# Patient Record
Sex: Female | Born: 1990 | Race: White | Hispanic: No | Marital: Married | Smoking: Former smoker
Health system: Southern US, Community
[De-identification: ages and names within clinical notes are randomized; demographics above are authoritative.]

## PROBLEM LIST (undated history)

## (undated) ENCOUNTER — Inpatient Hospital Stay (HOSPITAL_COMMUNITY): Payer: Self-pay

## (undated) DIAGNOSIS — E669 Obesity, unspecified: Secondary | ICD-10-CM

## (undated) DIAGNOSIS — E039 Hypothyroidism, unspecified: Secondary | ICD-10-CM

## (undated) DIAGNOSIS — F53 Postpartum depression: Secondary | ICD-10-CM

## (undated) DIAGNOSIS — O24419 Gestational diabetes mellitus in pregnancy, unspecified control: Secondary | ICD-10-CM

## (undated) DIAGNOSIS — Z720 Tobacco use: Secondary | ICD-10-CM

## (undated) DIAGNOSIS — R0600 Dyspnea, unspecified: Secondary | ICD-10-CM

## (undated) DIAGNOSIS — A4902 Methicillin resistant Staphylococcus aureus infection, unspecified site: Secondary | ICD-10-CM

## (undated) DIAGNOSIS — Z87898 Personal history of other specified conditions: Secondary | ICD-10-CM

## (undated) DIAGNOSIS — L709 Acne, unspecified: Secondary | ICD-10-CM

## (undated) DIAGNOSIS — D179 Benign lipomatous neoplasm, unspecified: Secondary | ICD-10-CM

## (undated) DIAGNOSIS — E119 Type 2 diabetes mellitus without complications: Secondary | ICD-10-CM

## (undated) DIAGNOSIS — F341 Dysthymic disorder: Secondary | ICD-10-CM

## (undated) DIAGNOSIS — K219 Gastro-esophageal reflux disease without esophagitis: Secondary | ICD-10-CM

## (undated) DIAGNOSIS — C569 Malignant neoplasm of unspecified ovary: Secondary | ICD-10-CM

## (undated) DIAGNOSIS — I10 Essential (primary) hypertension: Secondary | ICD-10-CM

## (undated) DIAGNOSIS — O99345 Other mental disorders complicating the puerperium: Secondary | ICD-10-CM

## (undated) DIAGNOSIS — N921 Excessive and frequent menstruation with irregular cycle: Secondary | ICD-10-CM

## (undated) DIAGNOSIS — C801 Malignant (primary) neoplasm, unspecified: Secondary | ICD-10-CM

## (undated) DIAGNOSIS — R002 Palpitations: Secondary | ICD-10-CM

## (undated) DIAGNOSIS — F419 Anxiety disorder, unspecified: Secondary | ICD-10-CM

## (undated) DIAGNOSIS — E282 Polycystic ovarian syndrome: Secondary | ICD-10-CM

## (undated) HISTORY — DX: Gastro-esophageal reflux disease without esophagitis: K21.9

## (undated) HISTORY — DX: Personal history of other specified conditions: Z87.898

## (undated) HISTORY — DX: Obesity, unspecified: E66.9

## (undated) HISTORY — PX: MOUTH SURGERY: SHX715

## (undated) HISTORY — DX: Acne, unspecified: L70.9

## (undated) HISTORY — DX: Postpartum depression: F53.0

## (undated) HISTORY — DX: Type 2 diabetes mellitus without complications: E11.9

## (undated) HISTORY — PX: TONSILLECTOMY: SUR1361

## (undated) HISTORY — DX: Other mental disorders complicating the puerperium: O99.345

## (undated) HISTORY — DX: Gestational diabetes mellitus in pregnancy, unspecified control: O24.419

## (undated) HISTORY — DX: Tobacco use: Z72.0

## (undated) HISTORY — DX: Dysthymic disorder: F34.1

## (undated) HISTORY — DX: Benign lipomatous neoplasm, unspecified: D17.9

## (undated) HISTORY — DX: Anxiety disorder, unspecified: F41.9

## (undated) HISTORY — DX: Excessive and frequent menstruation with irregular cycle: N92.1

## (undated) HISTORY — DX: Polycystic ovarian syndrome: E28.2

## (undated) HISTORY — DX: Palpitations: R00.2

---
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
## 2009-02-26 ENCOUNTER — Emergency Department (HOSPITAL_COMMUNITY): Admission: EM | Admit: 2009-02-26 | Discharge: 2009-02-26 | Payer: Self-pay | Admitting: Emergency Medicine

## 2009-09-11 ENCOUNTER — Inpatient Hospital Stay (HOSPITAL_COMMUNITY): Admission: AD | Admit: 2009-09-11 | Discharge: 2009-09-11 | Payer: Self-pay | Admitting: Family Medicine

## 2009-09-19 ENCOUNTER — Inpatient Hospital Stay (HOSPITAL_COMMUNITY): Admission: AD | Admit: 2009-09-19 | Discharge: 2009-09-19 | Payer: Self-pay | Admitting: Obstetrics & Gynecology

## 2009-09-19 ENCOUNTER — Ambulatory Visit: Payer: Self-pay | Admitting: Family Medicine

## 2009-09-20 ENCOUNTER — Ambulatory Visit: Payer: Self-pay | Admitting: Family

## 2009-09-20 ENCOUNTER — Inpatient Hospital Stay (HOSPITAL_COMMUNITY): Admission: AD | Admit: 2009-09-20 | Discharge: 2009-09-22 | Payer: Self-pay | Admitting: Obstetrics & Gynecology

## 2010-08-22 ENCOUNTER — Encounter (HOSPITAL_COMMUNITY): Payer: Self-pay

## 2010-08-22 ENCOUNTER — Emergency Department (HOSPITAL_COMMUNITY)
Admission: EM | Admit: 2010-08-22 | Discharge: 2010-08-23 | Disposition: A | Discharge status: Home / Self Care | Payer: BC Managed Care – PPO | Attending: Emergency Medicine | Admitting: Emergency Medicine

## 2010-08-22 ENCOUNTER — Emergency Department (HOSPITAL_COMMUNITY)
Admit: 2010-08-22 | Discharge: 2010-08-22 | Disposition: A | Discharge status: Home / Self Care | Payer: BC Managed Care – PPO

## 2010-08-22 ENCOUNTER — Encounter (INDEPENDENT_AMBULATORY_CARE_PROVIDER_SITE_OTHER): Payer: Self-pay | Admitting: *Deleted

## 2010-08-22 DIAGNOSIS — R42 Dizziness and giddiness: Secondary | ICD-10-CM | POA: Insufficient documentation

## 2010-08-22 DIAGNOSIS — R079 Chest pain, unspecified: Secondary | ICD-10-CM | POA: Insufficient documentation

## 2010-08-22 LAB — CONVERTED CEMR LAB
Basophils Absolute: 0 10*3/uL
Basophils Relative: 0 %
Creatinine, Ser: 0.69 mg/dL
GFR calc non Af Amer: >60 mL/min
Hemoglobin: 12.8 g/dL
Lymphocytes Relative: 29 %
Lymphs Abs: 2.1 10*3/uL
MCV: 86.8 fL
Monocytes Absolute: 0.8 10*3/uL
Monocytes Relative: 10 %
Neutro Abs: 4.3 10*3/uL
Potassium: 3.8 meq/L
RBC: 4.09 M/uL
RDW: 12.5 %
Troponin I: <0.05 ng/mL

## 2010-08-22 LAB — DIFFERENTIAL
Basophils Absolute: 0 10*3/uL (ref 0.0–0.1)
Eosinophils Absolute: 0.1 10*3/uL (ref 0.0–0.7)
Eosinophils Relative: 2 % (ref 0–5)
Neutro Abs: 4.3 10*3/uL (ref 1.7–7.7)
Neutrophils Relative %: 59 % (ref 43–77)

## 2010-08-22 LAB — POCT CARDIAC MARKERS
CKMB, poc: <1 ng/mL — ABNORMAL LOW (ref 1.0–8.0)
Troponin i, poc: 0.08 ng/mL (ref 0.00–0.09)

## 2010-08-22 LAB — BASIC METABOLIC PANEL
BUN: 11 mg/dL (ref 6–23)
CO2: 25 mEq/L (ref 19–32)
Creatinine, Ser: 0.69 mg/dL (ref 0.4–1.2)
GFR calc Af Amer: >60 mL/min (ref >60)
GFR calc non Af Amer: >60 mL/min (ref >60)
Glucose, Bld: 98 mg/dL (ref 70–99)
Potassium: 3.8 mEq/L (ref 3.5–5.1)
Sodium: 140 mEq/L (ref 135–145)

## 2010-08-22 LAB — CBC
MCHC: 36.1 g/dL — ABNORMAL HIGH (ref 30.0–36.0)
WBC: 7.3 10*3/uL (ref 4.0–10.5)

## 2010-09-12 ENCOUNTER — Encounter (INDEPENDENT_AMBULATORY_CARE_PROVIDER_SITE_OTHER): Payer: Self-pay | Admitting: *Deleted

## 2010-09-14 ENCOUNTER — Encounter: Payer: Self-pay | Admitting: Cardiology

## 2010-09-14 ENCOUNTER — Ambulatory Visit (INDEPENDENT_AMBULATORY_CARE_PROVIDER_SITE_OTHER): Payer: BC Managed Care – PPO | Admitting: Cardiology

## 2010-09-14 DIAGNOSIS — R002 Palpitations: Secondary | ICD-10-CM

## 2010-09-14 DIAGNOSIS — K219 Gastro-esophageal reflux disease without esophagitis: Secondary | ICD-10-CM | POA: Insufficient documentation

## 2010-09-14 DIAGNOSIS — R079 Chest pain, unspecified: Secondary | ICD-10-CM

## 2010-09-14 LAB — CONVERTED CEMR LAB: TSH: 1.726 microintl units/mL (ref 0.350–4.500)

## 2010-09-17 ENCOUNTER — Encounter: Payer: Self-pay | Admitting: Cardiology

## 2010-09-18 NOTE — Miscellaneous (Signed)
Summary: hospital labs 08/22/2010  Clinical Lists Changes  Observations: Added new observation of TROPONIN I: <0.05 (08/22/2010 15:22) Added new observation of CK-MB ISOENZ: <1.0 (08/22/2010 15:22) Added new observation of CALCIUM: 9.3 mg/dL (16/04/9603 54:09) Added new observation of GFR AA: >60 mL/min/1.75m2 (08/22/2010 15:22) Added new observation of GFR: >60 mL/min (08/22/2010 15:22) Added new observation of CREATININE: 0.69 mg/dL (81/19/1478 29:56) Added new observation of BUN: 11 mg/dL (21/30/8657 84:69) Added new observation of BG RANDOM: 98 mg/dL (62/95/2841 32:44) Added new observation of CO2 PLSM/SER: 25 meq/L (08/22/2010 15:22) Added new observation of CL SERUM: 107 meq/L (08/22/2010 15:22) Added new observation of K SERUM: 3.8 meq/L (08/22/2010 15:22) Added new observation of NA: 140 meq/L (08/22/2010 15:22) Added new observation of ABSOLUTE BAS: 0.0 K/uL (08/22/2010 15:22) Added new observation of BASOPHIL %: 0 % (08/22/2010 15:22) Added new observation of EOS ABSLT: 0.1 K/uL (08/22/2010 15:22) Added new observation of % EOS AUTO: 2 % (08/22/2010 15:22) Added new observation of ABSOLUTE MON: 0.8 K/uL (08/22/2010 15:22) Added new observation of MONOCYTE %: 10 % (08/22/2010 15:22) Added new observation of ABS LYMPHOCY: 2.1 K/uL (08/22/2010 15:22) Added new observation of LYMPHS %: 29 % (08/22/2010 15:22) Added new observation of ABS NEUTROPH: 4.3 K/uL (08/22/2010 15:22) Added new observation of PLATELETK/UL: 228 K/uL (08/22/2010 15:22) Added new observation of RDW: 12.5 % (08/22/2010 15:22) Added new observation of MCHC RBC: 36.1 g/dL (07/24/7251 66:44) Added new observation of MCV: 86.8 fL (08/22/2010 15:22) Added new observation of HCT: 35.5 % (08/22/2010 15:22) Added new observation of HGB: 12.8 g/dL (03/47/4259 56:38) Added new observation of RBC M/UL: 4.09 M/uL (08/22/2010 15:22) Added new observation of WBC COUNT: 7.3 10*3/microliter (08/22/2010 15:22)

## 2010-09-24 ENCOUNTER — Telehealth (INDEPENDENT_AMBULATORY_CARE_PROVIDER_SITE_OTHER): Payer: Self-pay | Admitting: *Deleted

## 2010-09-25 ENCOUNTER — Ambulatory Visit (HOSPITAL_COMMUNITY)
Admission: RE | Admit: 2010-09-25 | Discharge: 2010-09-25 | Disposition: A | Discharge status: Home / Self Care | Payer: BC Managed Care – PPO | Source: Ambulatory Visit | Attending: Cardiology | Admitting: Cardiology

## 2010-09-25 ENCOUNTER — Encounter (HOSPITAL_COMMUNITY): Payer: BC Managed Care – PPO

## 2010-09-25 DIAGNOSIS — R002 Palpitations: Secondary | ICD-10-CM

## 2010-09-25 DIAGNOSIS — R079 Chest pain, unspecified: Secondary | ICD-10-CM | POA: Insufficient documentation

## 2010-09-25 DIAGNOSIS — R072 Precordial pain: Secondary | ICD-10-CM

## 2010-09-26 ENCOUNTER — Ambulatory Visit (HOSPITAL_COMMUNITY)
Admission: RE | Admit: 2010-09-26 | Discharge: 2010-09-26 | Disposition: A | Discharge status: Home / Self Care | Payer: BC Managed Care – PPO | Source: Ambulatory Visit | Attending: Family Medicine | Admitting: Family Medicine

## 2010-09-26 DIAGNOSIS — R002 Palpitations: Secondary | ICD-10-CM | POA: Insufficient documentation

## 2010-09-26 DIAGNOSIS — R079 Chest pain, unspecified: Secondary | ICD-10-CM | POA: Insufficient documentation

## 2010-09-27 NOTE — Assessment & Plan Note (Signed)
Summary: [facility]   Visit Type:  Initial Consult Referring Provider:  [facility] Primary Provider:  Dr. [name]   History of Present Illness: [name] is seen at the kind request of Dr. [name] for the evaluation of chest discomfort and palpitations. This nice young woman delivered a child one year ago with no difficulties or complications.  Over the past few months, she has noted episodes of chest tightness associated with palpitations.  On one occasion, she measured a pulse rate of 174 beats per minute.  She notes mild chronic dyspnea on exertion and minimal ankle edema.  Current Medications (verified): 1)  Sodium Bicarbonate 325 Mg Tabs (Sodium Bicarbonate) .... Take 1 Tab Two Times A Day 2)  Omeprazole 20 Mg Cpdr (Omeprazole) .... 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  Past History:  Family History: Last updated: 09/12/2010 Father:unknown Mother:unknown patient was adopted  Social History: Last updated: 09/14/2010 Single with a 6-year-old child Employment-CNA at [employer] Tobacco Use - 5 pack years; essentially discontinued in 2011  Alcohol Use - no Regular Exercise - no Drug Use - no  Past Medical History: Chest pain Palpitations Tobacco abuse-5 pack years; essentially discontinued one year ago; occasionally smokes part of one cigarette Gastroesophageal reflux disease Dysthymic disorder-overdose at age 41 Overweight Acne  Past Surgical History: Tonsillectomy-1998 Skin graft-2009  EKG  Procedure date:  09/14/2010  Findings:      Normal sinus rhythm Delayed R-wave progression Prominent QRS voltage Otherwise unremarkable.   Social History: Single with a 74-year-old child Employment-CNA at [employer] Tobacco Use - 5 pack years; essentially discontinued in 2011  Alcohol Use - no Regular Exercise  - no Drug Use - no  Review of Systems       occasional dizziness; history of gastroesophageal reflux disease symptoms; progesterone implant for contraception; intermittent mild edema.  All other systems reviewed and are negative.  Vital Signs:  Patient profile:   20 year old female Height:      64 inches Weight:      200 pounds BMI:     34.45 Pulse rate:   99 / minute Pulse (ortho):   93 / minute BP sitting:   160 / 77  (left arm) BP standing:   138 / 81  Vitals Entered By: Dreama Saa, CNA (September 14, 2010 1:34 PM)  Serial Vital Signs/Assessments:  Time      Position  BP       Pulse  Resp  Temp     By 2:27 PM   Lying LA  149/82   83                    Lynn Via LPN 0:45 PM   Sitting   131/76   93                    Lynn Via LPN 4:09 PM   Standing  138/81   93                    Lynn Via LPN  Comments: 8:11 PM no complaints By: Larita Fife Via LPN    Physical Exam  General:  Obese Fat; well-developed; no acute distress: HEENT-[place]/AT; PERRL; EOM intact; conjunctiva and lids nl:  Neck-No JVD; no carotid bruits: Endocrine-No thyromegaly: Lungs-No tachypnea, clear without rales, rhonchi or wheezes:  CV-normal PMI; normal S1 and S2:;  Abdomen-BS normal; soft and non-tender without masses or organomegaly: MS-No deformities, cyanosis or clubbing: Neurologic-Nl cranial nerves; symmetric strength and tone: Skin- Warm, no sig. lesions: Extremities-Nl distal pulses; no edema    Impression & Recommendations:  Problem # 1:  CHEST PAIN (ICD-786.50) Likelihood of ischemic heart disease is remote.  We will initially evaluate for possible arrhythmia causing her chest discomfort.  Problem # 2:  PALPITATIONS (ICD-785.1) Patient reports that episodes occur daily.  A 2-day Holter monitor will be obtained.  Problem # 3:  VERTIGO (ICD-780.4) What patient describes as dizziness sounds most like vertigo.  These symptoms are mild at present, and do not require additional testing.  Other  Orders: Holter Monitor (Holter Monitor) 2-D Echocardiogram (2D Echo) T-TSH ([id number])  Patient Instructions: 1)  Your physician recommends that you schedule a follow-up appointment in: 2 weeks 2)  Your physician recommends that you return for lab work in: Today 3)  Your physician has requested that you have an echocardiogram.  Echocardiography is a painless test that uses sound waves to create images of your heart. It provides your doctor with information about the size and shape of your heart and how well your heart's chambers and valves are working.  This procedure takes approximately one hour. There are no restrictions for this procedure. 4)  Your physician has recommended that you wear a holter monitor.  Holter monitors are medical devices that record the heart's electrical activity. Doctors most often use these monitors to diagnose arrhythmias. Arrhythmias are problems with the speed or rhythm of the heartbeat. The monitor is a small, portable device. You can wear one while you do your normal daily activities. This is usually used to diagnose what is causing palpitations/syncope (passing out).

## 2010-09-28 ENCOUNTER — Encounter: Payer: Self-pay | Admitting: Cardiology

## 2010-10-02 NOTE — Progress Notes (Signed)
Summary: lab results  Phone Note Call from Patient Call back at Home Phone [phone]   Caller: pt Reason for Call: Talk to Nurse Summary of Call: pt calling for labs results done a week ago that she has not hear back on. Initial call taken by: Faythe Ghee,  September 24, 2010 11:36 AM  Follow-up for Phone Call        results of tsh given to pt, verbalized understanding Follow-up by: Teressa Lower RN,  September 24, 2010 12:00 PM

## 2010-10-02 NOTE — Procedures (Addendum)
Summary: Holter and Event  Holter and Event   Imported By: Faythe Ghee 09/28/2010 08:49:35  _____________________________________________________________________  External Attachment:    Type:   Image     Comment:   External Document

## 2010-10-05 ENCOUNTER — Ambulatory Visit: Payer: BC Managed Care – PPO | Admitting: Cardiology

## 2010-10-15 LAB — CBC
HCT: 30.2 % — ABNORMAL LOW (ref 36.0–46.0)
HCT: 36.3 % (ref 36.0–46.0)
MCV: 91 fL (ref 78.0–100.0)
RBC: 3.99 MIL/uL (ref 3.87–5.11)
RDW: 12.4 % (ref 11.5–15.5)

## 2010-10-24 ENCOUNTER — Telehealth: Payer: Self-pay | Admitting: Cardiology

## 2010-10-24 ENCOUNTER — Encounter: Payer: Self-pay | Admitting: Cardiology

## 2010-10-24 ENCOUNTER — Ambulatory Visit: Payer: BC Managed Care – PPO | Admitting: Cardiology

## 2010-10-24 NOTE — Telephone Encounter (Signed)
PT WANT TO KNOW IF WE CAN SEND HER RESULTS TO PCP DOC TO GO OVER WITH HER INSTEAD OF COMING HERE. SHE GOES TO DR Gerda Diss

## 2010-10-25 NOTE — Telephone Encounter (Signed)
Sent all information to Dr. [name] , pt prefers that he give her the results;ie, echo and holter monitor

## 2010-10-25 NOTE — Telephone Encounter (Signed)
Faxed results of holter and echo to Dr. [name], pt requests that he give her the results

## 2010-10-27 LAB — URINALYSIS, ROUTINE W REFLEX MICROSCOPIC
Bilirubin Urine: NEGATIVE
Glucose, UA: NEGATIVE mg/dL
Ketones, ur: NEGATIVE mg/dL
Nitrite: NEGATIVE
Specific Gravity, Urine: >1.03 — ABNORMAL HIGH (ref 1.005–1.030)
Urobilinogen, UA: 0.2 mg/dL (ref 0.0–1.0)
pH: 6 (ref 5.0–8.0)

## 2010-10-29 ENCOUNTER — Telehealth: Payer: Self-pay | Admitting: *Deleted

## 2010-10-29 NOTE — Telephone Encounter (Signed)
Faxed test results to Dr. Fletcher Anon office

## 2010-11-01 ENCOUNTER — Telehealth: Payer: Self-pay | Admitting: *Deleted

## 2010-11-01 NOTE — Telephone Encounter (Signed)
Spoke with Dr. Dietrich Pates, faxed all reports to Dr. Fletcher Anon office

## 2010-11-14 ENCOUNTER — Emergency Department (HOSPITAL_COMMUNITY)
Admission: EM | Admit: 2010-11-14 | Discharge: 2010-11-14 | Disposition: A | Discharge status: Home / Self Care | Payer: BC Managed Care – PPO | Attending: Emergency Medicine | Admitting: Emergency Medicine

## 2010-11-14 DIAGNOSIS — R079 Chest pain, unspecified: Secondary | ICD-10-CM | POA: Insufficient documentation

## 2010-11-14 DIAGNOSIS — R509 Fever, unspecified: Secondary | ICD-10-CM | POA: Insufficient documentation

## 2010-11-14 DIAGNOSIS — IMO0001 Reserved for inherently not codable concepts without codable children: Secondary | ICD-10-CM | POA: Insufficient documentation

## 2010-11-14 DIAGNOSIS — R0989 Other specified symptoms and signs involving the circulatory and respiratory systems: Secondary | ICD-10-CM | POA: Insufficient documentation

## 2010-11-14 DIAGNOSIS — J111 Influenza due to unidentified influenza virus with other respiratory manifestations: Secondary | ICD-10-CM | POA: Insufficient documentation

## 2010-11-14 DIAGNOSIS — R070 Pain in throat: Secondary | ICD-10-CM | POA: Insufficient documentation

## 2010-11-14 DIAGNOSIS — K644 Residual hemorrhoidal skin tags: Secondary | ICD-10-CM | POA: Insufficient documentation

## 2010-11-14 DIAGNOSIS — J3489 Other specified disorders of nose and nasal sinuses: Secondary | ICD-10-CM | POA: Insufficient documentation

## 2010-11-14 DIAGNOSIS — R0609 Other forms of dyspnea: Secondary | ICD-10-CM | POA: Insufficient documentation
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
## 2011-03-29 ENCOUNTER — Other Ambulatory Visit: Payer: Self-pay | Admitting: Family Medicine

## 2011-03-30 ENCOUNTER — Ambulatory Visit (HOSPITAL_COMMUNITY)
Admission: RE | Admit: 2011-03-30 | Discharge: 2011-03-30 | Disposition: A | Discharge status: Home / Self Care | Payer: BC Managed Care – PPO | Source: Ambulatory Visit | Attending: Family Medicine | Admitting: Family Medicine

## 2011-03-30 DIAGNOSIS — R109 Unspecified abdominal pain: Secondary | ICD-10-CM | POA: Insufficient documentation

## 2011-04-03 ENCOUNTER — Other Ambulatory Visit: Payer: Self-pay | Admitting: Family Medicine

## 2011-04-08 ENCOUNTER — Encounter (HOSPITAL_COMMUNITY): Payer: BC Managed Care – PPO

## 2011-05-02 LAB — BASIC METABOLIC PANEL
BUN: 3 — ABNORMAL LOW
Calcium: 9.2
Creatinine, Ser: 0.66
Glucose, Bld: 117 — ABNORMAL HIGH
Potassium: 3.9

## 2011-05-02 LAB — URINE MICROSCOPIC-ADD ON

## 2011-05-02 LAB — URINALYSIS, ROUTINE W REFLEX MICROSCOPIC
Bilirubin Urine: NEGATIVE
Glucose, UA: NEGATIVE
Hgb urine dipstick: NEGATIVE
Ketones, ur: NEGATIVE
pH: 7

## 2011-05-02 LAB — CBC
Hemoglobin: 12.7
MCV: 85
RBC: 4.34
RDW: 12.4

## 2011-05-02 LAB — RAPID URINE DRUG SCREEN, HOSP PERFORMED
Barbiturates: NOT DETECTED
Benzodiazepines: NOT DETECTED
Cocaine: NOT DETECTED

## 2011-05-02 LAB — DIFFERENTIAL
Basophils Relative: 1
Eosinophils Relative: 2
Lymphocytes Relative: 31
Lymphs Abs: 3.3

## 2011-05-02 LAB — SALICYLATE LEVEL: Salicylate Lvl: <4

## 2011-05-02 LAB — ACETAMINOPHEN LEVEL: Acetaminophen (Tylenol), Serum: <10 — ABNORMAL LOW

## 2011-05-02 LAB — ETHANOL: Alcohol, Ethyl (B): <5

## 2011-10-15 ENCOUNTER — Ambulatory Visit: Payer: BC Managed Care – PPO | Admitting: Urology

## 2012-02-24 ENCOUNTER — Other Ambulatory Visit: Payer: Self-pay | Admitting: Family Medicine

## 2012-02-24 ENCOUNTER — Ambulatory Visit (HOSPITAL_COMMUNITY)
Admission: RE | Admit: 2012-02-24 | Discharge: 2012-02-24 | Disposition: A | Discharge status: Home / Self Care | Payer: BC Managed Care – PPO | Source: Ambulatory Visit | Attending: Family Medicine | Admitting: Family Medicine

## 2012-02-24 DIAGNOSIS — R1011 Right upper quadrant pain: Secondary | ICD-10-CM

## 2012-02-25 ENCOUNTER — Encounter (HOSPITAL_COMMUNITY): Payer: Self-pay

## 2012-02-25 ENCOUNTER — Ambulatory Visit (HOSPITAL_COMMUNITY)
Admission: RE | Admit: 2012-02-25 | Discharge: 2012-02-25 | Disposition: A | Discharge status: Home / Self Care | Payer: BC Managed Care – PPO | Source: Ambulatory Visit | Attending: Family Medicine | Admitting: Family Medicine

## 2012-02-25 DIAGNOSIS — R109 Unspecified abdominal pain: Secondary | ICD-10-CM | POA: Insufficient documentation

## 2012-02-25 DIAGNOSIS — R932 Abnormal findings on diagnostic imaging of liver and biliary tract: Secondary | ICD-10-CM | POA: Insufficient documentation

## 2012-02-25 MED ORDER — AMPICILLIN SODIUM 125 MG IJ SOLR
INTRAMUSCULAR | Status: AC
  Filled 2012-02-25: qty 125

## 2012-02-25 MED ORDER — TECHNETIUM TC 99M MEBROFENIN IV KIT
5.0000 | PACK | Freq: Once | INTRAVENOUS | Status: AC | PRN
  Administered 2012-02-25: 4.8 via INTRAVENOUS

## 2012-03-03 ENCOUNTER — Encounter (HOSPITAL_COMMUNITY): Payer: Self-pay | Admitting: Pharmacy Technician

## 2012-03-04 ENCOUNTER — Encounter (HOSPITAL_COMMUNITY)
Admission: RE | Admit: 2012-03-04 | Discharge: 2012-03-04 | Disposition: A | Discharge status: Home / Self Care | Payer: BC Managed Care – PPO | Source: Ambulatory Visit | Attending: General Surgery | Admitting: General Surgery

## 2012-03-04 ENCOUNTER — Encounter (HOSPITAL_COMMUNITY): Payer: Self-pay

## 2012-03-04 LAB — CBC WITH DIFFERENTIAL/PLATELET
Basophils Relative: 0 % (ref 0–1)
Eosinophils Absolute: 0.2 10*3/uL (ref 0.0–0.7)
HCT: 39.5 % (ref 36.0–46.0)
MCV: 87 fL (ref 78.0–100.0)
Monocytes Absolute: 0.8 10*3/uL (ref 0.1–1.0)
Monocytes Relative: 7 % (ref 3–12)
Neutrophils Relative %: 58 % (ref 43–77)
Platelets: 345 10*3/uL (ref 150–400)
RBC: 4.54 MIL/uL (ref 3.87–5.11)
WBC: 10.6 10*3/uL — ABNORMAL HIGH (ref 4.0–10.5)

## 2012-03-04 LAB — HCG, SERUM, QUALITATIVE: Preg, Serum: NEGATIVE

## 2012-03-04 LAB — SURGICAL PCR SCREEN: Staphylococcus aureus: POSITIVE — AB

## 2012-03-04 LAB — BASIC METABOLIC PANEL
CO2: 25 mEq/L (ref 19–32)
Calcium: 9.9 mg/dL (ref 8.4–10.5)
Chloride: 103 mEq/L (ref 96–112)
GFR calc Af Amer: >90 mL/min (ref >90)
Sodium: 138 mEq/L (ref 135–145)

## 2012-03-04 NOTE — Patient Instructions (Addendum)
[address]  03/04/2012   Your procedure is scheduled on: Friday, 03/06/12  Report to [facility] at 0700 AM.  Call this number if you have problems the morning of surgery: [phone]   Remember:   Do not eat food:After Midnight.  May have clear liquids:until Midnight .  Clear liquids include soda, tea, black coffee, apple or grape juice, broth.  Take these medicines the morning of surgery with A SIP OF WATER: none   Do not wear jewelry, make-up or nail polish.  Do not wear lotions, powders, or perfumes. You may wear deodorant.  Do not shave 48 hours prior to surgery. Men may shave face and neck.  Do not bring valuables to the hospital.  Contacts, dentures or bridgework may not be worn into surgery.  Leave suitcase in the car. After surgery it may be brought to your room.  For patients admitted to the hospital, checkout time is 11:00 AM the day of discharge.   Patients discharged the day of surgery will not be allowed to drive home.  Name and phone number of your driver: family  Special Instructions: CHG Shower Use Special Wash: 1/2 bottle night before surgery and 1/2 bottle morning of surgery.   Please read over the following fact sheets that you were given: Pain Booklet, Coughing and Deep Breathing, MRSA Information, Surgical Site Infection Prevention, Anesthesia Post-op Instructions and Care and Recovery After Surgery   Laparoscopic Cholecystectomy Laparoscopic cholecystectomy is surgery to remove the gallbladder. The gallbladder is located slightly to the right of center in the abdomen, behind the liver. It is a concentrating and storage sac for the bile produced in the liver. Bile aids in the digestion and absorption of fats. Gallbladder disease (cholecystitis) is an inflammation of your gallbladder. This condition is usually caused by a buildup of gallstones (cholelithiasis) in your gallbladder. Gallstones can block the flow of bile, resulting in inflammation and pain. In severe  cases, emergency surgery may be required. When emergency surgery is not required, you will have time to prepare for the procedure. Laparoscopic surgery is an alternative to open surgery. Laparoscopic surgery usually has a shorter recovery time. Your common bile duct may also need to be examined and explored. Your caregiver will discuss this with you if he or she feels this should be done. If stones are found in the common bile duct, they may be removed. LET YOUR CAREGIVER KNOW ABOUT:  Allergies to food or medicine.   Medicines taken, including vitamins, herbs, eyedrops, over-the-counter medicines, and creams.   Use of steroids (by mouth or creams).   Previous problems with anesthetics or numbing medicines.   History of bleeding problems or blood clots.   Previous surgery.   Other health problems, including diabetes and kidney problems.   Possibility of pregnancy, if this applies.  RISKS AND COMPLICATIONS All surgery is associated with risks. Some problems that may occur following this procedure include:  Infection.   Damage to the common bile duct, nerves, arteries, veins, or other internal organs such as the stomach or intestines.   Bleeding.   A stone may remain in the common bile duct.  BEFORE THE PROCEDURE  Do not take aspirin for 3 days prior to surgery or blood thinners for 1 week prior to surgery.   Do not eat or drink anything after midnight the night before surgery.   Let your caregiver know if you develop a cold or other infectious problem prior to surgery.   You should be  present 60 minutes before the procedure or as directed.  PROCEDURE  You will be given medicine that makes you sleep (general anesthetic). When you are asleep, your surgeon will make several small cuts (incisions) in your abdomen. One of these incisions is used to insert a small, lighted scope (laparoscope) into the abdomen. The laparoscope helps the surgeon see into your abdomen. Carbon dioxide gas  will be pumped into your abdomen. The gas allows more room for the surgeon to perform your surgery. Other operating instruments are inserted through the other incisions. Laparoscopic procedures may not be appropriate when:  There is major scarring from previous surgery.   The gallbladder is extremely inflamed.   There are bleeding disorders or unexpected cirrhosis of the liver.   A pregnancy is near term.   Other conditions make the laparoscopic procedure impossible.  If your surgeon feels it is not safe to continue with a laparoscopic procedure, he or she will perform an open abdominal procedure. In this case, the surgeon will make an incision to open the abdomen. This gives the surgeon a larger view and field to work within. This may allow the surgeon to perform procedures that sometimes cannot be performed with a laparoscope alone. Open surgery has a longer recovery time. AFTER THE PROCEDURE  You will be taken to the recovery area where a nurse will watch and check your progress.   You may be allowed to go home the same day.   Do not resume physical activities until directed by your caregiver.   You may resume a normal diet and activities as directed.  Document Released: 07/08/2005 Document Revised: 06/27/2011 Document Reviewed: 12/21/2010 [facility] Patient Information 2012 [place], [place].  Laparoscopic Cholecystectomy Care After These instructions give you information on caring for yourself after your procedure. Your doctor may also give you more specific instructions. Call your doctor if you have any problems or questions after your procedure. HOME CARE  Change your bandages (dressings) as told by your doctor.   Keep the wound dry and clean. Wash the wound gently with soap and water. Pat the wound dry with a clean towel.   Do not take baths, swim, or use hot tubs for 10 days, or as told by your doctor.   Only take medicine as told by your doctor.   Eat a normal diet as told  by your doctor.   Do not lift anything heavier than 25 pounds (11.5 kg), or as told by your doctor.   Do not play contact sports for 1 week, or as told by your doctor.  GET HELP RIGHT AWAY IF:   Your wound is red, puffy (swollen), or painful.   You have yellowish-white fluid (pus) coming from the wound.   You have fluid draining from the wound for more than 1 day.   You have a bad smell coming from the wound.   Your wound breaks open.   You have a rash.   You have trouble breathing.   You have chest pain.   You have a bad reaction to your medicine.   You have a fever.   You have pain in the shoulders (shoulder strap areas).   You feel dizzy or pass out (faint).   You have severe belly (abdominal) pain.   You feel sick to your stomach (nauseous) or throw up (vomit) for more than 1 day.  MAKE SURE YOU:  Understand these instructions.   Will watch your condition.   Will get help right away  if you are not doing well or get worse.  Document Released: 04/16/2008 Document Revised: 06/27/2011 Document Reviewed: 12/25/2010 [facility] Patient Information 2012 [place], [place].

## 2012-03-06 ENCOUNTER — Encounter (HOSPITAL_COMMUNITY): Payer: Self-pay | Admitting: *Deleted

## 2012-03-06 ENCOUNTER — Encounter (HOSPITAL_COMMUNITY)
Admission: RE | Disposition: A | Discharge status: Home / Self Care | Payer: Self-pay | Source: Ambulatory Visit | Attending: General Surgery

## 2012-03-06 ENCOUNTER — Encounter (HOSPITAL_COMMUNITY): Payer: Self-pay | Admitting: Anesthesiology

## 2012-03-06 ENCOUNTER — Ambulatory Visit (HOSPITAL_COMMUNITY)
Admission: RE | Admit: 2012-03-06 | Discharge: 2012-03-06 | Disposition: A | Discharge status: Home / Self Care | Payer: BC Managed Care – PPO | Source: Ambulatory Visit | Attending: General Surgery | Admitting: General Surgery

## 2012-03-06 ENCOUNTER — Ambulatory Visit (HOSPITAL_COMMUNITY): Payer: BC Managed Care – PPO | Admitting: Anesthesiology

## 2012-03-06 DIAGNOSIS — K811 Chronic cholecystitis: Secondary | ICD-10-CM | POA: Insufficient documentation

## 2012-03-06 DIAGNOSIS — K828 Other specified diseases of gallbladder: Secondary | ICD-10-CM

## 2012-03-06 HISTORY — PX: CHOLECYSTECTOMY: SHX55

## 2012-03-06 SURGERY — LAPAROSCOPIC CHOLECYSTECTOMY
Anesthesia: General | Site: Abdomen | Wound class: Contaminated

## 2012-03-06 MED ORDER — CELECOXIB 100 MG PO CAPS
ORAL_CAPSULE | ORAL | Status: AC
  Filled 2012-03-06: qty 4

## 2012-03-06 MED ORDER — ONDANSETRON HCL 4 MG/2ML IJ SOLN
INTRAMUSCULAR | Status: AC
  Filled 2012-03-06: qty 2

## 2012-03-06 MED ORDER — GLYCOPYRROLATE 0.2 MG/ML IJ SOLN
INTRAMUSCULAR | Status: AC
  Filled 2012-03-06: qty 1

## 2012-03-06 MED ORDER — GLYCOPYRROLATE 0.2 MG/ML IJ SOLN
INTRAMUSCULAR | Status: DC | PRN
  Administered 2012-03-06: 0.2 mg via INTRAVENOUS

## 2012-03-06 MED ORDER — ROCURONIUM BROMIDE 100 MG/10ML IV SOLN
INTRAVENOUS | Status: DC | PRN
  Administered 2012-03-06: 30 mg via INTRAVENOUS

## 2012-03-06 MED ORDER — LIDOCAINE HCL 1 % IJ SOLN
INTRAMUSCULAR | Status: DC | PRN
  Administered 2012-03-06: 50 mg via INTRADERMAL

## 2012-03-06 MED ORDER — MIDAZOLAM HCL 2 MG/2ML IJ SOLN
INTRAMUSCULAR | Status: AC
  Filled 2012-03-06: qty 2

## 2012-03-06 MED ORDER — ONDANSETRON HCL 4 MG/2ML IJ SOLN
4.0000 mg | Freq: Once | INTRAMUSCULAR | Status: AC
  Administered 2012-03-06: 4 mg via INTRAVENOUS

## 2012-03-06 MED ORDER — CEFAZOLIN SODIUM-DEXTROSE 2-3 GM-% IV SOLR: 2.0000 g | INTRAVENOUS | Status: DC

## 2012-03-06 MED ORDER — LACTATED RINGERS IV SOLN
INTRAVENOUS | Status: DC
  Administered 2012-03-06: 1000 mL via INTRAVENOUS

## 2012-03-06 MED ORDER — MIDAZOLAM HCL 5 MG/5ML IJ SOLN
INTRAMUSCULAR | Status: DC | PRN
  Administered 2012-03-06: 2 mg via INTRAVENOUS

## 2012-03-06 MED ORDER — CELECOXIB 100 MG PO CAPS: 400.0000 mg | ORAL_CAPSULE | Freq: Every day | ORAL | Status: DC

## 2012-03-06 MED ORDER — FENTANYL CITRATE 0.05 MG/ML IJ SOLN
25.0000 ug | INTRAMUSCULAR | Status: DC | PRN
  Administered 2012-03-06 (×4): 50 ug via INTRAVENOUS

## 2012-03-06 MED ORDER — BUPIVACAINE HCL (PF) 0.5 % IJ SOLN
INTRAMUSCULAR | Status: AC
  Filled 2012-03-06: qty 30

## 2012-03-06 MED ORDER — BUPIVACAINE HCL (PF) 0.5 % IJ SOLN
INTRAMUSCULAR | Status: DC | PRN
  Administered 2012-03-06: 10 mL

## 2012-03-06 MED ORDER — FENTANYL CITRATE 0.05 MG/ML IJ SOLN
INTRAMUSCULAR | Status: AC
  Filled 2012-03-06: qty 5

## 2012-03-06 MED ORDER — ROCURONIUM BROMIDE 50 MG/5ML IV SOLN
INTRAVENOUS | Status: AC
  Filled 2012-03-06: qty 1

## 2012-03-06 MED ORDER — CEFAZOLIN SODIUM-DEXTROSE 2-3 GM-% IV SOLR
INTRAVENOUS | Status: AC
  Filled 2012-03-06: qty 50

## 2012-03-06 MED ORDER — PROPOFOL 10 MG/ML IV EMUL
INTRAVENOUS | Status: AC
  Filled 2012-03-06: qty 20

## 2012-03-06 MED ORDER — LIDOCAINE HCL (PF) 1 % IJ SOLN
INTRAMUSCULAR | Status: AC
  Filled 2012-03-06: qty 2

## 2012-03-06 MED ORDER — FENTANYL CITRATE 0.05 MG/ML IJ SOLN
INTRAMUSCULAR | Status: AC
  Filled 2012-03-06: qty 2

## 2012-03-06 MED ORDER — ENOXAPARIN SODIUM 40 MG/0.4ML ~~LOC~~ SOLN
40.0000 mg | Freq: Once | SUBCUTANEOUS | Status: AC
  Administered 2012-03-06: 40 mg via SUBCUTANEOUS

## 2012-03-06 MED ORDER — ONDANSETRON HCL 4 MG/2ML IJ SOLN
4.0000 mg | Freq: Once | INTRAMUSCULAR | Status: AC | PRN
  Administered 2012-03-06: 4 mg via INTRAVENOUS

## 2012-03-06 MED ORDER — ENOXAPARIN SODIUM 40 MG/0.4ML ~~LOC~~ SOLN
SUBCUTANEOUS | Status: AC
  Filled 2012-03-06: qty 0.4

## 2012-03-06 MED ORDER — CEFAZOLIN SODIUM-DEXTROSE 2-3 GM-% IV SOLR
INTRAVENOUS | Status: DC | PRN
  Administered 2012-03-06: 2 g via INTRAVENOUS

## 2012-03-06 MED ORDER — FENTANYL CITRATE 0.05 MG/ML IJ SOLN
INTRAMUSCULAR | Status: DC | PRN
  Administered 2012-03-06: 50 ug via INTRAVENOUS
  Administered 2012-03-06: 100 ug via INTRAVENOUS
  Administered 2012-03-06: 50 ug via INTRAVENOUS
  Administered 2012-03-06 (×2): 100 ug via INTRAVENOUS
  Administered 2012-03-06: 50 ug via INTRAVENOUS

## 2012-03-06 MED ORDER — HYDROMORPHONE HCL PF 1 MG/ML IJ SOLN
0.2500 mg | INTRAMUSCULAR | Status: DC | PRN
  Administered 2012-03-06: 1 mg via INTRAVENOUS
  Administered 2012-03-06 (×2): 0.5 mg via INTRAVENOUS

## 2012-03-06 MED ORDER — OXYCODONE-ACETAMINOPHEN 5-325 MG PO TABS: 1.0000 | ORAL_TABLET | ORAL | Status: AC | PRN

## 2012-03-06 MED ORDER — SODIUM CHLORIDE 0.9 % IR SOLN
Status: DC | PRN
  Administered 2012-03-06: 1000 mL

## 2012-03-06 MED ORDER — PROPOFOL 10 MG/ML IV BOLUS
INTRAVENOUS | Status: DC | PRN
  Administered 2012-03-06: 150 mg via INTRAVENOUS

## 2012-03-06 MED ORDER — GLYCOPYRROLATE 0.2 MG/ML IJ SOLN
0.2000 mg | Freq: Once | INTRAMUSCULAR | Status: AC
  Administered 2012-03-06: 0.2 mg via INTRAVENOUS

## 2012-03-06 MED ORDER — LIDOCAINE HCL (PF) 1 % IJ SOLN
INTRAMUSCULAR | Status: AC
  Filled 2012-03-06: qty 5

## 2012-03-06 MED ORDER — MIDAZOLAM HCL 2 MG/2ML IJ SOLN
1.0000 mg | INTRAMUSCULAR | Status: DC | PRN
  Administered 2012-03-06: 2 mg via INTRAVENOUS

## 2012-03-06 MED ORDER — HYDROMORPHONE HCL PF 1 MG/ML IJ SOLN
INTRAMUSCULAR | Status: AC
  Filled 2012-03-06: qty 1

## 2012-03-06 MED ORDER — NEOSTIGMINE METHYLSULFATE 1 MG/ML IJ SOLN
INTRAMUSCULAR | Status: DC | PRN
  Administered 2012-03-06: 3 mg via INTRAVENOUS

## 2012-03-06 SURGICAL SUPPLY — 40 items
APL SKNCLS STERI-STRIP NONHPOA (GAUZE/BANDAGES/DRESSINGS) ×1
APPLIER CLIP UNV 5X34 EPIX (ENDOMECHANICALS) ×2 IMPLANT
APR XCLPCLP 20M/L UNV 34X5 (ENDOMECHANICALS) ×1
BAG HAMPER (MISCELLANEOUS) ×2 IMPLANT
BAG SPEC RTRVL LRG 6X4 10 (ENDOMECHANICALS) ×1
BENZOIN TINCTURE PRP APPL 2/3 (GAUZE/BANDAGES/DRESSINGS) ×2 IMPLANT
CLOTH BEACON ORANGE TIMEOUT ST (SAFETY) ×2 IMPLANT
COVER LIGHT HANDLE STERIS (MISCELLANEOUS) ×4 IMPLANT
DECANTER SPIKE VIAL GLASS SM (MISCELLANEOUS) ×2 IMPLANT
DEVICE TROCAR PUNCTURE CLOSURE (ENDOMECHANICALS) ×2 IMPLANT
DURAPREP 26ML APPLICATOR (WOUND CARE) ×2 IMPLANT
ELECT REM PT RETURN 9FT ADLT (ELECTROSURGICAL) ×2
ELECTRODE REM PT RTRN 9FT ADLT (ELECTROSURGICAL) ×1 IMPLANT
FILTER SMOKE EVAC LAPAROSHD (FILTER) ×2 IMPLANT
FORMALIN 10 PREFIL 120ML (MISCELLANEOUS) ×2 IMPLANT
GLOVE BIOGEL PI IND STRL 7.0 (GLOVE) IMPLANT
GLOVE BIOGEL PI IND STRL 7.5 (GLOVE) ×1 IMPLANT
GLOVE BIOGEL PI INDICATOR 7.0 (GLOVE) ×2
GLOVE BIOGEL PI INDICATOR 7.5 (GLOVE) ×1
GLOVE ECLIPSE 6.5 STRL STRAW (GLOVE) ×4 IMPLANT
GLOVE ECLIPSE 7.0 STRL STRAW (GLOVE) ×2 IMPLANT
GOWN STRL REIN XL XLG (GOWN DISPOSABLE) ×6 IMPLANT
HEMOSTAT SNOW SURGICEL 2X4 (HEMOSTASIS) ×1 IMPLANT
INST SET LAPROSCOPIC AP (KITS) ×2 IMPLANT
IV NS IRRIG 3000ML ARTHROMATIC (IV SOLUTION) IMPLANT
KIT ROOM TURNOVER APOR (KITS) ×2 IMPLANT
MANIFOLD NEPTUNE II (INSTRUMENTS) ×2 IMPLANT
NEEDLE INSUFFLATION 14GA 120MM (NEEDLE) ×2 IMPLANT
PACK LAP CHOLE LZT030E (CUSTOM PROCEDURE TRAY) ×2 IMPLANT
PAD ARMBOARD 7.5X6 YLW CONV (MISCELLANEOUS) ×2 IMPLANT
POUCH SPECIMEN RETRIEVAL 10MM (ENDOMECHANICALS) ×2 IMPLANT
SET BASIN LINEN APH (SET/KITS/TRAYS/PACK) ×2 IMPLANT
SET TUBE IRRIG SUCTION NO TIP (IRRIGATION / IRRIGATOR) IMPLANT
SLEEVE Z-THREAD 5X100MM (TROCAR) ×4 IMPLANT
STRIP CLOSURE SKIN 1/2X4 (GAUZE/BANDAGES/DRESSINGS) ×3 IMPLANT
SUT MNCRL AB 4-0 PS2 18 (SUTURE) ×4 IMPLANT
SUT VIC AB 2-0 CT2 27 (SUTURE) ×2 IMPLANT
TROCAR Z-THRD FIOS HNDL 11X100 (TROCAR) ×2 IMPLANT
TROCAR Z-THREAD FIOS 5X100MM (TROCAR) ×2 IMPLANT
WARMER LAPAROSCOPE (MISCELLANEOUS) ×2 IMPLANT

## 2012-03-06 NOTE — Anesthesia Procedure Notes (Signed)
Procedure Name: Intubation Date/Time: 03/06/2012 9:18 AM Performed by: Despina Hidden Pre-anesthesia Checklist: Emergency Drugs available, Suction available, Patient identified and Patient being monitored Patient Re-evaluated:Patient Re-evaluated prior to inductionOxygen Delivery Method: Circle system utilized Preoxygenation: Pre-oxygenation with 100% oxygen Intubation Type: IV induction and Cricoid Pressure applied Ventilation: Mask ventilation without difficulty Laryngoscope Size: Mac and 3 Grade View: Grade II Tube type: Oral Tube size: 7.0 mm Number of attempts: 1 Airway Equipment and Method: Stylet Placement Confirmation: ETT inserted through vocal cords under direct vision,  positive ETCO2 and breath sounds checked- equal and bilateral Secured at: 22 cm Tube secured with: Tape Dental Injury: Teeth and Oropharynx as per pre-operative assessment

## 2012-03-06 NOTE — Addendum Note (Signed)
Addendum  created 03/06/12 1026 by Taydem Cavagnaro J Pearson Reasons, CRNA   Modules edited:Anesthesia Medication Administration    

## 2012-03-06 NOTE — Transfer of Care (Signed)
Immediate Anesthesia Transfer of Care Note  Patient: Marissa Lane  Procedure(s) Performed: Procedure(s) (LRB): LAPAROSCOPIC CHOLECYSTECTOMY (N/A)  Patient Location: PACU  Anesthesia Type: General  Level of Consciousness: awake and patient cooperative  Airway & Oxygen Therapy: Patient Spontanous Breathing and Patient connected to face mask oxygen  Post-op Assessment: Report given to PACU RN, Post -op Vital signs reviewed and stable and Patient moving all extremities  Post vital signs: Reviewed and stable  Complications: No apparent anesthesia complications

## 2012-03-06 NOTE — Interval H&P Note (Signed)
History and Physical Interval Note:  03/06/2012 8:54 AM  Marissa Lane  has presented today for surgery, with the diagnosis of biliary dykinesia  The various methods of treatment have been discussed with the patient and family. After consideration of risks, benefits and other options for treatment, the patient has consented to  Procedure(s) (LRB): LAPAROSCOPIC CHOLECYSTECTOMY (N/A) as a surgical intervention .  The patient's history has been reviewed, patient examined, no change in status, stable for surgery.  I have reviewed the patient's chart and labs.  Questions were answered to the patient's satisfaction.     Viann Nielson C

## 2012-03-06 NOTE — Progress Notes (Signed)
Awake. Crying. Uncooperative. C/O postop abd pain. Med as noted.

## 2012-03-06 NOTE — Addendum Note (Signed)
Addendum  created 03/06/12 1026 by Despina Hidden, CRNA   Modules edited:Anesthesia Medication Administration

## 2012-03-06 NOTE — H&P (Signed)
  NTS SOAP Note  Vital Signs:  Vitals as of: 03/03/2012: Systolic 121: Diastolic 79: Heart Rate 95: Temp 98.37F: Height 52ft 4in: Weight 190Lbs 0 Ounces: BMI 33  BMI : 32.61 kg/m2  Subjective: This 21 Years 0 Months old Female presents for of epigastric abdominal pain.  pain has been increasing over the last several months. She's had 2 notable episodes. She has had a history of heartburn. This does feel different. She has been worked up with no evidence of cholelithiasis. She denies any significant changes with the pain with food intake. She does feel gassy and bloated especially after fatty greasy foods. She denies any change in bowel movements. No melena or hematochezia. She has had some occasional loose stools especially with fatty greasy foods. She does have significant family history of biliary disease. She does have a history of Native American ancestry.  Review of Symptoms:  Constitutional:unremarkable      headaches :  blurred vision occasional eye pain Nose/Mouth/Throat:unremarkable Cardiovascular:  unremarkable   Respiratory:unremarkable       as per history of present illness     frequent urination   back pain Skin:unremarkable Breast:unremarkable       excessive thirst, frequently cold, sluggish Allergic/Immunologic:unremarkable     Past Medical History:  Obtained  Reviewed   Past Medical History  Pregnancy Gravida:  1 Pregnancy Para:  1 Allergies: NKDA Medications:  phenterimine   Social History:ObtainedReviewed  Social History  Age: 21 Years 0 Months Alcohol:  No Recreational drug(s):  No   Smoking Status: Current every day smoker reviewed on 11/22/2010 Started Date: 07/22/2006 Packs per day: 1.00   Family History:  Reviewed   Family History              Father:  Coronary Artery Disease    Objective Information: General:  Well appearing, well nourished in no distress. Skin:     no rash or  prominent lesions Head:Atraumatic; no masses; no abnormalities Eyes:  conjunctiva clear, EOM intact, PERRL Mouth:  Mucous membranes moist, no mucosal lesions. Neck:  Supple without lymphadenopathy.  Heart:  RRR, no murmur Lungs:    CTA bilaterally, no wheezes, rhonchi, rales.  Breathing unlabored. Abdomen:Soft, ND, no HSM, no masses.mild right upper quadrant tenderness. No classic Murphy sign. Extremities:  No deformities, clubbing, cyanosis, or edema.    HIDA scan: 16.5% ejection fraction. Some exacerbation of symptomatology.   Assessment:    Plan: biliary dyskinesia. Surgical options were discussed with patient. She will be consented and scheduled at her earliest convenience for planned laparoscopic possible open cholecystectomy.  Patient Education:Alternative treatments to surgery were discussed with patient (and family).  Risks and benefits  of procedure were fully explained to the patient (and family) who gave informed consent. Patient/family questions were addressed.  Follow-up:Pending Surgery

## 2012-03-06 NOTE — Anesthesia Postprocedure Evaluation (Signed)
  Anesthesia Post-op Note  Patient: Marissa Lane  Procedure(s) Performed: Procedure(s) (LRB): LAPAROSCOPIC CHOLECYSTECTOMY (N/A)  Patient Location: PACU  Anesthesia Type: General  Level of Consciousness: awake, alert , oriented and patient cooperative  Airway and Oxygen Therapy: Patient Spontanous Breathing  Post-op Pain: 2 /10, mild  Post-op Assessment: Post-op Vital signs reviewed, Patient's Cardiovascular Status Stable, Respiratory Function Stable, Patent Airway, No signs of Nausea or vomiting and Pain level controlled  Post-op Vital Signs: Reviewed and stable  Complications: No apparent anesthesia complications

## 2012-03-06 NOTE — Anesthesia Preprocedure Evaluation (Signed)
Anesthesia Evaluation  Patient identified by MRN, date of birth, ID band Patient awake    Reviewed: Allergy & Precautions, H&P , NPO status , Patient's Chart, lab work & pertinent test results  Airway Mallampati: I TM Distance: >3 FB Neck ROM: Full    Dental No notable dental hx. (+) Teeth Intact   Pulmonary Current Smoker,    Pulmonary exam normal       Cardiovascular negative cardio ROS  Rhythm:Regular Rate:Normal     Neuro/Psych PSYCHIATRIC DISORDERS Depression negative neurological ROS     GI/Hepatic Neg liver ROS, GERD-  Medicated and Controlled,  Endo/Other  negative endocrine ROS  Renal/GU negative Renal ROS  negative genitourinary   Musculoskeletal negative musculoskeletal ROS (+)   Abdominal Normal abdominal exam  (+)   Peds  Hematology negative hematology ROS (+)   Anesthesia Other Findings   Reproductive/Obstetrics negative OB ROS                           Anesthesia Physical Anesthesia Plan  ASA: II  Anesthesia Plan: General   Post-op Pain Management:    Induction: Intravenous  Airway Management Planned: Oral ETT  Additional Equipment:   Intra-op Plan:   Post-operative Plan: Extubation in OR  Informed Consent: I have reviewed the patients History and Physical, chart, labs and discussed the procedure including the risks, benefits and alternatives for the proposed anesthesia with the patient or authorized representative who has indicated his/her understanding and acceptance.   Dental advisory given  Plan Discussed with: CRNA  Anesthesia Plan Comments:         Anesthesia Quick Evaluation

## 2012-03-06 NOTE — Progress Notes (Signed)
Awake. Calm. Continues to be uncooperative at intervals. Needs to void. Wants to go to [facility]. Transferred to postop day surgery in good condition.

## 2012-03-06 NOTE — Progress Notes (Signed)
Awake. Crying. Restless yelling. Wants to see dad. Wants pain med. Coke given to drink. Med as noted.

## 2012-03-06 NOTE — Op Note (Signed)
Patient:  Marissa Lane  DOB:  Apr 22, 1991  MRN:  [id number]   Preop Diagnosis:  Biliary dyskinesia  Postop Diagnosis:  The same  Procedure:  Laparoscopic cholecystectomy  Surgeon:  Dr. Tilford Pillar  Anes:  General endotracheal, 0.5% Sensorcaine plain  Indications:  Patient is a 21 year old female presented my office with a history of right upper quadrant abdominal pain. Workup and evaluation was consistent for biliary dyskinesia. Risks benefits alternatives a laparoscopic possible open cholecystectomy were discussed at length patient including but not limited to risk of bleeding, infection, bile leak, small bowel injury, common bile duct injury, intraoperative cardiac and pulmonary events. Patient was consented for the planned procedure was taken to the OR for the planned procedure.  Procedure note:  Patient was taken to the OR was placed in a supine position the or table time the general anesthetic is administered. Once patient was asleep the nurse anesthetist endotracheally needed basis the patient. Her abdomen is then prepped and draped in standard fashion. A stab incision was created supraumbilically with 11 blade scalpel. Additional dissection down to subcuticular tissues carried using a Coker clamp utilized to grasp the anterior normal fascia and lift his vertically. A Veress needle is inserted saline drop is utilized confirm intraperitoneal placement and then pneumoperitoneum was initiated. Once sufficient pneumoperitoneum was obtained an 11 mm insert overlap scope line visualization the trocar entering into the peritoneal cavity. At this point the inner cannulas removed the laparoscope was reinserted there is no evidence of any trocar or Veress the placement injury. At this time the remaining trochars placed the 5 mm can epigastrium, 5 and troponin midline, and a final trocar in the right lateral abdominal wall. Patient's placed into a reverse Trendelenburg left lateral decubitus position.  The fundus of the gallbladder is gasless lifted up and over the right lobe the liver. Blunt dissection is carried out using a [place] to strip the peritoneal reflection off the infundibulum exposing both the cystic duct and cystic arteries and her into the infundibulum. A window was created behind the cystic duct and 3 clips placed proximally one distally and the cystic duct was divided between the 2 most clips. Similarly a window was created behind the cystic artery and 2 endoclips placed approximately one distally and the cystic artery is divided between 2 most distal clips. At this point the gallbladder is dissected off the gallbladder fossa using electrocautery. A small cholecystotomy was created with a small amount bile spillage during the dissection which could be controlled with a grasper. Once gallbladder was free it was placed into an Endo Catch bag and placed up and over the right lobe liver. To facilitate this the 10 mm scope is exchanged for a 5 mm scope. Inspection the gallbladder fossa indicate excellent hemostasis. I did insert a Ray-Tec sponge to absorb the spilled bile. Inspection indicated no pooling of bile. The endoclips were expected and they were noted to be in excellent position with no evidence of any bleeding or bile leak. At this time attention was turned to closure.  Using Endo Close suture passing device a 2-0 Vicryl sutures passed to the umbilical trocar site. With this suture and placed the gallbladder was retrieved was removed through the umbilical trocar site and intact Endo Catch bag was placed in the back table sent as a perm specimen to pathology. At this point the pneumoperitoneum was evacuated. Trochars were removed. The Vicryl sutures secured. The local anesthetic is instilled. A 4 Monocryl utilized reapproximate skin edges  at all 4 trocar sites. The skin was washed dried moist dry towel. Benzoin is applied around incision. Half-inch are suture placed. The drapes  removed patient left come general anesthetic and stretcher back he in stable condition. At the conclusion of procedure all angina, sponge, needle counts are correct. patient tolerated procedure extremely well.  Complications:  None apparent  EBL:  Minimal  Specimen:  Gallbladder

## 2012-03-11 ENCOUNTER — Encounter (HOSPITAL_COMMUNITY): Payer: Self-pay | Admitting: General Surgery

## 2012-08-28 ENCOUNTER — Encounter (INDEPENDENT_AMBULATORY_CARE_PROVIDER_SITE_OTHER): Payer: Self-pay | Admitting: *Deleted

## 2012-09-02 ENCOUNTER — Encounter (INDEPENDENT_AMBULATORY_CARE_PROVIDER_SITE_OTHER): Payer: Self-pay | Admitting: Internal Medicine

## 2012-09-02 ENCOUNTER — Other Ambulatory Visit (INDEPENDENT_AMBULATORY_CARE_PROVIDER_SITE_OTHER): Payer: Self-pay | Admitting: *Deleted

## 2012-09-02 ENCOUNTER — Ambulatory Visit (INDEPENDENT_AMBULATORY_CARE_PROVIDER_SITE_OTHER): Payer: BC Managed Care – PPO | Admitting: Internal Medicine

## 2012-09-02 ENCOUNTER — Encounter (INDEPENDENT_AMBULATORY_CARE_PROVIDER_SITE_OTHER): Payer: Self-pay | Admitting: *Deleted

## 2012-09-02 VITALS — Temp 98.1°F | Ht 64.0 in | Wt 193.3 lb

## 2012-09-02 DIAGNOSIS — R079 Chest pain, unspecified: Secondary | ICD-10-CM

## 2012-09-02 DIAGNOSIS — K219 Gastro-esophageal reflux disease without esophagitis: Secondary | ICD-10-CM

## 2012-09-02 NOTE — Patient Instructions (Addendum)
EGD. The risks and benefits such as perforation, bleeding, and infection were reviewed with the patient and is agreeable. 

## 2012-09-02 NOTE — Progress Notes (Signed)
Subjective:     Patient ID: Marissa Lane, female   DOB: 1991/02/12, 22 y.o.   MRN: [id number]  HPIReferred by Dr. Leticia Penna.  She had a GB surgery in August of 2013 for non-functioning gallbladder.  She tells me since her surgery she has left shoulder pain and chest pain. Pain usually in the morning.  No fever. No nausea or vomiting.  Pain usually resolves in 20-30 minutes.  She does not think this pain is acid reflux.   She also has pain in her rib case. The pain started immediately after GB surgery.  Pain with movement. She also tells me she has had acid reflux since the 4th grade. She has been on multiple medications for this.   She has acid reflux every day and is not completely controlled.  Appetite is good. No weight loss. No abdominal pain. BMs are normal. She has a BM daily and sometimes more.  Patient is pain free today. Great grandmother died of colon cancer age 61.  Review of Systems  Current Outpatient Prescriptions  Medication Sig Dispense Refill  . etonogestrel (IMPLANON) 68 MG IMPL implant Inject 1 each into the skin once.      Marland Kitchen ibuprofen (ADVIL,MOTRIN) 200 MG tablet Take 400 mg by mouth every 6 (six) hours as needed. For pain      . pantoprazole (PROTONIX) 40 MG tablet Take 40 mg by mouth daily.      . ranitidine (ZANTAC) 75 MG tablet Take 75 mg by mouth as needed for heartburn.       No current facility-administered medications for this visit.   Past Medical History  Diagnosis Date  . Chest pain   . Palpitations   . Tobacco abuse   . GERD (gastroesophageal reflux disease)   . Dysthymic disorder     over dose age 79  . Obesity   . Acne    Past Surgical History  Procedure Laterality Date  . Tonsillectomy    . Skin graft  2009  . Cholecystectomy  03/06/2012    Procedure: LAPAROSCOPIC CHOLECYSTECTOMY;  Surgeon: Fabio Bering, MD;  Location: [facility];  Service: General;  Laterality: N/A;   Allergies  Allergen Reactions  . Vicodin (Hydrocodone-Acetaminophen) Other  (See Comments)    Bad migraine  . Zofran (Ondansetron Hcl)        Objective:   Physical Exam  Filed Vitals:   09/02/12 1029  Temp: 98.1 F (36.7 C)  Height: 5\' 4"  (1.626 m)  Weight: 193 lb 4.8 oz (87.68 kg)  Alert and oriented. Skin warm and dry. Oral mucosa is moist.   . Sclera anicteric, conjunctivae is pink. Thyroid not enlarged. No cervical lymphadenopathy. Lungs clear. Heart regular rate and rhythm.  Abdomen is soft. Bowel sounds are positive. No hepatomegaly. No abdominal masses felt. No tenderness.  No edema to lower extremities.        Assessment:    GERD not controlled at this time. Chest pain and left shoulder pain which I do not believe is GI related. PUD needs to be ruled out.       Plan:    EGD with Dr Karilyn Cota.

## 2012-09-07 ENCOUNTER — Encounter (HOSPITAL_COMMUNITY): Payer: Self-pay | Admitting: Pharmacy Technician

## 2012-09-09 ENCOUNTER — Encounter (HOSPITAL_COMMUNITY): Payer: Self-pay | Admitting: *Deleted

## 2012-09-09 ENCOUNTER — Encounter (HOSPITAL_COMMUNITY)
Admission: RE | Disposition: A | Discharge status: Home Health | Payer: Self-pay | Source: Ambulatory Visit | Attending: Internal Medicine

## 2012-09-09 ENCOUNTER — Ambulatory Visit (HOSPITAL_COMMUNITY)
Admission: RE | Admit: 2012-09-09 | Discharge: 2012-09-09 | Disposition: A | Discharge status: Home Health | Payer: BC Managed Care – PPO | Source: Ambulatory Visit | Attending: Internal Medicine | Admitting: Internal Medicine

## 2012-09-09 DIAGNOSIS — K208 Other esophagitis without bleeding: Secondary | ICD-10-CM

## 2012-09-09 DIAGNOSIS — R0789 Other chest pain: Secondary | ICD-10-CM

## 2012-09-09 DIAGNOSIS — K219 Gastro-esophageal reflux disease without esophagitis: Secondary | ICD-10-CM

## 2012-09-09 DIAGNOSIS — K2100 Gastro-esophageal reflux disease with esophagitis, without bleeding: Secondary | ICD-10-CM | POA: Insufficient documentation

## 2012-09-09 DIAGNOSIS — R1013 Epigastric pain: Secondary | ICD-10-CM | POA: Insufficient documentation

## 2012-09-09 DIAGNOSIS — R079 Chest pain, unspecified: Secondary | ICD-10-CM | POA: Insufficient documentation

## 2012-09-09 HISTORY — PX: ESOPHAGOGASTRODUODENOSCOPY: SHX5428

## 2012-09-09 SURGERY — EGD (ESOPHAGOGASTRODUODENOSCOPY)
Anesthesia: Moderate Sedation

## 2012-09-09 MED ORDER — MIDAZOLAM HCL 5 MG/5ML IJ SOLN
INTRAMUSCULAR | Status: AC
  Filled 2012-09-09: qty 10

## 2012-09-09 MED ORDER — MEPERIDINE HCL 50 MG/ML IJ SOLN
INTRAMUSCULAR | Status: AC
  Filled 2012-09-09: qty 2

## 2012-09-09 MED ORDER — DEXLANSOPRAZOLE 60 MG PO CPDR: 60.0000 mg | DELAYED_RELEASE_CAPSULE | Freq: Every day | ORAL | Status: DC

## 2012-09-09 MED ORDER — MEPERIDINE HCL 25 MG/ML IJ SOLN
INTRAMUSCULAR | Status: DC | PRN
  Administered 2012-09-09 (×2): 25 mg via INTRAVENOUS

## 2012-09-09 MED ORDER — SODIUM CHLORIDE 0.45 % IV SOLN
INTRAVENOUS | Status: DC
  Administered 2012-09-09: 1000 mL via INTRAVENOUS

## 2012-09-09 MED ORDER — MIDAZOLAM HCL 5 MG/5ML IJ SOLN
INTRAMUSCULAR | Status: DC | PRN
  Administered 2012-09-09: 3 mg via INTRAVENOUS
  Administered 2012-09-09 (×3): 2 mg via INTRAVENOUS

## 2012-09-09 MED ORDER — SIMETHICONE 40 MG/0.6ML PO SUSP
ORAL | Status: DC | PRN
  Administered 2012-09-09: 15:00:00

## 2012-09-09 NOTE — Op Note (Signed)
EGD PROCEDURE REPORT  PATIENT:  Marissa Lane  MR#:  [id number] Birthdate:  14-Dec-1990, 22 y.o., female Endoscopist:  Dr. Malissa Hippo, MD Referred By:  Dr. [name], MD Procedure Date: 09/09/2012  Procedure:   EGD  Indications:  Patient is 22 year old Caucasian female with over 10 years she of GERD who presents with frequent heartburn and intermittent chest pain. She has been on Prilosec for years but quit working and now on pantoprazole and Zantac. She is undergoing diagnostic EGD.            Informed Consent:  The risks, benefits, alternatives & imponderables which include, but are not limited to, bleeding, infection, perforation, drug reaction and potential missed lesion have been reviewed.  The potential for biopsy, lesion removal, esophageal dilation, etc. have also been discussed.  Questions have been answered.  All parties agreeable.  Please see history & physical in medical record for more information.  Medications:  Demerol 50 mg IV Versed 9 mg IV Cetacaine spray topically for oropharyngeal anesthesia  Description of procedure:  The endoscope was introduced through the mouth and advanced to the second portion of the duodenum without difficulty or limitations. The mucosal surfaces were surveyed very carefully during advancement of the scope and upon withdrawal.  Findings:  Esophagus:  Toes of the proximal and middle third was normal. Scattered erosions noted involving distal 3 cm of his aphasia mucosa. No ring or stricture identified. GEJ:  39 cm Stomach:  Stomach was empty and distended very well with insufflation. Folds in the proximal stomach were normal. Examination of mucosa at body, antrum, pyloric channel, angularis, fundus and cardia was normal. Duodenum:  Normal bulbar and post bulbar mucosa.  Therapeutic/Diagnostic Maneuvers Performed:  None  Complications:  None  Impression: Erosive reflux esophagitis. This diagnosis would explain patient's intermittent  chest pain and frequent heartburn. No evidence of peptic ulcer disease.  Recommendations:  Anti-reflux measures reinforced. Continue pantoprazole as it is not working. Start Dexilant 60 mg po qam. Office visit in one month.  Lakeithia Rasor U  09/09/2012  3:55 PM  CC: Dr. [name], MD & Dr. Bonnetta Barry ref. provider found

## 2012-09-09 NOTE — H&P (Signed)
Marissa Lane is an 22 y.o. female.   Chief Complaint: Patient is here for EGD. HPI: Patient is 22 year old Caucasian female who has history of GERD for over 10 years and was on Prilosec until it quit working one year ago. She is on pantoprazole. She has daily heartburn and intermittent chest pain. She seemed to do better she takes Zantac and afternoon. He was not having chest pain until after a cholecystectomy. She had cholecystectomy in August 2013 with resolution of right upper quadrant and subscapular pain. She also complains of greenish yellow stool since cholecystectomy. She denies vomiting melena or rectal bleeding. She has occasional nausea. She also denies dysphagia. She does not drink alcohol but smokes one pack of cigarettes per day. She rarely takes ibuprofen.  Past Medical History  Diagnosis Date  . Chest pain   . Palpitations   . Tobacco abuse   . GERD (gastroesophageal reflux disease)   . Dysthymic disorder     over dose age 54  . Obesity   . Acne     Past Surgical History  Procedure Laterality Date  . Tonsillectomy    . Skin graft  2009  . Cholecystectomy  03/06/2012    Procedure: LAPAROSCOPIC CHOLECYSTECTOMY;  Surgeon: Fabio Bering, MD;  Location: [facility];  Service: General;  Laterality: N/A;    Family History  Problem Relation Age of Onset  . Adopted: Yes  . Congestive Heart Failure Mother   . Irritable bowel syndrome Mother   . Ulcers Mother   . Hypertension Brother    Social History:  reports that she has been smoking Cigarettes.  She has been smoking about 1.00 pack per day. She has never used smokeless tobacco. She reports that she does not drink alcohol or use illicit drugs.  Allergies:  Allergies  Allergen Reactions  . Vicodin (Hydrocodone-Acetaminophen) Other (See Comments)    Bad migraine  . Zofran (Ondansetron Hcl)     Medications Prior to Admission  Medication Sig Dispense Refill  . etonogestrel (IMPLANON) 68 MG IMPL implant Inject 1 each into  the skin once.      Marland Kitchen ibuprofen (ADVIL,MOTRIN) 200 MG tablet Take 400 mg by mouth every 6 (six) hours as needed. For pain      . pantoprazole (PROTONIX) 40 MG tablet Take 40 mg by mouth daily.      . ranitidine (ZANTAC) 75 MG tablet Take 75 mg by mouth as needed for heartburn.        No results found for this or any previous visit (from the past 48 hour(s)). No results found.  ROS  Blood pressure 133/79, pulse 91, temperature 98.5 F (36.9 C), temperature source Oral, resp. rate 18, last menstrual period 07/19/2012, SpO2 100.00%. Physical Exam  Constitutional: She appears well-developed and well-nourished.  HENT:  Mouth/Throat: Oropharynx is clear and moist.  Eyes: Conjunctivae are normal. No scleral icterus.  Neck: No thyromegaly present.  Cardiovascular: Normal rate, regular rhythm and normal heart sounds.   No murmur heard. Respiratory: Effort normal and breath sounds normal.  GI: Soft. She exhibits no distension and no mass. There is no tenderness.  Musculoskeletal: She exhibits no edema.  Lymphadenopathy:    She has no cervical adenopathy.  Neurological: She is alert.  Skin: Skin is warm and dry.     Assessment/Plan Noncardiac chest pain. Chronic GERD frequent heartburn. Diagnostic EGD.  Joeziah Voit U 09/09/2012, 3:30 PM

## 2012-09-10 ENCOUNTER — Encounter (HOSPITAL_COMMUNITY): Payer: Self-pay | Admitting: Internal Medicine

## 2012-10-13 ENCOUNTER — Ambulatory Visit (INDEPENDENT_AMBULATORY_CARE_PROVIDER_SITE_OTHER): Payer: BC Managed Care – PPO | Admitting: Internal Medicine

## 2012-10-27 ENCOUNTER — Telehealth: Payer: Self-pay | Admitting: Family Medicine

## 2012-10-27 NOTE — Telephone Encounter (Signed)
Needs a call regarding what to do for head lice.

## 2012-10-27 NOTE — Telephone Encounter (Signed)
Discussed with patient. Patient verbalized understanding. 

## 2012-10-27 NOTE — Telephone Encounter (Signed)
If lice present and then there is available treatment over-the-counter at the pharmacy. RID is commonly used. If that fails then there are other prescription such as Ovide which could be tried.

## 2012-10-27 NOTE — Telephone Encounter (Signed)
Exposed to lice and needs treatment advice.

## 2012-11-05 ENCOUNTER — Ambulatory Visit (INDEPENDENT_AMBULATORY_CARE_PROVIDER_SITE_OTHER): Payer: BC Managed Care – PPO | Admitting: Internal Medicine

## 2012-11-05 ENCOUNTER — Encounter (INDEPENDENT_AMBULATORY_CARE_PROVIDER_SITE_OTHER): Payer: Self-pay | Admitting: Internal Medicine

## 2012-11-05 VITALS — BP 112/66 | HR 96 | Ht 64.0 in | Wt 194.1 lb

## 2012-11-05 DIAGNOSIS — K2210 Ulcer of esophagus without bleeding: Secondary | ICD-10-CM | POA: Insufficient documentation

## 2012-11-05 DIAGNOSIS — K2100 Gastro-esophageal reflux disease with esophagitis, without bleeding: Secondary | ICD-10-CM

## 2012-11-05 DIAGNOSIS — K208 Other esophagitis without bleeding: Secondary | ICD-10-CM

## 2012-11-05 MED ORDER — DEXLANSOPRAZOLE 60 MG PO CPDR: 60.0000 mg | DELAYED_RELEASE_CAPSULE | Freq: Every day | ORAL | Status: DC

## 2012-11-05 NOTE — Progress Notes (Signed)
Subjective:     Patient ID: Marissa Lane, female   DOB: 11/06/1990, 22 y.o.   MRN: [id number]  HPI Here today for f/u of her erosive reflux esohagitis. She underwent an EGD in February of this year. She was given samples of Dexilant and this controlled her acid reflux. She tells me she has had acid reflux since she was 5 yrs ago. She says the Dexilant was a major difference. She has been on Protonix, Omeprazole, Zantac which did not control her symptoms. Appetite is good. No weight loss. No melena or bright red rectal bleeding.  09/09/2012 EGD: chest pain, Dr. Karilyn Cota:  Impression:  Erosive reflux esophagitis.  This diagnosis would explain patient's intermittent chest pain and frequent heartburn.  No evidence of peptic ulcer disease.     Review of Systems see hpi Current Outpatient Prescriptions  Medication Sig Dispense Refill  . dexlansoprazole (DEXILANT) 60 MG capsule Take 1 capsule (60 mg total) by mouth daily.  30 capsule  5  . ibuprofen (ADVIL,MOTRIN) 200 MG tablet Take 400 mg by mouth every 6 (six) hours as needed. For pain       No current facility-administered medications for this visit.   Current Outpatient Prescriptions  Medication Sig Dispense Refill  . dexlansoprazole (DEXILANT) 60 MG capsule Take 1 capsule (60 mg total) by mouth daily.  30 capsule  5  . ibuprofen (ADVIL,MOTRIN) 200 MG tablet Take 400 mg by mouth every 6 (six) hours as needed. For pain       No current facility-administered medications for this visit.   Past Medical History  Diagnosis Date  . Chest pain   . Palpitations   . Tobacco abuse   . GERD (gastroesophageal reflux disease)   . Dysthymic disorder     over dose age 45  . Obesity   . Acne    Past Surgical History  Procedure Laterality Date  . Tonsillectomy    . Skin graft  2009  . Cholecystectomy  03/06/2012    Procedure: LAPAROSCOPIC CHOLECYSTECTOMY;  Surgeon: Fabio Bering, MD;  Location: [facility];  Service: General;  Laterality: N/A;   . Esophagogastroduodenoscopy N/A 09/09/2012    Procedure: ESOPHAGOGASTRODUODENOSCOPY (EGD);  Surgeon: Malissa Hippo, MD;  Location: [facility];  Service: Endoscopy;  Laterality: N/A;  340   Allergies  Allergen Reactions  . Vicodin (Hydrocodone-Acetaminophen) Other (See Comments)    Bad migraine  . Zofran (Ondansetron Hcl)         Objective:   Physical Exam  Filed Vitals:   11/05/12 1359  BP: 112/66  Pulse: 96  Height: 5\' 4"  (1.626 m)  Weight: 194 lb 1.6 oz (88.043 kg)   Alert and oriented. Skin warm and dry. Oral mucosa is moist.   . Sclera anicteric, conjunctivae is pink. Thyroid not enlarged. No cervical lymphadenopathy. Lungs clear. Heart regular rate and rhythm.  Abdomen is soft. Bowel sounds are positive. No hepatomegaly. No abdominal masses felt. No tenderness.  No edema to lower extremities.       Assessment:    Erosive reflux esophagitis. Controlled better with Dexilant.    Plan:    Rx for Dexilant eprescribed to her pharmacy. OV prn. Adivsed to diet and exercise.

## 2012-11-05 NOTE — Patient Instructions (Addendum)
Dexilant Rx eprescribed to her pharmacy. OV as needed. Samples x 2 boxes given to patient.

## 2012-12-07 ENCOUNTER — Encounter: Payer: Self-pay | Admitting: *Deleted

## 2012-12-08 ENCOUNTER — Encounter: Payer: Self-pay | Admitting: Nurse Practitioner

## 2012-12-08 ENCOUNTER — Ambulatory Visit (INDEPENDENT_AMBULATORY_CARE_PROVIDER_SITE_OTHER): Payer: BC Managed Care – PPO | Admitting: Nurse Practitioner

## 2012-12-08 ENCOUNTER — Other Ambulatory Visit: Payer: Self-pay | Admitting: Nurse Practitioner

## 2012-12-08 VITALS — BP 124/78 | HR 80 | Ht 64.0 in | Wt 196.0 lb

## 2012-12-08 DIAGNOSIS — Z0000 Encounter for general adult medical examination without abnormal findings: Secondary | ICD-10-CM

## 2012-12-08 DIAGNOSIS — K219 Gastro-esophageal reflux disease without esophagitis: Secondary | ICD-10-CM

## 2012-12-08 DIAGNOSIS — F17200 Nicotine dependence, unspecified, uncomplicated: Secondary | ICD-10-CM

## 2012-12-08 DIAGNOSIS — Z7251 High risk heterosexual behavior: Secondary | ICD-10-CM

## 2012-12-08 DIAGNOSIS — Z01419 Encounter for gynecological examination (general) (routine) without abnormal findings: Secondary | ICD-10-CM

## 2012-12-08 DIAGNOSIS — E282 Polycystic ovarian syndrome: Secondary | ICD-10-CM

## 2012-12-08 LAB — TESTOSTERONE: Testosterone: 69 ng/dL (ref 10–70)

## 2012-12-08 MED ORDER — VARENICLINE TARTRATE 0.5 MG PO TABS: ORAL_TABLET | ORAL | Status: DC

## 2012-12-08 MED ORDER — SUCRALFATE 1 GM/10ML PO SUSP: 1.0000 g | Freq: Four times a day (QID) | ORAL | Status: DC

## 2012-12-11 ENCOUNTER — Encounter: Payer: Self-pay | Admitting: Nurse Practitioner

## 2012-12-11 DIAGNOSIS — E282 Polycystic ovarian syndrome: Secondary | ICD-10-CM | POA: Insufficient documentation

## 2012-12-11 NOTE — Assessment & Plan Note (Signed)
Insurance will not pay for dexilant. Continue daily PPI as directed. Add Carafate as directed when necessary. Call back if no improvement.

## 2012-12-11 NOTE — Assessment & Plan Note (Signed)
Cycles are regular with normal flow. Testosterone level ordered. Mild facial acne noted. Continue to work on weight loss and activity.

## 2012-12-11 NOTE — Progress Notes (Signed)
  Subjective:    Patient ID: Marissa Lane, female    DOB: Apr 08, 1991, 22 y.o.   MRN: [id number]  HPI presents for her wellness checkup. Has been off her Cymbalta for about a month. Depression symptoms have resolved. Patient noted that when she discontinued the NuvaRing and Implanon that her mood improved. Using condoms consistently for birth control. Same sexual partner. Cycles are regular, normal flow. Would like to try Chantix to quit smoking. Has done this once before successfully. Regular eye exams. Last dental exam was last year. Dexilant works the best for her reflux but insurance will not pay for it. Having some breakthrough reflux symptoms at times. Bowels normal limit.    Review of Systems  Constitutional: Negative for activity change, appetite change and fatigue.  HENT: Negative for dental problem.   Eyes: Negative for visual disturbance.  Respiratory: Negative for chest tightness and shortness of breath.   Cardiovascular: Negative for chest pain.  Gastrointestinal: Negative for vomiting, diarrhea, constipation, blood in stool and abdominal distention.  Genitourinary: Negative for dysuria, vaginal discharge, difficulty urinating, menstrual problem and pelvic pain.  Psychiatric/Behavioral: Negative for sleep disturbance.       Objective:   Physical Exam  Constitutional: She is oriented to person, place, and time. She appears well-developed. No distress.  HENT:  Right Ear: External ear normal.  Left Ear: External ear normal.  Mouth/Throat: Oropharynx is clear and moist.  Neck: Normal range of motion. Neck supple. No tracheal deviation present. No thyromegaly present.  Cardiovascular: Normal rate, regular rhythm and normal heart sounds.  Exam reveals no gallop.   No murmur heard. Pulmonary/Chest: Effort normal and breath sounds normal.  Abdominal: Soft. She exhibits no distension. There is no tenderness.  Genitourinary: Vagina normal and uterus normal. No vaginal discharge  found.  Musculoskeletal: She exhibits no edema.  Lymphadenopathy:    She has no cervical adenopathy.  Neurological: She is alert and oriented to person, place, and time.  Skin: Skin is warm and dry. No rash noted.  Psychiatric: She has a normal mood and affect. Her behavior is normal.   Breast exam no masses noted. Axilla no adenopathy. EGBUS normal limit. Vagina mucoid discharge noted. No CMT. Bimanual exam normal limit, no tenderness noted. Mild facial acne noted.       Assessment & Plan:  Well woman exam  High risk sexual behavior - Plan: GC/chlamydia probe amp, urine  PCOS (polycystic ovarian syndrome) - Plan: Testosterone  Meds ordered this encounter  Medications  . sucralfate (CARAFATE) 1 GM/10ML suspension    Sig: Take 10 mLs (1 g total) by mouth 4 (four) times daily. Before meals and at bedtime.    Dispense:  420 mL    Refill:  0    Order Specific Question:  Supervising Provider    Answer:  Merlyn Albert [[id number]]  . varenicline (CHANTIX) 0.5 MG tablet    Sig: One po qd x 3 d then 2 po qd    Dispense:  57 tablet    Refill:  0    Order Specific Question:  Supervising Provider    Answer:  Merlyn Albert [[id number]]   Continue to work on weight loss and healthy diet. Recommend regular activity. Discussed safe sex issues. Next physical in one year.

## 2013-01-15 ENCOUNTER — Encounter: Payer: Self-pay | Admitting: Family Medicine

## 2013-01-15 ENCOUNTER — Ambulatory Visit (INDEPENDENT_AMBULATORY_CARE_PROVIDER_SITE_OTHER): Payer: BC Managed Care – PPO | Admitting: Family Medicine

## 2013-01-15 VITALS — BP 132/71 | Temp 98.3°F | Wt 202.6 lb

## 2013-01-15 DIAGNOSIS — J329 Chronic sinusitis, unspecified: Secondary | ICD-10-CM

## 2013-01-15 DIAGNOSIS — J310 Chronic rhinitis: Secondary | ICD-10-CM

## 2013-01-15 MED ORDER — AZITHROMYCIN 250 MG PO TABS: ORAL_TABLET | ORAL | Status: DC

## 2013-01-15 NOTE — Progress Notes (Signed)
  Subjective:    Patient ID: Marissa Lane, female    DOB: [birth year], 22 y.o.   MRN: [id number]  Sore Throat  This is a new problem. The current episode started yesterday. The problem has been gradually worsening. There has been no fever. The pain is at a severity of 7/10. Associated symptoms include congestion and headaches. She has tried nothing (dayquil nyquil and benadryl) for the symptoms.   Diminished energy. Potential low-grade fever off-and-on.   Review of Systems  HENT: Positive for congestion.   Neurological: Positive for headaches.   No vomiting or diarrhea ROS otherwise negative.    Objective:   Physical Exam  Alert mild malaise. Vitals reviewed. Frontal and maxillary tenderness evident. Pharynx erythematous neck supple. Lungs clear heart regular in rhythm.      Assessment & Plan:  Impression rhinosinusitis with secondary pharyngitis. Plan symptomatic care discussed. Antibiotics prescribed. WSL

## 2013-01-19 ENCOUNTER — Ambulatory Visit (INDEPENDENT_AMBULATORY_CARE_PROVIDER_SITE_OTHER): Payer: BC Managed Care – PPO | Admitting: Nurse Practitioner

## 2013-01-19 ENCOUNTER — Encounter: Payer: Self-pay | Admitting: Nurse Practitioner

## 2013-01-19 VITALS — BP 114/70 | HR 90 | Ht 64.0 in | Wt 200.1 lb

## 2013-01-19 DIAGNOSIS — R002 Palpitations: Secondary | ICD-10-CM

## 2013-01-19 MED ORDER — PHENTERMINE HCL 37.5 MG PO TABS: 37.5000 mg | ORAL_TABLET | Freq: Every day | ORAL | Status: DC

## 2013-01-20 ENCOUNTER — Encounter: Payer: Self-pay | Admitting: Nurse Practitioner

## 2013-01-20 NOTE — Assessment & Plan Note (Signed)
Restart phentermine 37.5 mg every morning.

## 2013-01-20 NOTE — Progress Notes (Signed)
Subjective:  Presents to discuss her weight. Has been on phentermine for well over 6 months. Took it a brief period a couple times last year. History of palpitations, has not had a problem with this. No shortness of breath. No insomnia. Her main side effect was dry mouth. Has plans to restart regular exercise program. Trying to eat a healthy diet.  Objective:   BP 114/70  Pulse 90  Ht 5\' 4"  (1.626 m)  Wt 200 lb 2 oz (90.776 kg)  BMI 34.33 kg/m2  LMP 01/01/2013 NAD. Alert, oriented. Lungs clear. Heart regular rate rhythm. No murmur noted.  Assessment:Palpitations  Morbid obesity  Plan: Meds ordered this encounter  Medications  . Multiple Vitamins-Minerals (MULTIVITAMIN WITH MINERALS) tablet    Sig: Take 1 tablet by mouth daily.  . phentermine (ADIPEX-P) 37.5 MG tablet    Sig: Take 1 tablet (37.5 mg total) by mouth daily before breakfast.    Dispense:  30 tablet    Refill:  2    Order Specific Question:  Supervising Provider    Answer:  Merlyn Albert [[id number]]   Recheck in 3 months if she wishes to continue medication, DC med and call if any problems.

## 2013-02-17 NOTE — Progress Notes (Signed)
Lab complete. Results on chart

## 2013-04-29 ENCOUNTER — Encounter: Payer: Self-pay | Admitting: Family Medicine

## 2013-04-29 ENCOUNTER — Ambulatory Visit (INDEPENDENT_AMBULATORY_CARE_PROVIDER_SITE_OTHER): Payer: BC Managed Care – PPO | Admitting: Family Medicine

## 2013-04-29 VITALS — Temp 98.1°F | Ht 65.0 in | Wt 195.2 lb

## 2013-04-29 DIAGNOSIS — J029 Acute pharyngitis, unspecified: Secondary | ICD-10-CM

## 2013-04-29 DIAGNOSIS — A088 Other specified intestinal infections: Secondary | ICD-10-CM

## 2013-04-29 DIAGNOSIS — A084 Viral intestinal infection, unspecified: Secondary | ICD-10-CM

## 2013-04-29 MED ORDER — CEFTRIAXONE SODIUM 1 G IJ SOLR
500.0000 mg | Freq: Once | INTRAMUSCULAR | Status: AC
  Administered 2013-04-29: 500 mg via INTRAMUSCULAR

## 2013-04-29 MED ORDER — AMOXICILLIN 500 MG PO TABS: 500.0000 mg | ORAL_TABLET | Freq: Three times a day (TID) | ORAL | Status: DC

## 2013-04-29 MED ORDER — PROMETHAZINE HCL 25 MG/ML IJ SOLN
25.0000 mg | Freq: Once | INTRAMUSCULAR | Status: AC
  Administered 2013-04-29: 25 mg via INTRAMUSCULAR

## 2013-04-29 MED ORDER — PROMETHAZINE HCL 25 MG PO TABS: 25.0000 mg | ORAL_TABLET | Freq: Four times a day (QID) | ORAL | Status: DC | PRN

## 2013-04-29 NOTE — Progress Notes (Signed)
  Subjective:    Patient ID: Marissa Lane, female    DOB: [birth year], 22 y.o.   MRN: [id number]  HPI  Patient arrives with complaint of sinus symptoms since yesterday. Started with vomiting 5 min ago.   Review of Systems  Constitutional: Positive for chills. Negative for fever, appetite change and fatigue.  HENT: Positive for sinus pressure and sore throat. Negative for rhinorrhea and sneezing.   Respiratory: Negative for cough and choking.   Cardiovascular: Negative for chest pain.  Gastrointestinal: Positive for vomiting. Negative for abdominal pain.  Musculoskeletal: Positive for myalgias.       Objective:   Physical Exam  Vitals reviewed. Constitutional: She appears well-developed and well-nourished.  HENT:  Right Ear: External ear normal.  Left Ear: External ear normal.  Mouth/Throat: Oropharyngeal exudate present.  Neck: Normal range of motion.  Cardiovascular: Normal rate, regular rhythm and normal heart sounds.   No murmur heard. Pulmonary/Chest: Effort normal and breath sounds normal. No respiratory distress. She has no wheezes.  Abdominal: Soft.  Lymphadenopathy:    She has cervical adenopathy.          Assessment & Plan:  Acute pharyngitis - Plan: cefTRIAXone (ROCEPHIN) injection 500 mg  Viral gastroenteritis - Plan: promethazine (PHENERGAN) injection 25 mg  Warnings discussed

## 2013-05-28 ENCOUNTER — Encounter: Payer: Self-pay | Admitting: Obstetrics & Gynecology

## 2013-05-28 ENCOUNTER — Ambulatory Visit (INDEPENDENT_AMBULATORY_CARE_PROVIDER_SITE_OTHER): Payer: BC Managed Care – PPO | Admitting: Obstetrics & Gynecology

## 2013-05-28 VITALS — BP 110/60 | Ht 64.0 in | Wt 196.0 lb

## 2013-05-28 DIAGNOSIS — R1030 Lower abdominal pain, unspecified: Secondary | ICD-10-CM

## 2013-05-28 DIAGNOSIS — R109 Unspecified abdominal pain: Secondary | ICD-10-CM

## 2013-05-28 NOTE — Progress Notes (Signed)
Patient ID: Marissa Lane, female   DOB: May 04, 1991, 22 y.o.   MRN: [id number] Marissa Lane presents complaining about 2 week exacerbation of a somewhat chronic pain just in the area of the pubis radiation left greater than right She first noticed it around the time of a pregnancy and then delivering after delivery but it got better since that time she is noted it on and off this time I may be worse than others Associated with menstrual period She's noted some occasional discomfort with intercourse but none which interrupted sex The patient denies any new exercise program  No fever no discharge that she is noted  No trouble with her urinary habits  Exam Positive trigger points in the rectus abdominis fascia and pubis left greater than right No lesions No lymphadenopathy Vulva negative Vagina no discharge pain moist no lesions Cervix no lesions Uterus has no cervical motion tenderness is normal size the adnexa is negative  Impression Or a muscular pain at the rectus pubis junction questionably related to injury the time of pregnancy and delivery with occasional exacerbations  Plan I did a series of injections along this border using half percent Marcaine 20 cc total  Post injection exam reveals a significant improvement in the pain She is instructed she can expect about at least 4 hours of significant pain relief but certainly could be much foreign that depending on local endorphin release Followup in 3 weeks for reevaluation if needed

## 2013-06-21 ENCOUNTER — Ambulatory Visit: Payer: BC Managed Care – PPO | Admitting: Obstetrics & Gynecology

## 2013-06-25 ENCOUNTER — Encounter (INDEPENDENT_AMBULATORY_CARE_PROVIDER_SITE_OTHER): Payer: Self-pay

## 2013-06-25 ENCOUNTER — Ambulatory Visit (INDEPENDENT_AMBULATORY_CARE_PROVIDER_SITE_OTHER): Payer: BC Managed Care – PPO | Admitting: Adult Health

## 2013-06-25 ENCOUNTER — Encounter: Payer: Self-pay | Admitting: Adult Health

## 2013-06-25 VITALS — BP 120/78 | Ht 64.0 in | Wt 198.5 lb

## 2013-06-25 DIAGNOSIS — Z87898 Personal history of other specified conditions: Secondary | ICD-10-CM

## 2013-06-25 DIAGNOSIS — Z87448 Personal history of other diseases of urinary system: Secondary | ICD-10-CM

## 2013-06-25 HISTORY — DX: Personal history of other specified conditions: Z87.898

## 2013-06-25 LAB — POCT URINALYSIS DIPSTICK: Blood, UA: NEGATIVE

## 2013-06-25 NOTE — Patient Instructions (Signed)
Urinary Frequency The number of times a normal person urinates depends upon how much liquid they take in and how much liquid they are losing. If the temperature is hot and there is high humidity then the person will sweat more and usually breathe a little more frequently. These factors decrease the amount of frequency of urination that would be considered normal. The amount you drink is easily determined, but the amount of fluid lost is sometimes more difficult to calculate.  Fluid is lost in two ways:  Sensible fluid loss is usually measured by the amount of urine that you get rid of. Losses of fluid can also occur with diarrhea.  Insensible fluid loss is more difficult to measure. It is caused by evaporation. Insensible loss of fluid occurs through breathing and sweating. It usually ranges from a little less than a quart to a little more than a quart of fluid a day. In normal temperatures and activity levels the average person may urinate 4 to 7 times in a 24-hour period. Needing to urinate more often than that could indicate a problem. If one urinates 4 to 7 times in 24 hours and has large volumes each time, that could indicate a different problem from one who urinates 4 to 7 times a day and has small volumes. The time of urinating is also an important. Most urinating should be done during the waking hours. Getting up at night to urinate frequently can indicate some problems. CAUSES  The bladder is the organ in your lower abdomen that holds urine. Like a balloon, it swells some as it fills up. Your nerves sense this and tell you it is time to head for the bathroom. There are a number of reasons that you might feel the need to urinate more often than usual. They include:  Urinary tract infection. This is usually associated with other signs such as burning when you urinate.  In men, problems with the prostate (a walnut-size gland that is located near the tube that carries urine out of your body).  There are two reasons why the prostate can cause an increased frequency of urination:  An enlarged prostate that does not let the bladder empty well. If the bladder only half empties when you urinate then it only has half the capacity to fill before you have to urinate again.  The nerves in the bladder become more hypersensitive with an increased size of the prostate even if the bladder empties completely.  Pregnancy.  Obesity. Excess weight is more likely to cause a problem for women more than for men.  Bladder stones or other bladder problems.  Caffeine.  Alcohol.  Medications. For example, drugs that help the body get rid of extra fluid (diuretics) increase urine production. Some other medicines must be taken with lots of fluids.  Muscle or nerve weakness. This might be the result of a spinal cord injury, a stroke, multiple sclerosis or Parkinson's disease.  Long-standing diabetes can decrease the sensation of the bladder. This loss of sensation makes it harder to sense the bladder needs to be emptied. Over a period of years the bladder is stretched out by constant overfilling. This weakens the bladder muscles so that the bladder does not empty well and has less capacity to fill with new urine.  Interstitial cystitis (also called painful bladder syndrome). This condition develops because the tissues that line the insider of the bladder are inflamed (inflammation is the body's way of reacting to injury or infection). It causes pain  and frequent urination. It occurs in women more often than in men. DIAGNOSIS   To decide what might be causing your urinary frequency, your healthcare provider will probably:  Ask about symptoms you have noticed.  Ask about your overall health. This will include questions about any medications you are taking.  Do a physical examination.  Order some tests. These might include:  A blood test to check for diabetes or other health issues that could be  contributing to the problem.  Urine testing. This could measure the flow of urine and the pressure on the bladder.  A test of your neurological system (the brain, spinal cord and nerves). This is the system that senses the need to urinate.  A bladder test to check whether it is emptying completely when you urinate.  Cytoscopy. This test uses a thin tube with a tiny camera on it. It offers a look inside your urethra and bladder to see if there are problems.  Imaging tests. You might be given a contrast dye and then asked to urinate. X-rays are taken to see how your bladder is working. TREATMENT  It is important for you to be evaluated to determine if the amount or frequency that you have is unusual or abnormal. If it is found to be abnormal the cause should be determined and this can usually be found out easily. Depending upon the cause treatment could include medication, stimulation of the nerves, or surgery. There are not too many things that you can do as an individual to change your urinary frequency. It is important that you balance the amount of fluid intake needed to compensate for your activity and the temperature. Medical problems will be diagnosed and taken care of by your physician. There is no particular bladder training such as Kegel's exercises that you can do to help urinary frequency. This is an exercise this is usually done for people who have leaking of urine when they laugh cough or sneeze. HOME CARE INSTRUCTIONS   Take any medications your healthcare provider prescribed or suggested. Follow the directions carefully.  Practice any lifestyle changes that are recommended. These might include:  Drinking less fluid or drinking at different times of the day. If you need to urinate often during the night, for example, you may need to stop drinking fluids early in the evening.  Cutting down on caffeine or alcohol. They both can make you need to urinate more often than normal.  Caffeine is found in coffee, tea and sodas.  Losing weight, if that is recommended.  Keep a journal or a log. You might be asked to record how much you drink and when and when you feel the need to urinate. This will also help evaluate how well the treatment provided by your physician is working. SEEK MEDICAL CARE IF:   Your need to urinate often gets worse.  You feel increased pain or irritation when you urinate.  You notice blood in your urine.  You have questions about any medications that your healthcare provider recommended.  You notice blood, pus or swelling at the site of any test or treatment procedure.  You develop a fever of more than 100.5 F (38.1 C). SEEK IMMEDIATE MEDICAL CARE IF:  You develop a fever of more than 102.0 F (38.9 C). Document Released: 05/04/2009 Document Revised: 09/30/2011 Document Reviewed: 05/04/2009 [facility] Patient Information 2014 [place], [place]. Push water

## 2013-06-25 NOTE — Progress Notes (Signed)
Subjective:     Patient ID: Marissa Lane, female   DOB: 1991-05-16, 22 y.o.   MRN: [id number]  HPI Marissa Lane is a 22 year old white female in saying she had Leukocytes in her urine at school and has had urinary frequency, no fever,no burn, no back pain.  Review of Systems See HPI Reviewed past medical,surgical, social and family history. Reviewed medications and allergies.     Objective:   Physical Exam BP 120/78  Ht 5\' 4"  (1.626 m)  Wt 198 lb 8 oz (90.039 kg)  BMI 34.06 kg/m2  LMP 06/09/2013   Urine dipstick negative, No CVAT or low back pain, will send urine for culture to R/O UTI  Assessment:     Urinary frequency    Plan:    UA C&S sent Push water Follow up culture next week

## 2013-06-26 LAB — URINALYSIS
Leukocytes, UA: NEGATIVE
Nitrite: NEGATIVE
Protein, ur: NEGATIVE mg/dL
Specific Gravity, Urine: 1.007 (ref 1.005–1.030)
Urobilinogen, UA: 0.2 mg/dL (ref 0.0–1.0)

## 2013-06-27 LAB — URINE CULTURE

## 2013-06-28 ENCOUNTER — Telehealth: Payer: Self-pay | Admitting: Adult Health

## 2013-06-28 NOTE — Telephone Encounter (Signed)
Pt aware of labs  

## 2013-07-01 ENCOUNTER — Ambulatory Visit: Payer: BC Managed Care – PPO | Admitting: Nurse Practitioner

## 2013-09-01 ENCOUNTER — Telehealth: Payer: Self-pay | Admitting: Adult Health

## 2013-09-01 NOTE — Telephone Encounter (Signed)
Pt states that she usually starts every 27 days. Started period Saturday, had brownish spot of blood all day and then had some spotting on Sunday. Has not had anything since. Pt states that she is not currently using BC. Last unprotected sex like a week ago. Pt advised to take home pregnancy test and call us back either way. Pt advised she could make an appointment with a provider but she opted to wait and do the home pregnancy test.

## 2013-09-03 ENCOUNTER — Ambulatory Visit: Payer: BC Managed Care – PPO | Admitting: Nurse Practitioner

## 2013-09-06 ENCOUNTER — Telehealth: Payer: Self-pay | Admitting: Family Medicine

## 2013-09-06 NOTE — Telephone Encounter (Signed)
error 

## 2013-09-13 ENCOUNTER — Telehealth: Payer: Self-pay | Admitting: Family Medicine

## 2013-09-13 NOTE — Telephone Encounter (Signed)
Shot record ready for pickup at front desk. Patient was notified.

## 2013-09-13 NOTE — Telephone Encounter (Signed)
Pt needs copy of shot record

## 2013-09-15 ENCOUNTER — Ambulatory Visit: Payer: BC Managed Care – PPO

## 2013-09-21 ENCOUNTER — Encounter: Payer: Self-pay | Admitting: Nurse Practitioner

## 2013-09-21 ENCOUNTER — Ambulatory Visit (INDEPENDENT_AMBULATORY_CARE_PROVIDER_SITE_OTHER): Payer: BC Managed Care – PPO | Admitting: Nurse Practitioner

## 2013-09-21 VITALS — BP 122/82 | Ht 65.0 in | Wt 204.4 lb

## 2013-09-21 DIAGNOSIS — F419 Anxiety disorder, unspecified: Secondary | ICD-10-CM

## 2013-09-21 DIAGNOSIS — Z23 Encounter for immunization: Secondary | ICD-10-CM

## 2013-09-21 DIAGNOSIS — Z0000 Encounter for general adult medical examination without abnormal findings: Secondary | ICD-10-CM

## 2013-09-21 DIAGNOSIS — Z1389 Encounter for screening for other disorder: Secondary | ICD-10-CM

## 2013-09-21 DIAGNOSIS — F411 Generalized anxiety disorder: Secondary | ICD-10-CM

## 2013-09-21 DIAGNOSIS — E282 Polycystic ovarian syndrome: Secondary | ICD-10-CM

## 2013-09-21 DIAGNOSIS — Z79899 Other long term (current) drug therapy: Secondary | ICD-10-CM

## 2013-09-22 ENCOUNTER — Encounter: Payer: Self-pay | Admitting: Nurse Practitioner

## 2013-09-22 MED ORDER — SERTRALINE HCL 50 MG PO TABS: 50.0000 mg | ORAL_TABLET | Freq: Every day | ORAL | Status: DC

## 2013-09-22 NOTE — Progress Notes (Signed)
Subjective:  Presents for recheck on her anxiety. Was doing well until recently. Has been under more stress. Did well in school last or, is struggling this year. Having fatigue slight weight gain mild emotional lability short-term memory loss and difficulty focusing. Denies suicidal thoughts or ideation. Smokes one pack per day. Is due for her routine lab work. Had a flat affect with Lexapro.  Objective:   BP 122/82  Ht 5\' 5"  (1.651 m)  Wt 204 lb 6.4 oz (92.715 kg)  BMI 34.01 kg/m2 NAD. Alert, oriented. Calm affect. Lungs clear. Heart regular rate rhythm.  Assessment:Anxiety  Need for prophylactic vaccination with combined diphtheria-tetanus-pertussis (DTP) vaccine - Plan: Tdap vaccine greater than or equal to 7yo IM  Routine general medical examination at a health care facility - Plan: TB Skin Test, Lipid panel  PCOS (polycystic ovarian syndrome) - Plan: Basic metabolic panel, TSH, Testosterone, Insulin, Fasting  Encounter for long-term (current) use of other medications - Plan: Hepatic function panel  Plan: Start Zoloft 50 mg half tab by mouth daily for several days and if tolerated increase to 50 mg daily. DC med and call if any adverse affects. Routine lab work ordered. Patient gets preventive health physicals with her gynecologist. Return in about 1 month (around 10/22/2013). Call back sooner if any problems.

## 2013-09-23 LAB — TB SKIN TEST
Induration: 0 mm
TB Skin Test: NEGATIVE

## 2013-10-07 ENCOUNTER — Telehealth: Payer: Self-pay | Admitting: Adult Health

## 2013-10-07 NOTE — Telephone Encounter (Signed)
Pt does have an appointment. Pt called a couple weeks ago about period, period only lasted a few days. Having some irregular periods, started spotting 7 days after having a period. Pt states that she is not on any kind of BC. Pt had a negative UPT a month or so ago. Pt has appointment Tuesday.   Pt advised to keep appointment on Tuesday with provider, bring list of questions and notes of how periods have been for the past few months. Pt verbalized understanding.

## 2013-10-12 ENCOUNTER — Encounter: Payer: Self-pay | Admitting: Adult Health

## 2013-10-12 ENCOUNTER — Ambulatory Visit (INDEPENDENT_AMBULATORY_CARE_PROVIDER_SITE_OTHER): Payer: BC Managed Care – PPO | Admitting: Adult Health

## 2013-10-12 VITALS — BP 110/70 | Ht 64.0 in | Wt 199.5 lb

## 2013-10-12 DIAGNOSIS — Z1212 Encounter for screening for malignant neoplasm of rectum: Secondary | ICD-10-CM

## 2013-10-12 DIAGNOSIS — N921 Excessive and frequent menstruation with irregular cycle: Secondary | ICD-10-CM | POA: Insufficient documentation

## 2013-10-12 HISTORY — DX: Excessive and frequent menstruation with irregular cycle: N92.1

## 2013-10-12 LAB — POCT URINE PREGNANCY: PREG TEST UR: NEGATIVE

## 2013-10-12 NOTE — Progress Notes (Signed)
Subjective:     Patient ID: Marissa Lane, female   DOB: [birth year], 23 y.o.   MRN: [id number]  HPI Marissa Lane is a 23 year old white female in complaining of having irregular bleeding this past month had been normal before February,No pain,spotted 2/8 x 2 days then had period 2/26 x 6 days then 3/10 x 4 days spotted, using condoms or nothing ok if gets pregnant.No new partners.  Review of Systems See HPI Reviewed past medical,surgical, social and family history. Reviewed medications and allergies.     Objective:   Physical Exam BP 110/70  Ht 5\' 4"  (1.626 m)  Wt 199 lb 8 oz (90.493 kg)  BMI 34.23 kg/m2  LMP 09/28/2013   UPT negative, Skin warm and dry.Pelvic: external genitalia is normal in appearance, vagina: scant discharge without odor, cervix:smooth and bulbous, uterus: normal size, shape and contour, non tender, no masses felt, adnexa: no masses or tenderness noted.  Assessment:     Irregular intermenstrual bleeding    Plan:     Check QHCG,TSH and GC/CHL Follow up prn Keep bleeding calendar

## 2013-10-12 NOTE — Patient Instructions (Signed)
Follow up by phone Use condoms Keep bleeding calendar

## 2013-10-13 ENCOUNTER — Telehealth: Payer: Self-pay | Admitting: Adult Health

## 2013-10-13 LAB — GC/CHLAMYDIA PROBE AMP
CT PROBE, AMP APTIMA: NEGATIVE
GC Probe RNA: NEGATIVE

## 2013-10-13 LAB — TSH: TSH: 0.455 u[IU]/mL (ref 0.350–4.500)

## 2013-10-13 LAB — HCG, QUANTITATIVE, PREGNANCY: hCG, Beta Chain, Quant, S: <2 m[IU]/mL

## 2013-10-13 NOTE — Telephone Encounter (Signed)
Pt aware of labs  

## 2013-10-22 ENCOUNTER — Ambulatory Visit: Payer: BC Managed Care – PPO | Admitting: Nurse Practitioner

## 2013-10-29 ENCOUNTER — Ambulatory Visit: Payer: BC Managed Care – PPO | Admitting: Nurse Practitioner

## 2013-11-06 ENCOUNTER — Emergency Department (HOSPITAL_COMMUNITY): Payer: BC Managed Care – PPO

## 2013-11-06 ENCOUNTER — Encounter (HOSPITAL_COMMUNITY): Payer: Self-pay | Admitting: Emergency Medicine

## 2013-11-06 ENCOUNTER — Emergency Department (HOSPITAL_COMMUNITY)
Admission: EM | Admit: 2013-11-06 | Discharge: 2013-11-06 | Disposition: A | Discharge status: Home / Self Care | Payer: BC Managed Care – PPO | Attending: Emergency Medicine | Admitting: Emergency Medicine

## 2013-11-06 DIAGNOSIS — Z8742 Personal history of other diseases of the female genital tract: Secondary | ICD-10-CM | POA: Insufficient documentation

## 2013-11-06 DIAGNOSIS — Z8639 Personal history of other endocrine, nutritional and metabolic disease: Secondary | ICD-10-CM | POA: Insufficient documentation

## 2013-11-06 DIAGNOSIS — F17200 Nicotine dependence, unspecified, uncomplicated: Secondary | ICD-10-CM | POA: Insufficient documentation

## 2013-11-06 DIAGNOSIS — Z872 Personal history of diseases of the skin and subcutaneous tissue: Secondary | ICD-10-CM | POA: Insufficient documentation

## 2013-11-06 DIAGNOSIS — Z3202 Encounter for pregnancy test, result negative: Secondary | ICD-10-CM | POA: Insufficient documentation

## 2013-11-06 DIAGNOSIS — Z79899 Other long term (current) drug therapy: Secondary | ICD-10-CM | POA: Insufficient documentation

## 2013-11-06 DIAGNOSIS — Z8719 Personal history of other diseases of the digestive system: Secondary | ICD-10-CM | POA: Insufficient documentation

## 2013-11-06 DIAGNOSIS — E669 Obesity, unspecified: Secondary | ICD-10-CM | POA: Insufficient documentation

## 2013-11-06 DIAGNOSIS — F341 Dysthymic disorder: Secondary | ICD-10-CM | POA: Insufficient documentation

## 2013-11-06 DIAGNOSIS — R109 Unspecified abdominal pain: Secondary | ICD-10-CM

## 2013-11-06 DIAGNOSIS — Z862 Personal history of diseases of the blood and blood-forming organs and certain disorders involving the immune mechanism: Secondary | ICD-10-CM | POA: Insufficient documentation

## 2013-11-06 LAB — URINALYSIS, ROUTINE W REFLEX MICROSCOPIC
Bilirubin Urine: NEGATIVE
Glucose, UA: NEGATIVE mg/dL
Hgb urine dipstick: NEGATIVE
KETONES UR: NEGATIVE mg/dL
LEUKOCYTES UA: NEGATIVE
NITRITE: NEGATIVE
PH: 6 (ref 5.0–8.0)
Protein, ur: NEGATIVE mg/dL
Specific Gravity, Urine: 1.025 (ref 1.005–1.030)
Urobilinogen, UA: 0.2 mg/dL (ref 0.0–1.0)

## 2013-11-06 LAB — PREGNANCY, URINE: Preg Test, Ur: NEGATIVE

## 2013-11-06 MED ORDER — CEPHALEXIN 500 MG PO CAPS: 500.0000 mg | ORAL_CAPSULE | Freq: Four times a day (QID) | ORAL | Status: DC

## 2013-11-06 MED ORDER — OXYCODONE-ACETAMINOPHEN 5-325 MG PO TABS: 1.0000 | ORAL_TABLET | ORAL | Status: DC | PRN

## 2013-11-06 MED ORDER — OXYCODONE-ACETAMINOPHEN 5-325 MG PO TABS
1.0000 | ORAL_TABLET | Freq: Once | ORAL | Status: AC
  Administered 2013-11-06: 1 via ORAL
  Filled 2013-11-06: qty 1

## 2013-11-06 NOTE — Discharge Instructions (Signed)
The pain you're having, is most likely muscular in origin. Some things that can help include rest, heat, and gentle stretching. If you don't improve, check with your Dr. Lazaro Arms not drive or work while taking the pain medication.    Flank Pain Flank pain refers to pain that is located on the side of the body between the upper abdomen and the back. The pain may occur over a short period of time (acute) or may be long-term or reoccurring (chronic). It may be mild or severe. Flank pain can be caused by many things. CAUSES  Some of the more common causes of flank pain include:  Muscle strains.   Muscle spasms.   A disease of your spine (vertebral disk disease).   A lung infection (pneumonia).   Fluid around your lungs (pulmonary edema).   A kidney infection.   Kidney stones.   A very painful skin rash caused by the chickenpox virus (shingles).   Gallbladder disease.  Boyd care will depend on the cause of your pain. In general,  Rest as directed by your caregiver.  Drink enough fluids to keep your urine clear or pale yellow.  Only take over-the-counter or prescription medicines as directed by your caregiver. Some medicines may help relieve the pain.  Tell your caregiver about any changes in your pain.  Follow up with your caregiver as directed. SEEK IMMEDIATE MEDICAL CARE IF:   Your pain is not controlled with medicine.   You have new or worsening symptoms.  Your pain increases.   You have abdominal pain.   You have shortness of breath.   You have persistent nausea or vomiting.   You have swelling in your abdomen.   You feel faint or pass out.   You have blood in your urine.  You have a fever or persistent symptoms for more than 2 3 days.  You have a fever and your symptoms suddenly get worse. MAKE SURE YOU:   Understand these instructions.  Will watch your condition.  Will get help right away if you are not doing  well or get worse. Document Released: 08/29/2005 Document Revised: 04/01/2012 Document Reviewed: 02/20/2012 [facility] Patient Information 2014 [place].

## 2013-11-06 NOTE — ED Notes (Signed)
Patient with no complaints at this time. Respirations even and unlabored. Skin warm/dry. Discharge instructions reviewed with patient at this time. Patient given opportunity to voice concerns/ask questions. Patient discharged at this time and left Emergency Department with steady gait.   

## 2013-11-06 NOTE — ED Provider Notes (Signed)
CSN: [id number]     Arrival date & time 11/06/13  0721 History   This chart was scribed for Richarda Blade, MD by Jenne Campus, ED Scribe. This patient was seen in room APA03/APA03 and the patient's care was started at 7:34 AM.   Chief Complaint  Patient presents with  . Flank Pain    The history is provided by the patient. No language interpreter was used.    HPI Comments: Marissa Lane is a 23 y.o. female who presents to the Emergency Department complaining of constant, non-radiating left flank pain that started suddenly yesterday. She states that she was sitting in class after approximately 5 hours when the pain started. She states that since the onset the pain has waxed and waned with the majority of the time being more severe. She reports that the pain was a 9 out of 10 upon waking this morning but has decreased to a 6 out of 10 currently. She has tried an ice pack and ibuprofen with mild improvement. She denies any prior episodes or family h/o kidney stones. She denies any urinary urgency, nausea, abdominal pain, fever or frequency as associated symptoms. LNMP was 2 weeks ago.    Past Medical History  Diagnosis Date  . Chest pain   . Palpitations   . Tobacco abuse   . GERD (gastroesophageal reflux disease)   . Obesity   . Acne   . IFG (impaired fasting glucose)   . Hyperinsulinemia   . PCOS (polycystic ovarian syndrome)   . 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 Postpartum depression   . H/O urinary frequency 06/25/2013  . Irregular intermenstrual bleeding 10/12/2013   Past Surgical History  Procedure Laterality Date  . Tonsillectomy    . Skin graft  2009    gum graft  . Cholecystectomy  03/06/2012    Procedure: LAPAROSCOPIC CHOLECYSTECTOMY;  Surgeon: Donato Heinz, MD;  Location: [facility];  Service: General;  Laterality: N/A;  . Esophagogastroduodenoscopy N/A 09/09/2012    Procedure: ESOPHAGOGASTRODUODENOSCOPY (EGD);  Surgeon: Rogene Houston, MD;  Location: [facility]  ENDO SUITE;  Service: Endoscopy;  Laterality: N/A;  340   Family History  Problem Relation Age of Onset  . Adopted: Yes  . Congestive Heart Failure Mother   . Irritable bowel syndrome Mother   . Ulcers Mother   . Anemia Mother   . Hypertension Brother    History  Substance Use Topics  . Smoking status: Current Every Day Smoker -- 1.00 packs/day for 5 years    Types: Cigarettes  . Smokeless tobacco: Never Used  . Alcohol Use: No   OB History   Grav Para Term Preterm Abortions TAB SAB Ect Mult Living   1 1        1      Review of Systems  Constitutional: Negative for fever.  Gastrointestinal: Negative for nausea and abdominal pain.  Genitourinary: Positive for flank pain. Negative for urgency and frequency.  All other systems reviewed and are negative.   Allergies  Cymbalta; Nexplanon; Vicodin; and Zofran  Home Medications   Prior to Admission medications   Medication Sig Start Date End Date Taking? Authorizing Provider  ibuprofen (ADVIL,MOTRIN) 200 MG tablet Take 400 mg by mouth every 6 (six) hours as needed. For pain    Historical Provider, MD  Multiple Vitamins-Minerals (MULTIVITAMIN WITH MINERALS) tablet Take 1 tablet by mouth daily.    Historical Provider, MD  sertraline (ZOLOFT) 50 MG tablet Take  1 tablet (50 mg total) by mouth daily. 09/22/13 09/22/14  Nilda Simmer, NP   Triage Vitals: BP 122/78  Pulse 97  Temp(Src) 98.4 F (36.9 C) (Oral)  Resp 16  Ht 5\' 4"  (1.626 m)  Wt 192 lb (87.091 kg)  BMI 32.94 kg/m2  SpO2 100%  LMP 10/23/2013  Physical Exam  Nursing note and vitals reviewed. Constitutional: She is oriented to person, place, and time. She appears well-developed and well-nourished. No distress.  HENT:  Head: Normocephalic and atraumatic.  Eyes: Conjunctivae and EOM are normal.  Neck: Neck supple. No tracheal deviation present.  Cardiovascular: Normal rate and regular rhythm.   Pulmonary/Chest: Effort normal and breath sounds normal. No  respiratory distress.  Abdominal: Soft. She exhibits no distension. There is tenderness.  Mild left lateral abdominal wall tenderness   Genitourinary:  Mild CVA tenderness to percussion.   Musculoskeletal: Normal range of motion.  Neurological: She is alert and oriented to person, place, and time. No sensory deficit.  Skin: Skin is dry.  Psychiatric: She has a normal mood and affect. Her behavior is normal.    ED Course  Procedures (including critical care time)  Medications  oxyCODONE-acetaminophen (PERCOCET/ROXICET) 5-325 MG per tablet 1 tablet (1 tablet Oral Given 11/06/13 0749)    DIAGNOSTIC STUDIES: Oxygen Saturation is 100% on RA, normal by my interpretation.    COORDINATION OF CARE: 7:39 AM-Discussed CT scan versus Ultrasound. Pt's questions answered to apparent satisfaction. Discussed treatment plan which includes CT scan and pain medications with pt at bedside and pt agreed to plan.  9:47 AM-Pt rechecked and feels improved with napping and Percocet. Informed pt of radiology and lab work results. Findings are suggestive of musculoskeletal pain. Discussed discharge plan which includes rest and Percocet with pt and pt agreed to plan. Also advised pt to follow up as needed and pt agreed. Addressed symptoms to return for with pt. Will provide a work note for the next 2 days.  Nursing Notes Reviewed/ Care Coordinated Applicable Imaging Reviewed Interpretation of Laboratory Data incorporated into ED treatment Radiologic imaging report reviewed and images by CT - viewed, by me.  Labs Review Labs Reviewed  URINALYSIS, ROUTINE W REFLEX MICROSCOPIC - Abnormal; Notable for the following:    APPearance HAZY (*)    All other components within normal limits  PREGNANCY, URINE    Imaging Review Ct Abdomen Pelvis Wo Contrast  11/06/2013   CLINICAL DATA:  Left flank pain.  EXAM: CT ABDOMEN AND PELVIS WITHOUT CONTRAST  TECHNIQUE: Multidetector CT imaging of the abdomen and pelvis was  performed following the standard protocol without intravenous contrast.  COMPARISON:  US ABDOMEN COMPLETE dated 03/30/2011  FINDINGS: The noncontrast CT appearance of the liver, spleen, pancreas, and adrenal glands is within normal limits. Gallbladder surgically absent.  No hydronephrosis or renal calculus. No ureteral calculus observed. No significant perirenal stranding.  Appendix unremarkable.  No dilated bowel.  Low-density, potentially fatty lesions associated with both ovaries, suspicious for bilateral dermoids. Noted no definite abnormal inflammatory stranding around the ovaries. Uterine contour within normal limits. If  No pathologic upper abdominal adenopathy is observed. No pathologic pelvic adenopathy is observed.  IMPRESSION: 1. A specific cause for left flank pain is not identified. 2. 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 Dermoids can predispose to ovarian torsion - although there are no secondary noncontrast CT signs of ovarian inflammation, if the patient's pain is pelvic in origin, pelvic sonography may be  warranted.   Electronically Signed   By: Sherryl Barters M.D.   On: 11/06/2013 08:29     EKG Interpretation None      MDM   Plan: Home Medications- Percocet 5-325 mg; Home Treatments- rest, use heat and take Percocet PRN; return here if the recommended treatment, does not improve the symptoms; Recommended follow up- with PCP as needed  Final diagnoses:  Flank pain   Nonspecific flank pain, most likely musculoskeletal with negative Urinalysis , and CT abdomen. Doubt UTI, passed kidney stone or occult fracture  Nursing Notes Reviewed/ Care Coordinated, and agree without changes. Applicable Imaging Reviewed.  Interpretation of Laboratory Data incorporated into ED treatment      I personally performed the services described in this documentation, which was scribed in my presence. The recorded  information has been reviewed and is accurate.      Richarda Blade, MD 11/06/13 [id number]

## 2013-11-06 NOTE — ED Notes (Signed)
Patient c/o left flank pain that started yesterday. Denies any nausea, vomiting, fevers, pain with urination, or blood in urine. Per patient pain better with ambulating/worse with sitting.

## 2013-11-08 ENCOUNTER — Ambulatory Visit (INDEPENDENT_AMBULATORY_CARE_PROVIDER_SITE_OTHER): Payer: BC Managed Care – PPO | Admitting: Obstetrics & Gynecology

## 2013-11-08 ENCOUNTER — Encounter: Payer: Self-pay | Admitting: Obstetrics & Gynecology

## 2013-11-08 VITALS — BP 130/80 | Wt 191.0 lb

## 2013-11-08 DIAGNOSIS — M62830 Muscle spasm of back: Secondary | ICD-10-CM

## 2013-11-08 DIAGNOSIS — M5380 Other specified dorsopathies, site unspecified: Secondary | ICD-10-CM

## 2013-11-08 MED ORDER — CYCLOBENZAPRINE HCL 10 MG PO TABS: 10.0000 mg | ORAL_TABLET | Freq: Three times a day (TID) | ORAL | Status: DC | PRN

## 2013-11-08 NOTE — Progress Notes (Signed)
Patient ID: Marissa Lane, female   DOB: [birth year], 23 y.o.   MRN: [id number] Left back paraspinous muscle spasm 2 areas  Recommend home exercises and lacrosse ball/wall therapy  Follow up 3 weeks  Past Medical History  Diagnosis Date  . Chest pain   . Palpitations   . Tobacco abuse   . GERD (gastroesophageal reflux disease)   . Obesity   . Acne   . IFG (impaired fasting glucose)   . Hyperinsulinemia   . PCOS (polycystic ovarian syndrome)   . Dysthymic disorder     over dose age 80  . Postpartum depression   . H/O urinary frequency 06/25/2013  . Irregular intermenstrual bleeding 10/12/2013    Past Surgical History  Procedure Laterality Date  . Tonsillectomy    . Skin graft  2009    gum graft  . Cholecystectomy  03/06/2012    Procedure: LAPAROSCOPIC CHOLECYSTECTOMY;  Surgeon: Donato Heinz, MD;  Location: [facility];  Service: General;  Laterality: N/A;  . Esophagogastroduodenoscopy N/A 09/09/2012    Procedure: ESOPHAGOGASTRODUODENOSCOPY (EGD);  Surgeon: Rogene Houston, MD;  Location: [facility];  Service: Endoscopy;  Laterality: N/A;  340    OB History   Grav Para Term Preterm Abortions TAB SAB Ect Mult Living   1 1        1       Allergies  Allergen Reactions  . Cymbalta [Duloxetine Hcl] Other (See Comments)    Worsening depression  . Nexplanon [Etonogestrel] Other (See Comments)    depression  . Vicodin [Hydrocodone-Acetaminophen] Other (See Comments)    Bad migraine  . Zofran [Ondansetron Hcl]     History   Social History  . Marital Status: Single    Spouse Name: N/A    Number of Children: 1  . Years of Education: N/A   Occupational History  . Certified nursing assistant     [employer]   Social History Main Topics  . Smoking status: Current Every Day Smoker -- 1.00 packs/day for 5 years    Types: Cigarettes  . Smokeless tobacco: Never Used  . Alcohol Use: No  . Drug Use: No  . Sexual Activity: Yes    Birth Control/ Protection: Condom   Other  Topics Concern  . None   Social History Narrative  . None    Family History  Problem Relation Age of Onset  . Adopted: Yes  . Congestive Heart Failure Mother   . Irritable bowel syndrome Mother   . Ulcers Mother   . Anemia Mother   . Hypertension Brother

## 2013-11-22 ENCOUNTER — Telehealth: Payer: Self-pay

## 2013-11-22 NOTE — Telephone Encounter (Signed)
Pt has not rx for over a year for the ring make appt for pap and physical and will rx it then

## 2013-11-29 ENCOUNTER — Encounter: Payer: Self-pay | Admitting: Obstetrics & Gynecology

## 2013-11-29 ENCOUNTER — Ambulatory Visit (INDEPENDENT_AMBULATORY_CARE_PROVIDER_SITE_OTHER): Payer: BC Managed Care – PPO | Admitting: Obstetrics & Gynecology

## 2013-11-29 VITALS — BP 130/80 | Ht 64.0 in | Wt 196.0 lb

## 2013-11-29 DIAGNOSIS — M549 Dorsalgia, unspecified: Secondary | ICD-10-CM

## 2013-11-29 MED ORDER — ETONOGESTREL-ETHINYL ESTRADIOL 0.12-0.015 MG/24HR VA RING: VAGINAL_RING | VAGINAL | Status: DC

## 2013-11-29 NOTE — Progress Notes (Signed)
Patient ID: Marissa Lane, female   DOB: [birth year], 23 y.o.   MRN: [id number] Back is better no trigger points today Encouraged to do fitness blender  Pt wants to restart nuva ring  Past Medical History  Diagnosis Date  . Chest pain   . Palpitations   . Tobacco abuse   . GERD (gastroesophageal reflux disease)   . Obesity   . Acne   . IFG (impaired fasting glucose)   . Hyperinsulinemia   . PCOS (polycystic ovarian syndrome)   . Dysthymic disorder     over dose age 62  . Postpartum depression   . H/O urinary frequency 06/25/2013  . Irregular intermenstrual bleeding 10/12/2013    Past Surgical History  Procedure Laterality Date  . Tonsillectomy    . Skin graft  2009    gum graft  . Cholecystectomy  03/06/2012    Procedure: LAPAROSCOPIC CHOLECYSTECTOMY;  Surgeon: Donato Heinz, MD;  Location: [facility];  Service: General;  Laterality: N/A;  . Esophagogastroduodenoscopy N/A 09/09/2012    Procedure: ESOPHAGOGASTRODUODENOSCOPY (EGD);  Surgeon: Rogene Houston, MD;  Location: [facility];  Service: Endoscopy;  Laterality: N/A;  340  . Mouth surgery      OB History   Grav Para Term Preterm Abortions TAB SAB Ect Mult Living   1 1        1       Allergies  Allergen Reactions  . Cymbalta [Duloxetine Hcl] Other (See Comments)    Worsening depression  . Vicodin [Hydrocodone-Acetaminophen] Other (See Comments)    Bad migraine  . Zofran [Ondansetron Hcl]     History   Social History  . Marital Status: Single    Spouse Name: N/A    Number of Children: 1  . Years of Education: N/A   Occupational History  . Certified nursing assistant     [employer]   Social History Main Topics  . Smoking status: Current Every Day Smoker -- 1.00 packs/day for 5 years    Types: Cigarettes  . Smokeless tobacco: Never Used  . Alcohol Use: No  . Drug Use: No  . Sexual Activity: Yes    Birth Control/ Protection: Condom   Other Topics Concern  . None   Social History Narrative  . None     Family History  Problem Relation Age of Onset  . Adopted: Yes  . Congestive Heart Failure Mother   . Irritable bowel syndrome Mother   . Ulcers Mother   . Anemia Mother   . Hypertension Brother

## 2014-01-24 ENCOUNTER — Telehealth: Payer: Self-pay | Admitting: Obstetrics & Gynecology

## 2014-01-24 NOTE — Telephone Encounter (Signed)
Pt wanted to ask questions about plan B. Pt states that a condom broke last night, pt is not on BC. Pt states that according to her ovulation calendar that she should have ovulated on Saturday and was unsure of what to do. The pt wanted [place] to write an Rx for Plan B so her insurance would cover it.  I spoke with JAG and she advised that it is OTC and her insurance will not cover it any way, and that she should do a UPT before taking Plan B. Pt was advised of this and stated that money was tight and that she didn't know what she was going to do. Pt verbalized understanding.

## 2014-04-04 ENCOUNTER — Encounter: Payer: Self-pay | Admitting: Obstetrics and Gynecology

## 2014-04-04 ENCOUNTER — Ambulatory Visit (INDEPENDENT_AMBULATORY_CARE_PROVIDER_SITE_OTHER): Payer: BC Managed Care – PPO | Admitting: Obstetrics and Gynecology

## 2014-04-04 VITALS — BP 120/76 | Ht 64.0 in

## 2014-04-04 DIAGNOSIS — N912 Amenorrhea, unspecified: Secondary | ICD-10-CM

## 2014-04-04 DIAGNOSIS — Z3202 Encounter for pregnancy test, result negative: Secondary | ICD-10-CM

## 2014-04-04 DIAGNOSIS — N926 Irregular menstruation, unspecified: Secondary | ICD-10-CM

## 2014-04-04 DIAGNOSIS — Z3200 Encounter for pregnancy test, result unknown: Secondary | ICD-10-CM

## 2014-04-04 LAB — POCT URINE PREGNANCY: Preg Test, Ur: NEGATIVE

## 2014-04-04 NOTE — Progress Notes (Signed)
Patient ID: Marissa Lane, female   DOB: 20-Jan-1991, 23 y.o.   MRN: [id number]   [facility] Visit  Patient name: Marissa Lane MRN [id number]  Date of birth: 1991-06-14  CC & HPI:  Marissa Lane is a 23 y.o. female presenting today for amenorrhea. Pt states that she has a history of normal menstrual cycles but has not had a period since July 13.  She has experienced some cramping.  She is sexually active without birth control.  She had sex 4 days ago.  She has experienced these symptoms before when she was pregnant with her daughter.  At that time, she had a positive pregnancy test 2 and a half weeks after her symptoms started.     ROS:  All systems are reviewed and negative unless otherwise specified in the HPI.  Pertinent History Reviewed:   Reviewed: Significant for  Medical         Past Medical History  Diagnosis Date  . Chest pain   . Palpitations   . Tobacco abuse   . GERD (gastroesophageal reflux disease)   . Obesity   . Acne   . IFG (impaired fasting glucose)   . Hyperinsulinemia   . PCOS (polycystic ovarian syndrome)   . Dysthymic disorder     over dose age 52  . Postpartum depression   . H/O urinary frequency 06/25/2013  . Irregular intermenstrual bleeding 10/12/2013                              Surgical Hx:    Past Surgical History  Procedure Laterality Date  . Tonsillectomy    . Skin graft  2009    gum graft  . Cholecystectomy  03/06/2012    Procedure: LAPAROSCOPIC CHOLECYSTECTOMY;  Surgeon: Donato Heinz, MD;  Location: [facility];  Service: General;  Laterality: N/A;  . Esophagogastroduodenoscopy N/A 09/09/2012    Procedure: ESOPHAGOGASTRODUODENOSCOPY (EGD);  Surgeon: Rogene Houston, MD;  Location: [facility];  Service: Endoscopy;  Laterality: N/A;  340  . Mouth surgery     Medications: Reviewed & Updated - see associated section                      Current outpatient prescriptions:ibuprofen (ADVIL,MOTRIN) 200 MG tablet, Take 400 mg by mouth every  6 (six) hours as needed. For pain, Disp: , Rfl:    Social History: Reviewed -  reports that she has been smoking Cigarettes.  She has a 5 pack-year smoking history. She has never used smokeless tobacco.  Objective Findings:  Vitals: Blood pressure 120/76, height 5\' 4"  (1.626 m), last menstrual period 01/31/2014.  Physical Examination: Conversation only.Physical Examination: General appearance - alert, well appearing, and in no distress, oriented to person, place, and time, overweight  Mental status - alert, oriented to person, place, and time, normal mood, behavior, speech, dress, motor activity, and thought processes     Assessment & Plan:   A:  1. Amenorrhea suspect anovulation  2. Currently sexually active w/o birth control  P:  1. Follow-up in 2 weeks for morning appointment for blood pregnancy test and Provera prescription.       This chart was scribed by Donato Schultz, Medical Scribe, for Dr. Mallory Shirk on 04/04/2014 at 1:57 PM. This chart was reviewed by Dr. Mallory Shirk for accuracy.

## 2014-04-18 ENCOUNTER — Other Ambulatory Visit: Payer: BC Managed Care – PPO

## 2014-04-18 DIAGNOSIS — N912 Amenorrhea, unspecified: Secondary | ICD-10-CM

## 2014-04-19 ENCOUNTER — Telehealth: Payer: Self-pay | Admitting: *Deleted

## 2014-04-19 LAB — HCG, QUANTITATIVE, PREGNANCY

## 2014-04-19 NOTE — Telephone Encounter (Signed)
Per pt request, I LMOM that labs she had drawn in the office were negative and that if she had any questions to please call us back.

## 2014-04-21 ENCOUNTER — Telehealth: Payer: Self-pay | Admitting: Adult Health

## 2014-04-21 NOTE — Telephone Encounter (Signed)
Left message x 1. JSY 

## 2014-04-22 NOTE — Telephone Encounter (Signed)
Spoke with pt. 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 Pt wants to have an [place] to check for this. I advised she would need an appt to talk to one of the providers. Pt wants to see JAG. Pt was going to class, so she will call back to schedule an appt. [place]

## 2014-04-25 ENCOUNTER — Other Ambulatory Visit: Payer: BC Managed Care – PPO | Admitting: Adult Health

## 2014-05-09 ENCOUNTER — Encounter: Payer: Self-pay | Admitting: Nurse Practitioner

## 2014-05-09 ENCOUNTER — Ambulatory Visit (INDEPENDENT_AMBULATORY_CARE_PROVIDER_SITE_OTHER): Payer: BC Managed Care – PPO | Admitting: Nurse Practitioner

## 2014-05-09 VITALS — BP 122/70 | Ht 64.0 in | Wt 195.0 lb

## 2014-05-09 DIAGNOSIS — E282 Polycystic ovarian syndrome: Secondary | ICD-10-CM

## 2014-05-09 DIAGNOSIS — Z1151 Encounter for screening for human papillomavirus (HPV): Secondary | ICD-10-CM

## 2014-05-09 DIAGNOSIS — Z113 Encounter for screening for infections with a predominantly sexual mode of transmission: Secondary | ICD-10-CM

## 2014-05-09 DIAGNOSIS — Z0000 Encounter for general adult medical examination without abnormal findings: Secondary | ICD-10-CM

## 2014-05-09 DIAGNOSIS — N939 Abnormal uterine and vaginal bleeding, unspecified: Secondary | ICD-10-CM

## 2014-05-09 DIAGNOSIS — Z79899 Other long term (current) drug therapy: Secondary | ICD-10-CM

## 2014-05-09 DIAGNOSIS — Z124 Encounter for screening for malignant neoplasm of cervix: Secondary | ICD-10-CM

## 2014-05-09 DIAGNOSIS — Z23 Encounter for immunization: Secondary | ICD-10-CM

## 2014-05-09 MED ORDER — PHENTERMINE HCL 37.5 MG PO TABS: 37.5000 mg | ORAL_TABLET | Freq: Every day | ORAL | Status: DC

## 2014-05-09 MED ORDER — ETONOGESTREL-ETHINYL ESTRADIOL 0.12-0.015 MG/24HR VA RING: VAGINAL_RING | VAGINAL | Status: DC

## 2014-05-09 NOTE — Patient Instructions (Signed)
Pills nuvaring Depo provera nexplanon skyla  mirena

## 2014-05-10 LAB — HEPATIC FUNCTION PANEL
ALT: 29 U/L (ref 0–35)
AST: 21 U/L (ref 0–37)
Albumin: 4 g/dL (ref 3.5–5.2)
Alkaline Phosphatase: 62 U/L (ref 39–117)
BILIRUBIN DIRECT: 0.1 mg/dL (ref 0.0–0.3)
Indirect Bilirubin: 0.2 mg/dL (ref 0.2–1.2)
TOTAL PROTEIN: 6.5 g/dL (ref 6.0–8.3)
Total Bilirubin: 0.3 mg/dL (ref 0.2–1.2)

## 2014-05-10 LAB — LIPID PANEL
Cholesterol: 177 mg/dL (ref 0–200)
HDL: 37 mg/dL — ABNORMAL LOW (ref >39)
LDL CALC: 99 mg/dL (ref 0–99)
TRIGLYCERIDES: 205 mg/dL — AB (ref <150)
Total CHOL/HDL Ratio: 4.8 Ratio
VLDL: 41 mg/dL — AB (ref 0–40)

## 2014-05-10 LAB — HEMOGLOBIN A1C
HEMOGLOBIN A1C: 5.7 % — AB (ref <5.7)
MEAN PLASMA GLUCOSE: 117 mg/dL — AB (ref <117)

## 2014-05-10 LAB — TSH: TSH: 0.941 u[IU]/mL (ref 0.350–4.500)

## 2014-05-10 LAB — TESTOSTERONE: Testosterone: 67 ng/dL (ref 10–70)

## 2014-05-11 LAB — PAP IG, CT-NG NAA, HPV HIGH-RISK
Chlamydia Probe Amp: NEGATIVE
GC Probe Amp: NEGATIVE
HPV DNA HIGH RISK: NOT DETECTED

## 2014-05-11 LAB — INSULIN, FASTING: Insulin fasting, serum: 15.7 u[IU]/mL (ref 2.0–19.6)

## 2014-05-12 ENCOUNTER — Encounter: Payer: Self-pay | Admitting: Nurse Practitioner

## 2014-05-12 NOTE — Progress Notes (Signed)
Subjective:    Patient ID: Marissa Lane, female    DOB: 08-02-1990, 23 y.o.   MRN: [id number]  HPI  Presents for her wellness exam. Usually has regular cycles but skipped July to October 2nd when she had a normal cycle. Has a history of PCOS. Regular vision and dental exams. Same sexual partner. Uses condoms consistently but one was broken. Requesting STD testing as a precaution. Some activity. Doing well with diet. Wants to try phentermine again. Has taken this without difficulty in the past.     Review of Systems  Constitutional: Negative for fever, activity change, appetite change and fatigue.  HENT: Negative for dental problem, ear pain, sinus pressure and sore throat.   Respiratory: Negative for cough, chest tightness, shortness of breath and wheezing.   Cardiovascular: Negative for chest pain.  Gastrointestinal: Negative for nausea, vomiting, diarrhea, constipation and abdominal distention.  Genitourinary: Positive for menstrual problem. Negative for dysuria, urgency, frequency, vaginal discharge, enuresis, difficulty urinating, genital sores and pelvic pain.       Objective:   Physical Exam  Vitals reviewed. Constitutional: She is oriented to person, place, and time. She appears well-developed. No distress.  HENT:  Right Ear: External ear normal.  Left Ear: External ear normal.  Mouth/Throat: Oropharynx is clear and moist.  Neck: Normal range of motion. Neck supple. No tracheal deviation present. No thyromegaly present.  Cardiovascular: Normal rate, regular rhythm and normal heart sounds.  Exam reveals no gallop.   No murmur heard. Pulmonary/Chest: Effort normal and breath sounds normal.  Abdominal: Soft. She exhibits no distension. There is no tenderness.  Genitourinary: Vagina normal and uterus normal. No vaginal discharge found.  External GU: no rashes or lesions. Vagina no discharge. Cervix normal, no CMT. Bimanual exam: no tenderness or obvious masses.    Musculoskeletal: She exhibits no edema.  Lymphadenopathy:    She has no cervical adenopathy.  Neurological: She is alert and oriented to person, place, and time.  Skin: Skin is warm and dry. No rash noted.  Psychiatric: She has a normal mood and affect. Her behavior is normal.  Breast exam: dense tissue; minimal nodularity. Axillae no adenopathy.        Assessment & Plan:   Problem List Items Addressed This Visit     Endocrine   PCOS (polycystic ovarian syndrome)   Relevant Orders      TSH (Completed)      Hemoglobin A1c (Completed)      Insulin, Fasting (Completed)      Testosterone (Completed)     Other   Morbid obesity   Relevant Medications      phentermine (ADIPEX-P) 37.5 MG tablet   Other Relevant Orders      TSH (Completed)    Other Visit Diagnoses   Routine general medical examination at a health care facility    -  Primary    Relevant Orders       Lipid panel (Completed)    Screen for STD (sexually transmitted disease)        Relevant Orders       Pap IG, CT/NG NAA, and HPV (high risk) (Completed)    Screening for cervical cancer        Relevant Orders       Pap IG, CT/NG NAA, and HPV (high risk) (Completed)    Screening for HPV (human papillomavirus)        Relevant Orders       Pap IG, CT/NG NAA, and HPV (high  risk) (Completed)    Abnormal uterine bleeding        High risk medication use        Relevant Orders       Hepatic function panel (Completed)    Encounter for immunization          Meds ordered this encounter  Medications  . etonogestrel-ethinyl estradiol (NUVARING) 0.12-0.015 MG/24HR vaginal ring    Sig: Insert vaginally and leave in place for 3 consecutive weeks, then remove for 1 week.    Dispense:  1 each    Refill:  12    Order Specific Question:  Supervising Provider    Answer:  Mikey Kirschner [[id number]]  . phentermine (ADIPEX-P) 37.5 MG tablet    Sig: Take 1 tablet (37.5 mg total) by mouth daily before breakfast.    Dispense:  30  tablet    Refill:  2    Order Specific Question:  Supervising Provider    Answer:  Mikey Kirschner [[id number]]   Would like to restart Nuvaring. Discussed safe sex issues. Recommend healthy diet, regular activity and weight loss.  Return in about 3 months (around 08/09/2014).

## 2014-05-13 NOTE — Progress Notes (Signed)
Patient notified and verbalized understanding of the test results. No further questions. 

## 2014-05-23 ENCOUNTER — Encounter: Payer: Self-pay | Admitting: Nurse Practitioner

## 2014-12-02 ENCOUNTER — Ambulatory Visit: Payer: Self-pay | Admitting: Nurse Practitioner

## 2014-12-12 ENCOUNTER — Encounter: Payer: Self-pay | Admitting: Nurse Practitioner

## 2014-12-12 ENCOUNTER — Ambulatory Visit (INDEPENDENT_AMBULATORY_CARE_PROVIDER_SITE_OTHER): Payer: BLUE CROSS/BLUE SHIELD | Admitting: Nurse Practitioner

## 2014-12-12 VITALS — BP 132/84 | Ht 64.0 in | Wt 204.2 lb

## 2014-12-12 DIAGNOSIS — E282 Polycystic ovarian syndrome: Secondary | ICD-10-CM

## 2014-12-12 DIAGNOSIS — Z139 Encounter for screening, unspecified: Secondary | ICD-10-CM | POA: Diagnosis not present

## 2014-12-13 LAB — RUBELLA SCREEN

## 2014-12-13 LAB — VARICELLA ZOSTER ANTIBODY, IGG: Varicella zoster IgG: 298 index (ref >165)

## 2014-12-15 ENCOUNTER — Encounter: Payer: Self-pay | Admitting: Nurse Practitioner

## 2014-12-15 NOTE — Progress Notes (Signed)
Subjective:  Presents for routine follow up. Had her physical in October; needs papers filled out today for school. Regular cycles, normal flow. Same partner. Using condoms and calendar method for [facility]. Defers other contraceptives at this time. Staying active. Struggling with her weight.   Objective:   BP 132/84 mmHg  Ht 5\' 4"  (1.626 m)  Wt 204 lb 4 oz (92.647 kg)  BMI 35.04 kg/m2 NAD. Alert, oriented. HEENT normal. Thyroid nl to palpation and non tender. Lungs clear. Heart RRR. Abdomen soft, non tender. Neurologic exam normal. See remainder of exam from October.  Assessment:  Problem List Items Addressed This Visit      Endocrine   PCOS (polycystic ovarian syndrome) - Primary     Other   Morbid obesity    Other Visit Diagnoses    Screening        Relevant Orders    Varicella zoster antibody, IgG (Completed)    Rubella screen (Completed)      Plan: Discussed importance of healthy diet regular activity and weight loss. Recheck in the fall for physical.

## 2014-12-26 ENCOUNTER — Telehealth: Payer: Self-pay | Admitting: Family Medicine

## 2014-12-26 NOTE — Telephone Encounter (Signed)
Pt is requesting a prescription for the ruebella vaccine. Pt states that she found a place that will give it but she needs a script for it.

## 2014-12-26 NOTE — Telephone Encounter (Signed)
Patient had inadequate immunity to rubella on titer

## 2014-12-26 NOTE — Telephone Encounter (Signed)
Rubella vaccine prescription may be rewritten I will be happy to sign

## 2014-12-26 NOTE — Telephone Encounter (Signed)
Please give the patient prescription for this you may want to clarify with her if it's just her rubella or MMR.

## 2014-12-27 NOTE — Telephone Encounter (Signed)
Pt notified on voicemail that script is ready for pickup.  

## 2015-01-06 ENCOUNTER — Telehealth: Payer: Self-pay | Admitting: Family Medicine

## 2015-01-06 ENCOUNTER — Telehealth: Payer: Self-pay | Admitting: *Deleted

## 2015-01-06 NOTE — Telephone Encounter (Signed)
Patient is having some back pain that she was seen previously for.  She was prescribed flexeril and wants to know if this can be refilled.  [employer]

## 2015-01-06 NOTE — Telephone Encounter (Signed)
Not on med list

## 2015-01-06 NOTE — Telephone Encounter (Signed)
Pt requesting refill on Flexeril 10 mg, states Dr. Elonda Husky prescribed in the past. Pt states can she get RX today. Informed pt would route the message to Dr. Elonda Husky, office open till 2 pm today. Pt states will call her PCP and see if he will prescribe.

## 2015-01-09 ENCOUNTER — Encounter: Payer: Self-pay | Admitting: Family Medicine

## 2015-01-09 MED ORDER — CYCLOBENZAPRINE HCL 10 MG PO TABS: 10.0000 mg | ORAL_TABLET | Freq: Three times a day (TID) | ORAL | Status: DC | PRN

## 2015-01-09 NOTE — Telephone Encounter (Signed)
May ref flexeril 10 mg one tid prn spasm numb 30

## 2015-01-09 NOTE — Telephone Encounter (Signed)
Rx sent electronically to pharmacy. Patient notified. 

## 2015-02-01 ENCOUNTER — Ambulatory Visit (INDEPENDENT_AMBULATORY_CARE_PROVIDER_SITE_OTHER): Payer: BLUE CROSS/BLUE SHIELD | Admitting: *Deleted

## 2015-02-01 DIAGNOSIS — Z111 Encounter for screening for respiratory tuberculosis: Secondary | ICD-10-CM | POA: Diagnosis not present

## 2015-02-01 DIAGNOSIS — Z23 Encounter for immunization: Secondary | ICD-10-CM

## 2015-02-01 NOTE — Patient Instructions (Signed)
Results for orders placed or performed in visit on 12/12/14  Varicella zoster antibody, IgG  Result Value Ref Range   Varicella zoster IgG 298 Immune >165 index  Rubella screen  Result Value Ref Range   RUBELLA ANTIBODIES, IGG <0.90 (L) Immune >0.99 index

## 2015-02-15 ENCOUNTER — Ambulatory Visit: Payer: BLUE CROSS/BLUE SHIELD

## 2015-02-20 ENCOUNTER — Encounter: Payer: Self-pay | Admitting: Family Medicine

## 2015-02-22 ENCOUNTER — Ambulatory Visit (INDEPENDENT_AMBULATORY_CARE_PROVIDER_SITE_OTHER): Payer: BLUE CROSS/BLUE SHIELD

## 2015-02-22 DIAGNOSIS — Z111 Encounter for screening for respiratory tuberculosis: Secondary | ICD-10-CM

## 2015-02-24 LAB — TB SKIN TEST
INDURATION: 0 mm
TB Skin Test: NEGATIVE

## 2015-03-08 ENCOUNTER — Ambulatory Visit: Payer: BLUE CROSS/BLUE SHIELD

## 2015-03-08 ENCOUNTER — Ambulatory Visit (INDEPENDENT_AMBULATORY_CARE_PROVIDER_SITE_OTHER): Payer: BLUE CROSS/BLUE SHIELD

## 2015-03-08 DIAGNOSIS — Z111 Encounter for screening for respiratory tuberculosis: Secondary | ICD-10-CM

## 2015-03-10 ENCOUNTER — Ambulatory Visit (HOSPITAL_COMMUNITY)
Admission: RE | Admit: 2015-03-10 | Discharge: 2015-03-10 | Disposition: A | Discharge status: Home / Self Care | Payer: BLUE CROSS/BLUE SHIELD | Source: Ambulatory Visit | Attending: Family Medicine | Admitting: Family Medicine

## 2015-03-10 ENCOUNTER — Other Ambulatory Visit: Payer: Self-pay | Admitting: *Deleted

## 2015-03-10 DIAGNOSIS — R7611 Nonspecific reaction to tuberculin skin test without active tuberculosis: Secondary | ICD-10-CM

## 2015-03-10 LAB — TB SKIN TEST
Induration: 5 mm
TB Skin Test: POSITIVE

## 2015-03-16 ENCOUNTER — Ambulatory Visit: Payer: BLUE CROSS/BLUE SHIELD | Admitting: Nurse Practitioner

## 2015-03-16 DIAGNOSIS — Z029 Encounter for administrative examinations, unspecified: Secondary | ICD-10-CM

## 2015-04-04 ENCOUNTER — Ambulatory Visit (INDEPENDENT_AMBULATORY_CARE_PROVIDER_SITE_OTHER): Payer: BLUE CROSS/BLUE SHIELD | Admitting: Nurse Practitioner

## 2015-04-04 ENCOUNTER — Encounter: Payer: Self-pay | Admitting: Nurse Practitioner

## 2015-04-04 VITALS — BP 130/82 | Ht 64.0 in | Wt 203.4 lb

## 2015-04-04 DIAGNOSIS — G43001 Migraine without aura, not intractable, with status migrainosus: Secondary | ICD-10-CM

## 2015-04-04 MED ORDER — RIZATRIPTAN BENZOATE 10 MG PO TBDP: ORAL_TABLET | ORAL | Status: DC

## 2015-04-04 MED ORDER — NAPROXEN 500 MG PO TABS: ORAL_TABLET | ORAL | Status: DC

## 2015-04-04 NOTE — Patient Instructions (Signed)

## 2015-04-06 ENCOUNTER — Encounter: Payer: Self-pay | Admitting: Nurse Practitioner

## 2015-04-06 DIAGNOSIS — G43001 Migraine without aura, not intractable, with status migrainosus: Secondary | ICD-10-CM | POA: Insufficient documentation

## 2015-04-06 NOTE — Progress Notes (Signed)
Subjective:  Presents for complaints of migraine headaches over the past 2 months. Occurs about once a week. Gradual onset. Describes as an intense constant pressure. Photosensitivity. Phonophobia. Very nauseated, no vomiting. No aura identified. Some blurred vision. No other visual changes. No numbness or weakness of the face arms or legs. No difficulty speaking or swallowing. Describes as 7 out of 10 on a pain scale. Difficulty concentrating. Has not identified any specific triggers. Has had increased fatigue. Minimal improvement with Tylenol or Advil. Better with rest in a quiet dark room. Drinks a large amount of caffeine. Avoid daily analgesic use. Sexually active, uses condoms for birth control. Mostly regular cycles lasting about 4 days. Slightly heavy flow at times. Possible increase in headaches during that time. No specific menstrual migraines. PMH for migraines: Grandmother. Does not have a headache today. No change in stress but patient has a job, going to school and has a small child at home.   Objective:   BP 130/82 mmHg  Ht 5\' 4"  (1.626 m)  Wt 203 lb 6.4 oz (92.262 kg)  BMI 34.90 kg/m2  NAD. Alert, oriented. Lungs clear. Heart regular rate rhythm.  Assessment:  Problem List Items Addressed This Visit      Cardiovascular and Mediastinum   Migraine without aura and with status migrainosus, not intractable - Primary   Relevant Medications   rizatriptan (MAXALT-MLT) 10 MG disintegrating tablet   naproxen (NAPROSYN) 500 MG tablet     Plan:  Meds ordered this encounter  Medications  . rizatriptan (MAXALT-MLT) 10 MG disintegrating tablet    Sig: One at onset of migraine; May repeat in 2 hours if needed; max 2 per 24 hours    Dispense:  10 tablet    Refill:  0    Order Specific Question:  Supervising Provider    Answer:  Mikey Kirschner [[id number]]  . naproxen (NAPROSYN) 500 MG tablet    Sig: One po BID prn migraines    Dispense:  30 tablet    Refill:  0    Order Specific  Question:  Supervising Provider    Answer:  Mikey Kirschner [[id number]]    reviewed potential adverse effects of rizatriptan. Take 1 tablet along with a naproxen at onset of migraine. Reviewed warning signs. Patient to keep a headache diary. Bring to next visit. Call or go to ED sooner if any problems. Return in about 4 weeks (around 05/02/2015) for migraine recheck.

## 2015-05-01 ENCOUNTER — Ambulatory Visit: Payer: BLUE CROSS/BLUE SHIELD | Admitting: Nurse Practitioner

## 2015-05-09 ENCOUNTER — Ambulatory Visit (INDEPENDENT_AMBULATORY_CARE_PROVIDER_SITE_OTHER): Payer: BLUE CROSS/BLUE SHIELD | Admitting: Family Medicine

## 2015-05-09 ENCOUNTER — Encounter: Payer: Self-pay | Admitting: Family Medicine

## 2015-05-09 VITALS — Temp 98.4°F | Ht 64.0 in | Wt 203.4 lb

## 2015-05-09 DIAGNOSIS — L739 Follicular disorder, unspecified: Secondary | ICD-10-CM | POA: Diagnosis not present

## 2015-05-09 DIAGNOSIS — E785 Hyperlipidemia, unspecified: Secondary | ICD-10-CM | POA: Diagnosis not present

## 2015-05-09 DIAGNOSIS — R739 Hyperglycemia, unspecified: Secondary | ICD-10-CM | POA: Diagnosis not present

## 2015-05-09 DIAGNOSIS — Z79899 Other long term (current) drug therapy: Secondary | ICD-10-CM

## 2015-05-09 DIAGNOSIS — E282 Polycystic ovarian syndrome: Secondary | ICD-10-CM

## 2015-05-09 MED ORDER — DOXYCYCLINE HYCLATE 100 MG PO CAPS: 100.0000 mg | ORAL_CAPSULE | Freq: Two times a day (BID) | ORAL | Status: DC

## 2015-05-09 NOTE — Progress Notes (Signed)
   Subjective:    Patient ID: Marissa Lane, female    DOB: January 20, 1991, 24 y.o.   MRN: [id number]  HPI Patient has a possible spider bite on her left lower leg. Redness and pain noted. Watery discharge. Warm to touch. Treatments tried: antibiotic ointment. No relief.   Patient states that she has no other concerns at this time.  Patient also states that her calf feels tight she's having a stretcher because she's been having some cramps  Review of Systems Patient with significant pain discomfort where the lesion is. Denies prior knee pain does relate calf feels tight cramping some. Denies fever chills.    Objective:   Physical Exam  On physical examination red Mary on the left lower leg is tender does have a clearish whitish drainage is no sign of abscess. Calf is normal. Ankle normal.      Assessment & Plan:  Intermittent cramps of the leg stretching excises recommended  Significant lesions seen on the left lower leg culture taken. Possible abscess. Doxycycline twice a day next 10 days follow-up if ongoing troubles warning signs were discussed possibility of spider bite possibility of erosions in the skin related to this she will let us know if any problems  Patient with polycystic ovary syndrome she does need to check her kidney functions as well as hemoglobin A1c and lipid profile views warm

## 2015-05-10 ENCOUNTER — Telehealth: Payer: Self-pay | Admitting: Family Medicine

## 2015-05-10 NOTE — Telephone Encounter (Signed)
Pt called to let Dr. Nicki Reaper know that another place has popped up on the same leg. Pt was seen yesterday.

## 2015-05-12 NOTE — Telephone Encounter (Signed)
The antibiotics should take care of this area please discuss with the patient if these areas become progressively worse may need to be rechecked

## 2015-05-12 NOTE — Telephone Encounter (Signed)
Spoke with patient and patient stated that the location in which she was seen for looks no better or worse. Her concern is a new site near location. Patient states the new site is warm, red, and tender. No signs of abscess. Spoke with Dr.Scott Luking in real time and was told to inform patient to continue course of ABT if no improvements by the start of next week or signs of abscess to call back because she may need to be seen. Also advised patient to continue using warm compresses. Patient verbalized understanding.

## 2015-05-13 LAB — WOUND CULTURE

## 2015-05-16 ENCOUNTER — Other Ambulatory Visit: Payer: Self-pay

## 2015-05-16 MED ORDER — AMOXICILLIN-POT CLAVULANATE 875-125 MG PO TABS: 1.0000 | ORAL_TABLET | Freq: Two times a day (BID) | ORAL | Status: DC

## 2015-05-17 ENCOUNTER — Telehealth: Payer: Self-pay | Admitting: Family Medicine

## 2015-05-17 NOTE — Telephone Encounter (Signed)
Patient notified and verbalized understanding. Patient was just making sure it was plain staph and not MRSA.

## 2015-05-17 NOTE — Telephone Encounter (Signed)
Patient would like a nurse to call her, she states that she has a few questions regarding her recent culture results.  States it showed staph and states her questions are general dealing with her work (this is all I got out of her)

## 2015-05-17 NOTE — Telephone Encounter (Signed)
Keep covered with bandage or her pants, if not healed up by end of antibiotic I rec recheck

## 2015-05-17 NOTE — Telephone Encounter (Signed)
Patient wanted to make sure her skin infection was not contagious to her family since patient is on Augmentin.

## 2015-05-22 ENCOUNTER — Ambulatory Visit: Payer: BLUE CROSS/BLUE SHIELD | Admitting: Obstetrics and Gynecology

## 2015-05-29 ENCOUNTER — Other Ambulatory Visit: Payer: BLUE CROSS/BLUE SHIELD | Admitting: Obstetrics and Gynecology

## 2015-06-05 ENCOUNTER — Encounter: Payer: Self-pay | Admitting: Obstetrics and Gynecology

## 2015-06-05 ENCOUNTER — Ambulatory Visit (INDEPENDENT_AMBULATORY_CARE_PROVIDER_SITE_OTHER): Payer: BLUE CROSS/BLUE SHIELD | Admitting: Obstetrics and Gynecology

## 2015-06-05 VITALS — BP 130/82 | Ht 64.0 in

## 2015-06-05 DIAGNOSIS — Z01419 Encounter for gynecological examination (general) (routine) without abnormal findings: Secondary | ICD-10-CM | POA: Diagnosis not present

## 2015-06-05 NOTE — Progress Notes (Signed)
Patient ID: Marissa Lane, female   DOB: 02-11-1991, 24 y.o.   MRN: [id number]  Assessment:  Annual Gyn Exam  desires fertility in 2017 Plan:  1. pap smear done in 2015, next pap due in 2 years 2. return annually or prn 3    Annual mammogram advised Begin prenatal vits. Subjective:  Marissa Lane is a 24 y.o. female G1P1 who presents for annual exam. Patient's last menstrual period was 05/28/2015. The patient has no complaints today. She has stopped birth control and uses the calendar method. Pt is currently trying to get pregnant next year. She has had normal and regular menses. 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   The following portions of the patient's history were reviewed and updated as appropriate: allergies, current medications, past family history, past medical history, past social history, past surgical history and problem list. Past Medical History  Diagnosis Date  . Chest pain   . Palpitations   . Tobacco abuse   . GERD (gastroesophageal reflux disease)   . Obesity   . Acne   . IFG (impaired fasting glucose)   . Hyperinsulinemia   . PCOS (polycystic ovarian syndrome)   . Dysthymic disorder     over dose age 57  . Postpartum depression   . H/O urinary frequency 06/25/2013  . Irregular intermenstrual bleeding 10/12/2013   Past Surgical History  Procedure Laterality Date  . Tonsillectomy    . Skin graft  2009    gum graft  . Cholecystectomy  03/06/2012    Procedure: LAPAROSCOPIC CHOLECYSTECTOMY;  Surgeon: Donato Heinz, MD;  Location: [facility];  Service: General;  Laterality: N/A;  . Esophagogastroduodenoscopy N/A 09/09/2012    Procedure: ESOPHAGOGASTRODUODENOSCOPY (EGD);  Surgeon: Rogene Houston, MD;  Location: [facility];  Service: Endoscopy;  Laterality: N/A;  340  . Mouth surgery      Current outpatient prescriptions:  .  ibuprofen (ADVIL,MOTRIN) 200 MG tablet, Take 400 mg by mouth every 6 (six) hours as needed. For pain, Disp: , Rfl:  .  naproxen  (NAPROSYN) 500 MG tablet, One po BID prn migraines, Disp: 30 tablet, Rfl: 0 .  rizatriptan (MAXALT-MLT) 10 MG disintegrating tablet, One at onset of migraine; May repeat in 2 hours if needed; max 2 per 24 hours, Disp: 10 tablet, Rfl: 0  Review of Systems Constitutional: negative Gastrointestinal: negative Genitourinary: negative  Objective:  BP 130/82 mmHg  Ht 5\' 4"  (1.626 m)  LMP 05/28/2015   BMI: There is no weight on file to calculate BMI.  General Appearance: Alert, appropriate appearance for age. No acute distress HEENT: Grossly normal Neck / Thyroid:  Cardiovascular: RRR; normal S1, S2, no murmur Lungs: CTA bilaterally Back: No CVAT Breast Exam: No dimpling, nipple retraction or discharge. No masses or nodes., Normal to inspection and No masses or nodes.No dimpling, nipple retraction or discharge. Gastrointestinal: Soft, non-tender, no masses or organomegaly Pelvic Exam: Vulva and vagina appear normal. Bimanual exam reveals normal uterus and adnexa. External genitalia: normal general appearance Vaginal: normal mucosa without prolapse or lesions and normal without tenderness, induration or masses Cervix: normal appearance Adnexa: normal bimanual exam Uterus: normal single, nontender Rectovaginal: not indicated Lymphatic Exam: Non-palpable nodes in neck, clavicular, axillary, or inguinal regions  Skin: no rash or abnormalities Neurologic: Normal gait and speech, no tremor  Psychiatric: Alert and oriented, appropriate affect.  Urinalysis:Not done  Mallory Shirk. MD Pgr [phone] 3:15 PM     By signing my name below, I, Marissa Chapel  Lane, attest that this documentation has been prepared under the direction and in the presence of Jonnie Kind, MD. Electronically Signed: Tula Nakayama, ED Scribe. 06/05/2015. 3:15 PM.  I personally performed the services described in this documentation, which was SCRIBED in my presence. The recorded information has been reviewed and  considered accurate. It has been edited as necessary during review. Jonnie Kind, MD

## 2015-06-05 NOTE — Patient Instructions (Signed)
Please go to [email or web address].

## 2015-06-05 NOTE — Progress Notes (Signed)
Patient ID: Marissa Lane, female   DOB: 12/17/1990, 24 y.o.   MRN: [id number] Pt here today for annual exam. Pt states that she would like to discuss a pre pregnancy plan but denies any problems or concerns.

## 2015-06-09 ENCOUNTER — Encounter: Payer: Self-pay | Admitting: Family Medicine

## 2015-06-09 ENCOUNTER — Ambulatory Visit (INDEPENDENT_AMBULATORY_CARE_PROVIDER_SITE_OTHER): Payer: BLUE CROSS/BLUE SHIELD | Admitting: Family Medicine

## 2015-06-09 VITALS — BP 128/82 | HR 113 | Ht 64.0 in | Wt 206.8 lb

## 2015-06-09 DIAGNOSIS — R030 Elevated blood-pressure reading, without diagnosis of hypertension: Secondary | ICD-10-CM

## 2015-06-09 DIAGNOSIS — R000 Tachycardia, unspecified: Secondary | ICD-10-CM | POA: Diagnosis not present

## 2015-06-09 DIAGNOSIS — IMO0001 Reserved for inherently not codable concepts without codable children: Secondary | ICD-10-CM

## 2015-06-09 DIAGNOSIS — G44039 Episodic paroxysmal hemicrania, not intractable: Secondary | ICD-10-CM

## 2015-06-09 MED ORDER — VARENICLINE TARTRATE 1 MG PO TABS: 1.0000 mg | ORAL_TABLET | Freq: Two times a day (BID) | ORAL | Status: DC

## 2015-06-09 MED ORDER — VARENICLINE TARTRATE 0.5 MG X 11 & 1 MG X 42 PO MISC: ORAL | Status: DC

## 2015-06-09 NOTE — Progress Notes (Signed)
   Subjective:    Patient ID: Marissa Lane, female    DOB: 30-Jul-1990, 24 y.o.   MRN: [id number]  HPI Patient arrives with c/o elevated blood pressure and heart rate. 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 For the past week. No obvious sore throat no fever or chills but somewhat achy. Some question difficulty breathing  BP elevated a ttimes, elevated somewhat when checked elsewhere. Has not been on blood pressure medication past. Though there is very strong family history.  Bro has high b p at age [age]     Patient states she has felt light headed off and on all week. No true passing out is felt week.  Also noted heart rate was on the elevated side.  Consistent regular stress, no more than usual, student parent and working  Review of Systems No current headache no chest pain no back pain no abdominal pain ROS otherwise negative    Objective:   Physical Exam Alert vitals stable. HEENT normal neck supple heart rate initially quite elevated at 120 no chest wall tenderness extremities normal  EKG 103 heart rate nonspecific ST-T changes   impression nonspecific symptomatology including headache fatigue increase heart rate diminished energy. With normal EKG blood pressure near-normal recommend no medications in that regard for now. Patient requests Chantix will prescribe 25 minutes spent most in discussion of patient's variable symptoms WSL       Assessment & Plan:

## 2015-06-12 ENCOUNTER — Ambulatory Visit (INDEPENDENT_AMBULATORY_CARE_PROVIDER_SITE_OTHER): Payer: BLUE CROSS/BLUE SHIELD | Admitting: Internal Medicine

## 2015-06-13 ENCOUNTER — Encounter (INDEPENDENT_AMBULATORY_CARE_PROVIDER_SITE_OTHER): Payer: Self-pay | Admitting: *Deleted

## 2015-07-03 ENCOUNTER — Telehealth: Payer: Self-pay | Admitting: Family Medicine

## 2015-07-03 NOTE — Telephone Encounter (Signed)
Pt taking chantix couple of weeks ago (on day 20) She is having a menstrual cycle that is now 10 days late She has been sexually active during this but she has  Taken home test that have come back negative. 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  She is wanting to know if the med would have something to do with it or does she need to come in to be seen?  She does not feel pregnant, does feel the symptoms of the cycle  But no cycle as of yet.   Please advise

## 2015-07-03 NOTE — Telephone Encounter (Signed)
Called patient and informed her per Dr.Scott Luking-If cycle is more than 2 weeks late recommend office visit.  Dr.Scott doubts that chantix has anything to do with situation. Could be stress related. Patient verbalized understanding.

## 2015-07-03 NOTE — Telephone Encounter (Signed)
Nurse's-please discuss with patient. If the patient is more then 2 weeks late for her menstruation I would recommend office visit.( with Hoyle Sauer if at all possible) I doubt that Chantix has anything to do with her situation. Could be stress related.

## 2015-07-19 ENCOUNTER — Encounter: Payer: Self-pay | Admitting: Nurse Practitioner

## 2015-07-19 ENCOUNTER — Ambulatory Visit (INDEPENDENT_AMBULATORY_CARE_PROVIDER_SITE_OTHER): Payer: BLUE CROSS/BLUE SHIELD | Admitting: Nurse Practitioner

## 2015-07-19 ENCOUNTER — Encounter: Payer: Self-pay | Admitting: Family Medicine

## 2015-07-19 VITALS — BP 118/76 | Temp 99.0°F | Wt 203.4 lb

## 2015-07-19 DIAGNOSIS — N912 Amenorrhea, unspecified: Secondary | ICD-10-CM | POA: Diagnosis not present

## 2015-07-19 DIAGNOSIS — H66001 Acute suppurative otitis media without spontaneous rupture of ear drum, right ear: Secondary | ICD-10-CM

## 2015-07-19 DIAGNOSIS — R300 Dysuria: Secondary | ICD-10-CM | POA: Diagnosis not present

## 2015-07-19 DIAGNOSIS — A084 Viral intestinal infection, unspecified: Secondary | ICD-10-CM

## 2015-07-19 DIAGNOSIS — B349 Viral infection, unspecified: Secondary | ICD-10-CM | POA: Diagnosis not present

## 2015-07-19 LAB — POCT URINALYSIS DIPSTICK
Blood, UA: NEGATIVE
PH UA: 5
Spec Grav, UA: 1.015

## 2015-07-19 LAB — POCT UA - MICROSCOPIC ONLY
BACTERIA, U MICROSCOPIC: NEGATIVE
RBC, URINE, MICROSCOPIC: NEGATIVE
WBC, UR, HPF, POC: NEGATIVE

## 2015-07-19 LAB — POCT URINE PREGNANCY: Preg Test, Ur: NEGATIVE

## 2015-07-19 MED ORDER — AMOXICILLIN 500 MG PO CAPS: 500.0000 mg | ORAL_CAPSULE | Freq: Three times a day (TID) | ORAL | Status: DC

## 2015-07-19 NOTE — Progress Notes (Signed)
Subjective:  Presents for c/o headache, cough, fever that began 12/23. Right ear pain. Frequent cough. No wheezing. Also developed vomiting and diarrhea yesterday after being exposed to someone with intestinal virus. Vomiting resolved. Still has slight diarrhea. Had some burning with urination yesterday which patient relates to mild dehydration. Has resolved now that she is drinking fluids. Also late for her normal menses; has had some light spotting last week. No birth control. No planned pregnancy but fine if it happens.   Objective:   BP 118/76 mmHg  Temp(Src) 99 F (37.2 C) (Oral)  Wt 203 lb 6 oz (92.25 kg) NAD. Alert, oriented. Lt TM: clear effusion. Rt TM: retracted with 2/3 very erythematous. Pharynx moderate erythema with cloudy PND. Neck supple with mild anterior adenopathy. Lungs clear. Heart RRR. Abdomen soft, non tender.  Results for orders placed or performed in visit on 07/19/15  POCT urinalysis dipstick  Result Value Ref Range   Color, UA Dark Yellow    Clarity, UA Clear    Glucose, UA     Bilirubin, UA     Ketones, UA     Spec Grav, UA 1.015    Blood, UA Negative    pH, UA 5.0    Protein, UA     Urobilinogen, UA     Nitrite, UA     Leukocytes, UA small (1+) (A) Negative  POCT urine pregnancy  Result Value Ref Range   Preg Test, Ur Negative Negative  POCT UA - Microscopic Only  Result Value Ref Range   WBC, Ur, HPF, POC neg    RBC, urine, microscopic neg    Bacteria, U Microscopic neg    Mucus, UA     Epithelial cells, urine per micros occas    Crystals, Ur, HPF, POC     Casts, Ur, LPF, POC     Yeast, UA       Assessment: Viral illness  Acute suppurative otitis media of right ear without spontaneous rupture of tympanic membrane, recurrence not specified  Viral gastroenteritis  Dysuria - Plan: POCT urinalysis dipstick, POCT UA - Microscopic Only  Amenorrhea - Plan: POCT urine pregnancy  Plan:  Meds ordered this encounter  Medications  . amoxicillin  (AMOXIL) 500 MG capsule    Sig: Take 1 capsule (500 mg total) by mouth 3 (three) times daily.    Dispense:  30 capsule    Refill:  0    Order Specific Question:  Supervising Provider    Answer:  Mikey Kirschner [[id number]]   Increase clear fluid intake. OTC meds as directed for congestion and cough. Viral portion of illness may take time to resolve. Call back if worsens or persists.

## 2015-07-21 ENCOUNTER — Telehealth: Payer: Self-pay | Admitting: Family Medicine

## 2015-07-21 ENCOUNTER — Encounter: Payer: Self-pay | Admitting: Family Medicine

## 2015-07-21 NOTE — Telephone Encounter (Signed)
Pt aware letter is ready for pick up

## 2015-07-21 NOTE — Telephone Encounter (Signed)
Pt called wanting to know if there is anything that she can take to relieve the pressure on her ear. Pt was prescribed amoxicillin for an ear infection. Please advise.   [employer]

## 2015-07-21 NOTE — Telephone Encounter (Signed)
Notified patient per Dr. Nicki Reaper to use Afrin nasal spray 1 spray BID for the next 3-4 days. Please give patient a work excuse for today.

## 2015-09-05 ENCOUNTER — Ambulatory Visit (INDEPENDENT_AMBULATORY_CARE_PROVIDER_SITE_OTHER): Payer: BLUE CROSS/BLUE SHIELD | Admitting: Family Medicine

## 2015-09-05 ENCOUNTER — Ambulatory Visit (HOSPITAL_COMMUNITY)
Admission: RE | Admit: 2015-09-05 | Discharge: 2015-09-05 | Disposition: A | Discharge status: Home / Self Care | Payer: BLUE CROSS/BLUE SHIELD | Source: Ambulatory Visit | Attending: Family Medicine | Admitting: Family Medicine

## 2015-09-05 ENCOUNTER — Encounter: Payer: Self-pay | Admitting: Family Medicine

## 2015-09-05 VITALS — BP 132/86 | Ht 64.0 in | Wt 205.0 lb

## 2015-09-05 DIAGNOSIS — M79674 Pain in right toe(s): Secondary | ICD-10-CM | POA: Diagnosis not present

## 2015-09-05 MED ORDER — DOXYCYCLINE HYCLATE 100 MG PO TABS: ORAL_TABLET | ORAL | Status: DC

## 2015-09-05 NOTE — Progress Notes (Signed)
   Subjective:    Patient ID: Marissa Lane, female    DOB: 1991-02-17, 25 y.o.   MRN: [id number]  HPIhit right little toe on a log 4 days ago. Pain and swelling. Elevation as much as possible.   First 2 days pain was not much but then the toe began to swell and become more painful. Ex  Patient notes a split in the skin below the toe also  Review of Systems No ankle pain no fever or chills    Objective:   Physical Exam  Alert vital stable lungs clear heart rhythm small toe right foot impressive split and skin at base of toe due to hyperextension injury diffuse tenderness some mild swelling slight discharge      Assessment & Plan:  Impression skin wounds secondary infection #2 possible toe fracture plan x-ray toe, [place] twice a day 7 days wound management discussed symptoms care discussed WSL

## 2016-02-06 ENCOUNTER — Encounter: Payer: Self-pay | Admitting: Women's Health

## 2016-02-06 ENCOUNTER — Ambulatory Visit (INDEPENDENT_AMBULATORY_CARE_PROVIDER_SITE_OTHER): Payer: Medicaid Other | Admitting: Women's Health

## 2016-02-06 VITALS — BP 110/70 | Ht 64.0 in | Wt 214.0 lb

## 2016-02-06 DIAGNOSIS — Z3201 Encounter for pregnancy test, result positive: Secondary | ICD-10-CM

## 2016-02-06 DIAGNOSIS — R109 Unspecified abdominal pain: Secondary | ICD-10-CM | POA: Diagnosis not present

## 2016-02-06 DIAGNOSIS — O26899 Other specified pregnancy related conditions, unspecified trimester: Secondary | ICD-10-CM

## 2016-02-06 DIAGNOSIS — O3680X0 Pregnancy with inconclusive fetal viability, not applicable or unspecified: Secondary | ICD-10-CM

## 2016-02-06 DIAGNOSIS — R252 Cramp and spasm: Secondary | ICD-10-CM | POA: Diagnosis not present

## 2016-02-06 DIAGNOSIS — Z3490 Encounter for supervision of normal pregnancy, unspecified, unspecified trimester: Secondary | ICD-10-CM

## 2016-02-06 LAB — POCT URINE PREGNANCY: Preg Test, Ur: POSITIVE — AB

## 2016-02-06 NOTE — Progress Notes (Signed)
Patient ID: Marissa Lane, female   DOB: 1991/05/13, 24 y.o.   MRN: [id number]   [facility] Visit  Patient name: Marissa Lane MRN [id number]  Date of birth: 1991-07-02  CC & HPI:  Marissa Lane is a 25 y.o. G20P1001 Caucasian female presenting today for +HPT. Patient's last menstrual period was 01/07/2016. Has been trying to conceive since Dec. Just started pnv. Some cramping, did have low progesterone last pregnancy and had to be on suppositories. No bleeding. No n/v. H/O migraines, has rx for maxalt prn-advised not to take during pregnancy.  Patient's last menstrual period was 01/07/2016.  Pertinent History Reviewed:  Medical & Surgical Hx:   Past medical, surgical, family, and social history reviewed in electronic medical record Medications: Reviewed & Updated - see associated section Allergies: Reviewed in electronic medical record  Objective Findings:  Vitals: BP 110/70 mmHg  Ht 5\' 4"  (1.626 m)  Wt 214 lb (97.07 kg)  BMI 36.72 kg/m2  LMP 01/07/2016 Body mass index is 36.72 kg/(m^2).  Physical Examination: General appearance - alert, well appearing, and in no distress  No results found for this or any previous visit (from the past 24 hour(s)).   Assessment & Plan:  A:   [redacted]w[redacted]d by LMP w/ EDC of 10/14/15  Cramping  H/O low progesterone  P:  Will check progesterone level, if low will rx progesterone suppositories  Return in about 2 weeks (around 02/20/2016) for dating u/s, then 1wk later for new ob.  Tawnya Crook CNM, [facility] 02/06/2016 12:21 PM

## 2016-02-06 NOTE — Patient Instructions (Signed)

## 2016-02-07 ENCOUNTER — Telehealth: Payer: Self-pay | Admitting: *Deleted

## 2016-02-07 ENCOUNTER — Other Ambulatory Visit: Payer: Self-pay | Admitting: Women's Health

## 2016-02-07 ENCOUNTER — Telehealth: Payer: Self-pay | Admitting: Women's Health

## 2016-02-07 DIAGNOSIS — R7989 Other specified abnormal findings of blood chemistry: Secondary | ICD-10-CM

## 2016-02-07 LAB — PROGESTERONE: Progesterone: 10.4 ng/mL

## 2016-02-07 MED ORDER — PROGESTERONE MICRONIZED 200 MG PO CAPS: 200.0000 mg | ORAL_CAPSULE | Freq: Every day | ORAL | Status: DC

## 2016-02-07 NOTE — Telephone Encounter (Signed)
Pt informed per [name], CNM Progesterone level 10.4, Rx for Prometrium e-scribed to [facility] - suppository inserted in vagina every night before she goes to bed until 14 weeks of pregnancy. Pt verbalized understanding.

## 2016-02-07 NOTE — Telephone Encounter (Signed)
Pt informed safe to get TB skin test while pregnant. 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 Pt concerned due to low progesterone level and lifting.

## 2016-02-08 ENCOUNTER — Emergency Department (HOSPITAL_COMMUNITY): Payer: Medicaid Other

## 2016-02-08 ENCOUNTER — Emergency Department (HOSPITAL_COMMUNITY)
Admission: EM | Admit: 2016-02-08 | Discharge: 2016-02-08 | Disposition: A | Discharge status: Home / Self Care | Payer: Medicaid Other | Attending: Emergency Medicine | Admitting: Emergency Medicine

## 2016-02-08 ENCOUNTER — Telehealth: Payer: Self-pay | Admitting: Women's Health

## 2016-02-08 ENCOUNTER — Encounter (HOSPITAL_COMMUNITY): Payer: Self-pay | Admitting: Emergency Medicine

## 2016-02-08 DIAGNOSIS — O99331 Smoking (tobacco) complicating pregnancy, first trimester: Secondary | ICD-10-CM | POA: Diagnosis not present

## 2016-02-08 DIAGNOSIS — Z79899 Other long term (current) drug therapy: Secondary | ICD-10-CM | POA: Insufficient documentation

## 2016-02-08 DIAGNOSIS — Z6836 Body mass index (BMI) 36.0-36.9, adult: Secondary | ICD-10-CM | POA: Insufficient documentation

## 2016-02-08 DIAGNOSIS — R102 Pelvic and perineal pain: Secondary | ICD-10-CM | POA: Insufficient documentation

## 2016-02-08 DIAGNOSIS — R109 Unspecified abdominal pain: Secondary | ICD-10-CM | POA: Insufficient documentation

## 2016-02-08 DIAGNOSIS — O26891 Other specified pregnancy related conditions, first trimester: Secondary | ICD-10-CM | POA: Insufficient documentation

## 2016-02-08 DIAGNOSIS — F17210 Nicotine dependence, cigarettes, uncomplicated: Secondary | ICD-10-CM | POA: Insufficient documentation

## 2016-02-08 DIAGNOSIS — E669 Obesity, unspecified: Secondary | ICD-10-CM | POA: Insufficient documentation

## 2016-02-08 DIAGNOSIS — Z3490 Encounter for supervision of normal pregnancy, unspecified, unspecified trimester: Secondary | ICD-10-CM

## 2016-02-08 LAB — URINALYSIS, ROUTINE W REFLEX MICROSCOPIC
Bilirubin Urine: NEGATIVE
GLUCOSE, UA: NEGATIVE mg/dL
Hgb urine dipstick: NEGATIVE
Ketones, ur: NEGATIVE mg/dL
LEUKOCYTES UA: NEGATIVE
Nitrite: NEGATIVE
PH: 6.5 (ref 5.0–8.0)
Protein, ur: NEGATIVE mg/dL
Specific Gravity, Urine: 1.025 (ref 1.005–1.030)

## 2016-02-08 LAB — CBC
HCT: 39.4 % (ref 36.0–46.0)
Hemoglobin: 13.9 g/dL (ref 12.0–15.0)
MCH: 31.4 pg (ref 26.0–34.0)
MCHC: 35.3 g/dL (ref 30.0–36.0)
MCV: 88.9 fL (ref 78.0–100.0)
Platelets: 310 10*3/uL (ref 150–400)
RBC: 4.43 MIL/uL (ref 3.87–5.11)
RDW: 12.3 % (ref 11.5–15.5)
WBC: 11.5 10*3/uL — ABNORMAL HIGH (ref 4.0–10.5)

## 2016-02-08 LAB — ABO/RH: ABO/RH(D): A POS

## 2016-02-08 LAB — HCG, QUANTITATIVE, PREGNANCY: HCG, BETA CHAIN, QUANT, S: 920 m[IU]/mL — AB (ref <5)

## 2016-02-08 MED ORDER — MAGNESIUM HYDROXIDE 400 MG/5ML PO SUSP
30.0000 mL | Freq: Once | ORAL | Status: AC
  Administered 2016-02-08: 30 mL via ORAL
  Filled 2016-02-08: qty 30

## 2016-02-08 NOTE — ED Notes (Signed)
Patient walked to the bathroom with minimal assistance.  

## 2016-02-08 NOTE — Discharge Instructions (Signed)
HCG level was 920. Urinalysis showed no evidence of infection. Ultrasound was unable to see any fetal development, but this is not unusual. Return for vaginal bleeding, increased pain, passing out, any concerns.

## 2016-02-08 NOTE — ED Notes (Signed)
Pt reports she is [redacted] weeks pregnant and started having menstraul like cramps on Fri. Pt reports lower mid abd cramping and pressure. Denies bleeding.

## 2016-02-08 NOTE — ED Notes (Signed)
Aware of pending dispo. Pt alert, sitting in bed testing on cell

## 2016-02-08 NOTE — ED Notes (Signed)
Pt called for room. No answer 

## 2016-02-08 NOTE — ED Provider Notes (Signed)
CSN: [id number]     Arrival date & time 02/08/16  1611 History   First MD Initiated Contact with Patient 02/08/16 1819     Chief Complaint  Patient presents with  . Abdominal Cramping     (Consider location/radiation/quality/duration/timing/severity/associated sxs/prior Treatment) HPI......G2 P1 last menstrual period 01/07/16 presents with intermittent abdominal cramping for several days.  No vaginal bleeding, vaginal discharge, dysuria, fever, sweats, chills. She has had low progesterone in the past and is presently taking a vaginal suppository. Severity of discomfort is mild. Nothing makes symptoms better or worse.  Past Medical History  Diagnosis Date  . Chest pain   . Palpitations   . Tobacco abuse   . GERD (gastroesophageal reflux disease)   . Obesity   . Acne   . IFG (impaired fasting glucose)   . Hyperinsulinemia   . PCOS (polycystic ovarian syndrome)   . Dysthymic disorder     over dose age 37  . Postpartum depression   . H/O urinary frequency 06/25/2013  . Irregular intermenstrual bleeding 10/12/2013   Past Surgical History  Procedure Laterality Date  . Tonsillectomy    . Skin graft  2009    gum graft  . Cholecystectomy  03/06/2012    Procedure: LAPAROSCOPIC CHOLECYSTECTOMY;  Surgeon: Donato Heinz, MD;  Location: [facility]S;  Service: General;  Laterality: N/A;  . Esophagogastroduodenoscopy N/A 09/09/2012    Procedure: ESOPHAGOGASTRODUODENOSCOPY (EGD);  Surgeon: Rogene Houston, MD;  Location: [facility] ENDO SUITE;  Service: Endoscopy;  Laterality: N/A;  340  . Mouth surgery     Family History  Problem Relation Age of Onset  . Adopted: Yes  . Congestive Heart Failure Mother   . Irritable bowel syndrome Mother   . Ulcers Mother   . Anemia Mother   . Hypertension Brother   . Colon cancer Maternal Grandmother 54  . Heart disease Maternal Grandfather 42    MI   Social History  Substance Use Topics  . Smoking status: Current Every Day Smoker -- 1.00 packs/day for 5 years     Types: Cigarettes  . Smokeless tobacco: Never Used  . Alcohol Use: No   OB History    Gravida Para Term Preterm AB TAB SAB Ectopic Multiple Living   1 1 1       1      Review of Systems  All other systems reviewed and are negative.     Allergies  Cymbalta; Vicodin; and Zofran  Home Medications   Prior to Admission medications   Medication Sig Start Date End Date Taking? Authorizing Provider  acetaminophen (TYLENOL) 500 MG tablet Take 500 mg by mouth every 6 (six) hours as needed.   Yes Historical Provider, MD  Prenatal Vit-Fe Fumarate-FA (PRENATAL VITAMIN PO) Take 1 tablet by mouth daily.   Yes Historical Provider, MD  progesterone (PROMETRIUM) 200 MG capsule Place 1 capsule (200 mg total) vaginally at bedtime. 02/07/16  Yes Royetta Crochet Booker, CNM   BP 148/91 mmHg  Pulse 98  Temp(Src) 98.2 F (36.8 C) (Oral)  Resp 20  Ht 5\' 4"  (1.626 m)  Wt 214 lb 4 oz (97.183 kg)  BMI 36.76 kg/m2  SpO2 99%  LMP 01/07/2016 Physical Exam  Constitutional: She is oriented to person, place, and time. She appears well-developed and well-nourished.  HENT:  Head: Normocephalic and atraumatic.  Eyes: Conjunctivae are normal.  Neck: Neck supple.  Cardiovascular: Normal rate and regular rhythm.   Pulmonary/Chest: Effort normal and breath sounds normal.  Abdominal: Soft. Bowel  sounds are normal.  Nontender in suprapubic area.  Musculoskeletal: Normal range of motion.  Neurological: She is alert and oriented to person, place, and time.  Skin: Skin is warm and dry.  Psychiatric: She has a normal mood and affect. Her behavior is normal.  Nursing note and vitals reviewed.   ED Course  Procedures (including critical care time) Labs Review Labs Reviewed  CBC - Abnormal; Notable for the following:    WBC 11.5 (*)    All other components within normal limits  HCG, QUANTITATIVE, PREGNANCY - Abnormal; Notable for the following:    hCG, Beta Chain, Quant, S 920 (*)    All other components  within normal limits  URINE CULTURE  URINALYSIS, ROUTINE W REFLEX MICROSCOPIC (NOT AT [facility])  ABO/RH    Imaging Review US Ob Limited  02/08/2016  CLINICAL DATA:  Pelvic pain and cramping for 1 day. Gestational age by LMP of 4 weeks 4 days. EXAM: OBSTETRIC <14 WK [place] AND TRANSVAGINAL OB US TECHNIQUE: Both transabdominal and transvaginal ultrasound examinations were performed for complete evaluation of the gestation as well as the maternal uterus, adnexal regions, and pelvic cul-de-sac. Transvaginal technique was performed to assess early pregnancy. COMPARISON:  None. FINDINGS: No intrauterine gestational sac or other fluid collection visualized in endometrial cavity. Endometrial thickness measures 8 mm. No fibroids identified. Right ovary is normal in appearance and contains several follicles. Left ovary not directly visualized, however no adnexal mass or free fluid identified. IMPRESSION: Pregnancy location not visualized sonographically. Differential diagnosis includes recent spontaneous abortion, IUP too early to visualize, and non-visualized ectopic pregnancy. Recommend close follow up of quantitative B-HCG levels, and follow up US as clinically warranted. Electronically Signed   By: Earle Gell M.D.   On: 02/08/2016 18:37   US Ob Transvaginal  02/08/2016  CLINICAL DATA:  Pelvic pain and cramping for 1 day. Gestational age by LMP of 4 weeks 4 days. EXAM: OBSTETRIC <14 WK [place] AND TRANSVAGINAL OB US TECHNIQUE: Both transabdominal and transvaginal ultrasound examinations were performed for complete evaluation of the gestation as well as the maternal uterus, adnexal regions, and pelvic cul-de-sac. Transvaginal technique was performed to assess early pregnancy. COMPARISON:  None. FINDINGS: No intrauterine gestational sac or other fluid collection visualized in endometrial cavity. Endometrial thickness measures 8 mm. No fibroids identified. Right ovary is normal in appearance and contains several follicles.  Left ovary not directly visualized, however no adnexal mass or free fluid identified. IMPRESSION: Pregnancy location not visualized sonographically. Differential diagnosis includes recent spontaneous abortion, IUP too early to visualize, and non-visualized ectopic pregnancy. Recommend close follow up of quantitative B-HCG levels, and follow up US as clinically warranted. Electronically Signed   By: Earle Gell M.D.   On: 02/08/2016 18:37   I have personally reviewed and evaluated these images and lab results as part of my medical decision-making.   EKG Interpretation None      MDM   Final diagnoses:  Pregnancy    Vital signs are stable. Patient is in no acute distress. Ultrasound shows no unusual findings. HCG level 920. Discussed with the patient. She will follow-up with her obstetrician for further evaluation.   Nat Christen, MD 02/08/16 2026

## 2016-02-08 NOTE — ED Notes (Signed)
Received report on pt, pt updated on plan of care, c/o indigestion, requesting tums, Dr Lacinda Axon notified,

## 2016-02-10 LAB — URINE CULTURE

## 2016-02-12 NOTE — Telephone Encounter (Signed)
Na x 3 attempts.

## 2016-02-12 NOTE — Telephone Encounter (Signed)
Pt informed per [name], CNM lifting will not affect Progesterone level. Pt also informed can give a note no lifting/pushing/pulling > 25lbs. Pt states she does not feel she needs a note at this time spoke with coworkers and they are willing to work with her in regards to lifting pt.

## 2016-02-20 ENCOUNTER — Ambulatory Visit (INDEPENDENT_AMBULATORY_CARE_PROVIDER_SITE_OTHER): Payer: Medicaid Other

## 2016-02-20 DIAGNOSIS — Z3A01 Less than 8 weeks gestation of pregnancy: Secondary | ICD-10-CM | POA: Diagnosis not present

## 2016-02-20 DIAGNOSIS — O3680X0 Pregnancy with inconclusive fetal viability, not applicable or unspecified: Secondary | ICD-10-CM | POA: Diagnosis not present

## 2016-02-20 NOTE — Progress Notes (Signed)
[place] 6+2 wks,single IUP w/ys,pos fht 126 bpm,crl 5.0 mm,normal lt ov,simple corpus luteal cyst 2.6 x 2.9 x 2.6 cm

## 2016-02-29 ENCOUNTER — Telehealth: Payer: Self-pay | Admitting: Adult Health

## 2016-02-29 NOTE — Telephone Encounter (Signed)
Pt c/o gas pains early pregancy 7w 4d. Pt advised per Derrek Monaco, NP increase water 6-8 glasses a day, juices. If no improvement call our office back. Pt verbalized understanding.

## 2016-03-05 ENCOUNTER — Encounter: Payer: Self-pay | Admitting: Women's Health

## 2016-03-05 ENCOUNTER — Ambulatory Visit (INDEPENDENT_AMBULATORY_CARE_PROVIDER_SITE_OTHER): Payer: Medicaid Other | Admitting: Women's Health

## 2016-03-05 VITALS — BP 142/80 | HR 80 | Wt 217.0 lb

## 2016-03-05 DIAGNOSIS — O99211 Obesity complicating pregnancy, first trimester: Secondary | ICD-10-CM | POA: Diagnosis not present

## 2016-03-05 DIAGNOSIS — Z331 Pregnant state, incidental: Secondary | ICD-10-CM | POA: Diagnosis not present

## 2016-03-05 DIAGNOSIS — Z1389 Encounter for screening for other disorder: Secondary | ICD-10-CM | POA: Diagnosis not present

## 2016-03-05 DIAGNOSIS — Z0283 Encounter for blood-alcohol and blood-drug test: Secondary | ICD-10-CM

## 2016-03-05 DIAGNOSIS — Z87891 Personal history of nicotine dependence: Secondary | ICD-10-CM | POA: Insufficient documentation

## 2016-03-05 DIAGNOSIS — Z3A09 9 weeks gestation of pregnancy: Secondary | ICD-10-CM | POA: Diagnosis not present

## 2016-03-05 DIAGNOSIS — F17200 Nicotine dependence, unspecified, uncomplicated: Secondary | ICD-10-CM | POA: Insufficient documentation

## 2016-03-05 DIAGNOSIS — Z3491 Encounter for supervision of normal pregnancy, unspecified, first trimester: Secondary | ICD-10-CM

## 2016-03-05 DIAGNOSIS — O99331 Smoking (tobacco) complicating pregnancy, first trimester: Secondary | ICD-10-CM | POA: Diagnosis not present

## 2016-03-05 DIAGNOSIS — Z369 Encounter for antenatal screening, unspecified: Secondary | ICD-10-CM

## 2016-03-05 DIAGNOSIS — Z3682 Encounter for antenatal screening for nuchal translucency: Secondary | ICD-10-CM

## 2016-03-05 DIAGNOSIS — O0990 Supervision of high risk pregnancy, unspecified, unspecified trimester: Secondary | ICD-10-CM | POA: Insufficient documentation

## 2016-03-05 LAB — POCT URINALYSIS DIPSTICK
GLUCOSE UA: NEGATIVE
Ketones, UA: NEGATIVE
LEUKOCYTES UA: NEGATIVE
NITRITE UA: NEGATIVE
Protein, UA: NEGATIVE
RBC UA: NEGATIVE

## 2016-03-05 NOTE — Progress Notes (Signed)
Subjective:  Marissa Lane is a 25 y.o. G39P1001 Caucasian female at [redacted]w[redacted]d by LMP c/w 6wk u/s being seen today for her first obstetrical visit.  Her obstetrical history is significant for term uncomplicated svb x 1, low progesterone this pregnancy and last- on prometrium suppositories now.  Smoker: 1ppd prior to pregnancy, now down to ~6cigs/day. Pregnancy history fully reviewed.  Patient reports some cramping- same as before, no bleeding. Denies h/o HTN, BP last visit completely normal. States she was running around w/ child right before she got here.  Denies vb, cramping, uti s/s, abnormal/malodorous vag d/c, or vulvovaginal itching/irritation.  BP (!) 142/80   Pulse 80   Wt 217 lb (98.4 kg)   LMP 01/07/2016 (Exact Date)   BMI 37.25 kg/m   HISTORY: OB History  Gravida Para Term Preterm AB Living  2 1 1     1   SAB TAB Ectopic Multiple Live Births          1    # Outcome Date GA Lbr Len/2nd Weight Sex Delivery Anes PTL Lv  2 Current           1 Term 09/20/09 [redacted]w[redacted]d  6 lb 8 oz (2.948 kg) F Vag-Spont EPI N LIV     Past Medical History:  Diagnosis Date  . Acne   . Chest pain   . Dysthymic disorder    over dose age 72  . GERD (gastroesophageal reflux disease)   . H/O urinary frequency 06/25/2013  . Hyperinsulinemia   . IFG (impaired fasting glucose)   . Irregular intermenstrual bleeding 10/12/2013  . Obesity   . Palpitations   . PCOS (polycystic ovarian syndrome)   . Postpartum depression   . Tobacco abuse    Past Surgical History:  Procedure Laterality Date  . CHOLECYSTECTOMY  03/06/2012   Procedure: LAPAROSCOPIC CHOLECYSTECTOMY;  Surgeon: Donato Heinz, MD;  Location: [facility]S;  Service: General;  Laterality: N/A;  . ESOPHAGOGASTRODUODENOSCOPY N/A 09/09/2012   Procedure: ESOPHAGOGASTRODUODENOSCOPY (EGD);  Surgeon: Rogene Houston, MD;  Location: [facility];  Service: Endoscopy;  Laterality: N/A;  340  . MOUTH SURGERY    . SKIN GRAFT  2009   gum graft  . TONSILLECTOMY      Family History  Problem Relation Age of Onset  . Adopted: Yes  . Congestive Heart Failure Mother   . Irritable bowel syndrome Mother   . Ulcers Mother   . Anemia Mother   . Hypertension Brother   . Colon cancer Maternal Grandmother 84  . Heart disease Maternal Grandfather 57    MI    Exam   System:     General: Well developed & nourished, no acute distress   Skin: Warm & dry, normal coloration and turgor, no rashes   Neurologic: Alert & oriented, normal mood   Cardiovascular: Regular rate & rhythm   Respiratory: Effort & rate normal, LCTAB, acyanotic   Abdomen: Soft, non tender   Extremities: normal strength, tone  Thin prep pap smear neg 2015  FHR: 160s via informal transabdominal u/s   Assessment:   Pregnancy: G2P1001 Patient Active Problem List   Diagnosis Date Noted  . Supervision of normal pregnancy 03/05/2016    Priority: High  . Smoker 03/05/2016  . Low serum progesterone 02/07/2016  . Migraine without aura and with status migrainosus, not intractable 04/06/2015  . Amenorrhea 04/04/2014  . Back spasm 11/08/2013  . Irregular intermenstrual bleeding 10/12/2013  . Anxiety 09/21/2013  . H/O urinary frequency  06/25/2013  . Abdominal wall pain in suprapubic region 05/28/2013  . PCOS (polycystic ovarian syndrome) 12/11/2012  . Erosive esophagitis 11/05/2012  . GERD (gastroesophageal reflux disease) 09/02/2012  . Morbid obesity ([place]) 09/14/2010  . GASTROESOPHAGEAL REFLUX DISEASE 09/14/2010  . PALPITATIONS 09/14/2010  . CHEST PAIN 09/14/2010    [redacted]w[redacted]d G2P1001 New OB visit Smoker Low progesterone Obese  Plan:  Initial labs drawn Continue prenatal vitamins Problem list reviewed and updated Reviewed n/v relief measures and warning s/s to report Reviewed recommended weight gain based on pre-gravid BMI Encouraged well-balanced diet Genetic Screening discussed Integrated Screen: requested Cystic fibrosis screening discussed declined Ultrasound discussed;  fetal survey: requested Follow up in 4 weeks for 1st it/nt and visit [facility] completed Continue prometrium suppositories until 14wks Smokes 6 cigarettes/day, advised cessation, discussed risks to fetus while pregnant, to infant pp, and to herself.   Tawnya Crook CNM, [facility] 03/05/2016 4:52 PM

## 2016-03-05 NOTE — Patient Instructions (Signed)

## 2016-03-08 LAB — MICROSCOPIC EXAMINATION
Casts: NONE SEEN /lpf
Epithelial Cells (non renal): >10 /hpf — AB (ref 0–10)
RBC MICROSCOPIC, UA: NONE SEEN /HPF (ref 0–?)

## 2016-03-08 LAB — PMP SCREEN PROFILE (10S), URINE
Amphetamine Screen, Ur: NEGATIVE ng/mL
BARBITURATE SCRN UR: NEGATIVE ng/mL
BENZODIAZEPINE SCREEN, URINE: NEGATIVE ng/mL
CREATININE(CRT), U: 329.4 mg/dL — AB (ref 20.0–300.0)
Cannabinoids Ur Ql Scn: NEGATIVE ng/mL
Cocaine(Metab.)Screen, Urine: NEGATIVE ng/mL
METHADONE SCREEN, URINE: NEGATIVE ng/mL
Opiate Scrn, Ur: NEGATIVE ng/mL
Oxycodone+Oxymorphone Ur Ql Scn: NEGATIVE ng/mL
PCP Scrn, Ur: NEGATIVE ng/mL
PH UR, DRUG SCRN: 6.6 (ref 4.5–8.9)
PROPOXYPHENE SCREEN: NEGATIVE ng/mL

## 2016-03-08 LAB — HIV ANTIBODY (ROUTINE TESTING W REFLEX): HIV SCREEN 4TH GENERATION: NONREACTIVE

## 2016-03-08 LAB — CBC
HEMOGLOBIN: 14.1 g/dL (ref 11.1–15.9)
Hematocrit: 41.4 % (ref 34.0–46.6)
MCH: 30.9 pg (ref 26.6–33.0)
MCHC: 34.1 g/dL (ref 31.5–35.7)
MCV: 91 fL (ref 79–97)
PLATELETS: 326 10*3/uL (ref 150–379)
RBC: 4.56 x10E6/uL (ref 3.77–5.28)
RDW: 13.1 % (ref 12.3–15.4)
WBC: 10.4 10*3/uL (ref 3.4–10.8)

## 2016-03-08 LAB — URINALYSIS, ROUTINE W REFLEX MICROSCOPIC
BILIRUBIN UA: NEGATIVE
GLUCOSE, UA: NEGATIVE
Leukocytes, UA: NEGATIVE
NITRITE UA: NEGATIVE
RBC, UA: NEGATIVE
UUROB: 1 mg/dL (ref 0.2–1.0)
pH, UA: 6.5 (ref 5.0–7.5)

## 2016-03-08 LAB — ABO/RH: Rh Factor: POSITIVE

## 2016-03-08 LAB — RUBELLA SCREEN: RUBELLA: 3.4 {index} (ref >0.99)

## 2016-03-08 LAB — HEPATITIS B SURFACE ANTIGEN: HEP B S AG: NEGATIVE

## 2016-03-08 LAB — RPR: RPR Ser Ql: NONREACTIVE

## 2016-03-08 LAB — VARICELLA ZOSTER ANTIBODY, IGG: VARICELLA: 355 {index} (ref >165)

## 2016-03-08 LAB — ANTIBODY SCREEN: Antibody Screen: NEGATIVE

## 2016-03-09 LAB — URINE CULTURE: Organism ID, Bacteria: NO GROWTH

## 2016-03-09 LAB — GC/CHLAMYDIA PROBE AMP
Chlamydia trachomatis, NAA: NEGATIVE
NEISSERIA GONORRHOEAE BY PCR: NEGATIVE

## 2016-03-21 ENCOUNTER — Telehealth: Payer: Self-pay | Admitting: Obstetrics and Gynecology

## 2016-03-21 NOTE — Telephone Encounter (Signed)
Pt c/o cramping since Sunday night, no vaginal bleeding. Pt informed to push water, take Tylenol. If no improvement to call our office back and make an appt for evaluation.

## 2016-03-21 NOTE — Telephone Encounter (Signed)
Pt called stating that she has been experiencing some cramping, Pt states that she has been experiencing cramping this pregnancy but this week it has been painful.pt would like to speak with a nurse

## 2016-04-02 ENCOUNTER — Ambulatory Visit (INDEPENDENT_AMBULATORY_CARE_PROVIDER_SITE_OTHER): Payer: Medicaid Other

## 2016-04-02 ENCOUNTER — Ambulatory Visit (INDEPENDENT_AMBULATORY_CARE_PROVIDER_SITE_OTHER): Payer: Medicaid Other | Admitting: Advanced Practice Midwife

## 2016-04-02 VITALS — BP 132/72 | HR 104 | Wt 213.6 lb

## 2016-04-02 DIAGNOSIS — Z3682 Encounter for antenatal screening for nuchal translucency: Secondary | ICD-10-CM

## 2016-04-02 DIAGNOSIS — O99331 Smoking (tobacco) complicating pregnancy, first trimester: Secondary | ICD-10-CM | POA: Diagnosis not present

## 2016-04-02 DIAGNOSIS — Z1389 Encounter for screening for other disorder: Secondary | ICD-10-CM

## 2016-04-02 DIAGNOSIS — Z3A13 13 weeks gestation of pregnancy: Secondary | ICD-10-CM | POA: Diagnosis not present

## 2016-04-02 DIAGNOSIS — Z36 Encounter for antenatal screening of mother: Secondary | ICD-10-CM

## 2016-04-02 DIAGNOSIS — Z3491 Encounter for supervision of normal pregnancy, unspecified, first trimester: Secondary | ICD-10-CM

## 2016-04-02 DIAGNOSIS — Z331 Pregnant state, incidental: Secondary | ICD-10-CM

## 2016-04-02 DIAGNOSIS — Z3481 Encounter for supervision of other normal pregnancy, first trimester: Secondary | ICD-10-CM | POA: Diagnosis not present

## 2016-04-02 DIAGNOSIS — F17210 Nicotine dependence, cigarettes, uncomplicated: Secondary | ICD-10-CM | POA: Diagnosis not present

## 2016-04-02 LAB — POCT URINALYSIS DIPSTICK
Blood, UA: NEGATIVE
GLUCOSE UA: NEGATIVE
Ketones, UA: NEGATIVE
LEUKOCYTES UA: NEGATIVE
NITRITE UA: NEGATIVE
Protein, UA: NEGATIVE

## 2016-04-02 MED ORDER — NICOTINE 21 MG/24HR TD PT24: 21.0000 mg | MEDICATED_PATCH | Freq: Every day | TRANSDERMAL | 1 refills | Status: DC

## 2016-04-02 NOTE — Progress Notes (Signed)
[place] 12+2 wks,measurement c/w dates,crl 64.0 mm,normal ov's bilat,fhr 159 bpm,ant pl,NT 2.0 mm,NB present

## 2016-04-02 NOTE — Progress Notes (Signed)
G2P1001 [redacted]w[redacted]d Estimated Date of Delivery: 10/13/16  Last menstrual period 01/07/2016.   BP weight and urine results all reviewed and noted.  Please refer to the obstetrical flow sheet for the fundal height and fetal heart rate documentation:    Wants to quit smoking.  Declines Quit line.  Discussed strategies, including e cigs.  Has used patches in the past. Wants to try those .  [place] 12+2 wks,measurement c/w dates,crl 64.0 mm,normal ov's bilat,fhr 159 bpm,ant pl,NT 2.0 mm,NB present   Patient denies any bleeding and no rupture of membranes symptoms or regular contractions. Patient is without complaints. All questions were answered.  No orders of the defined types were placed in this encounter.   Plan:  Continued routine obstetrical care, NT/IT today.  May DC vaginal progesterone supplement  Try Nicotine patch.  May try E cigs if patch doesn't help.      Return in about 4 weeks (around 04/30/2016) for [place], 2nd IT.

## 2016-04-02 NOTE — Patient Instructions (Addendum)
Second Trimester of Pregnancy The second trimester is from week 13 through week 28, months 4 through 6. The second trimester is often a time when you feel your best. Your body has also adjusted to being pregnant, and you begin to feel better physically. Usually, morning sickness has lessened or quit completely, you may have more energy, and you may have an increase in appetite. The second trimester is also a time when the fetus is growing rapidly. At the end of the sixth month, the fetus is about 9 inches long and weighs about 1 pounds. You will likely begin to feel the baby move (quickening) between 18 and 20 weeks of the pregnancy. BODY CHANGES Your body goes through many changes during pregnancy. The changes vary from woman to woman.   Your weight will continue to increase. You will notice your lower abdomen bulging out.  You may begin to get stretch marks on your hips, abdomen, and breasts.  You may develop headaches that can be relieved by medicines approved by your health care provider.  You may urinate more often because the fetus is pressing on your bladder.  You may develop or continue to have heartburn as a result of your pregnancy.  You may develop constipation because certain hormones are causing the muscles that push waste through your intestines to slow down.  You may develop hemorrhoids or swollen, bulging veins (varicose veins).  You may have back pain because of the weight gain and pregnancy hormones relaxing your joints between the bones in your pelvis and as a result of a shift in weight and the muscles that support your balance.  Your breasts will continue to grow and be tender.  Your gums may bleed and may be sensitive to brushing and flossing.  Dark spots or blotches (chloasma, mask of pregnancy) may develop on your face. This will likely fade after the baby is born.  A dark line from your belly button to the pubic area (linea nigra) may appear. This will likely fade  after the baby is born.  You may have changes in your hair. These can include thickening of your hair, rapid growth, and changes in texture. Some women also have hair loss during or after pregnancy, or hair that feels dry or thin. Your hair will most likely return to normal after your baby is born. WHAT TO EXPECT AT YOUR PRENATAL VISITS During a routine prenatal visit:  You will be weighed to make sure you and the fetus are growing normally.  Your blood pressure will be taken.  Your abdomen will be measured to track your baby's growth.  The fetal heartbeat will be listened to.  Any test results from the previous visit will be discussed. Your health care provider may ask you:  How you are feeling.  If you are feeling the baby move.  If you have had any abnormal symptoms, such as leaking fluid, bleeding, severe headaches, or abdominal cramping.  If you are using any tobacco products, including cigarettes, chewing tobacco, and electronic cigarettes.  If you have any questions. Other tests that may be performed during your second trimester include:  Blood tests that check for:  Low iron levels (anemia).  Gestational diabetes (between 24 and 28 weeks).  Rh antibodies.  Urine tests to check for infections, diabetes, or protein in the urine.  An ultrasound to confirm the proper growth and development of the baby.  An amniocentesis to check for possible genetic problems.  Fetal screens for spina bifida   and Down syndrome.  HIV (human immunodeficiency virus) testing. Routine prenatal testing includes screening for HIV, unless you choose not to have this test. HOME CARE INSTRUCTIONS   Avoid all smoking, herbs, alcohol, and unprescribed drugs. These chemicals affect the formation and growth of the baby.  Do not use any tobacco products, including cigarettes, chewing tobacco, and electronic cigarettes. If you need help quitting, ask your health care provider. You may receive  counseling support and other resources to help you quit.  Follow your health care provider's instructions regarding medicine use. There are medicines that are either safe or unsafe to take during pregnancy.  Exercise only as directed by your health care provider. Experiencing uterine cramps is a good sign to stop exercising.  Continue to eat regular, healthy meals.  Wear a good support bra for breast tenderness.  Do not use hot tubs, steam rooms, or saunas.  Wear your seat belt at all times when driving.  Avoid raw meat, uncooked cheese, cat litter boxes, and soil used by cats. These carry germs that can cause birth defects in the baby.  Take your prenatal vitamins.  Take 1500-2000 mg of calcium daily starting at the 20th week of pregnancy until you deliver your baby.  Try taking a stool softener (if your health care provider approves) if you develop constipation. Eat more high-fiber foods, such as fresh vegetables or fruit and whole grains. Drink plenty of fluids to keep your urine clear or pale yellow.  Take warm sitz baths to soothe any pain or discomfort caused by hemorrhoids. Use hemorrhoid cream if your health care provider approves.  If you develop varicose veins, wear support hose. Elevate your feet for 15 minutes, 3-4 times a day. Limit salt in your diet.  Avoid heavy lifting, wear low heel shoes, and practice good posture.  Rest with your legs elevated if you have leg cramps or low back pain.  Visit your dentist if you have not gone yet during your pregnancy. Use a soft toothbrush to brush your teeth and be gentle when you floss.  A sexual relationship may be continued unless your health care provider directs you otherwise.  Continue to go to all your prenatal visits as directed by your health care provider. SEEK MEDICAL CARE IF:   You have dizziness.  You have mild pelvic cramps, pelvic pressure, or nagging pain in the abdominal area.  You have persistent nausea,  vomiting, or diarrhea.  You have a bad smelling vaginal discharge.  You have pain with urination. SEEK IMMEDIATE MEDICAL CARE IF:   You have a fever.  You are leaking fluid from your vagina.  You have spotting or bleeding from your vagina.  You have severe abdominal cramping or pain.  You have rapid weight gain or loss.  You have shortness of breath with chest pain.  You notice sudden or extreme swelling of your face, hands, ankles, feet, or legs.  You have not felt your baby move in over an hour.  You have severe headaches that do not go away with medicine.  You have vision changes.   This information is not intended to replace advice given to you by your health care provider. Make sure you discuss any questions you have with your health care provider.   Document Released: 07/02/2001 Document Revised: 07/29/2014 Document Reviewed: 09/08/2012 Elsevier Interactive Patient Education 2016 [place]. Round Ligament Pain The round ligament is a cord of muscle and tissue that helps to support the uterus. It can become  a source of pain during pregnancy if it becomes stretched or twisted as the baby grows. The pain usually begins in the second trimester of pregnancy, and it can come and go until the baby is delivered. It is not a serious problem, and it does not cause harm to the baby. Round ligament pain is usually a short, sharp, and pinching pain, but it can also be a dull, lingering, and aching pain. The pain is felt in the lower side of the abdomen or in the groin. It usually starts deep in the groin and moves up to the outside of the hip area. Pain can occur with:  A sudden change in position.  Rolling over in bed.  Coughing or sneezing.  Physical activity. HOME CARE INSTRUCTIONS Watch your condition for any changes. Take these steps to help with your pain:  When the pain starts, relax. Then try:  Sitting down.  Flexing your knees up to your abdomen.  Lying on  your side with one pillow under your abdomen and another pillow between your legs.  Sitting in a warm bath for 15-20 minutes or until the pain goes away.  Take over-the-counter and prescription medicines only as told by your health care provider.  Move slowly when you sit and stand.  Avoid long walks if they cause pain.  Stop or lessen your physical activities if they cause pain. SEEK MEDICAL CARE IF:  Your pain does not go away with treatment.  You feel pain in your back that you did not have before.  Your medicine is not helping. SEEK IMMEDIATE MEDICAL CARE IF:  You develop a fever or chills.  You develop uterine contractions.  You develop vaginal bleeding.  You develop nausea or vomiting.  You develop diarrhea.  You have pain when you urinate.   This information is not intended to replace advice given to you by your health care provider. Make sure you discuss any questions you have with your health care provider.   Document Released: 04/16/2008 Document Revised: 09/30/2011 Document Reviewed: 09/14/2014 Elsevier Interactive Patient Education 2016 [place]

## 2016-04-04 ENCOUNTER — Telehealth: Payer: Self-pay | Admitting: Advanced Practice Midwife

## 2016-04-04 NOTE — Telephone Encounter (Signed)
Pt informed RX for nicotine patch e-scribed on 04/02/2016 and pharmacist from [facility] does have the Rx.

## 2016-04-05 LAB — MATERNAL SCREEN, INTEGRATED #1
CROWN RUMP LENGTH MAT SCREEN: 64 mm
GEST. AGE ON COLLECTION DATE: 12.7 wk
MATERNAL AGE AT EDD: 26.8 a
NUCHAL TRANSLUCENCY (NT): 2 mm
Number of Fetuses: 1
PAPP-A VALUE: 473.6 ng/mL
WEIGHT: 213 [lb_av]

## 2016-04-24 ENCOUNTER — Telehealth: Payer: Self-pay | Admitting: Women's Health

## 2016-04-24 NOTE — Telephone Encounter (Signed)
Pt states she has a yeast infection, advised her to use Monistat 7 OTC and if not improved make appointment to be seen here.  Pt verbalized understanding and states she has an appointment here for next Tuesday already scheduled for routine OB care.

## 2016-04-30 ENCOUNTER — Encounter: Payer: Self-pay | Admitting: Advanced Practice Midwife

## 2016-04-30 ENCOUNTER — Ambulatory Visit (INDEPENDENT_AMBULATORY_CARE_PROVIDER_SITE_OTHER): Payer: Medicaid Other | Admitting: Advanced Practice Midwife

## 2016-04-30 VITALS — BP 144/76 | HR 92 | Wt 211.0 lb

## 2016-04-30 DIAGNOSIS — O0992 Supervision of high risk pregnancy, unspecified, second trimester: Secondary | ICD-10-CM | POA: Diagnosis not present

## 2016-04-30 DIAGNOSIS — Z3682 Encounter for antenatal screening for nuchal translucency: Secondary | ICD-10-CM

## 2016-04-30 DIAGNOSIS — Z3A16 16 weeks gestation of pregnancy: Secondary | ICD-10-CM | POA: Diagnosis not present

## 2016-04-30 DIAGNOSIS — O10919 Unspecified pre-existing hypertension complicating pregnancy, unspecified trimester: Secondary | ICD-10-CM

## 2016-04-30 DIAGNOSIS — I10 Essential (primary) hypertension: Secondary | ICD-10-CM | POA: Insufficient documentation

## 2016-04-30 DIAGNOSIS — Z1389 Encounter for screening for other disorder: Secondary | ICD-10-CM

## 2016-04-30 DIAGNOSIS — O132 Gestational [pregnancy-induced] hypertension without significant proteinuria, second trimester: Secondary | ICD-10-CM | POA: Diagnosis not present

## 2016-04-30 DIAGNOSIS — Z23 Encounter for immunization: Secondary | ICD-10-CM | POA: Diagnosis not present

## 2016-04-30 DIAGNOSIS — Z331 Pregnant state, incidental: Secondary | ICD-10-CM

## 2016-04-30 DIAGNOSIS — F17200 Nicotine dependence, unspecified, uncomplicated: Secondary | ICD-10-CM

## 2016-04-30 DIAGNOSIS — Z363 Encounter for antenatal screening for malformations: Secondary | ICD-10-CM

## 2016-04-30 LAB — POCT URINALYSIS DIPSTICK
Blood, UA: NEGATIVE
GLUCOSE UA: NEGATIVE
KETONES UA: NEGATIVE
LEUKOCYTES UA: NEGATIVE
Nitrite, UA: NEGATIVE
PROTEIN UA: NEGATIVE

## 2016-04-30 MED ORDER — ASPIRIN EC 81 MG PO TBEC: 81.0000 mg | DELAYED_RELEASE_TABLET | Freq: Every day | ORAL | 6 refills | Status: DC

## 2016-04-30 NOTE — Progress Notes (Addendum)
G2P1001 [redacted]w[redacted]d Estimated Date of Delivery: 10/13/16  Blood pressure (!) 144/76, pulse 92, weight 211 lb (95.7 kg), last menstrual period 01/07/2016.   BP weight and urine results all reviewed and noted. Pt probably CHTN.  Will treat as such.  Please refer to the obstetrical flow sheet for the fundal height and fetal heart rate documentation:  Patient reports good fetal movement, denies any bleeding and no rupture of membranes symptoms or regular contractions. Patient is without complaints. All questions were answered.  Orders Placed This Encounter  Procedures  . US OB Comp + 14 Wk  . Flu Vaccine QUAD 36+ mos IM  . Maternal Screen, Integrated #2  . POCT urinalysis dipstick    Plan:  Continued routine obstetrical care, 2nd IT today.   Baby ASA;  Growth u/s @ 20, 28, 34, 38wks     2x/wk testing nst/sono @ 32wks    Deliver @  40wks (no meds):   Return in about 3 weeks (around 05/21/2016) for LROB, [id number].

## 2016-05-02 LAB — MATERNAL SCREEN, INTEGRATED #2
ADSF: 0.94
AFP MoM: 0.98
Alpha-Fetoprotein: 25.3 ng/mL
Crown Rump Length: 64 mm
DIA MoM: 2.18
DIA VALUE: 308.2 pg/mL
Estriol, Unconjugated: 0.8 ng/mL
Gest. Age on Collection Date: 12.7 weeks
Gestational Age: 16.7 weeks
HCG MOM: 1.28
HCG VALUE: 29.7 [IU]/mL
Maternal Age at EDD: 26.8 years
NUCHAL TRANSLUCENCY (NT): 2 mm
NUCHAL TRANSLUCENCY MOM: 1.24
Number of Fetuses: 1
PAPP-A MoM: 0.7
PAPP-A Value: 473.6 ng/mL
Test Results:: NEGATIVE
WEIGHT: 213 [lb_av]
Weight: 213 [lb_av]

## 2016-05-21 ENCOUNTER — Ambulatory Visit (INDEPENDENT_AMBULATORY_CARE_PROVIDER_SITE_OTHER): Payer: Medicaid Other | Admitting: Obstetrics & Gynecology

## 2016-05-21 ENCOUNTER — Encounter: Payer: Self-pay | Admitting: Obstetrics & Gynecology

## 2016-05-21 ENCOUNTER — Ambulatory Visit (INDEPENDENT_AMBULATORY_CARE_PROVIDER_SITE_OTHER): Payer: Medicaid Other

## 2016-05-21 VITALS — BP 128/80 | HR 72 | Wt 213.0 lb

## 2016-05-21 DIAGNOSIS — Z3A20 20 weeks gestation of pregnancy: Secondary | ICD-10-CM

## 2016-05-21 DIAGNOSIS — Z331 Pregnant state, incidental: Secondary | ICD-10-CM | POA: Diagnosis not present

## 2016-05-21 DIAGNOSIS — O132 Gestational [pregnancy-induced] hypertension without significant proteinuria, second trimester: Secondary | ICD-10-CM

## 2016-05-21 DIAGNOSIS — Z363 Encounter for antenatal screening for malformations: Secondary | ICD-10-CM | POA: Diagnosis not present

## 2016-05-21 DIAGNOSIS — O0992 Supervision of high risk pregnancy, unspecified, second trimester: Secondary | ICD-10-CM

## 2016-05-21 DIAGNOSIS — O321XX1 Maternal care for breech presentation, fetus 1: Secondary | ICD-10-CM | POA: Diagnosis not present

## 2016-05-21 DIAGNOSIS — Z1389 Encounter for screening for other disorder: Secondary | ICD-10-CM

## 2016-05-21 DIAGNOSIS — Z3492 Encounter for supervision of normal pregnancy, unspecified, second trimester: Secondary | ICD-10-CM

## 2016-05-21 LAB — POCT URINALYSIS DIPSTICK
GLUCOSE UA: NEGATIVE
Ketones, UA: NEGATIVE
Leukocytes, UA: NEGATIVE
NITRITE UA: NEGATIVE
Protein, UA: NEGATIVE
RBC UA: NEGATIVE

## 2016-05-21 NOTE — Progress Notes (Signed)
G2P1001 [redacted]w[redacted]d Estimated Date of Delivery: 10/13/16  Blood pressure 128/80, pulse 72, weight 213 lb (96.6 kg), last menstrual period 01/07/2016.   BP weight and urine results all reviewed and noted.  Please refer to the obstetrical flow sheet for the fundal height and fetal heart rate documentation:  Patient reports good fetal movement, denies any bleeding and no rupture of membranes symptoms or regular contractions. Patient is without complaints. All questions were answered.  Orders Placed This Encounter  Procedures  . POCT urinalysis dipstick    Plan:  Continued routine obstetrical care, sonogram is normal  Return in about 4 weeks (around 06/18/2016) for LROB.

## 2016-05-21 NOTE — Progress Notes (Signed)
[place] 19+2 wks,breech,ant pl gr 0,bilat adnexa's wnl,cx 4 cm,svp of fluid 3.8 cm,fhr 153 bpm,efw 312 g limited view of heart,please have pt come back for additional images,no abnormalities seen

## 2016-06-03 ENCOUNTER — Telehealth: Payer: Self-pay | Admitting: Obstetrics & Gynecology

## 2016-06-03 NOTE — Telephone Encounter (Signed)
Pt called stating that she would like a all back from a nurse, Pt didn't state the reason why. Please contact pt

## 2016-06-03 NOTE — Telephone Encounter (Signed)
Pt c/o lower left side of abdominal pain and lower back. Pt states she thinks she may have a UTI, +FM, no vaginal bleeding. Pt given an appt for evaluation.

## 2016-06-04 ENCOUNTER — Encounter: Payer: Self-pay | Admitting: Advanced Practice Midwife

## 2016-06-04 ENCOUNTER — Ambulatory Visit (INDEPENDENT_AMBULATORY_CARE_PROVIDER_SITE_OTHER): Payer: Medicaid Other | Admitting: Advanced Practice Midwife

## 2016-06-04 VITALS — BP 130/72 | HR 110 | Wt 217.0 lb

## 2016-06-04 DIAGNOSIS — R300 Dysuria: Secondary | ICD-10-CM | POA: Diagnosis not present

## 2016-06-04 DIAGNOSIS — O0992 Supervision of high risk pregnancy, unspecified, second trimester: Secondary | ICD-10-CM

## 2016-06-04 DIAGNOSIS — Z3A22 22 weeks gestation of pregnancy: Secondary | ICD-10-CM

## 2016-06-04 DIAGNOSIS — Z331 Pregnant state, incidental: Secondary | ICD-10-CM

## 2016-06-04 DIAGNOSIS — Z1389 Encounter for screening for other disorder: Secondary | ICD-10-CM

## 2016-06-04 DIAGNOSIS — O132 Gestational [pregnancy-induced] hypertension without significant proteinuria, second trimester: Secondary | ICD-10-CM

## 2016-06-04 DIAGNOSIS — I10 Essential (primary) hypertension: Secondary | ICD-10-CM

## 2016-06-04 LAB — POCT URINALYSIS DIPSTICK
Glucose, UA: NEGATIVE
KETONES UA: NEGATIVE
Leukocytes, UA: NEGATIVE
NITRITE UA: NEGATIVE
PROTEIN UA: NEGATIVE
RBC UA: NEGATIVE

## 2016-06-04 NOTE — Progress Notes (Signed)
WORK IN FOR LLQ pain, some pain when bladder is full.  Denies dysuria, frequency, urgency.  Wants to make sure it's not a UTI.   G2P1001 [redacted]w[redacted]d Estimated Date of Delivery: 10/13/16  Blood pressure 130/72, pulse (!) 110, weight 217 lb (98.4 kg), last menstrual period 01/07/2016.   BP weight and urine results all reviewed and noted. Urine dipped negative  Please refer to the obstetrical flow sheet for the fundal height and fetal heart rate documentation:  Patient reports good fetal movement, denies any bleeding and no rupture of membranes symptoms or regular contractions. Patient is without complaints. All questions were answered.  Orders Placed This Encounter  Procedures  . Urine culture  . POCT urinalysis dipstick    Plan:  Continued routine obstetrical care,   No Follow-up on file.    Marland Kitchen

## 2016-06-06 LAB — URINE CULTURE

## 2016-06-16 ENCOUNTER — Emergency Department (HOSPITAL_COMMUNITY)
Admission: EM | Admit: 2016-06-16 | Discharge: 2016-06-16 | Disposition: A | Discharge status: Home / Self Care | Payer: Medicaid Other | Attending: Emergency Medicine | Admitting: Emergency Medicine

## 2016-06-16 ENCOUNTER — Emergency Department (HOSPITAL_COMMUNITY): Payer: Medicaid Other

## 2016-06-16 ENCOUNTER — Encounter (HOSPITAL_COMMUNITY): Payer: Self-pay | Admitting: Emergency Medicine

## 2016-06-16 DIAGNOSIS — J069 Acute upper respiratory infection, unspecified: Secondary | ICD-10-CM | POA: Diagnosis not present

## 2016-06-16 DIAGNOSIS — Z7982 Long term (current) use of aspirin: Secondary | ICD-10-CM | POA: Insufficient documentation

## 2016-06-16 DIAGNOSIS — F17210 Nicotine dependence, cigarettes, uncomplicated: Secondary | ICD-10-CM | POA: Diagnosis not present

## 2016-06-16 DIAGNOSIS — O99512 Diseases of the respiratory system complicating pregnancy, second trimester: Secondary | ICD-10-CM | POA: Insufficient documentation

## 2016-06-16 DIAGNOSIS — R0789 Other chest pain: Secondary | ICD-10-CM | POA: Insufficient documentation

## 2016-06-16 DIAGNOSIS — I10 Essential (primary) hypertension: Secondary | ICD-10-CM | POA: Insufficient documentation

## 2016-06-16 DIAGNOSIS — O99332 Smoking (tobacco) complicating pregnancy, second trimester: Secondary | ICD-10-CM | POA: Insufficient documentation

## 2016-06-16 DIAGNOSIS — Z3A23 23 weeks gestation of pregnancy: Secondary | ICD-10-CM | POA: Diagnosis not present

## 2016-06-16 DIAGNOSIS — R0602 Shortness of breath: Secondary | ICD-10-CM

## 2016-06-16 DIAGNOSIS — Z79899 Other long term (current) drug therapy: Secondary | ICD-10-CM | POA: Insufficient documentation

## 2016-06-16 LAB — COMPREHENSIVE METABOLIC PANEL
ALK PHOS: 60 U/L (ref 38–126)
ALT: 16 U/L (ref 14–54)
ANION GAP: 7 (ref 5–15)
AST: 16 U/L (ref 15–41)
Albumin: 3.1 g/dL — ABNORMAL LOW (ref 3.5–5.0)
BILIRUBIN TOTAL: 0.2 mg/dL — AB (ref 0.3–1.2)
BUN: 6 mg/dL (ref 6–20)
CALCIUM: 8.9 mg/dL (ref 8.9–10.3)
CO2: 21 mmol/L — ABNORMAL LOW (ref 22–32)
Chloride: 106 mmol/L (ref 101–111)
Creatinine, Ser: 0.37 mg/dL — ABNORMAL LOW (ref 0.44–1.00)
GFR calc non Af Amer: >60 mL/min (ref >60)
GLUCOSE: 107 mg/dL — AB (ref 65–99)
Potassium: 3.7 mmol/L (ref 3.5–5.1)
Sodium: 134 mmol/L — ABNORMAL LOW (ref 135–145)
TOTAL PROTEIN: 7.1 g/dL (ref 6.5–8.1)

## 2016-06-16 LAB — CBC WITH DIFFERENTIAL/PLATELET
BASOS ABS: 0 10*3/uL (ref 0.0–0.1)
BASOS PCT: 0 %
EOS ABS: 0.2 10*3/uL (ref 0.0–0.7)
Eosinophils Relative: 2 %
HEMATOCRIT: 35.1 % — AB (ref 36.0–46.0)
Hemoglobin: 12.1 g/dL (ref 12.0–15.0)
Lymphocytes Relative: 23 %
Lymphs Abs: 3.4 10*3/uL (ref 0.7–4.0)
MCH: 30.9 pg (ref 26.0–34.0)
MCHC: 34.5 g/dL (ref 30.0–36.0)
MCV: 89.5 fL (ref 78.0–100.0)
MONO ABS: 1.1 10*3/uL — AB (ref 0.1–1.0)
Monocytes Relative: 7 %
NEUTROS ABS: 10.2 10*3/uL — AB (ref 1.7–7.7)
NEUTROS PCT: 68 %
Platelets: 309 10*3/uL (ref 150–400)
RBC: 3.92 MIL/uL (ref 3.87–5.11)
RDW: 12.7 % (ref 11.5–15.5)
WBC: 14.9 10*3/uL — ABNORMAL HIGH (ref 4.0–10.5)

## 2016-06-16 NOTE — ED Provider Notes (Signed)
[facility] DEPT Provider Note   CSN: [id number] Arrival date & time: 06/16/16  1901   By signing my name below, I, Marissa Lane, attest that this documentation has been prepared under the direction and in the presence of Marissa Gambler, MD . Electronically Signed: Neta Lane, ED Scribe. 06/16/2016. 8:46 PM.   History   Chief Complaint Chief Complaint  Patient presents with  . Shortness of Breath    The history is provided by the patient. No language interpreter was used.   HPI Comments:  Marissa Lane is a 25 y.o. female who presents to the Emergency Department complaining of constant SOB x today. Pt reports that she has been having a non-productive cough for 1 week, but began having SOB today. Pt states that she feels like she has something in her chest. Pt complains of associated nasal congestion and chest pressure. Pt notes that the SOB is worsened with activity. Pt is G2P1A0 and is currently [redacted] weeks pregnant and reports that she has had mild HTN with her pregnancy. No alleviating factors noted. Pt denies fever. No abdominal pain, vaginal bleeding or leakage of fluid.   Past Medical History:  Diagnosis Date  . Acne   . Chest pain   . Dysthymic disorder    over dose age 21  . GERD (gastroesophageal reflux disease)   . H/O urinary frequency 06/25/2013  . Hyperinsulinemia   . IFG (impaired fasting glucose)   . Irregular intermenstrual bleeding 10/12/2013  . Obesity   . Palpitations   . PCOS (polycystic ovarian syndrome)   . Postpartum depression   . Tobacco abuse     Patient Active Problem List   Diagnosis Date Noted  . Hypertension 04/30/2016  . Pregnancy, supervision, high-risk, second trimester 03/05/2016  . Smoker 03/05/2016  . Low serum progesterone 02/07/2016  . Migraine without aura and with status migrainosus, not intractable 04/06/2015  . Amenorrhea 04/04/2014  . Back spasm 11/08/2013  . Irregular intermenstrual bleeding 10/12/2013  .  Anxiety 09/21/2013  . H/O urinary frequency 06/25/2013  . Abdominal wall pain in suprapubic region 05/28/2013  . PCOS (polycystic ovarian syndrome) 12/11/2012  . Erosive esophagitis 11/05/2012  . GERD (gastroesophageal reflux disease) 09/02/2012  . Morbid obesity ([place]) 09/14/2010  . GASTROESOPHAGEAL REFLUX DISEASE 09/14/2010  . PALPITATIONS 09/14/2010  . CHEST PAIN 09/14/2010    Past Surgical History:  Procedure Laterality Date  . CHOLECYSTECTOMY  03/06/2012   Procedure: LAPAROSCOPIC CHOLECYSTECTOMY;  Surgeon: Donato Heinz, MD;  Location: [facility] ORS;  Service: General;  Laterality: N/A;  . ESOPHAGOGASTRODUODENOSCOPY N/A 09/09/2012   Procedure: ESOPHAGOGASTRODUODENOSCOPY (EGD);  Surgeon: Rogene Houston, MD;  Location: [facility] ENDO SUITE;  Service: Endoscopy;  Laterality: N/A;  340  . MOUTH SURGERY    . SKIN GRAFT  2009   gum graft  . TONSILLECTOMY      OB History    Gravida Para Term Preterm AB Living   2 1 1     1    SAB TAB Ectopic Multiple Live Births           1       Home Medications    Prior to Admission medications   Medication Sig Start Date End Date Taking? Authorizing Provider  acetaminophen (TYLENOL) 500 MG tablet Take 500 mg by mouth every 6 (six) hours as needed.   Yes Historical Provider, MD  aspirin EC 81 MG tablet Take 1 tablet (81 mg total) by mouth daily. 04/30/16  Yes Christin Fudge, CNM  Prenatal Vit-Fe Fumarate-FA (PRENATAL VITAMIN PO) Take 1 tablet by mouth daily.   Yes Historical Provider, MD  ranitidine (ZANTAC) 150 MG capsule Take 150 mg by mouth 2 (two) times daily.   Yes Historical Provider, MD    Family History Family History  Problem Relation Age of Onset  . Adopted: Yes  . Congestive Heart Failure Mother   . Irritable bowel syndrome Mother   . Ulcers Mother   . Anemia Mother   . Hypertension Brother   . Colon cancer Maternal Grandmother 70  . Heart disease Maternal Grandfather 65    MI    Social History Social History    Substance Use Topics  . Smoking status: Current Every Day Smoker    Packs/day: 0.50    Years: 5.00    Types: Cigarettes  . Smokeless tobacco: Never Used  . Alcohol use No     Allergies   Cymbalta [duloxetine hcl]; Vicodin [hydrocodone-acetaminophen]; and Zofran [ondansetron hcl]   Review of Systems Review of Systems  Constitutional: Negative for fever.  HENT: Positive for congestion.   Respiratory: Positive for cough, chest tightness and shortness of breath.   All other systems reviewed and are negative.    Physical Exam Updated Vital Signs BP 109/63 (BP Location: Left Arm)   Pulse 82   Temp 98.4 F (36.9 C) (Oral)   Resp 18   Ht 5\' 4"  (1.626 m)   Wt 217 lb 8 oz (98.7 kg)   LMP 01/07/2016 (Exact Date)   SpO2 99%   BMI 37.33 kg/m   Physical Exam  Constitutional: She is oriented to person, place, and time. She appears well-developed and well-nourished.  HENT:  Head: Normocephalic and atraumatic.  Right Ear: External ear normal.  Left Ear: External ear normal.  Nose: Nose normal.  Eyes: Right eye exhibits no discharge. Left eye exhibits no discharge.  Cardiovascular: Normal rate, regular rhythm and normal heart sounds.   Pulmonary/Chest: Effort normal and breath sounds normal.  Abdominal: Soft. There is no tenderness.  Gravid  Musculoskeletal: She exhibits no edema.  Neurological: She is alert and oriented to person, place, and time.  Skin: Skin is warm and dry.  Nursing note and vitals reviewed.    ED Treatments / Results  DIAGNOSTIC STUDIES:  Oxygen Saturation is 100% on RA, normal by my interpretation.    COORDINATION OF CARE:  8:46 PM Discussed treatment plan with pt at bedside and pt agreed to plan.   Labs (all labs ordered are listed, but only abnormal results are displayed) Labs Reviewed  COMPREHENSIVE METABOLIC PANEL - Abnormal; Notable for the following:       Result Value   Sodium 134 (*)    CO2 21 (*)    Glucose, Bld 107 (*)     Creatinine, Ser 0.37 (*)    Albumin 3.1 (*)    Total Bilirubin 0.2 (*)    All other components within normal limits  CBC WITH DIFFERENTIAL/PLATELET - Abnormal; Notable for the following:    WBC 14.9 (*)    HCT 35.1 (*)    Neutro Abs 10.2 (*)    Monocytes Absolute 1.1 (*)    All other components within normal limits    EKG  EKG Interpretation  Date/Time:  Sunday June 16 2016 21:03:21 EST Ventricular Rate:  94 PR Interval:    QRS Duration: 86 QT Interval:  340 QTC Calculation: 426 R Axis:   94 Text Interpretation:  Sinus rhythm Borderline right axis deviation Borderline repolarization abnormality  Baseline wander in lead(s) III similar to 2012 Confirmed by Loxley Cibrian MD, [place] [phone]) on 06/16/2016 9:58:52 PM       Radiology Dg Chest 2 View  Result Date: 06/16/2016 CLINICAL DATA:  Cough, dyspnea. EXAM: CHEST  2 VIEW COMPARISON:  Radiographs of March 10, 2015. FINDINGS: The heart size and mediastinal contours are within normal limits. Both lungs are clear. No pneumothorax or pleural effusion is noted. The visualized skeletal structures are unremarkable. IMPRESSION: No active cardiopulmonary disease. Electronically Signed   By: Marijo Conception, M.D.   On: 06/16/2016 21:52    Procedures Procedures (including critical care time)  Medications Ordered in ED Medications - No data to display   Initial Impression / Assessment and Plan / ED Course  I have reviewed the triage vital signs and the nursing notes.  Pertinent labs & imaging results that were available during my care of the patient were reviewed by me and considered in my medical decision making (see chart for details).  Clinical Course as of Jun 18 127  Sun Jun 16, 2016  2050 CXR, labs, ECG. Likely viral URI. My suspicion for PE is quite low. Understands possible radiation to fetus but will place shield with xray.  [SG]    Clinical Course User Index [SG] Marissa Gambler, MD    Will treat as URI. Labs, CXR, ECG  unremarkable. My suspicion for ACS, PE, PNA is low. Suggested cough suppressants. She declines meds currently. Overall very well appearing. No OB/GYN symptoms. Return precautions, f/u with OB  Final Clinical Impressions(s) / ED Diagnoses   Final diagnoses:  SOB (shortness of breath)  Upper respiratory infection with cough and congestion    New Prescriptions Discharge Medication List as of 06/16/2016 10:35 PM     I personally performed the services described in this documentation, which was scribed in my presence. The recorded information has been reviewed and is accurate.    Marissa Gambler, MD 06/17/16 0130

## 2016-06-16 NOTE — ED Triage Notes (Signed)
Pt states she has had a non-productive cough x 1 week but starting having some SOB today. Pt is currently [redacted]weeks pregnant so [facility] and [facility] recommended that she be seen.

## 2016-06-17 ENCOUNTER — Telehealth: Payer: Self-pay | Admitting: Family Medicine

## 2016-06-17 NOTE — Telephone Encounter (Signed)
Marissa Lane called stating that she was seen in the er yesterday due to her not feeling better. Marissa Lane states the er said nothing was wrong with her. Marissa Lane reviewed her lab results on her mychart and states that her wbc is high. Marissa Lane is wanting dr Nicki Reaper to review those labs to make sure she doesn't need to be rechecked. Please advise.

## 2016-06-17 NOTE — Telephone Encounter (Signed)
Although white blood count can go up with viral illnesses I suggested the patient is having any particular worries or concerns that she be rechecked here. I did look at her blood work and x-ray.

## 2016-06-17 NOTE — Telephone Encounter (Signed)
Spoke with patient and informed her per Dr.Scott Luking- Although white blood count can go up with viral illnesses Dr.Scott suggested the patient is having any particular worries or concerns that she be rechecked here. Dr.Scott did look at your blood work and Insurance account manager. Patient verbalized understanding.

## 2016-06-18 ENCOUNTER — Ambulatory Visit (INDEPENDENT_AMBULATORY_CARE_PROVIDER_SITE_OTHER): Payer: Medicaid Other | Admitting: Women's Health

## 2016-06-18 ENCOUNTER — Encounter: Payer: Self-pay | Admitting: Women's Health

## 2016-06-18 VITALS — BP 134/76 | HR 72 | Wt 219.0 lb

## 2016-06-18 DIAGNOSIS — O0992 Supervision of high risk pregnancy, unspecified, second trimester: Secondary | ICD-10-CM | POA: Diagnosis not present

## 2016-06-18 DIAGNOSIS — Z3A23 23 weeks gestation of pregnancy: Secondary | ICD-10-CM | POA: Diagnosis not present

## 2016-06-18 DIAGNOSIS — O0990 Supervision of high risk pregnancy, unspecified, unspecified trimester: Secondary | ICD-10-CM

## 2016-06-18 DIAGNOSIS — O10919 Unspecified pre-existing hypertension complicating pregnancy, unspecified trimester: Secondary | ICD-10-CM

## 2016-06-18 DIAGNOSIS — Z331 Pregnant state, incidental: Secondary | ICD-10-CM

## 2016-06-18 DIAGNOSIS — O99212 Obesity complicating pregnancy, second trimester: Secondary | ICD-10-CM | POA: Diagnosis not present

## 2016-06-18 DIAGNOSIS — O132 Gestational [pregnancy-induced] hypertension without significant proteinuria, second trimester: Secondary | ICD-10-CM

## 2016-06-18 DIAGNOSIS — Z1389 Encounter for screening for other disorder: Secondary | ICD-10-CM | POA: Diagnosis not present

## 2016-06-18 DIAGNOSIS — Z3482 Encounter for supervision of other normal pregnancy, second trimester: Secondary | ICD-10-CM

## 2016-06-18 DIAGNOSIS — Z363 Encounter for antenatal screening for malformations: Secondary | ICD-10-CM

## 2016-06-18 LAB — POCT URINALYSIS DIPSTICK
Blood, UA: NEGATIVE
Glucose, UA: NEGATIVE
KETONES UA: NEGATIVE
LEUKOCYTES UA: NEGATIVE
Nitrite, UA: NEGATIVE
Protein, UA: NEGATIVE

## 2016-06-18 NOTE — Patient Instructions (Signed)
You will have your sugar test next visit.  Please do not eat or drink anything after midnight the night before you come, not even water.  You will be here for at least two hours.     Call the office [phone]) or go to [facility] if:  You begin to have strong, frequent contractions  Your water breaks.  Sometimes it is a big gush of fluid, sometimes it is just a trickle that keeps getting your panties wet or running down your legs  You have vaginal bleeding.  It is normal to have a small amount of spotting if your cervix was checked.   You don't feel your baby moving like normal.  If you don't, get you something to eat and drink and lay down and focus on feeling your baby move.   If your baby is still not moving like normal, you should call the office or go to [facility] P[facility] The second trimester is from week 13 through week 28, months 4 through 6. The second trimester is often a time when you feel your best. Your body has also adjusted to being pregnant, and you begin to feel better physically. Usually, morning sickness has lessened or quit completely, you may have more energy, and you may have an increase in appetite. The second trimester is also a time when the fetus is growing rapidly. At the end of the sixth month, the fetus is about 9 inches long and weighs about 1 pounds. You will likely begin to feel the baby move (quickening) between 18 and 20 weeks of the pregnancy. BODY CHANGES Your body goes through many changes during pregnancy. The changes vary from woman to woman.   Your weight will continue to increase. You will notice your lower abdomen bulging out.  You may begin to get stretch marks on your hips, abdomen, and breasts.  You may develop headaches that can be relieved by medicines approved by your health care provider.  You may urinate more often because the fetus is pressing on your bladder.  You may develop or continue to have  heartburn as a result of your pregnancy.  You may develop constipation because certain hormones are causing the muscles that push waste through your intestines to slow down.  You may develop hemorrhoids or swollen, bulging veins (varicose veins).  You may have back pain because of the weight gain and pregnancy hormones relaxing your joints between the bones in your pelvis and as a result of a shift in weight and the muscles that support your balance.  Your breasts will continue to grow and be tender.  Your gums may bleed and may be sensitive to brushing and flossing.  Dark spots or blotches (chloasma, mask of pregnancy) may develop on your face. This will likely fade after the baby is born.  A dark line from your belly button to the pubic area (linea nigra) may appear. This will likely fade after the baby is born.  You may have changes in your hair. These can include thickening of your hair, rapid growth, and changes in texture. Some women also have hair loss during or after pregnancy, or hair that feels dry or thin. Your hair will most likely return to normal after your baby is born. WHAT TO EXPECT AT YOUR PRENATAL VISITS During a routine prenatal visit:  You will be weighed to make sure you and the fetus are growing normally.  Your blood pressure will be taken.  Your abdomen will be measured to track your baby's growth.  The fetal heartbeat will be listened to.  Any test results from the previous visit will be discussed. Your health care provider may ask you:  How you are feeling.  If you are feeling the baby move.  If you have had any abnormal symptoms, such as leaking fluid, bleeding, severe headaches, or abdominal cramping.  If you have any questions. Other tests that may be performed during your second trimester include:  Blood tests that check for:  Low iron levels (anemia).  Gestational diabetes (between 24 and 28 weeks).  Rh antibodies.  Urine tests to check  for infections, diabetes, or protein in the urine.  An ultrasound to confirm the proper growth and development of the baby.  An amniocentesis to check for possible genetic problems.  Fetal screens for spina bifida and Down syndrome. HOME CARE INSTRUCTIONS   Avoid all smoking, herbs, alcohol, and unprescribed drugs. These chemicals affect the formation and growth of the baby.  Follow your health care provider's instructions regarding medicine use. There are medicines that are either safe or unsafe to take during pregnancy.  Exercise only as directed by your health care provider. Experiencing uterine cramps is a good sign to stop exercising.  Continue to eat regular, healthy meals.  Wear a good support bra for breast tenderness.  Do not use hot tubs, steam rooms, or saunas.  Wear your seat belt at all times when driving.  Avoid raw meat, uncooked cheese, cat litter boxes, and soil used by cats. These carry germs that can cause birth defects in the baby.  Take your prenatal vitamins.  Try taking a stool softener (if your health care provider approves) if you develop constipation. Eat more high-fiber foods, such as fresh vegetables or fruit and whole grains. Drink plenty of fluids to keep your urine clear or pale yellow.  Take warm sitz baths to soothe any pain or discomfort caused by hemorrhoids. Use hemorrhoid cream if your health care provider approves.  If you develop varicose veins, wear support hose. Elevate your feet for 15 minutes, 3-4 times a day. Limit salt in your diet.  Avoid heavy lifting, wear low heel shoes, and practice good posture.  Rest with your legs elevated if you have leg cramps or low back pain.  Visit your dentist if you have not gone yet during your pregnancy. Use a soft toothbrush to brush your teeth and be gentle when you floss.  A sexual relationship may be continued unless your health care provider directs you otherwise.  Continue to go to all your  prenatal visits as directed by your health care provider. SEEK MEDICAL CARE IF:   You have dizziness.  You have mild pelvic cramps, pelvic pressure, or nagging pain in the abdominal area.  You have persistent nausea, vomiting, or diarrhea.  You have a bad smelling vaginal discharge.  You have pain with urination. SEEK IMMEDIATE MEDICAL CARE IF:   You have a fever.  You are leaking fluid from your vagina.  You have spotting or bleeding from your vagina.  You have severe abdominal cramping or pain.  You have rapid weight gain or loss.  You have shortness of breath with chest pain.  You notice sudden or extreme swelling of your face, hands, ankles, feet, or legs.  You have not felt your baby move in over an hour.  You have severe headaches that do not go away with medicine.  You have vision changes.  Document Released: 07/02/2001 Document Revised: 07/13/2013 Document Reviewed: 09/08/2012 [employer] Patient Information 2015 [employer]. This information is not intended to replace advice given to you by your health care provider. Make sure you discuss any questions you have with your health care provider.     

## 2016-06-18 NOTE — Progress Notes (Signed)
High Risk Pregnancy Diagnosis(es): CHTN G2P1001 [redacted]w[redacted]d Estimated Date of Delivery: 10/13/16 BP 134/76   Pulse 72   Wt 219 lb (99.3 kg)   LMP 01/07/2016 (Exact Date)   BMI 37.59 kg/m   Urinalysis: Negative HPI:  Doing well, went to ED the other day d/t cough, sob, thought she might have pneumonia, cxr was normal, feeling much better now. Got engaged last week and passed NCLEX! BP, weight, and urine reviewed.  Reports good fm. Denies regular uc's, lof, vb, uti s/s. No complaints.  Fundal Height:  24 Fetal Heart rate:  150 Edema:  none  Reviewed ptl s/s, fm All questions were answered Assessment: [redacted]w[redacted]d CHTN Medication(s) Plans:  Baby asa daily Treatment Plan:  Growth u/s @ 28, 34, 38wks     2x/wk testing nst/sono @ 32wks    Deliver @ 40wks (no meds), 39wks (meds) Follow up in 4wks for high-risk OB appt, pn2, growth u/s and f/u cardiac OFTs

## 2016-07-17 ENCOUNTER — Other Ambulatory Visit: Payer: Self-pay | Admitting: Women's Health

## 2016-07-17 DIAGNOSIS — O10919 Unspecified pre-existing hypertension complicating pregnancy, unspecified trimester: Secondary | ICD-10-CM

## 2016-07-17 DIAGNOSIS — Z0489 Encounter for examination and observation for other specified reasons: Secondary | ICD-10-CM

## 2016-07-17 DIAGNOSIS — IMO0002 Reserved for concepts with insufficient information to code with codable children: Secondary | ICD-10-CM

## 2016-07-18 ENCOUNTER — Ambulatory Visit (INDEPENDENT_AMBULATORY_CARE_PROVIDER_SITE_OTHER): Payer: Medicaid Other | Admitting: Obstetrics & Gynecology

## 2016-07-18 ENCOUNTER — Ambulatory Visit (INDEPENDENT_AMBULATORY_CARE_PROVIDER_SITE_OTHER): Payer: Medicaid Other

## 2016-07-18 ENCOUNTER — Other Ambulatory Visit: Payer: Medicaid Other

## 2016-07-18 ENCOUNTER — Encounter: Payer: Self-pay | Admitting: Obstetrics & Gynecology

## 2016-07-18 VITALS — BP 128/70 | HR 100 | Wt 221.4 lb

## 2016-07-18 DIAGNOSIS — O0990 Supervision of high risk pregnancy, unspecified, unspecified trimester: Secondary | ICD-10-CM

## 2016-07-18 DIAGNOSIS — Z362 Encounter for other antenatal screening follow-up: Secondary | ICD-10-CM

## 2016-07-18 DIAGNOSIS — Z3A28 28 weeks gestation of pregnancy: Secondary | ICD-10-CM

## 2016-07-18 DIAGNOSIS — O0993 Supervision of high risk pregnancy, unspecified, third trimester: Secondary | ICD-10-CM | POA: Diagnosis not present

## 2016-07-18 DIAGNOSIS — Z331 Pregnant state, incidental: Secondary | ICD-10-CM

## 2016-07-18 DIAGNOSIS — Z1389 Encounter for screening for other disorder: Secondary | ICD-10-CM

## 2016-07-18 DIAGNOSIS — O10913 Unspecified pre-existing hypertension complicating pregnancy, third trimester: Secondary | ICD-10-CM

## 2016-07-18 DIAGNOSIS — O10919 Unspecified pre-existing hypertension complicating pregnancy, unspecified trimester: Secondary | ICD-10-CM

## 2016-07-18 DIAGNOSIS — Z131 Encounter for screening for diabetes mellitus: Secondary | ICD-10-CM

## 2016-07-18 DIAGNOSIS — IMO0002 Reserved for concepts with insufficient information to code with codable children: Secondary | ICD-10-CM

## 2016-07-18 DIAGNOSIS — O133 Gestational [pregnancy-induced] hypertension without significant proteinuria, third trimester: Secondary | ICD-10-CM | POA: Diagnosis not present

## 2016-07-18 DIAGNOSIS — Z0489 Encounter for examination and observation for other specified reasons: Secondary | ICD-10-CM

## 2016-07-18 LAB — POCT URINALYSIS DIPSTICK
Blood, UA: NEGATIVE
GLUCOSE UA: 1
Ketones, UA: NEGATIVE
LEUKOCYTES UA: NEGATIVE
NITRITE UA: NEGATIVE
Protein, UA: NEGATIVE

## 2016-07-18 MED ORDER — OMEPRAZOLE 20 MG PO CPDR: 20.0000 mg | DELAYED_RELEASE_CAPSULE | Freq: Every day | ORAL | 6 refills | Status: DC

## 2016-07-18 NOTE — Progress Notes (Signed)
[place] [id number] WKS,cephalic,cx 4.3 cm,ant pl gr 0,afi 13 cm,normal ov's bilat,fhr 165 bpm,efw [id number] g,anatomy complete,no obvious abnormalities seen

## 2016-07-18 NOTE — Progress Notes (Signed)
G2P1001 [redacted]w[redacted]d Estimated Date of Delivery: 10/13/16  Blood pressure 128/70, pulse 100, weight 221 lb 6.4 oz (100.4 kg), last menstrual period 01/07/2016.   BP weight and urine results all reviewed and noted.  Please refer to the obstetrical flow sheet for the fundal height and fetal heart rate documentation:  Patient reports good fetal movement, denies any bleeding and no rupture of membranes symptoms or regular contractions. Patient is without complaints. All questions were answered.  Orders Placed This Encounter  Procedures  . POCT urinalysis dipstick    Plan:  Continued routine obstetrical care, PN2 today  Sonogram is normal specifically OFT  No Follow-up on file.

## 2016-07-18 NOTE — Addendum Note (Signed)
Addended by: Florian Buff on: 07/18/2016 10:41 AM   Modules accepted: Orders

## 2016-07-19 ENCOUNTER — Other Ambulatory Visit: Payer: Self-pay | Admitting: Women's Health

## 2016-07-19 ENCOUNTER — Encounter: Payer: Self-pay | Admitting: Women's Health

## 2016-07-19 ENCOUNTER — Telehealth: Payer: Self-pay | Admitting: Women's Health

## 2016-07-19 DIAGNOSIS — Z8632 Personal history of gestational diabetes: Secondary | ICD-10-CM | POA: Insufficient documentation

## 2016-07-19 DIAGNOSIS — O0990 Supervision of high risk pregnancy, unspecified, unspecified trimester: Secondary | ICD-10-CM

## 2016-07-19 DIAGNOSIS — O24410 Gestational diabetes mellitus in pregnancy, diet controlled: Secondary | ICD-10-CM

## 2016-07-19 LAB — RPR: RPR Ser Ql: NONREACTIVE

## 2016-07-19 LAB — CBC
HEMATOCRIT: 36.1 % (ref 34.0–46.6)
Hemoglobin: 12.2 g/dL (ref 11.1–15.9)
MCH: 30 pg (ref 26.6–33.0)
MCHC: 33.8 g/dL (ref 31.5–35.7)
MCV: 89 fL (ref 79–97)
PLATELETS: 282 10*3/uL (ref 150–379)
RBC: 4.07 x10E6/uL (ref 3.77–5.28)
RDW: 13.6 % (ref 12.3–15.4)
WBC: 10.3 10*3/uL (ref 3.4–10.8)

## 2016-07-19 LAB — GLUCOSE TOLERANCE, 2 HOURS W/ 1HR
Glucose, 1 hour: 227 mg/dL — ABNORMAL HIGH (ref 65–179)
Glucose, 2 hour: 164 mg/dL — ABNORMAL HIGH (ref 65–152)
Glucose, Fasting: 105 mg/dL — ABNORMAL HIGH (ref 65–91)

## 2016-07-19 LAB — HIV ANTIBODY (ROUTINE TESTING W REFLEX): HIV SCREEN 4TH GENERATION: NONREACTIVE

## 2016-07-19 LAB — ANTIBODY SCREEN: Antibody Screen: NEGATIVE

## 2016-07-19 NOTE — Telephone Encounter (Signed)
Patient was called and informed she has gestational diabetes. I explained to her that the dietician should call her in the next week and if she did not hear from them to give [place] a call back. Pt verbalized understanding.

## 2016-07-22 NOTE — L&D Delivery Note (Signed)
Delivery Note At  a viable female was delivered via  (Presentation:vertex ;loa  ).  APGAR8: ,9 ; weight  .   Placenta status spont:shultz , .  Cord:3vc  with the following complications:none .  Cord pH: n/a  Anesthesia: epidural  Episiotomy:  none Lacerations:  none Suture Repair: n/a Est. Blood Loss 150 (mL):    Mom to postpartum.  Baby to Couplet care / Skin to Skin.  Koren Shiver 10/06/2016, 6:38 PM

## 2016-07-25 ENCOUNTER — Ambulatory Visit (INDEPENDENT_AMBULATORY_CARE_PROVIDER_SITE_OTHER): Payer: Medicaid Other | Admitting: Obstetrics and Gynecology

## 2016-07-25 ENCOUNTER — Encounter: Payer: Self-pay | Admitting: Obstetrics and Gynecology

## 2016-07-25 VITALS — BP 137/75 | HR 96 | Wt 223.0 lb

## 2016-07-25 DIAGNOSIS — O133 Gestational [pregnancy-induced] hypertension without significant proteinuria, third trimester: Secondary | ICD-10-CM

## 2016-07-25 DIAGNOSIS — Z3A30 30 weeks gestation of pregnancy: Secondary | ICD-10-CM | POA: Diagnosis not present

## 2016-07-25 DIAGNOSIS — Z3493 Encounter for supervision of normal pregnancy, unspecified, third trimester: Secondary | ICD-10-CM

## 2016-07-25 DIAGNOSIS — H9201 Otalgia, right ear: Secondary | ICD-10-CM

## 2016-07-25 DIAGNOSIS — O0993 Supervision of high risk pregnancy, unspecified, third trimester: Secondary | ICD-10-CM | POA: Diagnosis not present

## 2016-08-01 NOTE — Progress Notes (Signed)
BQ:[id number]  Estimated Date of Delivery: 10/13/16 [facility] [redacted]w[redacted]d  Chief Complaint  Patient presents with  . w/i    possible ear infection-rght ear pain  ____  Patient complaints: rIGHT EAR PAIN. Patient reports   good fetal movement,                           denies any bleeding , rupture of membranes,or regular contractions.  Blood pressure 137/75, pulse 96, weight 223 lb (101.2 kg), last menstrual period 01/07/2016.   Urine results:notable for NEG PROT refer to the ob flow sheet for FH and FHR, ,                          Physical Examination: General appearance - alert, well appearing, and in no distress                                      Abdomen - FH 30 ,                                                         -FHR 145                                                         soft, nontender, nondistended, no masses or organomegaly                                      Heent: Perrl eomi                                      Ears :left filled with wax                                               Right: normal tm, no EAC pain                                            Questions were answered. Assessment: LROB G2P1001 @ [redacted]w[redacted]d normal ear exam   Plan:  Continued routine obstetrical care,   F/u in as sched weeks for lrob

## 2016-08-09 ENCOUNTER — Encounter: Payer: Self-pay | Admitting: Obstetrics & Gynecology

## 2016-08-09 ENCOUNTER — Ambulatory Visit (INDEPENDENT_AMBULATORY_CARE_PROVIDER_SITE_OTHER): Payer: Medicaid Other | Admitting: Obstetrics & Gynecology

## 2016-08-09 VITALS — BP 114/68 | HR 89 | Wt 225.0 lb

## 2016-08-09 DIAGNOSIS — O9A213 Injury, poisoning and certain other consequences of external causes complicating pregnancy, third trimester: Secondary | ICD-10-CM

## 2016-08-09 DIAGNOSIS — O0990 Supervision of high risk pregnancy, unspecified, unspecified trimester: Secondary | ICD-10-CM

## 2016-08-09 DIAGNOSIS — O99333 Smoking (tobacco) complicating pregnancy, third trimester: Secondary | ICD-10-CM | POA: Diagnosis not present

## 2016-08-09 DIAGNOSIS — W000XXA Fall on same level due to ice and snow, initial encounter: Secondary | ICD-10-CM | POA: Diagnosis not present

## 2016-08-09 DIAGNOSIS — Z1389 Encounter for screening for other disorder: Secondary | ICD-10-CM | POA: Diagnosis not present

## 2016-08-09 DIAGNOSIS — Z3A32 32 weeks gestation of pregnancy: Secondary | ICD-10-CM | POA: Diagnosis not present

## 2016-08-09 DIAGNOSIS — O99213 Obesity complicating pregnancy, third trimester: Secondary | ICD-10-CM

## 2016-08-09 DIAGNOSIS — O10919 Unspecified pre-existing hypertension complicating pregnancy, unspecified trimester: Secondary | ICD-10-CM

## 2016-08-09 DIAGNOSIS — Z331 Pregnant state, incidental: Secondary | ICD-10-CM | POA: Diagnosis not present

## 2016-08-09 DIAGNOSIS — W19XXXA Unspecified fall, initial encounter: Secondary | ICD-10-CM

## 2016-08-09 LAB — POCT URINALYSIS DIPSTICK
KETONES UA: NEGATIVE
Leukocytes, UA: NEGATIVE
NITRITE UA: NEGATIVE
Protein, UA: NEGATIVE
RBC UA: NEGATIVE

## 2016-08-09 NOTE — Progress Notes (Signed)
Fetal Surveillance Testing today:  Reactive NST   High Risk Pregnancy Diagnosis(es):   Fell on butt in snow 48 hours ago, no complaints  G2P1001 [redacted]w[redacted]d Estimated Date of Delivery: 10/13/16  Blood pressure 114/68, pulse 89, weight 225 lb (102.1 kg), last menstrual period 01/07/2016.  Urinalysis: Negative   HPI: The patient is being seen today for ongoing management of as above. Today she reports no bleeding abdominal pain or LOF   BP weight and urine results all reviewed and noted. Patient reports good fetal movement, denies any bleeding and no rupture of membranes symptoms or regular contractions.  Fundal Height:  30 Fetal Heart rate:  140 Edema:  none  Patient is without complaints other than noted in her HPI. All questions were answered.  All lab and sonogram results have been reviewed. Comments:    Assessment:  1.  Pregnancy at [redacted]w[redacted]d,  Estimated Date of Delivery: 10/13/16 :                          2.  Fall 48 hours ago no sequelae                        3.    Medication(s) Plans:  None   Treatment Plan:  Reactive NST with no contractions  Return in about 2 weeks (around 08/23/2016) for HROB. for appointment for high risk OB care  No orders of the defined types were placed in this encounter.  Orders Placed This Encounter  Procedures  . POCT urinalysis dipstick

## 2016-08-20 ENCOUNTER — Ambulatory Visit (INDEPENDENT_AMBULATORY_CARE_PROVIDER_SITE_OTHER): Payer: Medicaid Other | Admitting: Advanced Practice Midwife

## 2016-08-20 ENCOUNTER — Encounter: Payer: Self-pay | Admitting: Advanced Practice Midwife

## 2016-08-20 VITALS — BP 126/78 | Wt 223.0 lb

## 2016-08-20 DIAGNOSIS — O0993 Supervision of high risk pregnancy, unspecified, third trimester: Secondary | ICD-10-CM

## 2016-08-20 DIAGNOSIS — Z1389 Encounter for screening for other disorder: Secondary | ICD-10-CM

## 2016-08-20 DIAGNOSIS — Z3A32 32 weeks gestation of pregnancy: Secondary | ICD-10-CM | POA: Diagnosis not present

## 2016-08-20 DIAGNOSIS — O24410 Gestational diabetes mellitus in pregnancy, diet controlled: Secondary | ICD-10-CM

## 2016-08-20 DIAGNOSIS — O10919 Unspecified pre-existing hypertension complicating pregnancy, unspecified trimester: Secondary | ICD-10-CM

## 2016-08-20 DIAGNOSIS — Z331 Pregnant state, incidental: Secondary | ICD-10-CM | POA: Diagnosis not present

## 2016-08-20 DIAGNOSIS — O10913 Unspecified pre-existing hypertension complicating pregnancy, third trimester: Secondary | ICD-10-CM | POA: Diagnosis not present

## 2016-08-20 LAB — POCT URINALYSIS DIPSTICK
Blood, UA: NEGATIVE
Glucose, UA: NEGATIVE
LEUKOCYTES UA: NEGATIVE
NITRITE UA: NEGATIVE
Protein, UA: NEGATIVE

## 2016-08-20 NOTE — Progress Notes (Signed)
Fetal Surveillance Testing today:  doppler   High Risk Pregnancy Diagnosis(es):   CHTN (no meds) A1DM  G2P1001 [redacted]w[redacted]d Estimated Date of Delivery: 10/13/16  Blood pressure 126/78, weight 223 lb (101.2 kg), last menstrual period 01/07/2016.  Urinalysis: Negative   HPI: The patient is being seen today for ongoing management of the above. Today she reports her nutrition class is on Thursday.    BP weight and urine results all reviewed and noted. Patient reports good fetal movement, denies any bleeding and no rupture of membranes symptoms or regular contractions.  Fundal Height:  32 Fetal Heart rate:  140 Edema:  no  Patient is without complaints other than noted in her HPI. All questions were answered.  All lab and sonogram results have been reviewed. Comments:    Assessment:  1.  Pregnancy at [redacted]w[redacted]d,  Estimated Date of Delivery: 10/13/16 :                          2.  CHTN, no meds                        3.  A1Dm unknown control  Medication(s) Plans:  Asa 81 mg  Treatment Plan:  ]  Growth u/s 34, 38wks     2x/wk testing nst  Return in about 6 days (around 08/26/2016) for HROB, NST. for appointment for high risk OB care  No orders of the defined types were placed in this encounter.  Orders Placed This Encounter  Procedures  . POCT urinalysis dipstick

## 2016-08-20 NOTE — Patient Instructions (Signed)
Third Trimester of Pregnancy The third trimester is from week 29 through week 40 (months 7 through 9). The third trimester is a time when the unborn baby (fetus) is growing rapidly. At the end of the ninth month, the fetus is about 20 inches in length and weighs 6-10 pounds. Body changes during your third trimester Your body goes through many changes during pregnancy. The changes vary from woman to woman. During the third trimester:  Your weight will continue to increase. You can expect to gain 25-35 pounds (11-16 kg) by the end of the pregnancy.  You may begin to get stretch marks on your hips, abdomen, and breasts.  You may urinate more often because the fetus is moving lower into your pelvis and pressing on your bladder.  You may develop or continue to have heartburn. This is caused by increased hormones that slow down muscles in the digestive tract.  You may develop or continue to have constipation because increased hormones slow digestion and cause the muscles that push waste through your intestines to relax.  You may develop hemorrhoids. These are swollen veins (varicose veins) in the rectum that can itch or be painful.  You may develop swollen, bulging veins (varicose veins) in your legs.  You may have increased body aches in the pelvis, back, or thighs. This is due to weight gain and increased hormones that are relaxing your joints.  You may have changes in your hair. These can include thickening of your hair, rapid growth, and changes in texture. Some women also have hair loss during or after pregnancy, or hair that feels dry or thin. Your hair will most likely return to normal after your baby is born.  Your breasts will continue to grow and they will continue to become tender. A yellow fluid (colostrum) may leak from your breasts. This is the first milk you are producing for your baby.  Your belly button may stick out.  You may notice more swelling in your hands, face, or  ankles.  You may have increased tingling or numbness in your hands, arms, and legs. The skin on your belly may also feel numb.  You may feel short of breath because of your expanding uterus.  You may have more problems sleeping. This can be caused by the size of your belly, increased need to urinate, and an increase in your body's metabolism.  You may notice the fetus "dropping," or moving lower in your abdomen.  You may have increased vaginal discharge.  Your cervix becomes thin and soft (effaced) near your due date. What to expect at prenatal visits You will have prenatal exams every 2 weeks until week 36. Then you will have weekly prenatal exams. During a routine prenatal visit:  You will be weighed to make sure you and the fetus are growing normally.  Your blood pressure will be taken.  Your abdomen will be measured to track your baby's growth.  The fetal heartbeat will be listened to.  Any test results from the previous visit will be discussed.  You may have a cervical check near your due date to see if you have effaced. At around 36 weeks, your health care provider will check your cervix. At the same time, your health care provider will also perform a test on the secretions of the vaginal tissue. This test is to determine if a type of bacteria, Group B streptococcus, is present. Your health care provider will explain this further. Your health care provider may ask you:    What your birth plan is.  How you are feeling.  If you are feeling the baby move.  If you have had any abnormal symptoms, such as leaking fluid, bleeding, severe headaches, or abdominal cramping.  If you are using any tobacco products, including cigarettes, chewing tobacco, and electronic cigarettes.  If you have any questions. Other tests or screenings that may be performed during your third trimester include:  Blood tests that check for low iron levels (anemia).  Fetal testing to check the health,  activity level, and growth of the fetus. Testing is done if you have certain medical conditions or if there are problems during the pregnancy.  Nonstress test (NST). This test checks the health of your baby to make sure there are no signs of problems, such as the baby not getting enough oxygen. During this test, a belt is placed around your belly. The baby is made to move, and its heart rate is monitored during movement. What is false labor? False labor is a condition in which you feel small, irregular tightenings of the muscles in the womb (contractions) that eventually go away. These are called Braxton Hicks contractions. Contractions may last for hours, days, or even weeks before true labor sets in. If contractions come at regular intervals, become more frequent, increase in intensity, or become painful, you should see your health care provider. What are the signs of labor?  Abdominal cramps.  Regular contractions that start at 10 minutes apart and become stronger and more frequent with time.  Contractions that start on the top of the uterus and spread down to the lower abdomen and back.  Increased pelvic pressure and dull back pain.  A watery or bloody mucus discharge that comes from the vagina.  Leaking of amniotic fluid. This is also known as your "water breaking." It could be a slow trickle or a gush. Let your doctor know if it has a color or strange odor. If you have any of these signs, call your health care provider right away, even if it is before your due date. Follow these instructions at home: Eating and drinking  Continue to eat regular, healthy meals.  Do not eat:  Raw meat or meat spreads.  Unpasteurized milk or cheese.  Unpasteurized juice.  Store-made salad.  Refrigerated smoked seafood.  Hot dogs or deli meat, unless they are piping hot.  More than 6 ounces of albacore tuna a week.  Shark, swordfish, king mackerel, or tile fish.  Store-made salads.  Raw  sprouts, such as mung bean or alfalfa sprouts.  Take prenatal vitamins as told by your health care provider.  Take 1000 mg of calcium daily as told by your health care provider.  If you develop constipation:  Take over-the-counter or prescription medicines.  Drink enough fluid to keep your urine clear or pale yellow.  Eat foods that are high in fiber, such as fresh fruits and vegetables, whole grains, and beans.  Limit foods that are high in fat and processed sugars, such as fried and sweet foods. Activity  Exercise only as directed by your health care provider. Healthy pregnant women should aim for 2 hours and 30 minutes of moderate exercise per week. If you experience any pain or discomfort while exercising, stop.  Avoid heavy lifting.  Do not exercise in extreme heat or humidity, or at high altitudes.  Wear low-heel, comfortable shoes.  Practice good posture.  Do not travel far distances unless it is absolutely necessary and only with the approval   of your health care provider.  Wear your seat belt at all times while in a car, on a bus, or on a plane.  Take frequent breaks and rest with your legs elevated if you have leg cramps or low back pain.  Do not use hot tubs, steam rooms, or saunas.  You may continue to have sex unless your health care provider tells you otherwise. Lifestyle  Do not use any products that contain nicotine or tobacco, such as cigarettes and e-cigarettes. If you need help quitting, ask your health care provider.  Do not drink alcohol.  Do not use any medicinal herbs or unprescribed drugs. These chemicals affect the formation and growth of the baby.  If you develop varicose veins:  Wear support pantyhose or compression stockings as told by your healthcare provider.  Elevate your feet for 15 minutes, 3-4 times a day.  Wear a supportive maternity bra to help with breast tenderness. General instructions  Take over-the-counter and prescription  medicines only as told by your health care provider. There are medicines that are either safe or unsafe to take during pregnancy.  Take warm sitz baths to soothe any pain or discomfort caused by hemorrhoids. Use hemorrhoid cream or witch hazel if your health care provider approves.  Avoid cat litter boxes and soil used by cats. These carry germs that can cause birth defects in the baby. If you have a cat, ask someone to clean the litter box for you.  To prepare for the arrival of your baby:  Take prenatal classes to understand, practice, and ask questions about the labor and delivery.  Make a trial run to the hospital.  Visit the hospital and tour the maternity area.  Arrange for maternity or paternity leave through employers.  Arrange for family and friends to take care of pets while you are in the hospital.  Purchase a rear-facing car seat and make sure you know how to install it in your car.  Pack your hospital bag.  Prepare the baby's nursery. Make sure to remove all pillows and stuffed animals from the baby's crib to prevent suffocation.  Visit your dentist if you have not gone during your pregnancy. Use a soft toothbrush to brush your teeth and be gentle when you floss.  Keep all prenatal follow-up visits as told by your health care provider. This is important. Contact a health care provider if:  You are unsure if you are in labor or if your water has broken.  You become dizzy.  You have mild pelvic cramps, pelvic pressure, or nagging pain in your abdominal area.  You have lower back pain.  You have persistent nausea, vomiting, or diarrhea.  You have an unusual or bad smelling vaginal discharge.  You have pain when you urinate. Get help right away if:  You have a fever.  You are leaking fluid from your vagina.  You have spotting or bleeding from your vagina.  You have severe abdominal pain or cramping.  You have rapid weight loss or weight gain.  You have  shortness of breath with chest pain.  You notice sudden or extreme swelling of your face, hands, ankles, feet, or legs.  Your baby makes fewer than 10 movements in 2 hours.  You have severe headaches that do not go away with medicine.  You have vision changes. Summary  The third trimester is from week 29 through week 40, months 7 through 9. The third trimester is a time when the unborn baby (fetus)   is growing rapidly.  During the third trimester, your discomfort may increase as you and your baby continue to gain weight. You may have abdominal, leg, and back pain, sleeping problems, and an increased need to urinate.  During the third trimester your breasts will keep growing and they will continue to become tender. A yellow fluid (colostrum) may leak from your breasts. This is the first milk you are producing for your baby.  False labor is a condition in which you feel small, irregular tightenings of the muscles in the womb (contractions) that eventually go away. These are called Braxton Hicks contractions. Contractions may last for hours, days, or even weeks before true labor sets in.  Signs of labor can include: abdominal cramps; regular contractions that start at 10 minutes apart and become stronger and more frequent with time; watery or bloody mucus discharge that comes from the vagina; increased pelvic pressure and dull back pain; and leaking of amniotic fluid. This information is not intended to replace advice given to you by your health care provider. Make sure you discuss any questions you have with your health care provider. Document Released: 07/02/2001 Document Revised: 12/14/2015 Document Reviewed: 09/08/2012 Elsevier Interactive Patient Education  2017 [employer]  

## 2016-08-22 ENCOUNTER — Telehealth: Payer: Self-pay | Admitting: Obstetrics & Gynecology

## 2016-08-22 ENCOUNTER — Encounter: Payer: Medicaid Other | Attending: Advanced Practice Midwife | Admitting: Nutrition

## 2016-08-22 ENCOUNTER — Encounter: Payer: Medicaid Other | Admitting: Women's Health

## 2016-08-22 ENCOUNTER — Encounter: Payer: Self-pay | Admitting: Nutrition

## 2016-08-22 VITALS — Ht 64.0 in | Wt 225.0 lb

## 2016-08-22 DIAGNOSIS — O24410 Gestational diabetes mellitus in pregnancy, diet controlled: Secondary | ICD-10-CM | POA: Insufficient documentation

## 2016-08-22 DIAGNOSIS — Z3A00 Weeks of gestation of pregnancy not specified: Secondary | ICD-10-CM | POA: Insufficient documentation

## 2016-08-22 DIAGNOSIS — Z713 Dietary counseling and surveillance: Secondary | ICD-10-CM | POA: Diagnosis not present

## 2016-08-22 NOTE — Progress Notes (Signed)
Medical Nutrition Therapy:  Appt start time: 0830 end time:  0930.   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 [redacted] weeks gestation. [facility]  10/13/2016.  Tested at work sometimes 2 hours after meal and occassionally. Typically BS 2 hours after meals 120-150's. Had a 191 mg/dl last night 2 hours supper. Works 7 p -7 pm 3 days per week. Eats 2 meals per day. 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 She notes she won't be able to test her blood sugars as requested due to her job schedule nor eat meals on time as discussed. She is not exercising. Doesn't seem to be engaged to make the changes needed to control blood sugars during her pregnancy.  Diet is inconsistent to meet her needs and control her blood sugars. 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 Due to limited insurance costs, suggested she try buying and using [employer] brand for cheapest cost of strips testing 4 or more times per day.       Lab Results  Component Value Date   HGBA1C 5.7 (H) 05/10/2014     Preferred Learning Style:    No preference indicated   Learning Readiness:  Ready  Change in progress   MEDICATIONS:   DIETARY INTAKE:   24-hr recall:  B ( 5 pm): CHicken, baked mac/cheese and ice cream, Diet Mt Dew Snk ( AM):  Triscuits, crackers or grapes L (  1 am PM):  Hot dog with bun, triscuits, yogurt, Diet Mt dew Snk ( PM): none D ( PM): skips  Snk ( PM):  Beverages:   Usual physical activity:   Estimated energy needs: 1800-200 calories 200 g carbohydrates 135 g protein 50 g fat  Progress Towards Goal(s):  In progress.   Nutritional Diagnosis:  NB-1.1 Food and nutrition-related knowledge deficit As related to Gestational DM.  As evidenced by FBS> 95 and 2 hour post prandial > 120 and A1C 5.7%..    Intervention:  Nutrition and  Gestational Diabetes education provided on My Plate,  CHO counting, meal planning, portion sizes, timing of meals, avoiding snacks between meals unless having a low blood sugar, target ranges for A1C and blood sugars, signs/symptoms and treatment of hyper/hypoglycemia, monitoring blood sugars, taking medications as prescribed, benefits of exercising 30 minutes per day and prevention of complications of DM. Reviewed Gestational DM packet with her and dangers of elevated blood sugars and complication risk to mother and fetus. Encouraged compliance with diet and testing   Blood Glucose Monitoring Instruction Medications: None right now     Intervention:    Explained rationale of testing BG to obtain data as to how their diabetes is being managed.  Provided Target Ranges for both pre and post meals  Explained factors that effect BG including food (carbohydrate), stress, activity level and insulin availability in the body including diabetes medications  Taught patient techniques for using BG monitor and lancing device  Discussed need for Rx for strips and lancets   Explained rationale of recording BG both for patient and MD to assess patterns as needed.  Follow Up: Patient offered follow up as needed. .Goals . Eat 3 small meals and 3 small snacks as discussed in the Gestational Diabetes Packet Increase water intake and cut out all diet sodas Test blood sugars before breakfast and 2 hours after each meal Exercise 60 minutes  3 times per week Keep FBS Less than 95 and 2 hr pp less than 120 mg/dl. Increase protein, fresh fruits and vegetables and avoid processed foods Follow up with OBGYN about elevated blood sugars  Teaching Method Utilized:  Visual Auditory Hands on  Handouts given during visit include:  Gestational DM Packet  My Plate   Barriers to learning/adherence to lifestyle change: none  Demonstrated degree of understanding via:  Teach Back   Monitoring/Evaluation:  Dietary intake, exercise, meal planning, SBG, and body  weight prn. She reports she didn't need follow up appt.

## 2016-08-22 NOTE — Telephone Encounter (Signed)
Spoke with pt. Pt has the Accuchek Guide and checks sugar QID. Pt needs lancets and strips called to pharmacy. I called both in with PRN refills. Pt aware. [place]

## 2016-08-23 ENCOUNTER — Encounter: Payer: Medicaid Other | Admitting: Obstetrics & Gynecology

## 2016-08-26 ENCOUNTER — Other Ambulatory Visit: Payer: Self-pay | Admitting: Women's Health

## 2016-08-26 ENCOUNTER — Encounter: Payer: Self-pay | Admitting: Women's Health

## 2016-08-26 ENCOUNTER — Other Ambulatory Visit (INDEPENDENT_AMBULATORY_CARE_PROVIDER_SITE_OTHER): Payer: Medicaid Other

## 2016-08-26 ENCOUNTER — Ambulatory Visit (INDEPENDENT_AMBULATORY_CARE_PROVIDER_SITE_OTHER): Payer: Medicaid Other | Admitting: Women's Health

## 2016-08-26 VITALS — BP 144/82 | HR 76 | Wt 224.0 lb

## 2016-08-26 DIAGNOSIS — Z1389 Encounter for screening for other disorder: Secondary | ICD-10-CM

## 2016-08-26 DIAGNOSIS — Z3A33 33 weeks gestation of pregnancy: Secondary | ICD-10-CM

## 2016-08-26 DIAGNOSIS — O10913 Unspecified pre-existing hypertension complicating pregnancy, third trimester: Secondary | ICD-10-CM

## 2016-08-26 DIAGNOSIS — O288 Other abnormal findings on antenatal screening of mother: Secondary | ICD-10-CM

## 2016-08-26 DIAGNOSIS — O0990 Supervision of high risk pregnancy, unspecified, unspecified trimester: Secondary | ICD-10-CM

## 2016-08-26 DIAGNOSIS — O10919 Unspecified pre-existing hypertension complicating pregnancy, unspecified trimester: Secondary | ICD-10-CM

## 2016-08-26 DIAGNOSIS — Z331 Pregnant state, incidental: Secondary | ICD-10-CM | POA: Diagnosis not present

## 2016-08-26 DIAGNOSIS — O24419 Gestational diabetes mellitus in pregnancy, unspecified control: Secondary | ICD-10-CM

## 2016-08-26 DIAGNOSIS — O0993 Supervision of high risk pregnancy, unspecified, third trimester: Secondary | ICD-10-CM | POA: Diagnosis not present

## 2016-08-26 LAB — POCT URINALYSIS DIPSTICK
Blood, UA: NEGATIVE
GLUCOSE UA: NEGATIVE
Ketones, UA: NEGATIVE
Leukocytes, UA: NEGATIVE
Nitrite, UA: NEGATIVE
PROTEIN UA: NEGATIVE

## 2016-08-26 MED ORDER — METFORMIN HCL 500 MG PO TABS: 500.0000 mg | ORAL_TABLET | Freq: Two times a day (BID) | ORAL | 3 refills | Status: DC

## 2016-08-26 NOTE — Patient Instructions (Signed)
Call the office ([phone]) or go to [facility] if:  You begin to have strong, frequent contractions  Your water breaks.  Sometimes it is a big gush of fluid, sometimes it is just a trickle that keeps getting your panties wet or running down your legs  You have vaginal bleeding.  It is normal to have a small amount of spotting if your cervix was checked.   You don't feel your baby moving like normal.  If you don't, get you something to eat and drink and lay down and focus on feeling your baby move.  You should feel at least 10 movements in 2 hours.  If you don't, you should call the office or go to [facility].     Preterm Labor and Birth Information The normal length of a pregnancy is 39-41 weeks. Preterm labor is when labor starts before 37 completed weeks of pregnancy. What are the risk factors for preterm labor? Preterm labor is more likely to occur in women who:  Have certain infections during pregnancy such as a bladder infection, sexually transmitted infection, or infection inside the uterus (chorioamnionitis).  Have a shorter-than-normal cervix.  Have gone into preterm labor before.  Have had surgery on their cervix.  Are younger than age 17 or older than age 35.  Are African American.  Are pregnant with twins or multiple babies (multiple gestation).  Take street drugs or smoke while pregnant.  Do not gain enough weight while pregnant.  Became pregnant shortly after having been pregnant. What are the symptoms of preterm labor? Symptoms of preterm labor include:  Cramps similar to those that can happen during a menstrual period. The cramps may happen with diarrhea.  Pain in the abdomen or lower back.  Regular uterine contractions that may feel like tightening of the abdomen.  A feeling of increased pressure in the pelvis.  Increased watery or bloody mucus discharge from the vagina.  Water breaking (ruptured amniotic sac). Why is it important to  recognize signs of preterm labor? It is important to recognize signs of preterm labor because babies who are born prematurely may not be fully developed. This can put them at an increased risk for:  Long-term (chronic) heart and lung problems.  Difficulty immediately after birth with regulating body systems, including blood sugar, body temperature, heart rate, and breathing rate.  Bleeding in the brain.  Cerebral palsy.  Learning difficulties.  Death. These risks are highest for babies who are born before 34 weeks of pregnancy. How is preterm labor treated? Treatment depends on the length of your pregnancy, your condition, and the health of your baby. It may involve:  Having a stitch (suture) placed in your cervix to prevent your cervix from opening too early (cerclage).  Taking or being given medicines, such as:  Hormone medicines. These may be given early in pregnancy to help support the pregnancy.  Medicine to stop contractions.  Medicines to help mature the baby's lungs. These may be prescribed if the risk of delivery is high.  Medicines to prevent your baby from developing cerebral palsy. If the labor happens before 34 weeks of pregnancy, you may need to stay in the hospital. What should I do if I think I am in preterm labor? If you think that you are going into preterm labor, call your health care provider right away. How can I prevent preterm labor in future pregnancies? To increase your chance of having a full-term pregnancy:  Do not use any tobacco products, such   as cigarettes, chewing tobacco, and e-cigarettes. If you need help quitting, ask your health care provider.  Do not use street drugs or medicines that have not been prescribed to you during your pregnancy.  Talk with your health care provider before taking any herbal supplements, even if you have been taking them regularly.  Make sure you gain a healthy amount of weight during your pregnancy.  Watch for  infection. If you think that you might have an infection, get it checked right away.  Make sure to tell your health care provider if you have gone into preterm labor before. This information is not intended to replace advice given to you by your health care provider. Make sure you discuss any questions you have with your health care provider. Document Released: 09/28/2003 Document Revised: 12/19/2015 Document Reviewed: 11/29/2015 Elsevier Interactive Patient Education  2017 Elsevier Inc.  

## 2016-08-26 NOTE — Progress Notes (Signed)
High Risk Pregnancy Diagnosis(es): CHTN- no meds, A2DM (added meds today) G2P1001 [redacted]w[redacted]d Estimated Date of Delivery: 10/13/16 BP 130/82   Pulse 76   Wt 224 lb (101.6 kg)   LMP 01/07/2016 (Exact Date)   BMI 38.45 kg/m   Urinalysis: Negative HPI:  Doing well, Denies ha, visual changes, ruq/epigastric pain, n/v.   FBS 80-115 (2 >90), 2hr pp 88-163 (6 >120), discussed starting meds- pt prefers metformin BP, weight, and urine reviewed.  Reports good fm. Denies regular uc's, lof, vb, uti s/s. No complaints.  Fundal Height:  35 Fetal Heart rate:  155, minimal variability to begin w/ (was semi-fowler's, turned to Rt lateral) variability improved to moderate, some 10x10s, but no 15x15s, pt does report feeling fm while on efm-so worked in for BPP which was 8/8 Edema:  none  Reviewed ptl s/s, fkc All questions were answered Assessment: [redacted]w[redacted]d CHTN- no meds, A2DM (added meds today) Medication(s) Plans:  Start metformin 500mg  BID, continue baby asa daily Treatment Plan:  2x/wk testing nst/sono, IOL @ 39wks Follow up in 3d for high-risk OB appt and efw/bpp/dopp u/s

## 2016-08-26 NOTE — Progress Notes (Signed)
[place] [id number] wks,cephalic,fhr [id number] bpm,BPP [id number] pl gr 2,bilat adnexa's wnl,afi 8.2 cm

## 2016-08-28 NOTE — Patient Instructions (Addendum)
Goals . Eat 3 small meals and 3 small snacks as discussed in the Gestational Diabetes Packet Increase water intake and cut out all diet sodas Test blood sugars before breakfast and 2 hours after each meal Exercise 60 minutes 3 times per week Keep FBS Less than 95 and 2 hr pp less than 120 mg/dl. Increase protein, fresh fruits and vegetables and avoid processed foods Follow up with OBGYN about elevated blood sugars

## 2016-08-29 ENCOUNTER — Encounter: Payer: Self-pay | Admitting: Advanced Practice Midwife

## 2016-08-29 ENCOUNTER — Ambulatory Visit (INDEPENDENT_AMBULATORY_CARE_PROVIDER_SITE_OTHER): Payer: Medicaid Other | Admitting: Advanced Practice Midwife

## 2016-08-29 ENCOUNTER — Other Ambulatory Visit: Payer: Medicaid Other

## 2016-08-29 ENCOUNTER — Ambulatory Visit (INDEPENDENT_AMBULATORY_CARE_PROVIDER_SITE_OTHER): Payer: Medicaid Other

## 2016-08-29 VITALS — BP 120/80 | HR 82 | Wt 229.0 lb

## 2016-08-29 DIAGNOSIS — O10919 Unspecified pre-existing hypertension complicating pregnancy, unspecified trimester: Secondary | ICD-10-CM

## 2016-08-29 DIAGNOSIS — O10913 Unspecified pre-existing hypertension complicating pregnancy, third trimester: Secondary | ICD-10-CM

## 2016-08-29 DIAGNOSIS — O24419 Gestational diabetes mellitus in pregnancy, unspecified control: Secondary | ICD-10-CM | POA: Diagnosis not present

## 2016-08-29 DIAGNOSIS — Z1389 Encounter for screening for other disorder: Secondary | ICD-10-CM

## 2016-08-29 DIAGNOSIS — Z3A34 34 weeks gestation of pregnancy: Secondary | ICD-10-CM

## 2016-08-29 DIAGNOSIS — Z331 Pregnant state, incidental: Secondary | ICD-10-CM | POA: Diagnosis not present

## 2016-08-29 DIAGNOSIS — N898 Other specified noninflammatory disorders of vagina: Secondary | ICD-10-CM

## 2016-08-29 DIAGNOSIS — O0993 Supervision of high risk pregnancy, unspecified, third trimester: Secondary | ICD-10-CM

## 2016-08-29 DIAGNOSIS — O0990 Supervision of high risk pregnancy, unspecified, unspecified trimester: Secondary | ICD-10-CM

## 2016-08-29 LAB — POCT URINALYSIS DIPSTICK
GLUCOSE UA: NEGATIVE
Ketones, UA: NEGATIVE
Leukocytes, UA: NEGATIVE
Nitrite, UA: NEGATIVE
Protein, UA: NEGATIVE
RBC UA: NEGATIVE

## 2016-08-29 NOTE — Progress Notes (Signed)
[place] [id number] wks,cephalic,BPP [id number] pl gr 2,afi 8.3 cm,RI .59,.55,FHR 152 bpm,EFW 2342 g 54%

## 2016-08-29 NOTE — Progress Notes (Signed)
Fetal Surveillance Testing today:  US/BPP/Dopplers/EFW   High Risk Pregnancy Diagnosis(es):   CHTN (no meds) and A2DM  G2P1001 [redacted]w[redacted]d Estimated Date of Delivery: 10/13/16  Blood pressure 120/80, pulse 82, weight 229 lb (103.9 kg), last menstrual period 01/07/2016.  Urinalysis: Negative   HPI: The patient is being seen today for ongoing management of the above . Today she reports FBS 80, 80  And 119 this am and 2 hr pp ? 147.?  Didn't bring book.    BP weight and urine results all reviewed and noted. Patient reports good fetal movement, denies any bleeding and no rupture of membranes symptoms or regular contractions.  Had some "burning in my cervix" yesterday and today. Groin painted w/gentian violet Monday.    SSE;; normal appearoing DC.  Wet prep neg. Cx 1/thick/ballottable   Edema:  no  Patient is without complaints other than noted in her HPI. All questions were answered.  All lab and sonogram results have been reviewed. Comments: normal  [place] [id number] wks,cephalic,BPP [id number] pl gr 2,afi 8.3 cm,RI .59,.55,FHR 152 bpm,EFW 2342 g 54% Assessment:  1.  Pregnancy at [redacted]w[redacted]d,  Estimated Date of Delivery: 10/13/16 :                          2.  CHTN, no meds                        3.  A2DM, ? control on meds  Medication(s) Plans:  metformin 500mg  BID, continue baby asa daily  Treatment Plan:   2x/wk testing nst/sono, IOL @ 39wks  Return for mondays for NST/HROB and Thursdays for US/HROB. for appointment for high risk OB care  BRING BS LOG!!  No orders of the defined types were placed in this encounter.  Orders Placed This Encounter  Procedures  . POCT urinalysis dipstick

## 2016-09-02 ENCOUNTER — Ambulatory Visit (INDEPENDENT_AMBULATORY_CARE_PROVIDER_SITE_OTHER): Payer: Medicaid Other | Admitting: Obstetrics & Gynecology

## 2016-09-02 ENCOUNTER — Encounter: Payer: Self-pay | Admitting: Obstetrics & Gynecology

## 2016-09-02 ENCOUNTER — Ambulatory Visit: Payer: BLUE CROSS/BLUE SHIELD | Admitting: Nutrition

## 2016-09-02 VITALS — BP 120/80 | HR 78 | Wt 227.0 lb

## 2016-09-02 DIAGNOSIS — Z331 Pregnant state, incidental: Secondary | ICD-10-CM

## 2016-09-02 DIAGNOSIS — O0993 Supervision of high risk pregnancy, unspecified, third trimester: Secondary | ICD-10-CM | POA: Diagnosis not present

## 2016-09-02 DIAGNOSIS — O10913 Unspecified pre-existing hypertension complicating pregnancy, third trimester: Secondary | ICD-10-CM

## 2016-09-02 DIAGNOSIS — Z3A34 34 weeks gestation of pregnancy: Secondary | ICD-10-CM

## 2016-09-02 DIAGNOSIS — O10919 Unspecified pre-existing hypertension complicating pregnancy, unspecified trimester: Secondary | ICD-10-CM

## 2016-09-02 DIAGNOSIS — O24419 Gestational diabetes mellitus in pregnancy, unspecified control: Secondary | ICD-10-CM

## 2016-09-02 DIAGNOSIS — Z1389 Encounter for screening for other disorder: Secondary | ICD-10-CM

## 2016-09-02 LAB — POCT URINALYSIS DIPSTICK
Blood, UA: NEGATIVE
Glucose, UA: NEGATIVE
KETONES UA: NEGATIVE
LEUKOCYTES UA: NEGATIVE
NITRITE UA: NEGATIVE
PROTEIN UA: 1

## 2016-09-02 MED ORDER — METFORMIN HCL 500 MG PO TABS: ORAL_TABLET | ORAL | 3 refills | Status: DC

## 2016-09-02 NOTE — Progress Notes (Signed)
Fetal Surveillance Testing today:  Reactive NST   High Risk Pregnancy Diagnosis(es):   Class A2 DM  G2P1001 [redacted]w[redacted]d Estimated Date of Delivery: 10/13/16  Blood pressure 120/80, pulse 78, weight 227 lb (103 kg), last menstrual period 01/07/2016.  Urinalysis: Negative   HPI: The patient is being seen today for ongoing management of class A2 DM. Today she reports CBG fasting are sub optimal   BP weight and urine results all reviewed and noted. Patient reports good fetal movement, denies any bleeding and no rupture of membranes symptoms or regular contractions.  Fundal Height:  37 Fetal Heart rate:  135 Edema:  none  Patient is without complaints other than noted in her HPI. All questions were answered.  All lab and sonogram results have been reviewed. Comments:    Assessment:  1.  Pregnancy at [redacted]w[redacted]d,  Estimated Date of Delivery: 10/13/16 :                          2.  Class A2 DM                        3.    Medication(s) Plans:  Increase PM metformin to 1000 mg, am keep at 500 mg  Treatment Plan:  Twice weekly NST/EFW 36 weeks, deliver 39 weeks or as indicated  Return in about 3 days (around 09/05/2016) for NST, HROB. for appointment for high risk OB care  Meds ordered this encounter  Medications  . metFORMIN (GLUCOPHAGE) 500 MG tablet    Sig: Take 1 tablet with breakfast and 2 tablets with dinner    Dispense:  90 tablet    Refill:  3   Orders Placed This Encounter  Procedures  . POCT urinalysis dipstick

## 2016-09-03 ENCOUNTER — Telehealth: Payer: Self-pay | Admitting: Family Medicine

## 2016-09-03 NOTE — Telephone Encounter (Signed)
Patient states she is [redacted] weeks pregnant and her OB said it is just normal -she has tried preparation H and tuck pads and warm baths without any help- is there anything else safe she can do at this poin in her pregnancy. She will make a follow up with OB if needed but yesterday they told her that was just normal pregnancy relief

## 2016-09-03 NOTE — Telephone Encounter (Signed)
So with hemorrhoids there are limited choices on what to use most individuals will use over-the-counter Preparation H which is a combination ointment to help with the hemorrhoids. The best approach is to use warm sitz baths on a fairly frequent basis over the course of the next week in addition to taking a stool softener to keep bowel movements soft. Hemorrhoids that do not respond to that sometimes have to be set up for surgical procedure to help these. Prescription steroid creams can sometimes been more beneficial then Preparation H if the patient is interested in this.(It is permissible to send then triamcinolone 0.1% cream apply 3 times a day when necessary, 30 g tube, 2 refills)

## 2016-09-03 NOTE — Telephone Encounter (Signed)
Patient advised  That Dr Nicki Reaper advises that given that she is pregnant the best approach is warm sitz bath's if it becomes too severe her OB/GYN can recommend a general surgeon who can numb the hemorrhoid and make a small cut in it in order to get the clotted blood out of the hemorrhoid. Patient verbalized understanding and will call her OB for further advisement.

## 2016-09-03 NOTE — Telephone Encounter (Signed)
Given that she is pregnant the best approach is warm sitz bath's if it becomes too severe her OB/GYN can recommend a general surgeon who can numb the hemorrhoid and make a small cut in it in order to get the clotted blood out of the hemorrhoid

## 2016-09-03 NOTE — Telephone Encounter (Signed)
patient requesting cream

## 2016-09-03 NOTE — Telephone Encounter (Signed)
Patient says she has a bad hemmroid.  She says she isn't in severe pain.  She is requesting something to be called in.   [employer]

## 2016-09-04 ENCOUNTER — Other Ambulatory Visit: Payer: Self-pay | Admitting: Advanced Practice Midwife

## 2016-09-04 DIAGNOSIS — O163 Unspecified maternal hypertension, third trimester: Secondary | ICD-10-CM

## 2016-09-04 DIAGNOSIS — O24419 Gestational diabetes mellitus in pregnancy, unspecified control: Secondary | ICD-10-CM

## 2016-09-05 ENCOUNTER — Ambulatory Visit (INDEPENDENT_AMBULATORY_CARE_PROVIDER_SITE_OTHER): Payer: Medicaid Other | Admitting: Advanced Practice Midwife

## 2016-09-05 ENCOUNTER — Encounter: Payer: Self-pay | Admitting: Advanced Practice Midwife

## 2016-09-05 ENCOUNTER — Ambulatory Visit (INDEPENDENT_AMBULATORY_CARE_PROVIDER_SITE_OTHER): Payer: Medicaid Other

## 2016-09-05 VITALS — BP 130/86 | HR 105 | Wt 227.0 lb

## 2016-09-05 DIAGNOSIS — Z1389 Encounter for screening for other disorder: Secondary | ICD-10-CM

## 2016-09-05 DIAGNOSIS — O163 Unspecified maternal hypertension, third trimester: Secondary | ICD-10-CM

## 2016-09-05 DIAGNOSIS — O0993 Supervision of high risk pregnancy, unspecified, third trimester: Secondary | ICD-10-CM

## 2016-09-05 DIAGNOSIS — O24419 Gestational diabetes mellitus in pregnancy, unspecified control: Secondary | ICD-10-CM

## 2016-09-05 DIAGNOSIS — Z3A34 34 weeks gestation of pregnancy: Secondary | ICD-10-CM | POA: Diagnosis not present

## 2016-09-05 DIAGNOSIS — O10919 Unspecified pre-existing hypertension complicating pregnancy, unspecified trimester: Secondary | ICD-10-CM

## 2016-09-05 DIAGNOSIS — Z3A35 35 weeks gestation of pregnancy: Secondary | ICD-10-CM

## 2016-09-05 DIAGNOSIS — O10913 Unspecified pre-existing hypertension complicating pregnancy, third trimester: Secondary | ICD-10-CM | POA: Diagnosis not present

## 2016-09-05 DIAGNOSIS — Z331 Pregnant state, incidental: Secondary | ICD-10-CM | POA: Diagnosis not present

## 2016-09-05 DIAGNOSIS — O0990 Supervision of high risk pregnancy, unspecified, unspecified trimester: Secondary | ICD-10-CM

## 2016-09-05 LAB — POCT URINALYSIS DIPSTICK
GLUCOSE UA: NEGATIVE
KETONES UA: NEGATIVE
Nitrite, UA: NEGATIVE
Protein, UA: NEGATIVE
RBC UA: NEGATIVE

## 2016-09-05 MED ORDER — HYDROCORTISONE ACE-PRAMOXINE 1-1 % RE FOAM
1.0000 | Freq: Two times a day (BID) | RECTAL | 3 refills | Status: DC
Start: 2016-09-05 — End: 2016-11-05

## 2016-09-05 NOTE — Progress Notes (Signed)
[place] 0000000 wks,cephalic,ant pl gr 2,BPP [id number] 9.2 cm,fhr 145 bpm,RI .56,.54 wnl

## 2016-09-05 NOTE — Patient Instructions (Signed)
AM I IN LABOR? What is labor? Labor is the work that your body does to birth your baby. Your uterus (the womb) contracts. Your cervix (the mouth of the uterus) opens. You will push your baby out into the world.  What do contractions (labor pains) feel like? When they first start, contractions usually feel like cramps during your period. Sometimes you feel pain in your back. Most often, contractions feel like muscles pulling painfully in your lower belly. At first, the contractions will probably be 15 to 20 minutes apart. They will not feel too painful. As labor goes on, the contractions get stronger, closer together, and more painful.  How do I time the contractions? Time your contractions by counting the number of minutes from the start of one contraction to the start of the next contraction.  What should I do when the contractions start? If it is night and you can sleep, sleep. If it happens during the day, here are some things you can do to take care of yourself at home: ? Walk. If the pains you are having are real labor, walking will make the contractions come faster and harder. If the contractions are not going to continue and be real labor, walking will make the contractions slow down. ? Take a shower or bath. This will help you relax. ? Eat. Labor is a big event. It takes a lot of energy. ? Drink water. Not drinking enough water can cause false labor (contractions that hurt but do not open your cervix). If this is true labor, drinking water will help you have strength to get through your labor. ? Take a nap. Get all the rest you can. ? Get a massage. If your labor is in your back, a strong massage on your lower back may feel very good. Getting a foot massage is always good. ? Don't panic. You can do this. Your body was made for this. You are strong!  When should I go to the hospital or call my health care provider? ? Your contractions have been 5 minutes apart or less for at least 1  hour. ? If several contractions are so painful you cannot walk or talk during one. ? Your bag of waters breaks. (You may have a big gush of water or just water that runs down your legs when you walk.)  Are there other reasons to call my health care provider? Yes, you should call your health care provider or go to the hospital if you start to bleed like you are having a period- blood that soaks your underwear or runs down your legs, if you have sudden severe pain, if your baby has not moved for several hours, or if you are leaking green fluid. The rule is as follows: If you are very concerned about something, call. About Hemorrhoids  Hemorrhoids are swollen veins in the lower rectum and anus.  Also called piles, hemorrhoids are a common problem.  Hemorrhoids may be internal (inside the rectum) or external (around the anus).  Internal Hemorrhoids  Internal hemorrhoids are often painless, but they rarely cause bleeding.  The internal veins may stretch and fall down (prolapse) through the anus to the outside of the body.  The veins may then become irritated and painful.  External Hemorrhoids  External hemorrhoids can be easily seen or felt around the anal opening.  They are under the skin around the anus.  When the swollen veins are scratched or broken by straining, rubbing or wiping they sometimes  bleed.  How Hemorrhoids Occur  Veins in the rectum and around the anus tend to swell under pressure.  Hemorrhoids can result from increased pressure in the veins of your anus or rectum.  Some sources of pressure are:   Straining to have a bowel movement because of constipation  Waiting too long to have a bowel movement  Coughing and sneezing often  Sitting for extended periods of time, including on the toilet  Diarrhea  Obesity  Trauma or injury to the anus  Some liver diseases  Stress  Family history of hemorrhoids  Pregnancy  Pregnant women should try to avoid becoming  constipated, because they are more likely to have hemorrhoids during pregnancy.  In the last trimester of pregnancy, the enlarged uterus may press on blood vessels and causes hemorrhoids.  In addition, the strain of childbirth sometimes causes hemorrhoids after the birth.  Symptoms of Hemorrhoids  Some symptoms of hemorrhoids include:  Swelling and/or a tender lump around the anus  Itching, mild burning and bleeding around the anus  Painful bowel movements with or without constipation  Bright red blood covering the stool, on toilet paper or in the toilet bowel.   Symptoms usually go away within a few days.  Always talk to your doctor about any bleeding to make sure it is not from some other causes.  Diagnosing and Treating Hemorrhoids  Diagnosis is made by an examination by your healthcare provider.  Special test can be performed by your doctor.    Most cases of hemorrhoids can be treated with:  High-fiber diet: Eat more high-fiber foods, which help prevent constipation.  Ask for more detailed fiber information on types and sources of fiber from your healthcare provider.  Fluids: Drink plenty of water.  This helps soften bowel movements so they are easier to pass.  Sitz baths and cold packs: Sitting in lukewarm water two or three times a day for 15 minutes cleases the anal area and may relieve discomfort.  If the water is too hot, swelling around the anus will get worse.  Placing a cloth-covered ice pack on the anus for ten minutes four times a day can also help reduce selling.  Gently pushing a prolapsed hemorrhoid back inside after the bath or ice pack can be helpful.  Medications: For mild discomfort, your healthcare provider may suggest over-the-counter pain medication or prescribe a cream or ointment for topical use.  The cream may contain witch hazel, zinc oxide or petroleum jelly.  Medicated suppositories are also a treatment option.  Always consult your doctor before applying  medications or creams.  Procedures and surgeries: There are also a number of procedures and surgeries to shrink or remove hemorrhoids in more serious cases.  Talk to your physician about these options.  You can often prevent hemorrhoids or keep them from becoming worse by maintaining a healthy lifestyle.  Eat a fiber-rich diet of fruits, vegetables and whole grains.  Also, drink plenty of water and exercise regularly.   2007, [employer] Doc.30

## 2016-09-05 NOTE — Progress Notes (Signed)
Fetal Surveillance Testing today:  NST   High Risk Pregnancy Diagnosis(es):   A2DM, CHTN, no meds  G2P1001 [redacted]w[redacted]d Estimated Date of Delivery: 10/13/16  Blood pressure 130/86, pulse (!) 105, weight 227 lb (103 kg), last menstrual period 01/07/2016.  Urinalysis: Negative   HPI: The patient is being seen today for ongoing management of the above. Today she reports FBS 98 and 97 and 2 hr PP 2 in the 130's (was about 30 minutes early).   C/O hemorroid pain/itch, not helped by OTC meds.  Rx Procotofoam   BP weight and urine results all reviewed and noted. Patient reports good fetal movement, denies any bleeding and no rupture of membranes symptoms or regular contractions.   Fetal Heart rate:  145 Edema:  no  Patient is without complaints other than noted in her HPI. All questions were answered.  All lab and sonogram results have been reviewed. Comments:  n/a  Assessment:  1.  Pregnancy at [redacted]w[redacted]d,  Estimated Date of Delivery: 10/13/16 :  A2DM, poor fasting control                        2.  CHTN, good BP, not on meds                        3.  hemorroids  Medication(s) Plans:  Continue ASA 81 mg; metformin 500mg  Am and increase to 1500 mg HS; (may need to add glyburide)  rx proctofoam  Treatment Plan:  NST twice weekly; EFW 36-38 weeks; IOL 39 weeks  Return for mon-Thurs HROB/NST. for appointment for high risk OB care  Meds ordered this encounter  Medications  . hydrocortisone-pramoxine (PROCTOFOAM HC) rectal foam    Sig: Place 1 applicator rectally 2 (two) times daily.    Dispense:  10 g    Refill:  3    Order Specific Question:   Supervising Provider    Answer:   Tania Ade H [[id number]]   Orders Placed This Encounter  Procedures  . POCT urinalysis dipstick

## 2016-09-09 ENCOUNTER — Ambulatory Visit (INDEPENDENT_AMBULATORY_CARE_PROVIDER_SITE_OTHER): Payer: Medicaid Other | Admitting: Obstetrics & Gynecology

## 2016-09-09 ENCOUNTER — Encounter: Payer: Self-pay | Admitting: Obstetrics & Gynecology

## 2016-09-09 VITALS — BP 130/80 | HR 80 | Wt 227.0 lb

## 2016-09-09 DIAGNOSIS — O24419 Gestational diabetes mellitus in pregnancy, unspecified control: Secondary | ICD-10-CM | POA: Diagnosis not present

## 2016-09-09 DIAGNOSIS — O0993 Supervision of high risk pregnancy, unspecified, third trimester: Secondary | ICD-10-CM

## 2016-09-09 DIAGNOSIS — O10919 Unspecified pre-existing hypertension complicating pregnancy, unspecified trimester: Secondary | ICD-10-CM

## 2016-09-09 DIAGNOSIS — Z3A35 35 weeks gestation of pregnancy: Secondary | ICD-10-CM | POA: Diagnosis not present

## 2016-09-09 DIAGNOSIS — O10913 Unspecified pre-existing hypertension complicating pregnancy, third trimester: Secondary | ICD-10-CM | POA: Diagnosis not present

## 2016-09-09 DIAGNOSIS — Z331 Pregnant state, incidental: Secondary | ICD-10-CM

## 2016-09-09 DIAGNOSIS — Z1389 Encounter for screening for other disorder: Secondary | ICD-10-CM | POA: Diagnosis not present

## 2016-09-09 LAB — POCT URINALYSIS DIPSTICK
Blood, UA: NEGATIVE
Ketones, UA: NEGATIVE
Leukocytes, UA: NEGATIVE
NITRITE UA: NEGATIVE

## 2016-09-09 MED ORDER — GLYBURIDE 2.5 MG PO TABS: ORAL_TABLET | ORAL | 3 refills | Status: DC

## 2016-09-09 NOTE — Progress Notes (Signed)
Fetal Surveillance Testing today:  Reactive NST   High Risk Pregnancy Diagnosis(es):   Class A2 DM  G2P1001 [redacted]w[redacted]d Estimated Date of Delivery: 10/13/16  Blood pressure 130/80, pulse 80, weight 227 lb (103 kg), last menstrual period 01/07/2016.  Urinalysis: Negative   HPI: The patient is being seen today for ongoing management of Class A2 DM . Today she reports CBG suboptimal in the am   BP weight and urine results all reviewed and noted. Patient reports good fetal movement, denies any bleeding and no rupture of membranes symptoms or regular contractions.  Fundal Height:  36 Fetal Heart rate:  135 Edema:  none  Patient is without complaints other than noted in her HPI. All questions were answered.  All lab and sonogram results have been reviewed. Comments:    Assessment:  1.  Pregnancy at [redacted]w[redacted]d,  Estimated Date of Delivery: 10/13/16 :                          2.  Class A2 DM                        3.    Medication(s) Plans:  Add glyburide 2.5 mg to pm metformin 1000 mg pm to metformin 500 am  Treatment Plan:  Twice weekly NST, efw 36-37 weeks, induce 39 weeks or as clinically indicated  Return in about 3 days (around 09/12/2016) for NST, HROB. for appointment for high risk OB care  Meds ordered this encounter  Medications  . glyBURIDE (DIABETA) 2.5 MG tablet    Sig: Take 1 at dinnertime    Dispense:  30 tablet    Refill:  3   Orders Placed This Encounter  Procedures  . POCT urinalysis dipstick

## 2016-09-12 ENCOUNTER — Ambulatory Visit (INDEPENDENT_AMBULATORY_CARE_PROVIDER_SITE_OTHER): Payer: Medicaid Other | Admitting: Women's Health

## 2016-09-12 ENCOUNTER — Other Ambulatory Visit: Payer: Medicaid Other

## 2016-09-12 ENCOUNTER — Encounter: Payer: Medicaid Other | Admitting: Obstetrics and Gynecology

## 2016-09-12 ENCOUNTER — Encounter: Payer: Self-pay | Admitting: Women's Health

## 2016-09-12 VITALS — BP 140/80 | HR 80 | Wt 231.0 lb

## 2016-09-12 DIAGNOSIS — O10919 Unspecified pre-existing hypertension complicating pregnancy, unspecified trimester: Secondary | ICD-10-CM

## 2016-09-12 DIAGNOSIS — Z1389 Encounter for screening for other disorder: Secondary | ICD-10-CM | POA: Diagnosis not present

## 2016-09-12 DIAGNOSIS — Z3A35 35 weeks gestation of pregnancy: Secondary | ICD-10-CM | POA: Diagnosis not present

## 2016-09-12 DIAGNOSIS — Z331 Pregnant state, incidental: Secondary | ICD-10-CM | POA: Diagnosis not present

## 2016-09-12 DIAGNOSIS — O24419 Gestational diabetes mellitus in pregnancy, unspecified control: Secondary | ICD-10-CM | POA: Diagnosis not present

## 2016-09-12 DIAGNOSIS — O0993 Supervision of high risk pregnancy, unspecified, third trimester: Secondary | ICD-10-CM

## 2016-09-12 DIAGNOSIS — O10913 Unspecified pre-existing hypertension complicating pregnancy, third trimester: Secondary | ICD-10-CM

## 2016-09-12 LAB — POCT URINALYSIS DIPSTICK
Blood, UA: NEGATIVE
Glucose, UA: NEGATIVE
Ketones, UA: NEGATIVE
Leukocytes, UA: NEGATIVE
Nitrite, UA: NEGATIVE

## 2016-09-12 NOTE — Patient Instructions (Addendum)
Increase metformin to 1,000mg  in the morning and at night Increase night time glyburide to 5mg  (from 2.5mg )  Call the office [phone]) or go to [facility] if:  You begin to have strong, frequent contractions  Your water breaks.  Sometimes it is a big gush of fluid, sometimes it is just a trickle that keeps getting your panties wet or running down your legs  You have vaginal bleeding.  It is normal to have a small amount of spotting if your cervix was checked.   You don't feel your baby moving like normal.  If you don't, get you something to eat and drink and lay down and focus on feeling your baby move.  You should feel at least 10 movements in 2 hours.  If you don't, you should call the office or go to [facility]    Call the office [phone]) or go to [facility] hospital for these signs of pre-eclampsia:  Severe headache that does not go away with Tylenol  Visual changes- seeing spots, double, blurred vision  Pain under your right breast or upper abdomen that does not go away with Tums or heartburn medicine  Nausea and/or vomiting  Severe swelling in your hands, feet, and face      Preterm Labor and Birth Information The normal length of a pregnancy is 39-41 weeks. Preterm labor is when labor starts before 37 completed weeks of pregnancy. What are the risk factors for preterm labor? Preterm labor is more likely to occur in women who:  Have certain infections during pregnancy such as a bladder infection, sexually transmitted infection, or infection inside the uterus (chorioamnionitis).  Have a shorter-than-normal cervix.  Have gone into preterm labor before.  Have had surgery on their cervix.  Are younger than age 70 or older than age 12.  Are African American.  Are pregnant with twins or multiple babies (multiple gestation).  Take street drugs or smoke while pregnant.  Do not gain enough weight while pregnant.  Became pregnant shortly after having been  pregnant. What are the symptoms of preterm labor? Symptoms of preterm labor include:  Cramps similar to those that can happen during a menstrual period. The cramps may happen with diarrhea.  Pain in the abdomen or lower back.  Regular uterine contractions that may feel like tightening of the abdomen.  A feeling of increased pressure in the pelvis.  Increased watery or bloody mucus discharge from the vagina.  Water breaking (ruptured amniotic sac). Why is it important to recognize signs of preterm labor? It is important to recognize signs of preterm labor because babies who are born prematurely may not be fully developed. This can put them at an increased risk for:  Long-term (chronic) heart and lung problems.  Difficulty immediately after birth with regulating body systems, including blood sugar, body temperature, heart rate, and breathing rate.  Bleeding in the brain.  Cerebral palsy.  Learning difficulties.  Death. These risks are highest for babies who are born before 76 weeks of pregnancy. How is preterm labor treated? Treatment depends on the length of your pregnancy, your condition, and the health of your baby. It may involve:  Having a stitch (suture) placed in your cervix to prevent your cervix from opening too early (cerclage).  Taking or being given medicines, such as:  Hormone medicines. These may be given early in pregnancy to help support the pregnancy.  Medicine to stop contractions.  Medicines to help mature the baby's lungs. These may be prescribed if the  risk of delivery is high.  Medicines to prevent your baby from developing cerebral palsy. If the labor happens before 34 weeks of pregnancy, you may need to stay in the hospital. What should I do if I think I am in preterm labor? If you think that you are going into preterm labor, call your health care provider right away. How can I prevent preterm labor in future pregnancies? To increase your chance  of having a full-term pregnancy:  Do not use any tobacco products, such as cigarettes, chewing tobacco, and e-cigarettes. If you need help quitting, ask your health care provider.  Do not use street drugs or medicines that have not been prescribed to you during your pregnancy.  Talk with your health care provider before taking any herbal supplements, even if you have been taking them regularly.  Make sure you gain a healthy amount of weight during your pregnancy.  Watch for infection. If you think that you might have an infection, get it checked right away.  Make sure to tell your health care provider if you have gone into preterm labor before. This information is not intended to replace advice given to you by your health care provider. Make sure you discuss any questions you have with your health care provider. Document Released: 09/28/2003 Document Revised: 12/19/2015 Document Reviewed: 11/29/2015 Elsevier Interactive Patient Education  2017 [employer].

## 2016-09-12 NOTE — Progress Notes (Signed)
High Risk Pregnancy Diagnosis(es): A2DM, CHTN- no meds G2P1001 [redacted]w[redacted]d Estimated Date of Delivery: 10/13/16 BP 140/80   Pulse 80   Wt 231 lb (104.8 kg)   LMP 01/07/2016 (Exact Date)   BMI 39.65 kg/m   Urinalysis: Positive for tr protein HPI:  FBS 98-110, 2hr pp 140-160, doesn't feel diet has changed, has gained 4lb since last visit 3d ago. Denies ha, visual changes, ruq/epigastric pain, n/v.   BP, weight, and urine reviewed.  Reports good fm. Denies regular uc's, lof, vb, uti s/s. No complaints.  Fundal Height:  38 Fetal Heart rate:  140, reactive NST Edema:  Trace, DTRs2+, no clonus  Reviewed ptl s/s, pre-e s/s, fkc All questions were answered Assessment: [redacted]w[redacted]d A2DM not currently well-controlled, CHTN- no meds Medication(s) Plans:  Increase metformin to 1,000mg  BID and glyburide to 5mg  pm Treatment Plan:  2x/wk testing nst alt w/ sono, IOL @ 39wks or as clinically indicated Follow up on Monday for high-risk OB appt and bpp/dopp u/s

## 2016-09-16 ENCOUNTER — Encounter: Payer: Self-pay | Admitting: Obstetrics & Gynecology

## 2016-09-16 ENCOUNTER — Ambulatory Visit (INDEPENDENT_AMBULATORY_CARE_PROVIDER_SITE_OTHER): Payer: Medicaid Other | Admitting: Obstetrics & Gynecology

## 2016-09-16 ENCOUNTER — Ambulatory Visit (INDEPENDENT_AMBULATORY_CARE_PROVIDER_SITE_OTHER): Payer: Medicaid Other

## 2016-09-16 VITALS — BP 140/90 | HR 78 | Wt 230.0 lb

## 2016-09-16 DIAGNOSIS — Z3A36 36 weeks gestation of pregnancy: Secondary | ICD-10-CM

## 2016-09-16 DIAGNOSIS — Z1389 Encounter for screening for other disorder: Secondary | ICD-10-CM

## 2016-09-16 DIAGNOSIS — O10913 Unspecified pre-existing hypertension complicating pregnancy, third trimester: Secondary | ICD-10-CM

## 2016-09-16 DIAGNOSIS — O10919 Unspecified pre-existing hypertension complicating pregnancy, unspecified trimester: Secondary | ICD-10-CM

## 2016-09-16 DIAGNOSIS — Z331 Pregnant state, incidental: Secondary | ICD-10-CM

## 2016-09-16 DIAGNOSIS — O0990 Supervision of high risk pregnancy, unspecified, unspecified trimester: Secondary | ICD-10-CM

## 2016-09-16 DIAGNOSIS — O0993 Supervision of high risk pregnancy, unspecified, third trimester: Secondary | ICD-10-CM

## 2016-09-16 DIAGNOSIS — O24419 Gestational diabetes mellitus in pregnancy, unspecified control: Secondary | ICD-10-CM

## 2016-09-16 LAB — POCT URINALYSIS DIPSTICK
Glucose, UA: NEGATIVE
Leukocytes, UA: NEGATIVE
Nitrite, UA: NEGATIVE
RBC UA: NEGATIVE

## 2016-09-16 NOTE — Progress Notes (Signed)
Fetal Surveillance Testing today:  BPP 8/8 with normal Doppler   High Risk Pregnancy Diagnosis(es):   Class A2 DM, CHTN no meds  G2P1001 [redacted]w[redacted]d Estimated Date of Delivery: 10/13/16  Blood pressure 140/90, pulse 78, weight 230 lb (104.3 kg), last menstrual period 01/07/2016.  Urinalysis: Negative   HPI: The patient is being seen today for ongoing management of as above. Today she reports CBG are finally good today   BP weight and urine results all reviewed and noted. Patient reports good fetal movement, denies any bleeding and no rupture of membranes symptoms or regular contractions.  Fundal Height:  38 Fetal Heart rate:  153 Edema:  none  Patient is without complaints other than noted in her HPI. All questions were answered.  All lab and sonogram results have been reviewed. Comments:    Assessment:  1.  Pregnancy at [redacted]w[redacted]d,  Estimated Date of Delivery: 10/13/16 :                          2.  Class A2 DM, EFW 70%                        3.  CHTN  Medication(s) Plans:  Metformin 1000 BID + glyburide 5 mg pm  Treatment Plan:  Twice weekly surveillance, sonogram alternating with NST, induction at 39 weeks or as clinically indicated   Return in about 3 days (around 09/19/2016) for NST, HROB. for appointment for high risk OB care  No orders of the defined types were placed in this encounter.  Orders Placed This Encounter  Procedures  . POCT urinalysis dipstick

## 2016-09-16 NOTE — Progress Notes (Signed)
[place] [id number] wks,cephalic,ant pl gr 3, BPP 8/8,bilat adnexa's wnl,fhr 153 bpm,afi 9.5 cm,RI .58,.53 wnl,EFW 3150 g 70%

## 2016-09-19 ENCOUNTER — Ambulatory Visit (INDEPENDENT_AMBULATORY_CARE_PROVIDER_SITE_OTHER): Payer: Medicaid Other | Admitting: Women's Health

## 2016-09-19 ENCOUNTER — Encounter: Payer: Self-pay | Admitting: Women's Health

## 2016-09-19 VITALS — BP 136/76 | HR 72 | Wt 230.0 lb

## 2016-09-19 DIAGNOSIS — Z331 Pregnant state, incidental: Secondary | ICD-10-CM

## 2016-09-19 DIAGNOSIS — O10913 Unspecified pre-existing hypertension complicating pregnancy, third trimester: Secondary | ICD-10-CM | POA: Diagnosis not present

## 2016-09-19 DIAGNOSIS — Z3483 Encounter for supervision of other normal pregnancy, third trimester: Secondary | ICD-10-CM

## 2016-09-19 DIAGNOSIS — O24419 Gestational diabetes mellitus in pregnancy, unspecified control: Secondary | ICD-10-CM | POA: Diagnosis not present

## 2016-09-19 DIAGNOSIS — Z3A37 37 weeks gestation of pregnancy: Secondary | ICD-10-CM | POA: Diagnosis not present

## 2016-09-19 DIAGNOSIS — Z1389 Encounter for screening for other disorder: Secondary | ICD-10-CM | POA: Diagnosis not present

## 2016-09-19 DIAGNOSIS — O0993 Supervision of high risk pregnancy, unspecified, third trimester: Secondary | ICD-10-CM | POA: Diagnosis not present

## 2016-09-19 DIAGNOSIS — O10919 Unspecified pre-existing hypertension complicating pregnancy, unspecified trimester: Secondary | ICD-10-CM

## 2016-09-19 LAB — POCT URINALYSIS DIPSTICK
Blood, UA: NEGATIVE
Glucose, UA: NEGATIVE
KETONES UA: NEGATIVE
Leukocytes, UA: NEGATIVE
Nitrite, UA: NEGATIVE
PROTEIN UA: NEGATIVE

## 2016-09-19 LAB — OB RESULTS CONSOLE GBS: GBS: POSITIVE

## 2016-09-19 NOTE — Patient Instructions (Signed)
Call the office ([phone]) or go to [facility] if:  You begin to have strong, frequent contractions  Your water breaks.  Sometimes it is a big gush of fluid, sometimes it is just a trickle that keeps getting your panties wet or running down your legs  You have vaginal bleeding.  It is normal to have a small amount of spotting if your cervix was checked.   You don't feel your baby moving like normal.  If you don't, get you something to eat and drink and lay down and focus on feeling your baby move.  You should feel at least 10 movements in 2 hours.  If you don't, you should call the office or go to [facility].     Braxton Hicks Contractions Contractions of the uterus can occur throughout pregnancy, but they are not always a sign that you are in labor. You may have practice contractions called Braxton Hicks contractions. These false labor contractions are sometimes confused with true labor. What are Braxton Hicks contractions? Braxton Hicks contractions are tightening movements that occur in the muscles of the uterus before labor. Unlike true labor contractions, these contractions do not result in opening (dilation) and thinning of the cervix. Toward the end of pregnancy (32-34 weeks), Braxton Hicks contractions can happen more often and may become stronger. These contractions are sometimes difficult to tell apart from true labor because they can be very uncomfortable. You should not feel embarrassed if you go to the hospital with false labor. Sometimes, the only way to tell if you are in true labor is for your health care provider to look for changes in the cervix. The health care provider will do a physical exam and may monitor your contractions. If you are not in true labor, the exam should show that your cervix is not dilating and your water has not broken. If there are no prenatal problems or other health problems associated with your pregnancy, it is completely safe for you to be sent  home with false labor. You may continue to have Braxton Hicks contractions until you go into true labor. How can I tell the difference between true labor and false labor?  Differences ? False labor ? Contractions last 30-70 seconds.: Contractions are usually shorter and not as strong as true labor contractions. ? Contractions become very regular.: Contractions are usually irregular. ? Discomfort is usually felt in the top of the uterus, and it spreads to the lower abdomen and low back.: Contractions are often felt in the front of the lower abdomen and in the groin. ? Contractions do not go away with walking.: Contractions may go away when you walk around or change positions while lying down. ? Contractions usually become more intense and increase in frequency.: Contractions get weaker and are shorter-lasting as time goes on. ? The cervix dilates and gets thinner.: The cervix usually does not dilate or become thin. Follow these instructions at home:  Take over-the-counter and prescription medicines only as told by your health care provider.  Keep up with your usual exercises and follow other instructions from your health care provider.  Eat and drink lightly if you think you are going into labor.  If Braxton Hicks contractions are making you uncomfortable: ? Change your position from lying down or resting to walking, or change from walking to resting. ? Sit and rest in a tub of warm water. ? Drink enough fluid to keep your urine clear or pale yellow. Dehydration may cause these contractions. ?   Do slow and deep breathing several times an hour.  Keep all follow-up prenatal visits as told by your health care provider. This is important. Contact a health care provider if:  You have a fever.  You have continuous pain in your abdomen. Get help right away if:  Your contractions become stronger, more regular, and closer together.  You have fluid leaking or gushing from your vagina.  You  pass blood-tinged mucus (bloody show).  You have bleeding from your vagina.  You have low back pain that you never had before.  You feel your baby's head pushing down and causing pelvic pressure.  Your baby is not moving inside you as much as it used to. Summary  Contractions that occur before labor are called Braxton Hicks contractions, false labor, or practice contractions.  Braxton Hicks contractions are usually shorter, weaker, farther apart, and less regular than true labor contractions. True labor contractions usually become progressively stronger and regular and they become more frequent.  Manage discomfort from Braxton Hicks contractions by changing position, resting in a warm bath, drinking plenty of water, or practicing deep breathing. This information is not intended to replace advice given to you by your health care provider. Make sure you discuss any questions you have with your health care provider. Document Released: 07/08/2005 Document Revised: 05/27/2016 Document Reviewed: 05/27/2016 Elsevier Interactive Patient Education  2017 [employer]  

## 2016-09-19 NOTE — Progress Notes (Signed)
High Risk Pregnancy Diagnosis(es): A2DM, CHTN- no meds G2P1001 105w4d Estimated Date of Delivery: 10/13/16 BP 136/76   Pulse 72   Wt 230 lb (104.3 kg)   LMP 01/07/2016 (Exact Date)   BMI 39.48 kg/m   Urinalysis: Negative HPI:  Reports sugars doing well, thought she had them listed in phone but can't find them BP, weight, and urine reviewed.  Reports good fm. Denies regular uc's, lof, vb, uti s/s. No complaints.  Fundal Height:  36 Fetal Heart rate:  125, reactive NST Edema:  Trace GBS, gc/ct collected SVE per request: 1/th/-3, vtx  Reviewed ptl s/s, fkc All questions were answered Assessment: 105w4d A2DM, CHTN- no meds Medication(s) Plans:  Metformin 1000bid & glyburide 5mg  pm Treatment Plan:  2x/wk testing nst alt w/ bpp/dopp, IOL @ 39wks  Follow up on Monday for high-risk OB appt and bpp/dopp u/s

## 2016-09-20 ENCOUNTER — Telehealth: Payer: Self-pay | Admitting: *Deleted

## 2016-09-20 LAB — GC/CHLAMYDIA PROBE AMP
CHLAMYDIA, DNA PROBE: NEGATIVE
Neisseria gonorrhoeae by PCR: NEGATIVE

## 2016-09-20 NOTE — Telephone Encounter (Signed)
Patient called stating she has had some light pink spotting with a stringy discharge since last night and some mild cramping. She was examined yesterday by [name] and was 1cm. I informed patient that the spotting could be from the exam or the beginning of early labor. [facility] bleeding became bright red, more intense contractions or leaking fluid. Encouraged her to increase fluids and try Tylenol for the cramping if she felt she needed it. Pt verbalized understanding.

## 2016-09-21 ENCOUNTER — Inpatient Hospital Stay (HOSPITAL_COMMUNITY)
Admission: AD | Admit: 2016-09-21 | Discharge: 2016-09-21 | Disposition: A | Discharge status: Home / Self Care | Payer: Medicaid Other | Source: Ambulatory Visit | Attending: Obstetrics & Gynecology | Admitting: Obstetrics & Gynecology

## 2016-09-21 ENCOUNTER — Encounter (HOSPITAL_COMMUNITY): Payer: Self-pay | Admitting: *Deleted

## 2016-09-21 DIAGNOSIS — O10919 Unspecified pre-existing hypertension complicating pregnancy, unspecified trimester: Secondary | ICD-10-CM

## 2016-09-21 DIAGNOSIS — Z885 Allergy status to narcotic agent status: Secondary | ICD-10-CM | POA: Insufficient documentation

## 2016-09-21 DIAGNOSIS — O1203 Gestational edema, third trimester: Secondary | ICD-10-CM | POA: Diagnosis not present

## 2016-09-21 DIAGNOSIS — O99333 Smoking (tobacco) complicating pregnancy, third trimester: Secondary | ICD-10-CM | POA: Diagnosis not present

## 2016-09-21 DIAGNOSIS — O24113 Pre-existing diabetes mellitus, type 2, in pregnancy, third trimester: Secondary | ICD-10-CM | POA: Diagnosis not present

## 2016-09-21 DIAGNOSIS — Z7982 Long term (current) use of aspirin: Secondary | ICD-10-CM | POA: Insufficient documentation

## 2016-09-21 DIAGNOSIS — Z3A36 36 weeks gestation of pregnancy: Secondary | ICD-10-CM | POA: Diagnosis not present

## 2016-09-21 DIAGNOSIS — O163 Unspecified maternal hypertension, third trimester: Secondary | ICD-10-CM | POA: Diagnosis present

## 2016-09-21 DIAGNOSIS — E119 Type 2 diabetes mellitus without complications: Secondary | ICD-10-CM | POA: Insufficient documentation

## 2016-09-21 DIAGNOSIS — Z7984 Long term (current) use of oral hypoglycemic drugs: Secondary | ICD-10-CM | POA: Diagnosis not present

## 2016-09-21 DIAGNOSIS — F17210 Nicotine dependence, cigarettes, uncomplicated: Secondary | ICD-10-CM | POA: Diagnosis not present

## 2016-09-21 HISTORY — DX: Essential (primary) hypertension: I10

## 2016-09-21 LAB — PROTEIN / CREATININE RATIO, URINE
Creatinine, Urine: 203 mg/dL
Protein Creatinine Ratio: 0.05 mg/mg{Cre} (ref 0.00–0.15)
Total Protein, Urine: 10 mg/dL

## 2016-09-21 LAB — CBC
HEMATOCRIT: 35 % — AB (ref 36.0–46.0)
HEMOGLOBIN: 12.1 g/dL (ref 12.0–15.0)
MCH: 31 pg (ref 26.0–34.0)
MCHC: 34.6 g/dL (ref 30.0–36.0)
MCV: 89.7 fL (ref 78.0–100.0)
Platelets: 258 10*3/uL (ref 150–400)
RBC: 3.9 MIL/uL (ref 3.87–5.11)
RDW: 13.2 % (ref 11.5–15.5)
WBC: 8.7 10*3/uL (ref 4.0–10.5)

## 2016-09-21 LAB — COMPREHENSIVE METABOLIC PANEL
ALK PHOS: 104 U/L (ref 38–126)
ALT: 28 U/L (ref 14–54)
AST: 24 U/L (ref 15–41)
Albumin: 2.7 g/dL — ABNORMAL LOW (ref 3.5–5.0)
Anion gap: 7 (ref 5–15)
BILIRUBIN TOTAL: 0.2 mg/dL — AB (ref 0.3–1.2)
BUN: 10 mg/dL (ref 6–20)
CALCIUM: 8.4 mg/dL — AB (ref 8.9–10.3)
CO2: 21 mmol/L — ABNORMAL LOW (ref 22–32)
Chloride: 108 mmol/L (ref 101–111)
Creatinine, Ser: 0.52 mg/dL (ref 0.44–1.00)
GFR calc Af Amer: >60 mL/min (ref >60)
GFR calc non Af Amer: >60 mL/min (ref >60)
GLUCOSE: 96 mg/dL (ref 65–99)
POTASSIUM: 3.6 mmol/L (ref 3.5–5.1)
Sodium: 136 mmol/L (ref 135–145)
TOTAL PROTEIN: 6.6 g/dL (ref 6.5–8.1)

## 2016-09-21 LAB — URINALYSIS, ROUTINE W REFLEX MICROSCOPIC
Bilirubin Urine: NEGATIVE
Glucose, UA: 50 mg/dL — AB
Hgb urine dipstick: NEGATIVE
Ketones, ur: NEGATIVE mg/dL
Nitrite: NEGATIVE
PROTEIN: 30 mg/dL — AB
Specific Gravity, Urine: 1.029 (ref 1.005–1.030)
pH: 6 (ref 5.0–8.0)

## 2016-09-21 LAB — STREP GP B NAA: STREP GROUP B AG: POSITIVE — AB

## 2016-09-21 NOTE — Discharge Instructions (Signed)
Hypertension During Pregnancy °Hypertension, commonly called high blood pressure, is when the force of blood pumping through your arteries is too strong. Arteries are blood vessels that carry blood from the heart throughout the body. Hypertension during pregnancy can cause problems for you and your baby. Your baby may be born early (prematurely) or may not weigh as much as he or she should at birth. Very bad cases of hypertension during pregnancy can be life-threatening. °Different types of hypertension can occur during pregnancy. These include: °· Chronic hypertension. This happens when: °¨ You have hypertension before pregnancy and it continues during pregnancy. °¨ You develop hypertension before you are [redacted] weeks pregnant, and it continues during pregnancy. °· Gestational hypertension. This is hypertension that develops after the 20th week of pregnancy. °· Preeclampsia, also called toxemia of pregnancy. This is a very serious type of hypertension that develops only during pregnancy. It affects the whole body, and it can be very dangerous for you and your baby. °Gestational hypertension and preeclampsia usually go away within 6 weeks after your baby is born. Women who have hypertension during pregnancy have a greater chance of developing hypertension later in life or during future pregnancies. °What are the causes? °The exact cause of hypertension is not known. °What increases the risk? °There are certain factors that make it more likely for you to develop hypertension during pregnancy. These include: °· Having hypertension during a previous pregnancy or prior to pregnancy. °· Being overweight. °· Being older than age 40. °· Being pregnant for the first time or being pregnant with more than one baby. °· Becoming pregnant using fertilization methods such as IVF (in vitro fertilization). °· Having diabetes, kidney problems, or systemic lupus erythematosus. °· Having a family history of hypertension. °What are the  signs or symptoms? °Chronic hypertension and gestational hypertension rarely cause symptoms. Preeclampsia causes symptoms, which may include: °· Increased protein in your urine. Your health care provider will check for this at every visit before you give birth (prenatal visit). °· Severe headaches. °· Sudden weight gain. °· Swelling of the hands, face, legs, and feet. °· Nausea and vomiting. °· Vision problems, such as blurred or double vision. °· Numbness in the face, arms, legs, and feet. °· Dizziness. °· Slurred speech. °· Sensitivity to bright lights. °· Abdominal pain. °· Convulsions. °How is this diagnosed? °You may be diagnosed with hypertension during a routine prenatal exam. At each prenatal visit, you may: °· Have a urine test to check for high amounts of protein in your urine. °· Have your blood pressure checked. A blood pressure reading is recorded as two numbers, such as "120 over 80" (or 120/80). The first ("top") number is called the systolic pressure. It is a measure of the pressure in your arteries when your heart beats. The second ("bottom") number is called the diastolic pressure. It is a measure of the pressure in your arteries as your heart relaxes between beats. Blood pressure is measured in a unit called mm Hg. A normal blood pressure reading is: °¨ Systolic: below 120. °¨ Diastolic: below 80. °The type of hypertension that you are diagnosed with depends on your test results and when your symptoms developed. °· Chronic hypertension is usually diagnosed before 20 weeks of pregnancy. °· Gestational hypertension is usually diagnosed after 20 weeks of pregnancy. °· Hypertension with high amounts of protein in the urine is diagnosed as preeclampsia. °· Blood pressure measurements that stay above 160 systolic, or above 110 diastolic, are signs of severe preeclampsia. °  How is this treated? °Treatment for hypertension during pregnancy varies depending on the type of hypertension you have and how  serious it is. °· If you take medicines called ACE inhibitors to treat chronic hypertension, you may need to switch medicines. ACE inhibitors should not be taken during pregnancy. °· If you have gestational hypertension, you may need to take blood pressure medicine. °· If you are at risk for preeclampsia, your health care provider may recommend that you take a low-dose aspirin every day to prevent high blood pressure during your pregnancy. °· If you have severe preeclampsia, you may need to be hospitalized so you and your baby can be monitored closely. You may also need to take medicine (magnesium sulfate) to prevent seizures and to lower blood pressure. This medicine may be given as an injection or through an IV tube. °· In some cases, if your condition gets worse, you may need to deliver your baby early. °Follow these instructions at home: °Eating and drinking °· Drink enough fluid to keep your urine clear or pale yellow. °· Eat a healthy diet that is low in salt (sodium). Do not add salt to your food. Check food labels to see how much sodium a food or beverage contains. °Lifestyle °· Do not use any products that contain nicotine or tobacco, such as cigarettes and e-cigarettes. If you need help quitting, ask your health care provider. °· Do not use alcohol. °· Avoid caffeine. °· Avoid stress as much as possible. Rest and get plenty of sleep. °General instructions °· Take over-the-counter and prescription medicines only as told by your health care provider. °· While lying down, lie on your left side. This keeps pressure off your baby. °· While sitting or lying down, raise (elevate) your feet. Try putting some pillows under your lower legs. °· Exercise regularly. Ask your health care provider what kinds of exercise are best for you. °· Keep all prenatal and follow-up visits as told by your health care provider. This is important. °Contact a health care provider if: °· You have symptoms that your health care provider  told you may require more treatment or monitoring, such as: °¨ Fever. °¨ Vomiting. °¨ Headache. °Get help right away if: °· You have severe abdominal pain or vomiting that does not get better with treatment. °· You suddenly develop swelling in your hands, ankles, or face. °· You gain 4 lbs (1.8 kg) or more in 1 week. °· You develop vaginal bleeding, or you have blood in your urine. °· You do not feel your baby moving as much as usual. °· You have blurred or double vision. °· You have muscle twitching or sudden tightening (spasms). °· You have shortness of breath. °· Your lips or fingernails turn blue. °This information is not intended to replace advice given to you by your health care provider. Make sure you discuss any questions you have with your health care provider. °Document Released: 03/26/2011 Document Revised: 01/26/2016 Document Reviewed: 12/22/2015 °Elsevier Interactive Patient Education © 2017 Elsevier Inc. ° °

## 2016-09-21 NOTE — MAU Provider Note (Signed)
History     CSN: [id number]  Arrival date and time: 09/21/16 1309   First Provider Initiated Contact with Patient 09/21/16 1425      Chief Complaint  Patient presents with  . Leg Swelling  . Hypertension   HPI Marissa Lane is a 26 y.o. G2P1001 at [redacted]w[redacted]d who presents with LE edema & hypertension. Hx of chronic hypertension diagnosed at beginning of this pregnancy; no meds.  Since last night has had a few episodes of dizziness & noticed her feet were more swollen than usual. Took BP today at home & was elevated to 140s/80s.  Denies headache, vision changes, CP, SOB, abdominal pain, or LOF. Had episode of blood tinged mucous yesterday after cervical exam.  Positive fetal movement.   OB History    Gravida Para Term Preterm AB Living   2 1 1     1    SAB TAB Ectopic Multiple Live Births           1      Past Medical History:  Diagnosis Date  . Acne   . Chest pain   . Diabetes mellitus without complication ([place])   . Dysthymic disorder    over dose age 50  . GERD (gastroesophageal reflux disease)   . H/O urinary frequency 06/25/2013  . Hyperinsulinemia   . IFG (impaired fasting glucose)   . Irregular intermenstrual bleeding 10/12/2013  . Obesity   . Palpitations   . PCOS (polycystic ovarian syndrome)   . Postpartum depression   . Tobacco abuse     Past Surgical History:  Procedure Laterality Date  . CHOLECYSTECTOMY  03/06/2012   Procedure: LAPAROSCOPIC CHOLECYSTECTOMY;  Surgeon: Donato Heinz, MD;  Location: [facility];  Service: General;  Laterality: N/A;  . ESOPHAGOGASTRODUODENOSCOPY N/A 09/09/2012   Procedure: ESOPHAGOGASTRODUODENOSCOPY (EGD);  Surgeon: Rogene Houston, MD;  Location: [facility];  Service: Endoscopy;  Laterality: N/A;  340  . MOUTH SURGERY    . SKIN GRAFT  2009   gum graft  . TONSILLECTOMY      Family History  Problem Relation Age of Onset  . Adopted: Yes  . Congestive Heart Failure Mother   . Irritable bowel syndrome Mother   . Ulcers Mother    . Anemia Mother   . Hypertension Brother   . Colon cancer Maternal Grandmother 93  . Heart disease Maternal Grandfather 59    MI    Social History  Substance Use Topics  . Smoking status: Current Every Day Smoker    Packs/day: 0.50    Years: 5.00    Types: Cigarettes  . Smokeless tobacco: Current User  . Alcohol use No    Allergies:  Allergies  Allergen Reactions  . Cymbalta [Duloxetine Hcl] Other (See Comments)    Worsening depression  . Vicodin [Hydrocodone-Acetaminophen] Other (See Comments)    Bad migraine  . Zofran [Ondansetron Hcl]     Prescriptions Prior to Admission  Medication Sig Dispense Refill Last Dose  . acetaminophen (TYLENOL) 500 MG tablet Take 500 mg by mouth every 6 (six) hours as needed (for pain.).    1 week ago  . aspirin EC 81 MG tablet Take 1 tablet (81 mg total) by mouth daily. 30 tablet 6 09/20/2016 at Unknown time  . hydrocortisone-pramoxine (PROCTOFOAM HC) rectal foam Place 1 applicator rectally 2 (two) times daily. (Patient taking differently: Place 1 applicator rectally 2 (two) times daily as needed for hemorrhoids or itching. ) 10 g 3 1 week ago  .  metFORMIN (GLUCOPHAGE) 500 MG tablet Take 1 tablet with breakfast and 2 tablets with dinner (Patient taking differently: Take 1,000 mg by mouth 2 (two) times daily. Take 1 tablet with breakfast and 2 tablets with dinner) 90 tablet 3 09/20/2016 at Unknown time  . omeprazole (PRILOSEC) 20 MG capsule Take 1 capsule (20 mg total) by mouth daily. 1 tablet a day 30 capsule 6 09/20/2016 at Unknown time  . Prenatal Vit-Fe Fumarate-FA (PRENATAL VITAMIN PO) Take 1 tablet by mouth daily.   09/20/2016 at Unknown time  . glyBURIDE (DIABETA) 2.5 MG tablet Take 1 at dinnertime (Patient not taking: Reported on 09/19/2016) 30 tablet 3 Not Taking    Review of Systems  Constitutional: Negative.   Respiratory: Negative.   Cardiovascular: Negative.   Gastrointestinal: Negative.   Genitourinary: Positive for vaginal discharge.  Negative for vaginal bleeding.  Neurological: Positive for dizziness (none currently). Negative for headaches.   Physical Exam   Blood pressure 112/55, pulse 105, last menstrual period 01/07/2016.  Pulse Rate:  [95-106] 105 (03/03 1414) BP: (112-136)/(55-88) 112/55 (03/03 1414)   Physical Exam  Nursing note and vitals reviewed. Constitutional: She is oriented to person, place, and time. She appears well-developed and well-nourished. No distress.  HENT:  Head: Normocephalic and atraumatic.  Eyes: Conjunctivae are normal. Right eye exhibits no discharge. Left eye exhibits no discharge. No scleral icterus.  Neck: Normal range of motion.  Cardiovascular: Normal rate, regular rhythm and normal heart sounds.   No murmur heard. Pulses:      Dorsalis pedis pulses are 2+ on the right side, and 2+ on the left side.  Respiratory: Effort normal and breath sounds normal. No respiratory distress. She has no wheezes.  GI: Soft. Bowel sounds are normal. There is no tenderness.  Musculoskeletal: She exhibits edema (minimal edema of BLE, no pitting).  Neurological: She is alert and oriented to person, place, and time. She has normal reflexes.  No clonus  Skin: Skin is warm and dry. She is not diaphoretic.  Psychiatric: She has a normal mood and affect. Her behavior is normal. Judgment and thought content normal.   Fetal Tracing:  Baseline: 145 Variability: moderate Accelerations: 15x15 Decelerations: none  Toco: none MAU Course  Procedures Results for orders placed or performed during the hospital encounter of 09/21/16 (from the past 24 hour(s))  Urinalysis, Routine w reflex microscopic     Status: Abnormal   Collection Time: 09/21/16  1:20 PM  Result Value Ref Range   Color, Urine YELLOW YELLOW   APPearance HAZY (A) CLEAR   Specific Gravity, Urine 1.029 1.005 - 1.030   pH 6.0 5.0 - 8.0   Glucose, UA 50 (A) NEGATIVE mg/dL   Hgb urine dipstick NEGATIVE NEGATIVE   Bilirubin Urine  NEGATIVE NEGATIVE   Ketones, ur NEGATIVE NEGATIVE mg/dL   Protein, ur 30 (A) NEGATIVE mg/dL   Nitrite NEGATIVE NEGATIVE   Leukocytes, UA TRACE (A) NEGATIVE   RBC / HPF 0-5 0 - 5 RBC/hpf   WBC, UA 0-5 0 - 5 WBC/hpf   Bacteria, UA RARE (A) NONE SEEN   Squamous Epithelial / LPF 0-5 (A) NONE SEEN   Mucous PRESENT   Protein / creatinine ratio, urine     Status: None   Collection Time: 09/21/16  1:20 PM  Result Value Ref Range   Creatinine, Urine 203.00 mg/dL   Total Protein, Urine 10 mg/dL   Protein Creatinine Ratio 0.05 0.00 - 0.15 mg/mg[Cre]  CBC     Status: Abnormal  Collection Time: 09/21/16  2:18 PM  Result Value Ref Range   WBC 8.7 4.0 - 10.5 K/uL   RBC 3.90 3.87 - 5.11 MIL/uL   Hemoglobin 12.1 12.0 - 15.0 g/dL   HCT 35.0 (L) 36.0 - 46.0 %   MCV 89.7 78.0 - 100.0 fL   MCH 31.0 26.0 - 34.0 pg   MCHC 34.6 30.0 - 36.0 g/dL   RDW 13.2 11.5 - 15.5 %   Platelets 258 150 - 400 K/uL  Comprehensive metabolic panel     Status: Abnormal   Collection Time: 09/21/16  2:18 PM  Result Value Ref Range   Sodium 136 135 - 145 mmol/L   Potassium 3.6 3.5 - 5.1 mmol/L   Chloride 108 101 - 111 mmol/L   CO2 21 (L) 22 - 32 mmol/L   Glucose, Bld 96 65 - 99 mg/dL   BUN 10 6 - 20 mg/dL   Creatinine, Ser 0.52 0.44 - 1.00 mg/dL   Calcium 8.4 (L) 8.9 - 10.3 mg/dL   Total Protein 6.6 6.5 - 8.1 g/dL   Albumin 2.7 (L) 3.5 - 5.0 g/dL   AST 24 15 - 41 U/L   ALT 28 14 - 54 U/L   Alkaline Phosphatase 104 38 - 126 U/L   Total Bilirubin 0.2 (L) 0.3 - 1.2 mg/dL   GFR calc non Af Amer >60 >60 mL/min   GFR calc Af Amer >60 >60 mL/min   Anion gap 7 5 - 15    MDM Reactive fetal tracing Pt remains normotensive throughout visit CBC, CMP, urine PCR Pt currently asymptomatic, normotensive, and has minimal swelling. PIH labs WNL with PCR 0.05. Pt has next appt with Dr. Elonda Husky on Tuesday.  Assessment and Plan  A; 1. Chronic hypertension in pregnancy   2. Edema during pregnancy in third trimester     P: Discharge home Discussed reasons to return to [facility] including s/s preeclampsia Keep f/u with OB or call office as needed  Jorje Guild 09/21/2016, 2:21 PM

## 2016-09-21 NOTE — MAU Note (Signed)
C/o dizziness and swelling since 0100  today; having mild cramping since Friday morning and states that she lost "her mucous plug"; gestational diabetes and on oral meds; concerned about her BP; took BPs at home and states that they were elevated;

## 2016-09-24 ENCOUNTER — Ambulatory Visit (INDEPENDENT_AMBULATORY_CARE_PROVIDER_SITE_OTHER): Payer: Medicaid Other

## 2016-09-24 ENCOUNTER — Ambulatory Visit (INDEPENDENT_AMBULATORY_CARE_PROVIDER_SITE_OTHER): Payer: Medicaid Other | Admitting: Obstetrics & Gynecology

## 2016-09-24 ENCOUNTER — Encounter: Payer: Self-pay | Admitting: Obstetrics & Gynecology

## 2016-09-24 VITALS — Wt 232.4 lb

## 2016-09-24 VITALS — BP 140/90 | HR 78 | Wt 232.4 lb

## 2016-09-24 DIAGNOSIS — O0993 Supervision of high risk pregnancy, unspecified, third trimester: Secondary | ICD-10-CM | POA: Diagnosis not present

## 2016-09-24 DIAGNOSIS — O0990 Supervision of high risk pregnancy, unspecified, unspecified trimester: Secondary | ICD-10-CM

## 2016-09-24 DIAGNOSIS — O10919 Unspecified pre-existing hypertension complicating pregnancy, unspecified trimester: Secondary | ICD-10-CM

## 2016-09-24 DIAGNOSIS — O24419 Gestational diabetes mellitus in pregnancy, unspecified control: Secondary | ICD-10-CM

## 2016-09-24 DIAGNOSIS — Z331 Pregnant state, incidental: Secondary | ICD-10-CM | POA: Diagnosis not present

## 2016-09-24 DIAGNOSIS — Z3A37 37 weeks gestation of pregnancy: Secondary | ICD-10-CM

## 2016-09-24 DIAGNOSIS — Z1389 Encounter for screening for other disorder: Secondary | ICD-10-CM | POA: Diagnosis not present

## 2016-09-24 DIAGNOSIS — O10913 Unspecified pre-existing hypertension complicating pregnancy, third trimester: Secondary | ICD-10-CM | POA: Diagnosis not present

## 2016-09-24 LAB — POCT URINALYSIS DIPSTICK
Leukocytes, UA: NEGATIVE
Nitrite, UA: NEGATIVE
RBC UA: NEGATIVE

## 2016-09-24 NOTE — Progress Notes (Signed)
Fetal Surveillance Testing today:  BPP 8/8 with good Doppler flow ratios   High Risk Pregnancy Diagnosis(es):   Class A2 DM, CHTN no meds  G2P1001 [redacted]w[redacted]d Estimated Date of Delivery: 10/13/16  Blood pressure 140/90, pulse 78, weight 232 lb 6.4 oz (105.4 kg), last menstrual period 01/07/2016, unknown if currently breastfeeding.  Urinalysis: Negative   HPI: The patient is being seen today for ongoing management of as above. Today she reports CBG are good, BP at home also good   BP weight and urine results all reviewed and noted. Patient reports good fetal movement, denies any bleeding and no rupture of membranes symptoms or regular contractions.  Fundal Height:  39 Fetal Heart rate:  145 Edema:  none  Patient is without complaints other than noted in her HPI. All questions were answered.  All lab and sonogram results have been reviewed. Comments:    Assessment:  1.  Pregnancy at [redacted]w[redacted]d,  Estimated Date of Delivery: 10/13/16 :                          2.  Class A2 DM                        3.  CHTN, no meds required  Medication(s) Plans: metformin 1000 BID, glyburide 5 pm     Twice weekly surveillance, sonogram alternating with NST, induction at 39 weeks or as clinically indicated   Treatment Plan:  As above  Return in about 3 days (around 09/27/2016) for NST, HROB. for appointment for high risk OB care  No orders of the defined types were placed in this encounter.  No orders of the defined types were placed in this encounter.

## 2016-09-24 NOTE — Progress Notes (Signed)
[place] [id number] wks,cephalic,ant pl gr 3,RI .0000000 ov's bilat,afi 9.4 cm,BPP 8/8,FHR 169 bpm

## 2016-09-27 ENCOUNTER — Encounter: Payer: Self-pay | Admitting: Obstetrics and Gynecology

## 2016-09-27 ENCOUNTER — Ambulatory Visit (INDEPENDENT_AMBULATORY_CARE_PROVIDER_SITE_OTHER): Payer: Medicaid Other | Admitting: Obstetrics and Gynecology

## 2016-09-27 VITALS — BP 144/90 | HR 88 | Wt 235.0 lb

## 2016-09-27 DIAGNOSIS — Z1389 Encounter for screening for other disorder: Secondary | ICD-10-CM

## 2016-09-27 DIAGNOSIS — O10913 Unspecified pre-existing hypertension complicating pregnancy, third trimester: Secondary | ICD-10-CM | POA: Diagnosis not present

## 2016-09-27 DIAGNOSIS — O24419 Gestational diabetes mellitus in pregnancy, unspecified control: Secondary | ICD-10-CM

## 2016-09-27 DIAGNOSIS — O0993 Supervision of high risk pregnancy, unspecified, third trimester: Secondary | ICD-10-CM

## 2016-09-27 DIAGNOSIS — Z331 Pregnant state, incidental: Secondary | ICD-10-CM

## 2016-09-27 DIAGNOSIS — Z3A38 38 weeks gestation of pregnancy: Secondary | ICD-10-CM | POA: Diagnosis not present

## 2016-09-27 LAB — POCT URINALYSIS DIPSTICK
Blood, UA: NEGATIVE
GLUCOSE UA: NEGATIVE
KETONES UA: NEGATIVE
Nitrite, UA: NEGATIVE
PROTEIN UA: NEGATIVE

## 2016-09-27 NOTE — Progress Notes (Addendum)
Marissa Lane is a 26 y.o. female  Fetal Surveillance Testing today:  NST   High Risk Pregnancy Diagnosis(es):  A2DM, CHTN- no meds  G2P1001 [redacted]w[redacted]d Estimated Date of Delivery: 10/13/16  Blood pressure (!) 144/90, pulse 88, weight 235 lb (106.6 kg), last menstrual period 01/07/2016.  Urinalysis: Positive for trace leukocytes    HPI: The patient is being seen today for ongoing management of A2 dM, chtn not on meds til now. Today she reports slight increase in mucus, no rom,    BP weight and urine results all reviewed and noted. Patient reports good fetal movement, denies any bleeding and no rupture of membranes symptoms or regular contractions.  Fundal Height:  41 Fetal Heart rate:  145 Edema:  tr  Patient is without complaints other than noted in her HPI. All questions were answered.  All lab and sonogram results have been reviewed. Comments: normal  fstings 78-108                   2 hr pc's  120-130, highest 154. Was seem at [facility]U   Assessment:  1.  Pregnancy at [redacted]w[redacted]d,  Estimated Date of Delivery: 10/13/16 :  A2 DM                         2.  Chtn slight worsening, negative protein                        3.  IOL scheduled. For 3/18  Medication(s) Plans:  Add labetalol 100 bid  Treatment Plan:  Labetalol 100 bid, IOL 3/18  No Follow-up on file. for appointment for high risk OB care  No orders of the defined types were placed in this encounter.  Orders Placed This Encounter  Procedures  . POCT urinalysis dipstick   By signing my name below, I, Evelene Croon, attest that this documentation has been prepared under the direction and in the presence of Jonnie Kind, MD . Electronically Signed: Evelene Croon, Scribe. 09/27/2016. 10:17 AM. I personally performed the services described in this documentation, which was SCRIBED in my presence. The recorded information has been reviewed and considered accurate. It has been edited as necessary during review. Jonnie Kind,  MD

## 2016-09-30 ENCOUNTER — Encounter (HOSPITAL_COMMUNITY): Payer: Self-pay | Admitting: *Deleted

## 2016-09-30 ENCOUNTER — Telehealth (HOSPITAL_COMMUNITY): Payer: Self-pay | Admitting: *Deleted

## 2016-09-30 ENCOUNTER — Other Ambulatory Visit: Payer: Self-pay | Admitting: Obstetrics and Gynecology

## 2016-09-30 DIAGNOSIS — O24410 Gestational diabetes mellitus in pregnancy, diet controlled: Secondary | ICD-10-CM

## 2016-09-30 DIAGNOSIS — O10919 Unspecified pre-existing hypertension complicating pregnancy, unspecified trimester: Secondary | ICD-10-CM

## 2016-09-30 NOTE — Telephone Encounter (Signed)
Preadmission screen  

## 2016-10-01 ENCOUNTER — Inpatient Hospital Stay (HOSPITAL_COMMUNITY)
Admission: AD | Admit: 2016-10-01 | Discharge: 2016-10-02 | Disposition: A | Discharge status: Home / Self Care | Payer: Medicaid Other | Source: Ambulatory Visit | Attending: Family Medicine | Admitting: Family Medicine

## 2016-10-01 ENCOUNTER — Other Ambulatory Visit: Payer: Medicaid Other

## 2016-10-01 ENCOUNTER — Encounter: Payer: Medicaid Other | Admitting: Obstetrics & Gynecology

## 2016-10-01 ENCOUNTER — Ambulatory Visit (INDEPENDENT_AMBULATORY_CARE_PROVIDER_SITE_OTHER): Payer: Medicaid Other

## 2016-10-01 ENCOUNTER — Encounter (HOSPITAL_COMMUNITY): Payer: Self-pay | Admitting: *Deleted

## 2016-10-01 ENCOUNTER — Ambulatory Visit (INDEPENDENT_AMBULATORY_CARE_PROVIDER_SITE_OTHER): Payer: Medicaid Other | Admitting: Obstetrics & Gynecology

## 2016-10-01 ENCOUNTER — Encounter: Payer: Self-pay | Admitting: Obstetrics & Gynecology

## 2016-10-01 VITALS — BP 130/90 | HR 72 | Wt 238.0 lb

## 2016-10-01 DIAGNOSIS — O24410 Gestational diabetes mellitus in pregnancy, diet controlled: Secondary | ICD-10-CM

## 2016-10-01 DIAGNOSIS — O10913 Unspecified pre-existing hypertension complicating pregnancy, third trimester: Secondary | ICD-10-CM

## 2016-10-01 DIAGNOSIS — O471 False labor at or after 37 completed weeks of gestation: Secondary | ICD-10-CM | POA: Diagnosis not present

## 2016-10-01 DIAGNOSIS — Z3A38 38 weeks gestation of pregnancy: Secondary | ICD-10-CM

## 2016-10-01 DIAGNOSIS — Z1389 Encounter for screening for other disorder: Secondary | ICD-10-CM | POA: Diagnosis not present

## 2016-10-01 DIAGNOSIS — O10919 Unspecified pre-existing hypertension complicating pregnancy, unspecified trimester: Secondary | ICD-10-CM

## 2016-10-01 DIAGNOSIS — Z331 Pregnant state, incidental: Secondary | ICD-10-CM

## 2016-10-01 DIAGNOSIS — O0993 Supervision of high risk pregnancy, unspecified, third trimester: Secondary | ICD-10-CM

## 2016-10-01 DIAGNOSIS — O0990 Supervision of high risk pregnancy, unspecified, unspecified trimester: Secondary | ICD-10-CM

## 2016-10-01 DIAGNOSIS — O479 False labor, unspecified: Secondary | ICD-10-CM

## 2016-10-01 DIAGNOSIS — O24419 Gestational diabetes mellitus in pregnancy, unspecified control: Secondary | ICD-10-CM | POA: Diagnosis not present

## 2016-10-01 LAB — POCT URINALYSIS DIPSTICK
GLUCOSE UA: NEGATIVE
KETONES UA: NEGATIVE
Leukocytes, UA: NEGATIVE
Nitrite, UA: NEGATIVE
RBC UA: NEGATIVE

## 2016-10-01 NOTE — MAU Note (Signed)
PT  SAYS  AT 750PM-   FEELING  UC  Q 4  MIN.  PNC-  FAMILY [facility]-   VE  -  2 WEEKS  AGO   1  CM.    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  DENIES HSV.     GBS- POSITIVE.

## 2016-10-01 NOTE — Progress Notes (Signed)
Fetal Surveillance Testing today:  BPP 8/8 with excellent Doppler flow ratios   High Risk Pregnancy Diagnosis(es):   Class A2DM, CHTN  G2P1001 [redacted]w[redacted]d Estimated Date of Delivery: 10/13/16  Blood pressure 130/90, pulse 72, weight 238 lb (108 kg), last menstrual period 01/07/2016.  Urinalysis: Negative   HPI: The patient is being seen today for ongoing management of as above. Today she reports CBG are decent some a little high   BP weight and urine results all reviewed and noted. Patient reports good fetal movement, denies any bleeding and no rupture of membranes symptoms or regular contractions.  Fundal Height:  40 Fetal Heart rate:  144 Edema:  none  Patient is without complaints other than noted in her HPI. All questions were answered.  All lab and sonogram results have been reviewed. Comments:    Assessment:  1.  Pregnancy at [redacted]w[redacted]d,  Estimated Date of Delivery: 10/13/16 :                          2.  Class A2 DM                        3.  CHTN, no meds  Medication(s) Plans:  Metformin 1000 BID  Treatment Plan:  Induction 3/18@0730   Return in about 3 days (around 10/04/2016) for NST, HROB. for appointment for high risk OB care  No orders of the defined types were placed in this encounter.  Orders Placed This Encounter  Procedures  . POCT urinalysis dipstick

## 2016-10-01 NOTE — Progress Notes (Signed)
[place] 97+9 wks,cephalic,ant pl gr 3,fhr 150 bpm,bilat adnexa's wnl,BPP 8/8,RI .55,.61  64%,AFI 10.5 cm,EFW 3337 g 56%

## 2016-10-02 ENCOUNTER — Encounter (HOSPITAL_COMMUNITY): Payer: Self-pay | Admitting: *Deleted

## 2016-10-02 DIAGNOSIS — O471 False labor at or after 37 completed weeks of gestation: Secondary | ICD-10-CM | POA: Diagnosis not present

## 2016-10-02 LAB — URINALYSIS, ROUTINE W REFLEX MICROSCOPIC
BILIRUBIN URINE: NEGATIVE
GLUCOSE, UA: NEGATIVE mg/dL
Hgb urine dipstick: NEGATIVE
Ketones, ur: NEGATIVE mg/dL
NITRITE: NEGATIVE
PH: 6 (ref 5.0–8.0)
Protein, ur: NEGATIVE mg/dL
SPECIFIC GRAVITY, URINE: 1.008 (ref 1.005–1.030)

## 2016-10-02 NOTE — OB Triage Note (Signed)
I have communicated with [name], cnm and reviewed vital signs:  Vitals:   10/01/16 2349  BP: 139/89  Pulse: 105  Resp: 20  Temp: 98.4 F (36.9 C)    Vaginal exam:  Dilation:  (pt wants to ambulate and recheck, she leaves 96mins away),   Also reviewed contraction pattern and that non-stress test is reactive.  It has been documented that patient is contracting every 2-3 minutes with no cervical change over 2 hours not indicating active labor.  Patient denies any other complaints.  Based on this report provider has given order for discharge.  A discharge order and diagnosis entered by a provider.   Labor discharge instructions reviewed with patient.      Note written by Glenice Bow, rnc

## 2016-10-02 NOTE — Discharge Instructions (Signed)

## 2016-10-03 ENCOUNTER — Other Ambulatory Visit: Payer: Self-pay | Admitting: Obstetrics and Gynecology

## 2016-10-04 ENCOUNTER — Ambulatory Visit (INDEPENDENT_AMBULATORY_CARE_PROVIDER_SITE_OTHER): Payer: Medicaid Other | Admitting: Obstetrics and Gynecology

## 2016-10-04 ENCOUNTER — Encounter: Payer: Self-pay | Admitting: Obstetrics and Gynecology

## 2016-10-04 VITALS — BP 130/90 | HR 76 | Wt 235.0 lb

## 2016-10-04 DIAGNOSIS — Z331 Pregnant state, incidental: Secondary | ICD-10-CM | POA: Diagnosis not present

## 2016-10-04 DIAGNOSIS — O24419 Gestational diabetes mellitus in pregnancy, unspecified control: Secondary | ICD-10-CM

## 2016-10-04 DIAGNOSIS — Z1389 Encounter for screening for other disorder: Secondary | ICD-10-CM

## 2016-10-04 DIAGNOSIS — O0993 Supervision of high risk pregnancy, unspecified, third trimester: Secondary | ICD-10-CM | POA: Diagnosis not present

## 2016-10-04 DIAGNOSIS — O10919 Unspecified pre-existing hypertension complicating pregnancy, unspecified trimester: Secondary | ICD-10-CM

## 2016-10-04 LAB — POCT URINALYSIS DIPSTICK
GLUCOSE UA: NEGATIVE
Ketones, UA: NEGATIVE
Leukocytes, UA: NEGATIVE
NITRITE UA: NEGATIVE
PROTEIN UA: 1
RBC UA: NEGATIVE

## 2016-10-04 NOTE — Progress Notes (Signed)
Marissa Lane is a 26 y.o. female  Fetal Surveillance Testing today:  NST  High Risk Pregnancy Diagnosis(es):   Class A2DM, CHTN  G2P1001 [redacted]w[redacted]d Estimated Date of Delivery: 10/13/16  Last menstrual period 01/07/2016.  Urinalysis: Positive for protein 1+   HPI: The patient is being seen today for ongoing management of the above. Today she has no acute complaints or symptoms.   BP weight and urine results all reviewed and noted. Patient reports good fetal movement, denies any bleeding and no rupture of membranes symptoms or regular contractions.  Fundal Height:  38 cm Fetal Heart rate:  150 bpm per NST Edema: trace Cervix examined and cervix stretched.   Patient is without complaints other than noted in her HPI. All questions were answered.  All lab and sonogram results have been reviewed. Comments: NST reactive  Assessment:  1.  Pregnancy at [redacted]w[redacted]d,  Estimated Date of Delivery: 10/06/16                        Medication(s) Plans:  Labetalol 100 bid; metformin and glyburide  Treatment Plan:  Scheduled for induction of labor on 10/06/2016 at 0700    Follow-up in 4 weeks for postpartum appointment  No orders of the defined types were placed in this encounter.  Orders Placed This Encounter  Procedures  . POCT urinalysis dipstick   By signing my name below, I, Evelene Croon, attest that this documentation has been prepared under the direction and in the presence of Jonnie Kind, MD . Electronically Signed: Evelene Croon, Scribe. 10/04/2016. 11:32 AM. I personally performed the services described in this documentation, which was SCRIBED in my presence. The recorded information has been reviewed and considered accurate. It has been edited as necessary during review. Jonnie Kind, MD

## 2016-10-06 ENCOUNTER — Inpatient Hospital Stay (HOSPITAL_COMMUNITY)
Admission: RE | Admit: 2016-10-06 | Discharge: 2016-10-08 | DRG: 774 | Disposition: A | Discharge status: Home / Self Care | Payer: Medicaid Other | Source: Ambulatory Visit | Attending: Obstetrics and Gynecology | Admitting: Obstetrics and Gynecology

## 2016-10-06 ENCOUNTER — Encounter (HOSPITAL_COMMUNITY): Payer: Self-pay

## 2016-10-06 ENCOUNTER — Inpatient Hospital Stay (HOSPITAL_COMMUNITY): Payer: Medicaid Other | Admitting: Anesthesiology

## 2016-10-06 DIAGNOSIS — O24425 Gestational diabetes mellitus in childbirth, controlled by oral hypoglycemic drugs: Principal | ICD-10-CM | POA: Diagnosis present

## 2016-10-06 DIAGNOSIS — O99334 Smoking (tobacco) complicating childbirth: Secondary | ICD-10-CM | POA: Diagnosis present

## 2016-10-06 DIAGNOSIS — O1002 Pre-existing essential hypertension complicating childbirth: Secondary | ICD-10-CM | POA: Diagnosis present

## 2016-10-06 DIAGNOSIS — F17210 Nicotine dependence, cigarettes, uncomplicated: Secondary | ICD-10-CM | POA: Diagnosis present

## 2016-10-06 DIAGNOSIS — Z8249 Family history of ischemic heart disease and other diseases of the circulatory system: Secondary | ICD-10-CM

## 2016-10-06 DIAGNOSIS — Z3A39 39 weeks gestation of pregnancy: Secondary | ICD-10-CM

## 2016-10-06 DIAGNOSIS — O0990 Supervision of high risk pregnancy, unspecified, unspecified trimester: Secondary | ICD-10-CM

## 2016-10-06 DIAGNOSIS — O24419 Gestational diabetes mellitus in pregnancy, unspecified control: Secondary | ICD-10-CM

## 2016-10-06 DIAGNOSIS — K219 Gastro-esophageal reflux disease without esophagitis: Secondary | ICD-10-CM | POA: Diagnosis present

## 2016-10-06 DIAGNOSIS — Z6841 Body Mass Index (BMI) 40.0 and over, adult: Secondary | ICD-10-CM

## 2016-10-06 DIAGNOSIS — O99214 Obesity complicating childbirth: Secondary | ICD-10-CM | POA: Diagnosis present

## 2016-10-06 DIAGNOSIS — O99824 Streptococcus B carrier state complicating childbirth: Secondary | ICD-10-CM | POA: Diagnosis present

## 2016-10-06 DIAGNOSIS — E669 Obesity, unspecified: Secondary | ICD-10-CM | POA: Diagnosis present

## 2016-10-06 DIAGNOSIS — O10919 Unspecified pre-existing hypertension complicating pregnancy, unspecified trimester: Secondary | ICD-10-CM

## 2016-10-06 DIAGNOSIS — O9962 Diseases of the digestive system complicating childbirth: Secondary | ICD-10-CM | POA: Diagnosis present

## 2016-10-06 DIAGNOSIS — I10 Essential (primary) hypertension: Secondary | ICD-10-CM

## 2016-10-06 LAB — CBC
HEMATOCRIT: 36.7 % (ref 36.0–46.0)
HEMATOCRIT: 36 % (ref 36.0–46.0)
HEMOGLOBIN: 12.8 g/dL (ref 12.0–15.0)
Hemoglobin: 12.5 g/dL (ref 12.0–15.0)
MCH: 31.3 pg (ref 26.0–34.0)
MCH: 31 pg (ref 26.0–34.0)
MCHC: 34.9 g/dL (ref 30.0–36.0)
MCHC: 34.7 g/dL (ref 30.0–36.0)
MCV: 89.7 fL (ref 78.0–100.0)
MCV: 89.3 fL (ref 78.0–100.0)
PLATELETS: 242 10*3/uL (ref 150–400)
Platelets: 243 10*3/uL (ref 150–400)
RBC: 4.09 MIL/uL (ref 3.87–5.11)
RBC: 4.03 MIL/uL (ref 3.87–5.11)
RDW: 13 % (ref 11.5–15.5)
RDW: 13.1 % (ref 11.5–15.5)
WBC: 9.3 10*3/uL (ref 4.0–10.5)
WBC: 16.4 10*3/uL — AB (ref 4.0–10.5)

## 2016-10-06 LAB — TYPE AND SCREEN
ABO/RH(D): A POS
Antibody Screen: NEGATIVE

## 2016-10-06 LAB — GLUCOSE, CAPILLARY
GLUCOSE-CAPILLARY: 92 mg/dL (ref 65–99)
GLUCOSE-CAPILLARY: 87 mg/dL (ref 65–99)
Glucose-Capillary: 106 mg/dL — ABNORMAL HIGH (ref 65–99)

## 2016-10-06 LAB — ABO/RH: ABO/RH(D): A POS

## 2016-10-06 MED ORDER — PHENYLEPHRINE 40 MCG/ML (10ML) SYRINGE FOR IV PUSH (FOR BLOOD PRESSURE SUPPORT)
80.0000 ug | PREFILLED_SYRINGE | INTRAVENOUS | Status: DC | PRN
  Filled 2016-10-06: qty 5
  Filled 2016-10-06: qty 10

## 2016-10-06 MED ORDER — SENNOSIDES-DOCUSATE SODIUM 8.6-50 MG PO TABS
2.0000 | ORAL_TABLET | ORAL | Status: DC
  Administered 2016-10-06 – 2016-10-08 (×2): 2 via ORAL
  Filled 2016-10-06 (×2): qty 2

## 2016-10-06 MED ORDER — LIDOCAINE HCL (PF) 1 % IJ SOLN
30.0000 mL | INTRAMUSCULAR | Status: DC | PRN
  Filled 2016-10-06: qty 30

## 2016-10-06 MED ORDER — WITCH HAZEL-GLYCERIN EX PADS: 1.0000 "application " | MEDICATED_PAD | CUTANEOUS | Status: DC | PRN

## 2016-10-06 MED ORDER — TETANUS-DIPHTH-ACELL PERTUSSIS 5-2.5-18.5 LF-MCG/0.5 IM SUSP: 0.5000 mL | Freq: Once | INTRAMUSCULAR | Status: DC

## 2016-10-06 MED ORDER — LACTATED RINGERS IV SOLN
INTRAVENOUS | Status: DC
  Administered 2016-10-06: 08:00:00 via INTRAVENOUS

## 2016-10-06 MED ORDER — TERBUTALINE SULFATE 1 MG/ML IJ SOLN
0.2500 mg | Freq: Once | INTRAMUSCULAR | Status: DC | PRN
  Filled 2016-10-06: qty 1

## 2016-10-06 MED ORDER — OXYCODONE-ACETAMINOPHEN 5-325 MG PO TABS: 2.0000 | ORAL_TABLET | ORAL | Status: DC | PRN

## 2016-10-06 MED ORDER — EPHEDRINE 5 MG/ML INJ
10.0000 mg | INTRAVENOUS | Status: DC | PRN
  Filled 2016-10-06: qty 2

## 2016-10-06 MED ORDER — DIBUCAINE 1 % RE OINT: 1.0000 "application " | TOPICAL_OINTMENT | RECTAL | Status: DC | PRN

## 2016-10-06 MED ORDER — LACTATED RINGERS IV SOLN
500.0000 mL | Freq: Once | INTRAVENOUS | Status: AC
  Administered 2016-10-06: 500 mL via INTRAVENOUS

## 2016-10-06 MED ORDER — BENZOCAINE-MENTHOL 20-0.5 % EX AERO: 1.0000 "application " | INHALATION_SPRAY | CUTANEOUS | Status: DC | PRN

## 2016-10-06 MED ORDER — PENICILLIN G POTASSIUM 5000000 UNITS IJ SOLR
5.0000 10*6.[IU] | Freq: Once | INTRAMUSCULAR | Status: AC
  Administered 2016-10-06: 5 10*6.[IU] via INTRAVENOUS
  Filled 2016-10-06: qty 5

## 2016-10-06 MED ORDER — MISOPROSTOL 25 MCG QUARTER TABLET: 25.0000 ug | ORAL_TABLET | ORAL | Status: DC | PRN

## 2016-10-06 MED ORDER — HYDROCORTISONE ACE-PRAMOXINE 1-1 % RE FOAM
1.0000 | Freq: Two times a day (BID) | RECTAL | Status: DC
  Administered 2016-10-07: 1 via RECTAL
  Filled 2016-10-06: qty 10

## 2016-10-06 MED ORDER — PHENYLEPHRINE 40 MCG/ML (10ML) SYRINGE FOR IV PUSH (FOR BLOOD PRESSURE SUPPORT)
80.0000 ug | PREFILLED_SYRINGE | INTRAVENOUS | Status: DC | PRN
  Filled 2016-10-06: qty 5

## 2016-10-06 MED ORDER — DIPHENHYDRAMINE HCL 50 MG/ML IJ SOLN: 12.5000 mg | INTRAMUSCULAR | Status: DC | PRN

## 2016-10-06 MED ORDER — COCONUT OIL OIL
1.0000 "application " | TOPICAL_OIL | Status: DC | PRN
  Filled 2016-10-06: qty 120

## 2016-10-06 MED ORDER — PENICILLIN G POT IN DEXTROSE 60000 UNIT/ML IV SOLN
3.0000 10*6.[IU] | INTRAVENOUS | Status: DC
  Administered 2016-10-06 (×2): 3 10*6.[IU] via INTRAVENOUS
  Filled 2016-10-06 (×4): qty 50

## 2016-10-06 MED ORDER — SIMETHICONE 80 MG PO CHEW: 80.0000 mg | CHEWABLE_TABLET | ORAL | Status: DC | PRN

## 2016-10-06 MED ORDER — ACETAMINOPHEN 325 MG PO TABS: 650.0000 mg | ORAL_TABLET | ORAL | Status: DC | PRN

## 2016-10-06 MED ORDER — SODIUM CHLORIDE 0.9% FLUSH: 3.0000 mL | Freq: Two times a day (BID) | INTRAVENOUS | Status: DC

## 2016-10-06 MED ORDER — OXYTOCIN 40 UNITS IN LACTATED RINGERS INFUSION - SIMPLE MED: 2.5000 [IU]/h | INTRAVENOUS | Status: DC

## 2016-10-06 MED ORDER — PRENATAL MULTIVITAMIN CH
1.0000 | ORAL_TABLET | Freq: Every day | ORAL | Status: DC
  Administered 2016-10-07 – 2016-10-08 (×2): 1 via ORAL
  Filled 2016-10-06 (×2): qty 1

## 2016-10-06 MED ORDER — IBUPROFEN 600 MG PO TABS
600.0000 mg | ORAL_TABLET | Freq: Four times a day (QID) | ORAL | Status: DC
  Administered 2016-10-06 – 2016-10-08 (×7): 600 mg via ORAL
  Filled 2016-10-06 (×7): qty 1

## 2016-10-06 MED ORDER — MEASLES, MUMPS & RUBELLA VAC ~~LOC~~ INJ: 0.5000 mL | INJECTION | Freq: Once | SUBCUTANEOUS | Status: DC

## 2016-10-06 MED ORDER — ZOLPIDEM TARTRATE 5 MG PO TABS: 5.0000 mg | ORAL_TABLET | Freq: Every evening | ORAL | Status: DC | PRN

## 2016-10-06 MED ORDER — SODIUM CHLORIDE 0.9 % IV SOLN: 250.0000 mL | INTRAVENOUS | Status: DC | PRN

## 2016-10-06 MED ORDER — LACTATED RINGERS IV SOLN: 500.0000 mL | INTRAVENOUS | Status: DC | PRN

## 2016-10-06 MED ORDER — OXYTOCIN BOLUS FROM INFUSION
500.0000 mL | Freq: Once | INTRAVENOUS | Status: AC
  Administered 2016-10-06: 500 mL via INTRAVENOUS

## 2016-10-06 MED ORDER — FENTANYL CITRATE (PF) 100 MCG/2ML IJ SOLN
100.0000 ug | INTRAMUSCULAR | Status: DC | PRN
  Administered 2016-10-06: 100 ug via INTRAVENOUS
  Filled 2016-10-06: qty 2

## 2016-10-06 MED ORDER — SODIUM CHLORIDE 0.9% FLUSH: 3.0000 mL | INTRAVENOUS | Status: DC | PRN

## 2016-10-06 MED ORDER — OXYTOCIN 40 UNITS IN LACTATED RINGERS INFUSION - SIMPLE MED
1.0000 m[IU]/min | INTRAVENOUS | Status: DC
  Administered 2016-10-06: 2 m[IU]/min via INTRAVENOUS
  Filled 2016-10-06: qty 1000

## 2016-10-06 MED ORDER — LIDOCAINE HCL (PF) 1 % IJ SOLN
INTRAMUSCULAR | Status: DC | PRN
  Administered 2016-10-06: 4 mL
  Administered 2016-10-06: 6 mL via EPIDURAL

## 2016-10-06 MED ORDER — FENTANYL 2.5 MCG/ML BUPIVACAINE 1/10 % EPIDURAL INFUSION (WH - ANES)
14.0000 mL/h | INTRAMUSCULAR | Status: DC | PRN
  Administered 2016-10-06: 14 mL/h via EPIDURAL
  Filled 2016-10-06: qty 100

## 2016-10-06 MED ORDER — DIPHENHYDRAMINE HCL 25 MG PO CAPS
25.0000 mg | ORAL_CAPSULE | Freq: Four times a day (QID) | ORAL | Status: DC | PRN
Start: 2016-10-06 — End: 2016-10-08

## 2016-10-06 MED ORDER — OXYCODONE-ACETAMINOPHEN 5-325 MG PO TABS: 1.0000 | ORAL_TABLET | ORAL | Status: DC | PRN

## 2016-10-06 MED ORDER — SOD CITRATE-CITRIC ACID 500-334 MG/5ML PO SOLN: 30.0000 mL | ORAL | Status: DC | PRN

## 2016-10-06 NOTE — H&P (Signed)
Marissa Lane is a 26 y.o. female G2P1001 @ 64 wks  presenting for IOL for GDM on metformin. GBS pos. OB History    Gravida Para Term Preterm AB Living   2 1 1     1    SAB TAB Ectopic Multiple Live Births           1     Past Medical History:  Diagnosis Date  . Acne   . Chest pain   . Chronic hypertension   . Diabetes mellitus without complication ([place])   . Dysthymic disorder    over dose age 51  . GERD (gastroesophageal reflux disease)   . Gestational diabetes    metformin  . H/O urinary frequency 06/25/2013  . Irregular intermenstrual bleeding 10/12/2013  . Obesity   . Palpitations   . PCOS (polycystic ovarian syndrome)   . Postpartum depression   . Tobacco abuse    Past Surgical History:  Procedure Laterality Date  . CHOLECYSTECTOMY  03/06/2012   Procedure: LAPAROSCOPIC CHOLECYSTECTOMY;  Surgeon: Donato Heinz, MD;  Location: [facility];  Service: General;  Laterality: N/A;  . ESOPHAGOGASTRODUODENOSCOPY N/A 09/09/2012   Procedure: ESOPHAGOGASTRODUODENOSCOPY (EGD);  Surgeon: Rogene Houston, MD;  Location: [facility];  Service: Endoscopy;  Laterality: N/A;  340  . MOUTH SURGERY    . SKIN GRAFT  2009   gum graft  . TONSILLECTOMY     Family History: family history includes Anemia in her mother; Colon cancer (age of onset: 34) in her maternal grandmother; Congestive Heart Failure in her mother; Heart disease (age of onset: 26) in her maternal grandfather; Hypertension in her brother; Irritable bowel syndrome in her mother; Ulcers in her mother. She was adopted. Social History:  reports that she has been smoking Cigarettes.  She has a 2.50 pack-year smoking history. She has never used smokeless tobacco. She reports that she does not drink alcohol or use drugs.     Maternal Diabetes: Yes:  Diabetes Type:  Insulin/Medication controlled Genetic Screening: Normal Maternal Ultrasounds/Referrals: Normal Fetal Ultrasounds or other Referrals:  None Maternal Substance Abuse:   No Significant Maternal Medications:  None Significant Maternal Lab Results:  Lab values include: Group B Strep positive Other Comments:  None  Review of Systems  Constitutional: Negative.   HENT: Negative.   Eyes: Negative.   Respiratory: Negative.   Cardiovascular: Negative.   Gastrointestinal: Negative.   Genitourinary: Negative.   Musculoskeletal: Negative.   Skin: Negative.   Neurological: Negative.   Endo/Heme/Allergies: Negative.   Psychiatric/Behavioral: Negative.    Maternal Medical History:  Reason for admission: IOL for GDM  Fetal activity: Perceived fetal activity is normal.   Last perceived fetal movement was within the past hour.    Prenatal complications: no prenatal complications Prenatal Complications - Diabetes: gestational. Diabetes is managed by oral agent (monotherapy).      Dilation: 3 Effacement (%): 60 Station: -2 Exam by:: Sharyn Lull, RN  Blood pressure (!) 150/76, pulse (!) 102, temperature 98.3 F (36.8 C), temperature source Oral, resp. rate 20, height 5\' 4"  (1.626 m), weight 235 lb (106.6 kg), last menstrual period 01/07/2016. Maternal Exam:  Abdomen: Patient reports no abdominal tenderness. Fetal presentation: vertex  Introitus: Normal vulva. Normal vagina.  Ferning test: not done.  Nitrazine test: not done. Amniotic fluid character: not assessed.  Pelvis: adequate for delivery.   Cervix: Cervix evaluated by digital exam.     Fetal Exam Fetal Monitor Review: Mode: ultrasound.   Variability: moderate (6-25 bpm).  Pattern: accelerations present.    Fetal State Assessment: Category I - tracings are normal.     Physical Exam  Constitutional: She is oriented to person, place, and time. She appears well-developed and well-nourished.  HENT:  Head: Normocephalic.  Neck: Normal range of motion.  Cardiovascular: Normal rate, regular rhythm, normal heart sounds and intact distal pulses.   Respiratory: Effort normal and breath sounds  normal.  GI: Soft.  Genitourinary: Vagina normal and uterus normal.  Musculoskeletal: Normal range of motion.  Neurological: She is alert and oriented to person, place, and time. She has normal reflexes.  Skin: Skin is warm and dry.  Psychiatric: She has a normal mood and affect. Her behavior is normal. Judgment and thought content normal.    Prenatal labs: ABO, Rh: --/--/A POS (03/18 0825) Antibody: PENDING (03/18 0825) Rubella: 3.40 (08/17 1312) RPR: Non Reactive (12/28 0912)  HBsAg: Negative (08/17 1312)  HIV: Non Reactive (12/28 0912)  GBS: Positive (03/01 1630)   Assessment/Plan: IOL for GDM @ 39 wks SVE 3cm/70/-2. Will start pit induction of labor and GBS prophalaxis   Koren Shiver 10/06/2016, 9:11 AM

## 2016-10-06 NOTE — Anesthesia Pain Management Evaluation Note (Signed)
  CRNA Pain Management Visit Note  Patient: Marissa Lane, 26 y.o., female  "Hello I am a member of the anesthesia team at [facility]. We have an anesthesia team available at all times to provide care throughout the hospital, including epidural management and anesthesia for C-section. I don't know your plan for the delivery whether it a natural birth, water birth, IV sedation, nitrous supplementation, doula or epidural, but we want to meet your pain goals."   1.Was your pain managed to your expectations on prior hospitalizations?   Yes   2.What is your expectation for pain management during this hospitalization?     Epidural  3.How can we help you reach that goal? epidural  Record the patient's initial score and the patient's pain goal.   Pain: 0  Pain Goal: 5 [facility] wants you to be able to say your pain was always managed very well.  Willa Rough 10/06/2016

## 2016-10-06 NOTE — Progress Notes (Signed)
Marissa Lane is a 26 y.o. G2P1001 at [redacted]w[redacted]d by ultrasound admitted for induction of labor due to Gestational diabetes.  Subjective:   Objective: BP 128/89   Pulse 91   Temp 97.7 F (36.5 C)   Resp 18   Ht 5\' 4"  (1.626 m)   Wt 235 lb (106.6 kg)   LMP 01/07/2016 (Exact Date)   BMI 40.34 kg/m  No intake/output data recorded. No intake/output data recorded.  FHT:  FHR: 130's bpm, variability: moderate,  accelerations:  Present,  decelerations:  Absent UC:   regular, every 2 minutes SVE:   Dilation: 3 Effacement (%): 60 Station: -2 Exam by:: Sharyn Lull, RN   Labs: Lab Results  Component Value Date   WBC 9.3 10/06/2016   HGB 12.8 10/06/2016   HCT 36.7 10/06/2016   MCV 89.7 10/06/2016   PLT 243 10/06/2016    Assessment / Plan: Induction of labor due to gestational diabetes,  progressing well on pitocin  Labor: Progressing normally Preeclampsia:  no signs or symptoms of toxicity and intake and ouput balanced Fetal Wellbeing:  Category I Pain Control:  Labor support without medications I/D:  n/a Anticipated MOD:  NSVD  Koren Shiver 10/06/2016, 1:09 PM

## 2016-10-06 NOTE — Progress Notes (Signed)
S: questioned pt about pos PPD. Pt states she did not get it read on time so they did a repeat and it was positive. But had CXR per Dr. Sharlee Blew office and CXR was neg and was tole by Dr. Karin Golden that no further treatment was needed.

## 2016-10-06 NOTE — Anesthesia Preprocedure Evaluation (Signed)
Anesthesia Evaluation  Patient identified by MRN, date of birth, ID band Patient awake    Reviewed: Allergy & Precautions, H&P , Patient's Chart, lab work & pertinent test results  Airway Mallampati: II  TM Distance: >3 FB Neck ROM: full    Dental  (+) Teeth Intact   Pulmonary Current Smoker,    breath sounds clear to auscultation       Cardiovascular hypertension,  Rhythm:regular Rate:Normal     Neuro/Psych    GI/Hepatic   Endo/Other  diabetes  Renal/GU      Musculoskeletal   Abdominal   Peds  Hematology   Anesthesia Other Findings  GERD      Obesity     Diabetes mellitus without complication   Chronic hypertension          Reproductive/Obstetrics (+) Pregnancy                             Anesthesia Physical Anesthesia Plan  ASA: III  Anesthesia Plan: Epidural   Post-op Pain Management:    Induction:   Airway Management Planned:   Additional Equipment:   Intra-op Plan:   Post-operative Plan:   Informed Consent: I have reviewed the patients History and Physical, chart, labs and discussed the procedure including the risks, benefits and alternatives for the proposed anesthesia with the patient or authorized representative who has indicated his/her understanding and acceptance.   Dental Advisory Given  Plan Discussed with:   Anesthesia Plan Comments: (Labs checked- platelets confirmed with RN in room. Fetal heart tracing, per RN, reported to be stable enough for sitting procedure. Discussed epidural, and patient consents to the procedure:  included risk of possible headache,backache, failed block, allergic reaction, and nerve injury. This patient was asked if she had any questions or concerns before the procedure started.)        Anesthesia Quick Evaluation

## 2016-10-06 NOTE — Anesthesia Procedure Notes (Signed)
Epidural Patient location during procedure: OB  Staffing Anesthesiologist: Lyndle Herrlich  Preanesthetic Checklist Completed: patient identified, site marked, surgical consent, pre-op evaluation, timeout performed, IV checked, risks and benefits discussed and monitors and equipment checked  Epidural Patient position: sitting Prep: site prepped and draped and DuraPrep Patient monitoring: continuous pulse ox and blood pressure Approach: midline Location: L3-L4 Injection technique: LOR air  Needle:  Needle type: Tuohy  Needle gauge: 17 G Needle length: 9 cm and 9 Needle insertion depth: 8 cm Catheter type: closed end flexible Catheter size: 19 Gauge Catheter at skin depth: 15 cm Test dose: negative  Assessment Events: blood not aspirated, injection not painful, no injection resistance, negative IV test and no paresthesia  Additional Notes Dosing of Epidural:  1st dose, through catheter ............................................Marland Kitchen  Xylocaine 40 mg  2nd dose, through catheter, after waiting 3 minutes........Marland KitchenXylocaine 60 mg    As each dose occurred, patient was free of IV sx; and patient exhibited no evidence of SA injection.  Patient is more comfortable after epidural dosed. Please see RN's note for documentation of vital signs,and FHR which are stable.  Patient reminded not to try to ambulate with numb legs, and that an RN must be present when she attempts to get up.

## 2016-10-07 LAB — RPR: RPR Ser Ql: NONREACTIVE

## 2016-10-07 NOTE — Anesthesia Postprocedure Evaluation (Signed)
Anesthesia Post Note  Patient: Marissa Lane  Procedure(s) Performed: * No procedures listed *  Patient location during evaluation: Mother Baby Anesthesia Type: Epidural Level of consciousness: awake, awake and alert, oriented and patient cooperative Pain management: pain level controlled Vital Signs Assessment: post-procedure vital signs reviewed and stable Respiratory status: spontaneous breathing, nonlabored ventilation and respiratory function stable Cardiovascular status: stable Postop Assessment: no headache, no backache, patient able to bend at knees and no signs of nausea or vomiting Anesthetic complications: no        Last Vitals:  Vitals:   10/07/16 0144 10/07/16 0721  BP: 134/76 129/83  Pulse: 96 85  Resp: 16 18  Temp: 36.8 C 36.7 C    Last Pain:  Vitals:   10/07/16 0721  TempSrc: Oral  PainSc:    Pain Goal: Patients Stated Pain Goal: 5 (10/06/16 1447)               Kirstina Leinweber L

## 2016-10-07 NOTE — Progress Notes (Signed)
CSW acknowledges consult.  CSW attempted to meet with MOB, however MOB and FOB were asleep.  MOB requested that CSW return at a later time.  CSW will attempt to meet with MOB on tomorrow (10/08/16).   Laurey Arrow, MSW, LCSW Clinical Social Work [phone]

## 2016-10-07 NOTE — Progress Notes (Signed)
Post Partum Day#1 Subjective: no complaints, up ad lib, voiding, tolerating PO and reports normal lochia  Objective: Blood pressure 134/76, pulse 96, temperature 98.3 F (36.8 C), temperature source Oral, resp. rate 16, height 5\' 4"  (1.626 m), weight 106.6 kg (235 lb), last menstrual period 01/07/2016, SpO2 99 %, unknown if currently breastfeeding.  Physical Exam:  General: alert Lochia: appropriate Uterine Fundus: firm and NT at U-1 DVT Evaluation: No evidence of DVT seen on physical exam.   Recent Labs  10/06/16 0825 10/06/16 1949  HGB 12.8 12.5  HCT 36.7 36.0    Assessment/Plan: Plan for discharge tomorrow   LOS: 1 day   Emily Filbert 10/07/2016, 6:33 AM

## 2016-10-08 MED ORDER — IBUPROFEN 600 MG PO TABS: 600.0000 mg | ORAL_TABLET | Freq: Four times a day (QID) | ORAL | 0 refills | Status: DC

## 2016-10-08 NOTE — Discharge Summary (Addendum)
OB Discharge Summary  Patient Name: Marissa Lane DOB: 12/04/1990 MRN: [id number]  Date of admission: 10/06/2016 Delivering MD: Koren Shiver D   Date of discharge: 10/08/2016  Admitting diagnosis: IOL  Intrauterine pregnancy: [redacted]w[redacted]d     Secondary diagnosis:Active Problems:   Gestational diabetes  Additional problems:obesity     Discharge diagnosis: Term Pregnancy Delivered and GDM A2                                                                      Complications: None HOSPITAL COURSE:  She was admitted at 39 weeks with GDM A2. She was also treated for +GBS.  Membrane Rupture Time/Date: 4:49 PM ,10/06/2016   Patient delivered a Viable infant.10/06/2016   [name] had an uncomplicated postpartum course.  She is ambulating, tolerating a regular diet, passing flatus, and urinating well. Patient is discharged home in stable condition on  10/08/16         Physical exam  Vitals:   10/07/16 0144 10/07/16 0721 10/07/16 1813 10/08/16 0600  BP: 134/76 129/83 131/86 113/66  Pulse: 96 85 84 68  Resp: 16 18 18 18   Temp: 98.3 F (36.8 C) 98.1 F (36.7 C) 98 F (36.7 C) 98.1 F (36.7 C)  TempSrc: Oral Oral Oral Oral  SpO2: 99%     Weight:      Height:       General: alert Lochia: appropriate Uterine Fundus: firm Incision: Dressing is clean, dry, and intact DVT Evaluation: No evidence of DVT seen on physical exam. Labs: Lab Results  Component Value Date   WBC 16.4 (H) 10/06/2016   HGB 12.5 10/06/2016   HCT 36.0 10/06/2016   MCV 89.3 10/06/2016   PLT 242 10/06/2016   CMP Latest Ref Rng & Units 09/21/2016  Glucose 65 - 99 mg/dL 96  BUN 6 - 20 mg/dL 10  Creatinine 0.44 - 1.00 mg/dL 0.52  Sodium 135 - 145 mmol/L 136  Potassium 3.5 - 5.1 mmol/L 3.6  Chloride 101 - 111 mmol/L 108  CO2 22 - 32 mmol/L 21(L)  Calcium 8.9 - 10.3 mg/dL 8.4(L)  Total Protein 6.5 - 8.1 g/dL 6.6  Total Bilirubin 0.3 - 1.2 mg/dL 0.2(L)  Alkaline Phos 38 - 126 U/L 104  AST 15 - 41 U/L 24   ALT 14 - 54 U/L 28    Discharge instruction: per After Visit Summary and "Baby and Me Booklet".  After Visit Meds:  Allergies as of 10/08/2016      Reactions   Cymbalta [duloxetine Hcl] Other (See Comments)   Worsening depression   Vicodin [hydrocodone-acetaminophen] Other (See Comments)   Bad migraine   Zofran [ondansetron Hcl] Itching      Medication List    STOP taking these medications   glyBURIDE 2.5 MG tablet Commonly known as:  DIABETA   metFORMIN 500 MG tablet Commonly known as:  GLUCOPHAGE     TAKE these medications   acetaminophen 500 MG tablet Commonly known as:  TYLENOL Take 1,000 mg by mouth every 6 (six) hours as needed for mild pain or headache.   aspirin EC 81 MG tablet Take 1 tablet (81 mg total) by mouth daily.   hydrocortisone-pramoxine rectal foam Commonly known as:  PROCTOFOAM HC Place 1 applicator rectally 2 (two) times daily.   ibuprofen 600 MG tablet Commonly known as:  ADVIL,MOTRIN Take 1 tablet (600 mg total) by mouth every 6 (six) hours.   omeprazole 20 MG capsule Commonly known as:  PRILOSEC Take 1 capsule (20 mg total) by mouth daily. 1 tablet a day What changed:  additional instructions   PRENATAL VITAMIN PO Take 1 tablet by mouth daily.       Diet: carb modified diet  Activity: Advance as tolerated. Pelvic rest for 6 weeks.   Outpatient follow up:6 weeks Follow up Appt:Future Appointments Date Time Provider [place]  11/05/2016 9:00 AM Roma Schanz, CNM [facility]   Follow up visit: No Follow-up on file.  Postpartum contraception: Natural Family Planning  Newborn Data: Live born female  Birth Weight: 7 lb 3.2 oz (3265 g) APGAR: 8, 9  Baby Feeding: Breast Disposition:home with mother   10/08/2016 Marissa Filbert, MD

## 2016-10-08 NOTE — Clinical Social Work Maternal (Signed)
  CLINICAL SOCIAL WORK MATERNAL/CHILD NOTE  Patient Details  Name: Marissa Lane MRN: [id number] Date of Birth: November 02, 1990  Date:  10/08/2016  Clinical Social Worker Initiating Note:  Laurey Arrow Date/ Time Initiated:  10/08/16/1123     Child's Name:  Marissa Lane   Legal Guardian:  Mother (FOB is Kandace Blitz)   Need for Interpreter:  None   Date of Referral:  10/08/16     Reason for Referral:  Behavioral Health Issues, including SI  (hxof PPD and depression. )   Referral Source:      Address:  [place] [address]  Phone number:  [phone]   Household Members:  Self, Significant Other, Parents, Minor Children   Natural Supports (not living in the home):  Immediate Family (FOB's family is also a source of support)   Professional Supports: None   Employment: Full-time   Type of Work: LPN   Education:  Chiropractor Resources:  Multimedia programmer   Other Resources:      Cultural/Religious Considerations Which May Impact Care:  Per [employer], MOB is Engineer, manufacturing.   Strengths:  Ability to meet basic needs , Pediatrician chosen , Home prepared for child , Understanding of illness   Risk Factors/Current Problems:  Mental Health Concerns    Cognitive State:  Alert , Able to Concentrate , Insightful , Linear Thinking    Mood/Affect:  Bright , Happy , Comfortable , Interested    CSW Assessment: CSW met with MOB to complete an assessment for a consult for hx of PPD and depression. When CSW arrived, MOB and FOB was awaiting d/c.  MOB gave CSW permission to meet with MOB while FOB was present; infant was being held by [facility]. CSW inquired about MOB's MH hx.  MOB acknowledged PPD signs and symptoms with MOB's older child and reported that MOB's depression was over 10 years ago after the passing of MOB's mother.  MOB reported uncontrollable crying and social isolation. MOB also reported that MOB's PPD was also  attributed to MOB's unhealthy relationship at the time. CSW educated MOB about PPD. CSW informed the MOB of possible supports and interventions to decrease PPD.  CSW also encouraged MOB to seek medical attention if needed for increased signs and symptoms for PPD. CSW offered MOB resources for outpatient counseling, and MOB declined.  MOB communicated that MOB has a wealth of support from MOB's immediate and extended family. CSW provided MOB a PPD checklist and encouraged MOB to utilize it weekly. CSW thanked MOB for meeting with CSW and provided MOB with CSW contact information. MOB did not have any additional questions or concerns at this time.  CSW Plan/Description:  Information/Referral to [employer] , [employer] , No Further Intervention Required/No Barriers to Discharge   Laurey Arrow, MSW, LCSW Clinical Social Work [phone]   Dimple Nanas, LCSW 10/08/2016, 12:33 PM

## 2016-10-08 NOTE — Discharge Instructions (Signed)

## 2016-10-08 NOTE — Lactation Note (Signed)
This note was copied from a baby's chart. Lactation Consultation Note Mom is breast and formula feeding. Will return to work end of May, plans to pump and bottle feed as well as formula feed as needed. Discussed supply and demand. Mom stated baby is feeding a lot and feels the baby is sucking for comfort. Educated on cluster feeding. Encouraged mm to BF before formula if baby isn't satisfied.  LC had discussed w/mom supplements to take to increase milk supply d/t PCOS. Mom has DEBP at home, encouraged to post pump after BF for 2 weeks to encouraged milk supply. Moms nipples are sore, some bruising noted. Has coconut oil, states helpful. Mom demonstrated hand expression. Unable to express colostrum. LC demonstrated hand expression w/colostrum noted. Mom has "V" shaped breast w/nipples facing down at the bottom end of breast.  Discussed engorgement, prevention, infections, monitoring milk supply and I&O of baby.  Reminded mom of OP [place] services if needed.  Patient Name: Marissa Lane Date: 10/08/2016 Reason for consult: Follow-up assessment   Maternal Data    Feeding Feeding Type: Breast Fed Nipple Type: Slow - flow Length of feed: 20 min  LATCH Score/Interventions       Type of Nipple: Everted at rest and after stimulation  Comfort (Breast/Nipple): Filling, red/small blisters or bruises, mild/mod discomfort  Problem noted: Mild/Moderate discomfort Interventions (Mild/moderate discomfort): Hand massage;Hand expression  Intervention(s): Breastfeeding basics reviewed;Support Pillows;Position options;Skin to skin     Lactation Tools Discussed/Used Tools: Other (comment) (coconut oil)   Consult Status Consult Status: Follow-up Date: 10/08/16 Follow-up type: In-patient    Theodoro Kalata 10/08/2016, 4:32 AM

## 2016-10-31 ENCOUNTER — Telehealth (HOSPITAL_COMMUNITY): Payer: Self-pay | Admitting: Lactation Services

## 2016-10-31 NOTE — Telephone Encounter (Signed)
Lactation Telephone:  This patient had breast feeding questions-  Per patient - my name is in the system as Marissa Lane  ( maiden name ) And mom called in as  Marissa Lane. DOB - August 02, 1990  Per mom has a hx of PCOS , milk came in 3 days after delivery and  Had some engorgement just for a day and then fed frequently and pumped.  After feeding at that time I could pump off 5 oz.  Had to supplement due to the baby not being satisfied. And then noticed my breast not being as full. Baby now can take 5-6 oz of formula from a bottle , and in the last 24 -48 hours has only breast a few times and only pumped a few times. Usually feeding the baby on one breast and pumping the other. Also taking 3 capsules of Fenugreek 3 x's a day and lactation cookies.  LC recommended options of how to build up her milk supply.  Option #1 - feed the baby at the breast 20 mins ( max ) and supplement afterwards.  ( meet the babies nutritional needs) ( LC mentioned to mom baby should be taking at least 3 oz a this age )or greater.  Option #2 - If the baby doesn't go to the breast , feed the baby from a bottle , and pump both breast for 15 - 20 mins.  LC stressed if mom has a desire to build back up her milk supply, the breast need stimulation  At least 8 x's a day or greater . LC also mentioned to mom due to her hx of PCOS , milk supply volume potentially a challenge. Any the need for supplementing is indicated consistently.  LC recommended to mom to call back with questions or concerns.

## 2016-11-05 ENCOUNTER — Encounter: Payer: Self-pay | Admitting: Women's Health

## 2016-11-05 ENCOUNTER — Ambulatory Visit (INDEPENDENT_AMBULATORY_CARE_PROVIDER_SITE_OTHER): Payer: Medicaid Other | Admitting: Women's Health

## 2016-11-05 DIAGNOSIS — Z8632 Personal history of gestational diabetes: Secondary | ICD-10-CM

## 2016-11-05 NOTE — Patient Instructions (Signed)
You will have your sugar test next visit.  Please do not eat or drink anything after midnight the night before you come, not even water.  You will be here for at least two hours.

## 2016-11-05 NOTE — Progress Notes (Signed)
Subjective:    Marissa Lane is a 26 y.o. G71P2002 Caucasian female who presents for a postpartum visit. She is 4 weeks postpartum following a spontaneous vaginal delivery at 30 gestational weeks after IOL for A2DM. Anesthesia: epidural. I have fully reviewed the prenatal and intrapartum course. Postpartum course has been uncomplicated. Baby's course has been uncomplicated. Baby is feeding by breast x 3.5wks, now bottle. Bleeding no bleeding. Bowel function is normal. Bladder function is normal. Patient is sexually active. Last sexual activity: last night. Contraception method is withdrawal- also plans condoms, rhythym method. Postpartum depression screening: negative. Score 1.  Last pap 2015 and was neg.  The following portions of the patient's history were reviewed and updated as appropriate: allergies, current medications, past medical history, past surgical history and problem list.  Review of Systems Pertinent items are noted in HPI.   Vitals:   11/05/16 0916  BP: 130/80  Pulse: 88  Weight: 215 lb 8 oz (97.8 kg)  Height: 5\' 4"  (1.626 m)   Patient's last menstrual period was 01/07/2016 (exact date).  Objective:   General:  alert, cooperative and no distress   Breasts:  deferred, no complaints  Lungs: clear to auscultation bilaterally  Heart:  regular rate and rhythm  Abdomen: soft, nontender   Vulva: normal  Vagina: normal vagina  Cervix:  closed  Corpus: Well-involuted  Adnexa:  Non-palpable  Rectal Exam: No hemorrhoids        Assessment:   Postpartum exam 4 wks s/p SVB after IOL for A2DM Bottlefeeding Depression screening Contraception counseling   Plan:  Contraception: plans condoms, w/drawal, rhythym method Follow up in: 4 weeks for postpartum 2hr GTT or earlier if needed  Tawnya Crook CNM, [facility] 11/05/2016 9:21 AM

## 2016-11-28 ENCOUNTER — Encounter: Payer: Self-pay | Admitting: Family Medicine

## 2016-12-03 ENCOUNTER — Other Ambulatory Visit: Payer: Medicaid Other | Admitting: Women's Health

## 2016-12-03 ENCOUNTER — Other Ambulatory Visit: Payer: Medicaid Other

## 2016-12-13 ENCOUNTER — Other Ambulatory Visit: Payer: Medicaid Other | Admitting: Women's Health

## 2016-12-13 ENCOUNTER — Other Ambulatory Visit: Payer: Medicaid Other

## 2016-12-23 ENCOUNTER — Telehealth: Payer: Self-pay | Admitting: *Deleted

## 2016-12-23 NOTE — Telephone Encounter (Signed)
Family doctor has been checking Testosterone yearly following PCOS and just wanted to get all labs done at the same time if possible. She also wanted to just do the A1C and not the fasting. Please advise.

## 2016-12-26 ENCOUNTER — Encounter: Payer: Self-pay | Admitting: Nurse Practitioner

## 2016-12-26 ENCOUNTER — Ambulatory Visit (INDEPENDENT_AMBULATORY_CARE_PROVIDER_SITE_OTHER): Payer: Medicaid Other | Admitting: Nurse Practitioner

## 2016-12-26 VITALS — BP 118/74 | Temp 99.2°F | Ht 64.0 in | Wt 207.0 lb

## 2016-12-26 DIAGNOSIS — J329 Chronic sinusitis, unspecified: Secondary | ICD-10-CM | POA: Diagnosis not present

## 2016-12-26 DIAGNOSIS — F17200 Nicotine dependence, unspecified, uncomplicated: Secondary | ICD-10-CM | POA: Diagnosis not present

## 2016-12-26 MED ORDER — VARENICLINE TARTRATE 0.5 MG X 11 & 1 MG X 42 PO MISC: ORAL | 0 refills | Status: DC

## 2016-12-26 MED ORDER — AZITHROMYCIN 250 MG PO TABS: ORAL_TABLET | ORAL | 0 refills | Status: DC

## 2016-12-26 NOTE — Progress Notes (Signed)
Subjective:  Presents for complaints of coughing and congestion for the past 2 weeks. Worse 5 days ago. Low-grade fever. Frontal sinus area pressure. Right ear pain. Frequent cough now producing dark green yellow sputum. No wheezing. Continues to smoke half pack per day. Has taken Chantix in the past without difficulty. Would like to restart this.  Objective:   BP 118/74   Temp 99.2 F (37.3 C) (Oral)   Ht 5\' 4"  (1.626 m)   Wt 207 lb (93.9 kg)   BMI 35.53 kg/m  NAD. Alert, oriented. TMs retracted, minimal erythema. Pharynx injected with green PND noted. Neck supple with mild soft anterior adenopathy. Lungs clear. Heart regular rate rhythm.  Assessment:   Problem List Items Addressed This Visit      Other   Smoker    Other Visit Diagnoses    Rhinosinusitis    -  Primary   Relevant Medications   azithromycin (ZITHROMAX Z-PAK) 250 MG tablet       Plan:   Meds ordered this encounter  Medications  . azithromycin (ZITHROMAX Z-PAK) 250 MG tablet    Sig: Take 2 tablets (500 mg) on  Day 1,  followed by 1 tablet (250 mg) once daily on Days 2 through 5.    Dispense:  6 each    Refill:  0    Order Specific Question:   Supervising Provider    Answer:   Mikey Kirschner [[id number]]  . varenicline (CHANTIX STARTING MONTH PAK) 0.5 MG X 11 & 1 MG X 42 tablet    Sig: Take one 0.5 mg tablet by mouth once daily for 3 days, then increase to one 0.5 mg tablet twice daily for 4 days, then increase to one 1 mg tablet twice daily.    Dispense:  53 tablet    Refill:  0    [facility]    Order Specific Question:   Supervising Provider    Answer:   Mikey Kirschner [[id number]]   OTC meds as directed for congestion and cough. Call back next week if no improvement, sooner if worse. Call back in one month if she wishes to continue Chantix.

## 2016-12-27 ENCOUNTER — Telehealth: Payer: Self-pay | Admitting: Nurse Practitioner

## 2016-12-27 NOTE — Telephone Encounter (Signed)
Patient is requesting Rx for Chantix to [facility].  She said pharmacy did not receive yesterday.

## 2016-12-27 NOTE — Telephone Encounter (Signed)
Called pharm and they have rx ready for pickup. Pt notified.

## 2017-01-03 ENCOUNTER — Other Ambulatory Visit (HOSPITAL_COMMUNITY)
Admission: RE | Admit: 2017-01-03 | Discharge: 2017-01-03 | Disposition: A | Discharge status: Home / Self Care | Payer: Medicaid Other | Source: Ambulatory Visit | Attending: Obstetrics & Gynecology | Admitting: Obstetrics & Gynecology

## 2017-01-03 ENCOUNTER — Encounter: Payer: Self-pay | Admitting: Women's Health

## 2017-01-03 ENCOUNTER — Ambulatory Visit (INDEPENDENT_AMBULATORY_CARE_PROVIDER_SITE_OTHER): Payer: Medicaid Other | Admitting: Women's Health

## 2017-01-03 ENCOUNTER — Other Ambulatory Visit: Payer: Medicaid Other

## 2017-01-03 VITALS — BP 100/60 | HR 82 | Ht 64.0 in | Wt 209.0 lb

## 2017-01-03 DIAGNOSIS — E282 Polycystic ovarian syndrome: Secondary | ICD-10-CM | POA: Diagnosis present

## 2017-01-03 DIAGNOSIS — Z01419 Encounter for gynecological examination (general) (routine) without abnormal findings: Secondary | ICD-10-CM

## 2017-01-03 DIAGNOSIS — Z0000 Encounter for general adult medical examination without abnormal findings: Secondary | ICD-10-CM | POA: Diagnosis not present

## 2017-01-03 DIAGNOSIS — Z131 Encounter for screening for diabetes mellitus: Secondary | ICD-10-CM | POA: Diagnosis present

## 2017-01-03 NOTE — Progress Notes (Signed)
Subjective:   Marissa Lane is a 26 y.o. G28P2002 Caucasian female here for a routine well-woman exam. Her baby is 68mths old, she is bottlefeeding, currently using condoms.   Patient's last menstrual period was 11/20/2016.    Current complaints: none.  PCP: Hoyle Sauer @ [facility]       Had A2DM during pregnancy, doing postpartum 2hr GTT today. Also requests testosterone and TSH- states usually checked w/ pcp yearly d/t pcos  Social History: Sexual: heterosexual Tobacco/alcohol: smokes 1ppd, has rx for Chantix needs to pick up/start Illicit drugs: no history of illicit drug use  The following portions of the patient's history were reviewed and updated as appropriate: allergies, current medications, past family history, past medical history, past social history, past surgical history and problem list.  Past Medical History Past Medical History:  Diagnosis Date  . Acne   . Chest pain   . Chronic hypertension   . Diabetes mellitus without complication ([place])   . 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 GERD (gastroesophageal reflux disease)   . Gestational diabetes    metformin  . H/O urinary frequency 06/25/2013  . Irregular intermenstrual bleeding 10/12/2013  . Obesity   . Palpitations   . PCOS (polycystic ovarian syndrome)   . Postpartum depression   . Tobacco abuse     Past Surgical History Past Surgical History:  Procedure Laterality Date  . CHOLECYSTECTOMY  03/06/2012   Procedure: LAPAROSCOPIC CHOLECYSTECTOMY;  Surgeon: Donato Heinz, MD;  Location: [facility] ORS;  Service: General;  Laterality: N/A;  . ESOPHAGOGASTRODUODENOSCOPY N/A 09/09/2012   Procedure: ESOPHAGOGASTRODUODENOSCOPY (EGD);  Surgeon: Rogene Houston, MD;  Location: [facility] ENDO SUITE;  Service: Endoscopy;  Laterality: N/A;  340  . MOUTH SURGERY    . SKIN GRAFT  2009   gum graft  . TONSILLECTOMY      Gynecologic History [id number]  Patient's last menstrual period was 11/20/2016. Contraception: condoms Last Pap: 2015.  Results were: normal Last mammogram: never. Results were: n/a Last TCS: never  Obstetric History OB History  Gravida Para Term Preterm AB Living  2 2 2     2   SAB TAB Ectopic Multiple Live Births        0 2    # Outcome Date GA Lbr Len/2nd Weight Sex Delivery Anes PTL Lv  2 Term 10/06/16 [redacted]w[redacted]d 09:12 / 00:15 7 lb 3.2 oz (3.265 kg) F Vag-Spont EPI  LIV  1 Term 09/20/09 [redacted]w[redacted]d  6 lb 8 oz (2.948 kg) F Vag-Spont EPI N LIV      Current Medications Current Outpatient Prescriptions on File Prior to Visit  Medication Sig Dispense Refill  . varenicline (CHANTIX STARTING MONTH PAK) 0.5 MG X 11 & 1 MG X 42 tablet Take one 0.5 mg tablet by mouth once daily for 3 days, then increase to one 0.5 mg tablet twice daily for 4 days, then increase to one 1 mg tablet twice daily. (Patient not taking: Reported on 01/03/2017) 53 tablet 0   No current facility-administered medications on file prior to visit.     Review of Systems Patient denies any headaches, blurred vision, shortness of breath, chest pain, abdominal pain, problems with bowel movements, urination, or intercourse.  Objective:  BP 100/60   Pulse 82   Ht 5\' 4"  (1.626 m)   Wt 209 lb (94.8 kg)   LMP 11/20/2016   BMI 35.87 kg/m  Physical Exam  General:  Well developed, well nourished, no acute distress. She is  alert and oriented x3. Skin:  Warm and dry Neck:  Midline trachea, no thyromegaly or nodules Cardiovascular: Regular rate and rhythm, no murmur heard Lungs:  Effort normal, all lung fields clear to auscultation bilaterally Breasts:  No dominant palpable mass, retraction, or nipple discharge Abdomen:  Soft, non tender, no hepatosplenomegaly or masses Pelvic:  External genitalia is normal in appearance.  The vagina is normal in appearance. The cervix is bulbous, no CMT.  Thin prep pap is done w/ HR HPV cotesting. Uterus is felt to be normal size, shape, and contour.  No adnexal masses or tenderness noted. Extremities:  No swelling or  varicosities noted Psych:  She has a normal mood and affect  Assessment:   Healthy well-woman exam A2DM recent pregnancy PCOS Smoker  Plan:  2hr GTT, TSH, testosterone today per request F/U 25yr for physical, or sooner if needed Advised smoking cessation Mammogram @26yo  or sooner if problems Colonoscopy- may need to start at 26yo- Gma passed away w/ colon CA in her [address]  Tawnya Crook CNM, [facility] 01/03/2017 9:53 AM

## 2017-01-04 LAB — GLUCOSE TOLERANCE, 2 HOURS W/ 1HR
GLUCOSE, 1 HOUR: 215 mg/dL — AB (ref 65–179)
GLUCOSE, 2 HOUR: 114 mg/dL (ref 65–152)
GLUCOSE, FASTING: 98 mg/dL — AB (ref 65–91)

## 2017-01-04 LAB — TESTOSTERONE: Testosterone: 34 ng/dL (ref 8–48)

## 2017-01-04 LAB — TSH: TSH: 0.532 u[IU]/mL (ref 0.450–4.500)

## 2017-01-06 ENCOUNTER — Other Ambulatory Visit: Payer: Self-pay | Admitting: Women's Health

## 2017-01-06 DIAGNOSIS — R7309 Other abnormal glucose: Secondary | ICD-10-CM

## 2017-01-08 LAB — CYTOLOGY - PAP
Chlamydia: NEGATIVE
DIAGNOSIS: NEGATIVE
HPV (WINDOPATH): DETECTED — AB
Neisseria Gonorrhea: NEGATIVE

## 2017-01-09 ENCOUNTER — Encounter: Payer: Self-pay | Admitting: Women's Health

## 2017-01-09 ENCOUNTER — Telehealth: Payer: Self-pay | Admitting: Women's Health

## 2017-01-09 DIAGNOSIS — R87810 Cervical high risk human papillomavirus (HPV) DNA test positive: Secondary | ICD-10-CM | POA: Insufficient documentation

## 2017-01-09 NOTE — Telephone Encounter (Signed)
LM for pt to return call. Need to notify of pap and sugar test.  Roma Schanz, CNM, [facility] 01/09/2017 5:01 PM

## 2017-01-09 NOTE — Telephone Encounter (Signed)
LMOVM to return call.

## 2017-01-13 ENCOUNTER — Telehealth: Payer: Self-pay | Admitting: Women's Health

## 2017-01-13 MED ORDER — NORETHIN-ETH ESTRAD-FE BIPHAS 1 MG-10 MCG / 10 MCG PO TABS: 1.0000 | ORAL_TABLET | Freq: Every day | ORAL | 3 refills | Status: DC

## 2017-01-13 NOTE — Telephone Encounter (Signed)
Called pt, notified her of neg pap w/ +HRHPV- repeat pap 1 yr. Also notified her of abnormal 2hr pp GTT- needs to come back in this week for A1C. States she has sporadically been checking sugars and are elevated. Will call her w/ A1C results.  Has decided she does want contraception from [place], more to regulate periods. Wants pills. Is bottlefeeding. No h/o HTN, DVT/PE, CVA, MI, or migraines w/ aura. Does smoke, is planning to quit, has rx for chantix. Rx LoLoestrin 3pk w/ 3RF, office already closed for today- to come in when she comes to lab this week and schedule appt for 58mths from now for coc f/u.  Roma Schanz, CNM, [facility] 01/13/2017 5:12 PM

## 2017-01-24 LAB — HEMOGLOBIN A1C
Est. average glucose Bld gHb Est-mCnc: 103 mg/dL
Hgb A1c MFr Bld: 5.2 % (ref 4.8–5.6)

## 2017-02-26 ENCOUNTER — Telehealth: Payer: Self-pay | Admitting: Family Medicine

## 2017-02-26 NOTE — Telephone Encounter (Signed)
Ready for pick up. Pt notified 

## 2017-02-26 NOTE — Telephone Encounter (Signed)
Patient needs a copy of her immunization record and her titer level. Would like to pick up today at lunchtime if possible. [phone]

## 2017-03-21 ENCOUNTER — Ambulatory Visit (INDEPENDENT_AMBULATORY_CARE_PROVIDER_SITE_OTHER): Payer: Medicaid Other | Admitting: Nurse Practitioner

## 2017-03-21 ENCOUNTER — Encounter: Payer: Self-pay | Admitting: Nurse Practitioner

## 2017-03-21 VITALS — BP 132/88 | Ht 64.0 in | Wt 209.0 lb

## 2017-03-21 DIAGNOSIS — F419 Anxiety disorder, unspecified: Secondary | ICD-10-CM

## 2017-03-21 MED ORDER — CITALOPRAM HYDROBROMIDE 20 MG PO TABS: ORAL_TABLET | ORAL | 2 refills | Status: DC

## 2017-03-22 ENCOUNTER — Encounter: Payer: Self-pay | Admitting: Nurse Practitioner

## 2017-03-22 NOTE — Progress Notes (Signed)
Subjective:  Presents for c/o chronic trouble focusing and completing tasks. Was given adult ADD screen at one time but did not complete. Had some postpartum anxiety with her last birth which actually began during the pregnancy. Describes increased anxiety and irrational fear at times. Some obsessive thoughts, no compulsions. Fidgety. Denies suicidal or homicidal thoughts or ideation. Taking birth control pills, denies any missed pills. GAD 7 : Generalized Anxiety Score 03/22/2017  Nervous, Anxious, on Edge 2  Control/stop worrying 2  Worry too much - different things 2  Trouble relaxing 2  Restless 3  Easily annoyed or irritable 1  Afraid - awful might happen 1  Total GAD 7 Score 13  Anxiety Difficulty Somewhat difficult    Depression screen PHQ 2/9 03/22/2017  Decreased Interest 0  Down, Depressed, Hopeless 0  PHQ - 2 Score 0     Objective:   BP 132/88   Ht 5\' 4"  (1.626 m)   Wt 209 lb (94.8 kg)   BMI 35.87 kg/m  NAD. Alert, oriented. Lungs clear. Heart regular rate rhythm. Thoughts logical coherent and relevant. Dressed appropriately. Cheerful, mildly anxious affect.  Assessment:   Problem List Items Addressed This Visit      Other   Anxiety - Primary   Relevant Medications   citalopram (CELEXA) 20 MG tablet       Plan:   Meds ordered this encounter  Medications  . citalopram (CELEXA) 20 MG tablet    Sig: Take 1/2 tab po qhs x 6 d then if needed increase to one tab    Dispense:  30 tablet    Refill:  2    Order Specific Question:   Supervising Provider    Answer:   Maggie Font   First priority is to get her anxiety under better control. If her trouble focusing and completing task persist after this, reevaluate for possible ADD.  Reviewed potential adverse effects of Celexa, DC med and call if any problems. Return in about 1 month (around 04/20/2017) for recheck.

## 2017-04-18 ENCOUNTER — Telehealth: Payer: Self-pay | Admitting: Nurse Practitioner

## 2017-04-18 ENCOUNTER — Ambulatory Visit (INDEPENDENT_AMBULATORY_CARE_PROVIDER_SITE_OTHER): Payer: Medicaid Other | Admitting: Nurse Practitioner

## 2017-04-18 ENCOUNTER — Encounter: Payer: Self-pay | Admitting: Nurse Practitioner

## 2017-04-18 ENCOUNTER — Encounter: Payer: Self-pay | Admitting: Family Medicine

## 2017-04-18 VITALS — BP 130/88 | Ht 64.0 in | Wt 209.0 lb

## 2017-04-18 DIAGNOSIS — F902 Attention-deficit hyperactivity disorder, combined type: Secondary | ICD-10-CM

## 2017-04-18 DIAGNOSIS — Z23 Encounter for immunization: Secondary | ICD-10-CM

## 2017-04-18 DIAGNOSIS — F419 Anxiety disorder, unspecified: Secondary | ICD-10-CM | POA: Diagnosis not present

## 2017-04-18 DIAGNOSIS — L918 Other hypertrophic disorders of the skin: Secondary | ICD-10-CM

## 2017-04-18 MED ORDER — GUANFACINE HCL ER 2 MG PO TB24: 2.0000 mg | ORAL_TABLET | Freq: Every day | ORAL | 2 refills | Status: DC

## 2017-04-18 MED ORDER — CYCLOBENZAPRINE HCL 10 MG PO TABS: 10.0000 mg | ORAL_TABLET | Freq: Three times a day (TID) | ORAL | 0 refills | Status: DC | PRN

## 2017-04-18 NOTE — Telephone Encounter (Signed)
She didn't get her prescription of Flexeril

## 2017-04-18 NOTE — Telephone Encounter (Signed)
Left message to return call 

## 2017-04-18 NOTE — Telephone Encounter (Signed)
Patient seen Marissa Lane today.  She wants to know when she should follow up?

## 2017-04-18 NOTE — Telephone Encounter (Signed)
Flexeril sent in. Follow up depends on response to Intuniv. At the very least see her back in 3 months. Hopefully she is signed up for my chart to give me updates in the meantime regarding her ADHD med. Thanks.

## 2017-04-19 ENCOUNTER — Encounter: Payer: Self-pay | Admitting: Nurse Practitioner

## 2017-04-19 DIAGNOSIS — F902 Attention-deficit hyperactivity disorder, combined type: Secondary | ICD-10-CM | POA: Insufficient documentation

## 2017-04-19 NOTE — Progress Notes (Signed)
Subjective:  Presents for recheck of her anxiety. Doing well on Celexa. Symptoms have improved. Has changed jobs which has greatly reduced her stress level. Much happier. 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 Requesting Flexeril which she has taken the past for mild muscle strain in the low back area while playing with her daughter. Is off work the next 4 days and anticipate the symptoms to be resolved. States her heart rate at baseline runs in the 90s. Also has a sore skin tag on the left inner upper thigh that is been there for about 6 months. Has gotten slightly larger in size from friction. GAD 7 : Generalized Anxiety Score 04/18/2017 03/22/2017  Nervous, Anxious, on Edge 1 2  Control/stop worrying 1 2  Worry too much - different things 2 2  Trouble relaxing 1 2  Restless 1 3  Easily annoyed or irritable 1 1  Afraid - awful might happen 1 1  Total GAD 7 Score 8 13  Anxiety Difficulty Not difficult at all Somewhat difficult   See ADHD screening tool.    Objective:   BP 130/88   Ht 5\' 4"  (1.626 m)   Wt 209 lb (94.8 kg)   BMI 35.87 kg/m  NAD. Alert, oriented. Lungs clear. Heart regular rate rhythm. EKG normal limit. Pulse regular and in the 90s. Thoughts logical coherent and relevant. Cheerful, affect. Dressed appropriately. Making good eye contact. A slightly raised flat mildly erythematous inflamed skin tag noted on the left upper inner thigh.  Assessment:   Problem List Items Addressed This Visit      Other   Anxiety - Primary   Attention deficit hyperactivity disorder (ADHD), combined type    Other Visit Diagnoses    Inflamed skin tag       Relevant Orders   Ambulatory referral to Dermatology   Need for influenza vaccination       Relevant Orders   Flu Vaccine QUAD 36+ mos IM (Completed)        Plan:   Meds ordered this encounter  Medications  . guanFACINE (INTUNIV) 2 MG TB24 ER tablet    Sig: Take 1 tablet (2 mg total) by mouth  daily.    Dispense:  30 tablet    Refill:  2    Order Specific Question:   Supervising Provider    Answer:   Mikey Kirschner [[id number]]  . cyclobenzaprine (FLEXERIL) 10 MG tablet    Sig: Take 1 tablet (10 mg total) by mouth 3 (three) times daily as needed for muscle spasms.    Dispense:  30 tablet    Refill:  0    Order Specific Question:   Supervising Provider    Answer:   Mikey Kirschner [[id number]]   Continue Celexa as directed. Discussed options for ADHD. Concerned about possible elevation in her pulse with a stimulant medication. Start with Intuniv once daily with possible titration needed. Reviewed potential adverse effects. DC med and contact office if any problems. If we can get baseline pulse lower, consider stimulant medication. Note patient takes birth control pills consistently. No plans for pregnancy. Referred to dermatology for removal of skin tag. Return in about 3 months (around 07/18/2017) for recheck.

## 2017-04-22 NOTE — Telephone Encounter (Signed)
Left message to return call 

## 2017-04-24 ENCOUNTER — Encounter: Payer: Self-pay | Admitting: Family Medicine

## 2017-04-29 NOTE — Telephone Encounter (Signed)
Patient advised Flexeril sent in. Follow up depends on response to Intuniv. At the very least see her back in 3 months. Hopefully she is signed up for my chart to give me updates in the meantime regarding her ADHD med. Thanks. Patient verbalized understanding.

## 2017-05-01 ENCOUNTER — Ambulatory Visit: Payer: Medicaid Other

## 2017-07-28 ENCOUNTER — Ambulatory Visit: Payer: Medicaid Other | Admitting: Adult Health

## 2017-08-05 ENCOUNTER — Ambulatory Visit (INDEPENDENT_AMBULATORY_CARE_PROVIDER_SITE_OTHER): Payer: Commercial Managed Care - PPO | Admitting: Adult Health

## 2017-08-05 ENCOUNTER — Encounter: Payer: Self-pay | Admitting: Adult Health

## 2017-08-05 VITALS — BP 128/80 | HR 97 | Ht 64.0 in | Wt 212.0 lb

## 2017-08-05 DIAGNOSIS — N6314 Unspecified lump in the right breast, lower inner quadrant: Secondary | ICD-10-CM

## 2017-08-05 DIAGNOSIS — N920 Excessive and frequent menstruation with regular cycle: Secondary | ICD-10-CM | POA: Insufficient documentation

## 2017-08-05 DIAGNOSIS — N946 Dysmenorrhea, unspecified: Secondary | ICD-10-CM | POA: Diagnosis not present

## 2017-08-05 NOTE — Progress Notes (Signed)
Subjective:     Patient ID: [name], female   DOB: [birth year], 27 y.o.   MRN: [id number]  HPI Darsi is a 27 year old white female in complaining of knot in right breast and periods heavier(may change every 1-2 hours) and lasting 7-9 days and has sharp pains on and off.She is off her OCs.   Review of Systems Knot right breast Periods lasting longer and heavier and has sharp pains on and off  Reviewed past medical,surgical, social and family history. Reviewed medications and allergies.     Objective:   Physical Exam BP 128/80 (BP Location: Right Arm, Patient Position: Sitting, Cuff Size: Large)   Pulse 97   Ht 5\' 4"  (1.626 m)   Wt 212 lb (96.2 kg)   LMP 07/20/2017   BMI 36.39 kg/m   Skin warm and dry,  Breasts:no dominate palpable mass, retraction or nipple discharge on left, on right, no retraction or nipple discharge, but has tender mobile mass at 3 o'clock 2 FB from areola. Pelvic: external genitalia is normal in appearance no lesions, vagina:scant discharge without odor,urethra has no lesions or masses noted, cervix:smooth and bulbous, uterus: normal size, shape and contour, non tender, no masses felt, adnexa: no masses or tenderness noted. Bladder is non tender and no masses felt.  Will get GYN [place] to assess uterus and ovaries.    Assessment:     1. Mass of lower inner quadrant of right breast   2. Menorrhagia with regular cycle   3. Dysmenorrhea       Plan:     Return in 1 week for GYN [place] Get right breast US 1/22 at 3:45 pm at [facility] We will talk after [place] results back

## 2017-08-12 ENCOUNTER — Ambulatory Visit (HOSPITAL_COMMUNITY)
Admission: RE | Admit: 2017-08-12 | Discharge: 2017-08-12 | Disposition: A | Discharge status: Home / Self Care | Payer: Commercial Managed Care - PPO | Source: Ambulatory Visit | Attending: Adult Health | Admitting: Adult Health

## 2017-08-12 ENCOUNTER — Other Ambulatory Visit: Payer: Commercial Managed Care - PPO

## 2017-08-12 DIAGNOSIS — N6314 Unspecified lump in the right breast, lower inner quadrant: Secondary | ICD-10-CM | POA: Insufficient documentation

## 2017-08-15 ENCOUNTER — Encounter: Payer: Self-pay | Admitting: Nurse Practitioner

## 2017-08-15 ENCOUNTER — Ambulatory Visit (INDEPENDENT_AMBULATORY_CARE_PROVIDER_SITE_OTHER): Payer: Commercial Managed Care - PPO | Admitting: Nurse Practitioner

## 2017-08-15 ENCOUNTER — Telehealth: Payer: Self-pay | Admitting: *Deleted

## 2017-08-15 VITALS — BP 148/98 | Temp 98.6°F | Ht 64.0 in | Wt 211.0 lb

## 2017-08-15 DIAGNOSIS — G5692 Unspecified mononeuropathy of left upper limb: Secondary | ICD-10-CM | POA: Diagnosis not present

## 2017-08-15 DIAGNOSIS — M542 Cervicalgia: Secondary | ICD-10-CM | POA: Diagnosis not present

## 2017-08-15 MED ORDER — DICLOFENAC SODIUM 75 MG PO TBEC: 75.0000 mg | DELAYED_RELEASE_TABLET | Freq: Two times a day (BID) | ORAL | 0 refills | Status: DC

## 2017-08-15 MED ORDER — MELOXICAM 15 MG PO TABS: 15.0000 mg | ORAL_TABLET | Freq: Every day | ORAL | 0 refills | Status: DC

## 2017-08-15 NOTE — Telephone Encounter (Signed)
Changed to Meloxicam

## 2017-08-15 NOTE — Telephone Encounter (Signed)
Fax from pharm. Diclofenac 75mg  not covered by medicaid. Medicaid covers ibuprofen, indomethacin, meloxicam, naproxen, sulindae. Do you want to change.  [place]

## 2017-08-18 ENCOUNTER — Encounter: Payer: Self-pay | Admitting: Nurse Practitioner

## 2017-08-18 ENCOUNTER — Ambulatory Visit (INDEPENDENT_AMBULATORY_CARE_PROVIDER_SITE_OTHER): Payer: Commercial Managed Care - PPO

## 2017-08-18 DIAGNOSIS — N946 Dysmenorrhea, unspecified: Secondary | ICD-10-CM | POA: Diagnosis not present

## 2017-08-18 DIAGNOSIS — N920 Excessive and frequent menstruation with regular cycle: Secondary | ICD-10-CM

## 2017-08-18 DIAGNOSIS — N83291 Other ovarian cyst, right side: Secondary | ICD-10-CM

## 2017-08-18 NOTE — Progress Notes (Signed)
PELVIC US TA/TV: homogeneous anteverted uterus,wnl,EEC 15 mm,normal left ovary,echogenic mass right ovary w/through transmission 2.6 x 1.6 x 1.9 cm,ovaries appear mobile,no free fluid

## 2017-08-18 NOTE — Progress Notes (Signed)
Subjective: Presents for complaints of left neck pain that began 2 days ago.  Questions whether she slept on her arm in a wrong position.  Pain radiates down the shoulder with a warm feeling on the backside of her left arm radiating down the side of her arm to her small finger at times.  No specific history of injury.  Worse with turning her head in certain positions.  Significant pain in the upper left back area.  Has used a sling to see if this will help some of the pain.  Mild intermittent weakness but not constant.  Has tried Biofreeze and ice applications as well as massage therapy.  Has tried Aleve for her pain.  Minimal improvement.  Objective:   BP (!) 148/98   Temp 98.6 F (37 C) (Oral)   Ht 5\' 4"  (1.626 m)   Wt 211 lb 0.4 oz (95.7 kg)   LMP 07/20/2017   BMI 36.22 kg/m  NAD.  Alert, oriented.  Lungs clear.  Heart regular rate and rhythm.  Extremely tight tender muscles noted along the cervical and lateral neck area into the trapezius and along the rhomboids on the left side.  No shoulder joint line pain.  Can perform full rotation of the left shoulder without difficulty.  Mild tenderness with rotation of the neck.  Hand and arm strength 5+ bilateral.  Sensation grossly intact.  Strong radial pulses bilaterally.  Assessment:  Neck pain  Neuropathy, arm, left    Plan:   Meds ordered this encounter  Medications  . DISCONTD: diclofenac (VOLTAREN) 75 MG EC tablet    Sig: Take 1 tablet (75 mg total) by mouth 2 (two) times daily.    Dispense:  30 tablet    Refill:  0    Order Specific Question:   Supervising Provider    Answer:   Mikey Kirschner [[id number]]  . meloxicam (MOBIC) 15 MG tablet    Sig: Take 1 tablet (15 mg total) by mouth daily.    Dispense:  30 tablet    Refill:  0    Order Specific Question:   Supervising Provider    Answer:   Mikey Kirschner [[id number]]   Patient has cyclobenzaprine at home.  Use a half to whole tablet 3 times daily as needed, drowsiness precautions.   Start meloxicam as directed.  Stretching exercises.  Ice/heat applications.  TENS unit.  Call back in 7-10 days if no improvement, sooner if worse.  Warning signs were reviewed.

## 2017-08-20 ENCOUNTER — Telehealth: Payer: Self-pay | Admitting: Adult Health

## 2017-08-20 ENCOUNTER — Telehealth: Payer: Self-pay | Admitting: *Deleted

## 2017-08-20 NOTE — Telephone Encounter (Signed)
Wants To know if needs pap in June and that is yes

## 2017-08-20 NOTE — Telephone Encounter (Signed)
Left message that [place] normal, with simple ovarian cysts

## 2017-08-22 ENCOUNTER — Encounter: Payer: Self-pay | Admitting: Nurse Practitioner

## 2017-08-22 ENCOUNTER — Ambulatory Visit (INDEPENDENT_AMBULATORY_CARE_PROVIDER_SITE_OTHER): Payer: Commercial Managed Care - PPO | Admitting: Nurse Practitioner

## 2017-08-22 VITALS — BP 122/86 | Ht 64.0 in | Wt 214.6 lb

## 2017-08-22 DIAGNOSIS — M542 Cervicalgia: Secondary | ICD-10-CM

## 2017-08-22 DIAGNOSIS — G5692 Unspecified mononeuropathy of left upper limb: Secondary | ICD-10-CM

## 2017-08-22 MED ORDER — GABAPENTIN 300 MG PO CAPS: 300.0000 mg | ORAL_CAPSULE | Freq: Two times a day (BID) | ORAL | 2 refills | Status: DC

## 2017-08-22 MED ORDER — TRAMADOL HCL 50 MG PO TABS: 50.0000 mg | ORAL_TABLET | Freq: Three times a day (TID) | ORAL | 0 refills | Status: DC | PRN

## 2017-08-23 ENCOUNTER — Encounter: Payer: Self-pay | Admitting: Nurse Practitioner

## 2017-08-23 NOTE — Progress Notes (Signed)
Subjective: Presents for recheck on her left neck and arm pain.  See previous note.  Went to urgent care on 1/26, according to patient she had a cervical spine x-ray which was normal.  Given a dose of Toradol.  Started on a prednisone taper which she has completed.  No improvement of her pain or neuropathic symptoms.  Has tried Flexeril leave Biofreeze icy hot and compresses with no relief.  Pain is getting more intense.  Symptoms are much worse when she is lying down or sitting.  Has to eat her meals standing up due to extreme pain.  Neuropathic pain tends to fluctuate but having 2-3 significant episodes per day where she has extreme weakness and unable to lift items with her left arm.  Pain continues to begin in the left neck area radiating down the left arm, neuropathic pain and numbness is now more in the palm of the hand.  Objective:   BP 122/86   Ht 5\' 4"  (1.626 m)   Wt 214 lb 9.6 oz (97.3 kg)   BMI 36.84 kg/m  NAD.  Alert, oriented.  Lungs clear.  Heart regular rate and rhythm.  Upper back and neck area muscle tightness much improved from previous exam.  Minimal tenderness.  Slight tenderness in the upper left anterior chest wall.  No shoulder joint line tenderness.  Can perform active ROM of the neck with mild tenderness.  Hand and arm strength 5+ bilaterally strong radial pulses.  Sensation grossly intact.  Patient cradles left arm up against her chest.  Assessment:   Problem List Items Addressed This Visit    None    Visit Diagnoses    Neck pain    -  Primary   Relevant Orders   MR Cervical Spine Wo Contrast   Neuropathy, arm, left       Relevant Medications   gabapentin (NEURONTIN) 300 MG capsule   Other Relevant Orders   MR Cervical Spine Wo Contrast   Cervicalgia       Relevant Orders   MR Cervical Spine Wo Contrast       Plan:   Meds ordered this encounter  Medications  . gabapentin (NEURONTIN) 300 MG capsule    Sig: Take 1 capsule (300 mg total) by mouth 2 (two) times  daily.    Dispense:  60 capsule    Refill:  2    Order Specific Question:   Supervising Provider    Answer:   Mikey Kirschner [[id number]]  . traMADol (ULTRAM) 50 MG tablet    Sig: Take 1 tablet (50 mg total) by mouth every 8 (eight) hours as needed.    Dispense:  30 tablet    Refill:  0    Order Specific Question:   Supervising Provider    Answer:   Mikey Kirschner [[id number]]   Due to worsening of pain and neuropathic symptoms, MRI of the cervical spine schedule.  Trial of gabapentin and tramadol for pain control.  Warning signs reviewed.  Patient to go to ED in the meantime if worse.

## 2017-08-25 ENCOUNTER — Telehealth: Payer: Self-pay | Admitting: Family Medicine

## 2017-08-25 NOTE — Telephone Encounter (Signed)
Faxed order for pt's MRI C-spine without contrast to [facility])  No PA req'd for MRI Appt sch'd for pt's MRI, called & notified pt  filed

## 2017-08-27 ENCOUNTER — Other Ambulatory Visit: Payer: Self-pay | Admitting: Nurse Practitioner

## 2017-08-27 ENCOUNTER — Telehealth: Payer: Self-pay | Admitting: Nurse Practitioner

## 2017-08-27 DIAGNOSIS — G5692 Unspecified mononeuropathy of left upper limb: Secondary | ICD-10-CM

## 2017-08-27 DIAGNOSIS — M542 Cervicalgia: Secondary | ICD-10-CM

## 2017-08-27 NOTE — Telephone Encounter (Signed)
Results discussed with patient. 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 Will refer to neurosurgeon.  Patient verbalized understanding.

## 2017-08-27 NOTE — Telephone Encounter (Signed)
Review MRI cervical spine results in yellow folder from [facility].

## 2017-08-27 NOTE — Telephone Encounter (Signed)
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 Will refer to neurosurgeon.

## 2017-09-12 ENCOUNTER — Telehealth: Payer: Self-pay | Admitting: *Deleted

## 2017-09-12 NOTE — Telephone Encounter (Signed)
Pt states that she recently finished taking a tapered steroid regimen and starting earlier this week she began having s/s of a yeast infection. She c/o thick, white discharge with no odor, some burning and itching. She states that she tried taking the 3 day dosage of monistat and the 1 day dosage with no relief. She requests a prescription be sent in. Informed pt that I would send her request to a provider and let her know their response. Pt verbalized understanding.

## 2017-09-12 NOTE — Telephone Encounter (Signed)
Informed pt that [name] recommends she be seen to have the discharge evaluated before they can prescribe medication. Pt states that she will continue with OTCs and call back on Monday if things havent improved.

## 2018-01-08 ENCOUNTER — Encounter (INDEPENDENT_AMBULATORY_CARE_PROVIDER_SITE_OTHER): Payer: Self-pay

## 2018-01-08 ENCOUNTER — Encounter: Payer: Self-pay | Admitting: Advanced Practice Midwife

## 2018-01-08 ENCOUNTER — Other Ambulatory Visit (HOSPITAL_COMMUNITY)
Admission: RE | Admit: 2018-01-08 | Discharge: 2018-01-08 | Disposition: A | Discharge status: Home / Self Care | Payer: Commercial Managed Care - PPO | Source: Ambulatory Visit | Attending: Advanced Practice Midwife | Admitting: Advanced Practice Midwife

## 2018-01-08 ENCOUNTER — Ambulatory Visit (INDEPENDENT_AMBULATORY_CARE_PROVIDER_SITE_OTHER): Payer: Commercial Managed Care - PPO | Admitting: Advanced Practice Midwife

## 2018-01-08 ENCOUNTER — Other Ambulatory Visit: Payer: Self-pay

## 2018-01-08 VITALS — BP 128/91 | HR 102 | Ht 64.0 in | Wt 213.0 lb

## 2018-01-08 DIAGNOSIS — Z01419 Encounter for gynecological examination (general) (routine) without abnormal findings: Secondary | ICD-10-CM | POA: Diagnosis present

## 2018-01-08 DIAGNOSIS — R87610 Atypical squamous cells of undetermined significance on cytologic smear of cervix (ASC-US): Secondary | ICD-10-CM | POA: Diagnosis not present

## 2018-01-08 NOTE — Progress Notes (Signed)
Marissa Lane 27 y.o.  Vitals:   01/08/18 0910  BP: (!) 128/91  Pulse: (!) 102     Filed Weights   01/08/18 0910  Weight: 213 lb (96.6 kg)    Past Medical History: Past Medical History:  Diagnosis Date  . Acne   . Chest pain   . Chronic hypertension   . Diabetes mellitus without complication ([place])   . Dysthymic disorder    over dose age 40  . GERD (gastroesophageal reflux disease)   . Gestational diabetes    metformin  . H/O urinary frequency 06/25/2013  . Irregular intermenstrual bleeding 10/12/2013  . Obesity   . Palpitations   . PCOS (polycystic ovarian syndrome)   . Postpartum depression   . Tobacco abuse     Past Surgical History: Past Surgical History:  Procedure Laterality Date  . CHOLECYSTECTOMY  03/06/2012   Procedure: LAPAROSCOPIC CHOLECYSTECTOMY;  Surgeon: Donato Heinz, MD;  Location: [facility]S;  Service: General;  Laterality: N/A;  . ESOPHAGOGASTRODUODENOSCOPY N/A 09/09/2012   Procedure: ESOPHAGOGASTRODUODENOSCOPY (EGD);  Surgeon: Rogene Houston, MD;  Location: [facility]ITE;  Service: Endoscopy;  Laterality: N/A;  340  . MOUTH SURGERY    . SKIN GRAFT  2009   gum graft  . TONSILLECTOMY      Family History: Family History  Adopted: Yes  Problem Relation Age of Onset  . Congestive Heart Failure Mother   . Irritable bowel syndrome Mother   . Ulcers Mother   . Anemia Mother   . Hypertension Brother   . Colon cancer Maternal Grandmother 71  . Heart disease Maternal Grandfather 54       MI    Social History: Social History   Tobacco Use  . Smoking status: Current Every Day Smoker    Packs/day: 0.00    Years: 10.00    Pack years: 0.00    Types: Cigarettes  . Smokeless tobacco: Never Used  Substance Use Topics  . Alcohol use: No  . Drug use: No    Allergies:  Allergies  Allergen Reactions  . Cymbalta [Duloxetine Hcl] Other (See Comments)    Worsening depression  . Vicodin [Hydrocodone-Acetaminophen] Other (See Comments)    Bad  migraine  . Zofran [Ondansetron Hcl] Itching      Current Outpatient Medications:  .  citalopram (CELEXA) 20 MG tablet, Take 1/2 tab po qhs x 6 d then if needed increase to one tab (Patient not taking: Reported on 08/15/2017), Disp: 30 tablet, Rfl: 2 .  cyclobenzaprine (FLEXERIL) 10 MG tablet, Take 1 tablet (10 mg total) by mouth 3 (three) times daily as needed for muscle spasms. (Patient not taking: Reported on 01/08/2018), Disp: 30 tablet, Rfl: 0 .  gabapentin (NEURONTIN) 300 MG capsule, Take 1 capsule (300 mg total) by mouth 2 (two) times daily. (Patient not taking: Reported on 01/08/2018), Disp: 60 capsule, Rfl: 2  History of Present Illness: Here for pap and physical. Last pap 6/18, neg w/HR HPV.  Sees [facility] for primary care needs  Doesn't want BC, OK if gets pregnant but being careful   Review of Systems   Patient denies any headaches, blurred vision, shortness of breath, chest pain, abdominal pain, problems with bowel movements, urination, or intercourse.   Physical Exam: General:  Well developed, well nourished, no acute distress Skin:  Warm and dry Neck:  Midline trachea, normal thyroid Lungs; Clear to auscultation bilaterally Breast:  No dominant palpable mass, retraction, or nipple discharge Cardiovascular: Regular rate and rhythm  Abdomen:  Soft, non tender, no hepatosplenomegaly Pelvic:  External genitalia is normal in appearance.  The vagina is normal in appearance.  The cervix is bulbous.  Uterus is felt to be normal size, shape, and contour.  No adnexal masses or tenderness noted. Exam limited by habitus Extremities:  No swelling or varicosities   Impression: if pap neg w/neg HPV, may go to q 3 years otherwise colpo

## 2018-01-13 ENCOUNTER — Telehealth: Payer: Self-pay | Admitting: *Deleted

## 2018-01-13 LAB — CYTOLOGY - PAP
Chlamydia: NEGATIVE
Diagnosis: UNDETERMINED — AB
HPV (WINDOPATH): NOT DETECTED
NEISSERIA GONORRHEA: NEGATIVE

## 2018-01-13 NOTE — Telephone Encounter (Addendum)
Attempted to call patient, left message to call me back for results.  ----- Message from Roma Schanz, CNM sent at 01/13/2018 12:41 PM EDT ----- Please let her know pap slightly abnormal, but HRHPV negative, plan to repeat pap 77yr (instead of 3)

## 2018-01-14 NOTE — Telephone Encounter (Signed)
Called patient back and informed her of results.  

## 2018-03-09 ENCOUNTER — Ambulatory Visit: Payer: Commercial Managed Care - PPO | Admitting: Adult Health

## 2018-03-12 ENCOUNTER — Encounter (INDEPENDENT_AMBULATORY_CARE_PROVIDER_SITE_OTHER): Payer: Self-pay

## 2018-03-12 ENCOUNTER — Encounter: Payer: Self-pay | Admitting: Adult Health

## 2018-03-12 ENCOUNTER — Ambulatory Visit (INDEPENDENT_AMBULATORY_CARE_PROVIDER_SITE_OTHER): Payer: Commercial Managed Care - PPO | Admitting: Adult Health

## 2018-03-12 VITALS — BP 143/87 | HR 118 | Ht 64.0 in | Wt 212.0 lb

## 2018-03-12 DIAGNOSIS — Z3A01 Less than 8 weeks gestation of pregnancy: Secondary | ICD-10-CM | POA: Insufficient documentation

## 2018-03-12 DIAGNOSIS — N926 Irregular menstruation, unspecified: Secondary | ICD-10-CM | POA: Diagnosis not present

## 2018-03-12 DIAGNOSIS — R110 Nausea: Secondary | ICD-10-CM

## 2018-03-12 DIAGNOSIS — O09291 Supervision of pregnancy with other poor reproductive or obstetric history, first trimester: Secondary | ICD-10-CM

## 2018-03-12 DIAGNOSIS — Z8632 Personal history of gestational diabetes: Secondary | ICD-10-CM

## 2018-03-12 DIAGNOSIS — O3680X0 Pregnancy with inconclusive fetal viability, not applicable or unspecified: Secondary | ICD-10-CM | POA: Insufficient documentation

## 2018-03-12 DIAGNOSIS — Z3201 Encounter for pregnancy test, result positive: Secondary | ICD-10-CM

## 2018-03-12 LAB — POCT URINE PREGNANCY: Preg Test, Ur: POSITIVE — AB

## 2018-03-12 NOTE — Progress Notes (Addendum)
  Subjective:     Patient ID: Marissa Lane, female   DOB: 1990/10/13, 27 y.o.   MRN: [id number]  HPI Marissa Lane is a 27 year old white female, married in for UPT, has had several +HPTs, after missing a period and nausea started this week. Had gestational diabetes in past, and had to use progesterone in last pregnancy.   Review of Systems +missed period, several +HPTs +nausea  Not sleeping well,has taken 5 mg melatonin  Reviewed past medical,surgical, social and family history. Reviewed medications and allergies.     Objective:   Physical Exam BP (!) 143/87 (BP Location: Left Arm, Patient Position: Sitting, Cuff Size: Normal)   Pulse (!) 118   Ht 5\' 4"  (1.626 m)   Wt 212 lb (96.2 kg)   LMP 02/01/2018   BMI 36.39 kg/m +UPT, about 5+4 weeks, by LMP with EDD 11/08/18.Skin warm and dry. Neck: mid line trachea, normal thyroid, good ROM, no lymphadenopathy noted. Lungs: clear to ausculation bilaterally. Cardiovascular: regular rate and rhythm.Abdomen is soft and non tender.     Assessment:     1. Pregnancy test positive   2. Less than [redacted] weeks gestation of pregnancy   3. Encounter to determine fetal viability of pregnancy, single or unspecified fetus   4. History of gestational diabetes in prior pregnancy, currently pregnant in first trimester       Plan:    Continue PNV Don't take melatonin  Return in 4 weeks for New OB Check QHCG and progesterone today Dating [place] 9/5 at 9:30 am at [facility] Review handouts on First trimester and by [facility]  Eat often

## 2018-03-12 NOTE — Patient Instructions (Signed)
First Trimester of Pregnancy The first trimester of pregnancy is from week 1 until the end of week 13 (months 1 through 3). A week after a sperm fertilizes an egg, the egg will implant on the wall of the uterus. This embryo will begin to develop into a baby. Genes from you and your partner will form the baby. The female genes will determine whether the baby will be a boy or a girl. At 6-8 weeks, the eyes and face will be formed, and the heartbeat can be seen on ultrasound. At the end of 12 weeks, all the baby's organs will be formed. Now that you are pregnant, you will want to do everything you can to have a healthy baby. Two of the most important things are to get good prenatal care and to follow your health care provider's instructions. Prenatal care is all the medical care you receive before the baby's birth. This care will help prevent, find, and treat any problems during the pregnancy and childbirth. Body changes during your first trimester Your body goes through many changes during pregnancy. The changes vary from woman to woman.  You may gain or lose a couple of pounds at first.  You may feel sick to your stomach (nauseous) and you may throw up (vomit). If the vomiting is uncontrollable, call your health care provider.  You may tire easily.  You may develop headaches that can be relieved by medicines. All medicines should be approved by your health care provider.  You may urinate more often. Painful urination may mean you have a bladder infection.  You may develop heartburn as a result of your pregnancy.  You may develop constipation because certain hormones are causing the muscles that push stool through your intestines to slow down.  You may develop hemorrhoids or swollen veins (varicose veins).  Your breasts may begin to grow larger and become tender. Your nipples may stick out more, and the tissue that surrounds them (areola) may become darker.  Your gums may bleed and may be  sensitive to brushing and flossing.  Dark spots or blotches (chloasma, mask of pregnancy) may develop on your face. This will likely fade after the baby is born.  Your menstrual periods will stop.  You may have a loss of appetite.  You may develop cravings for certain kinds of food.  You may have changes in your emotions from day to day, such as being excited to be pregnant or being concerned that something may go wrong with the pregnancy and baby.  You may have more vivid and strange dreams.  You may have changes in your hair. These can include thickening of your hair, rapid growth, and changes in texture. Some women also have hair loss during or after pregnancy, or hair that feels dry or thin. Your hair will most likely return to normal after your baby is born.  What to expect at prenatal visits During a routine prenatal visit:  You will be weighed to make sure you and the baby are growing normally.  Your blood pressure will be taken.  Your abdomen will be measured to track your baby's growth.  The fetal heartbeat will be listened to between weeks 10 and 14 of your pregnancy.  Test results from any previous visits will be discussed.  Your health care provider may ask you:  How you are feeling.  If you are feeling the baby move.  If you have had any abnormal symptoms, such as leaking fluid, bleeding, severe headaches,   or abdominal cramping.  If you are using any tobacco products, including cigarettes, chewing tobacco, and electronic cigarettes.  If you have any questions.  Other tests that may be performed during your first trimester include:  Blood tests to find your blood type and to check for the presence of any previous infections. The tests will also be used to check for low iron levels (anemia) and protein on red blood cells (Rh antibodies). Depending on your risk factors, or if you previously had diabetes during pregnancy, you may have tests to check for high blood  sugar that affects pregnant women (gestational diabetes).  Urine tests to check for infections, diabetes, or protein in the urine.  An ultrasound to confirm the proper growth and development of the baby.  Fetal screens for spinal cord problems (spina bifida) and Down syndrome.  HIV (human immunodeficiency virus) testing. Routine prenatal testing includes screening for HIV, unless you choose not to have this test.  You may need other tests to make sure you and the baby are doing well.  Follow these instructions at home: Medicines  Follow your health care provider's instructions regarding medicine use. Specific medicines may be either safe or unsafe to take during pregnancy.  Take a prenatal vitamin that contains at least 600 micrograms (mcg) of folic acid.  If you develop constipation, try taking a stool softener if your health care provider approves. Eating and drinking  Eat a balanced diet that includes fresh fruits and vegetables, whole grains, good sources of protein such as meat, eggs, or tofu, and low-fat dairy. Your health care provider will help you determine the amount of weight gain that is right for you.  Avoid raw meat and uncooked cheese. These carry germs that can cause birth defects in the baby.  Eating four or five small meals rather than three large meals a day may help relieve nausea and vomiting. If you start to feel nauseous, eating a few soda crackers can be helpful. Drinking liquids between meals, instead of during meals, also seems to help ease nausea and vomiting.  Limit foods that are high in fat and processed sugars, such as fried and sweet foods.  To prevent constipation: ? Eat foods that are high in fiber, such as fresh fruits and vegetables, whole grains, and beans. ? Drink enough fluid to keep your urine clear or pale yellow. Activity  Exercise only as directed by your health care provider. Most women can continue their usual exercise routine during  pregnancy. Try to exercise for 30 minutes at least 5 days a week. Exercising will help you: ? Control your weight. ? Stay in shape. ? Be prepared for labor and delivery.  Experiencing pain or cramping in the lower abdomen or lower back is a good sign that you should stop exercising. Check with your health care provider before continuing with normal exercises.  Try to avoid standing for long periods of time. Move your legs often if you must stand in one place for a long time.  Avoid heavy lifting.  Wear low-heeled shoes and practice good posture.  You may continue to have sex unless your health care provider tells you not to. Relieving pain and discomfort  Wear a good support bra to relieve breast tenderness.  Take warm sitz baths to soothe any pain or discomfort caused by hemorrhoids. Use hemorrhoid cream if your health care provider approves.  Rest with your legs elevated if you have leg cramps or low back pain.  If you develop   varicose veins in your legs, wear support hose. Elevate your feet for 15 minutes, 3-4 times a day. Limit salt in your diet. Prenatal care  Schedule your prenatal visits by the twelfth week of pregnancy. They are usually scheduled monthly at first, then more often in the last 2 months before delivery.  Write down your questions. Take them to your prenatal visits.  Keep all your prenatal visits as told by your health care provider. This is important. Safety  Wear your seat belt at all times when driving.  Make a list of emergency phone numbers, including numbers for family, friends, the hospital, and police and fire departments. General instructions  Ask your health care provider for a referral to a local prenatal education class. Begin classes no later than the beginning of month 6 of your pregnancy.  Ask for help if you have counseling or nutritional needs during pregnancy. Your health care provider can offer advice or refer you to specialists for help  with various needs.  Do not use hot tubs, steam rooms, or saunas.  Do not douche or use tampons or scented sanitary pads.  Do not cross your legs for long periods of time.  Avoid cat litter boxes and soil used by cats. These carry germs that can cause birth defects in the baby and possibly loss of the fetus by miscarriage or stillbirth.  Avoid all smoking, herbs, alcohol, and medicines not prescribed by your health care provider. Chemicals in these products affect the formation and growth of the baby.  Do not use any products that contain nicotine or tobacco, such as cigarettes and e-cigarettes. If you need help quitting, ask your health care provider. You may receive counseling support and other resources to help you quit.  Schedule a dentist appointment. At home, brush your teeth with a soft toothbrush and be gentle when you floss. Contact a health care provider if:  You have dizziness.  You have mild pelvic cramps, pelvic pressure, or nagging pain in the abdominal area.  You have persistent nausea, vomiting, or diarrhea.  You have a bad smelling vaginal discharge.  You have pain when you urinate.  You notice increased swelling in your face, hands, legs, or ankles.  You are exposed to fifth disease or chickenpox.  You are exposed to German measles (rubella) and have never had it. Get help right away if:  You have a fever.  You are leaking fluid from your vagina.  You have spotting or bleeding from your vagina.  You have severe abdominal cramping or pain.  You have rapid weight gain or loss.  You vomit blood or material that looks like coffee grounds.  You develop a severe headache.  You have shortness of breath.  You have any kind of trauma, such as from a fall or a car accident. Summary  The first trimester of pregnancy is from week 1 until the end of week 13 (months 1 through 3).  Your body goes through many changes during pregnancy. The changes vary from  woman to woman.  You will have routine prenatal visits. During those visits, your health care provider will examine you, discuss any test results you may have, and talk with you about how you are feeling. This information is not intended to replace advice given to you by your health care provider. Make sure you discuss any questions you have with your health care provider. Document Released: 07/02/2001 Document Revised: 06/19/2016 Document Reviewed: 06/19/2016 Elsevier Interactive Patient Education  2018 [employer]vier   Inc.  

## 2018-03-13 LAB — BETA HCG QUANT (REF LAB): hCG Quant: 10768 m[IU]/mL

## 2018-03-13 LAB — PROGESTERONE: PROGESTERONE: 14.9 ng/mL

## 2018-03-26 ENCOUNTER — Ambulatory Visit (HOSPITAL_COMMUNITY)
Admission: RE | Admit: 2018-03-26 | Discharge: 2018-03-26 | Disposition: A | Discharge status: Home / Self Care | Payer: Commercial Managed Care - PPO | Source: Ambulatory Visit | Attending: Adult Health | Admitting: Adult Health

## 2018-03-26 DIAGNOSIS — Z3A01 Less than 8 weeks gestation of pregnancy: Secondary | ICD-10-CM | POA: Insufficient documentation

## 2018-03-26 DIAGNOSIS — O3680X0 Pregnancy with inconclusive fetal viability, not applicable or unspecified: Secondary | ICD-10-CM | POA: Diagnosis not present

## 2018-03-27 ENCOUNTER — Telehealth: Payer: Self-pay | Admitting: Obstetrics & Gynecology

## 2018-03-27 NOTE — Telephone Encounter (Signed)
Patient called, stated that she had an ultrasound yesterday at [facility] and that she was told someone would call her yesterday evening.  She stated that no one has called yet.  She'd like a call back.  [phone] - home [phone] - cell

## 2018-03-27 NOTE — Telephone Encounter (Signed)
LMOVM that due date is 11/06/18 and she is currently 7wks 6 days. Advised to call back with any further questions.

## 2018-04-09 ENCOUNTER — Ambulatory Visit (INDEPENDENT_AMBULATORY_CARE_PROVIDER_SITE_OTHER): Payer: Commercial Managed Care - PPO | Admitting: Advanced Practice Midwife

## 2018-04-09 ENCOUNTER — Ambulatory Visit: Payer: Commercial Managed Care - PPO | Admitting: *Deleted

## 2018-04-09 ENCOUNTER — Encounter: Payer: Self-pay | Admitting: Advanced Practice Midwife

## 2018-04-09 VITALS — BP 142/78 | HR 94 | Wt 214.0 lb

## 2018-04-09 DIAGNOSIS — Z3682 Encounter for antenatal screening for nuchal translucency: Secondary | ICD-10-CM

## 2018-04-09 DIAGNOSIS — O0990 Supervision of high risk pregnancy, unspecified, unspecified trimester: Secondary | ICD-10-CM | POA: Insufficient documentation

## 2018-04-09 DIAGNOSIS — Z23 Encounter for immunization: Secondary | ICD-10-CM

## 2018-04-09 DIAGNOSIS — Z331 Pregnant state, incidental: Secondary | ICD-10-CM

## 2018-04-09 DIAGNOSIS — Z3481 Encounter for supervision of other normal pregnancy, first trimester: Secondary | ICD-10-CM

## 2018-04-09 DIAGNOSIS — Z1389 Encounter for screening for other disorder: Secondary | ICD-10-CM

## 2018-04-09 DIAGNOSIS — O99331 Smoking (tobacco) complicating pregnancy, first trimester: Secondary | ICD-10-CM

## 2018-04-09 DIAGNOSIS — F17200 Nicotine dependence, unspecified, uncomplicated: Secondary | ICD-10-CM

## 2018-04-09 DIAGNOSIS — Z3A09 9 weeks gestation of pregnancy: Secondary | ICD-10-CM

## 2018-04-09 DIAGNOSIS — Z8632 Personal history of gestational diabetes: Secondary | ICD-10-CM

## 2018-04-09 DIAGNOSIS — O09291 Supervision of pregnancy with other poor reproductive or obstetric history, first trimester: Secondary | ICD-10-CM

## 2018-04-09 LAB — POCT URINALYSIS DIPSTICK OB
Blood, UA: NEGATIVE
Glucose, UA: NEGATIVE
Ketones, UA: NEGATIVE
LEUKOCYTES UA: NEGATIVE
NITRITE UA: NEGATIVE

## 2018-04-09 NOTE — Patient Instructions (Signed)
 First Trimester of Pregnancy The first trimester of pregnancy is from week 1 until the end of week 12 (months 1 through 3). A week after a sperm fertilizes an egg, the egg will implant on the wall of the uterus. This embryo will begin to develop into a baby. Genes from you and your partner are forming the baby. The female genes determine whether the baby is a boy or a girl. At 6-8 weeks, the eyes and face are formed, and the heartbeat can be seen on ultrasound. At the end of 12 weeks, all the baby's organs are formed.  Now that you are pregnant, you will want to do everything you can to have a healthy baby. Two of the most important things are to get good prenatal care and to follow your health care provider's instructions. Prenatal care is all the medical care you receive before the baby's birth. This care will help prevent, find, and treat any problems during the pregnancy and childbirth. BODY CHANGES Your body goes through many changes during pregnancy. The changes vary from woman to woman.   You may gain or lose a couple of pounds at first.  You may feel sick to your stomach (nauseous) and throw up (vomit). If the vomiting is uncontrollable, call your health care provider.  You may tire easily.  You may develop headaches that can be relieved by medicines approved by your health care provider.  You may urinate more often. Painful urination may mean you have a bladder infection.  You may develop heartburn as a result of your pregnancy.  You may develop constipation because certain hormones are causing the muscles that push waste through your intestines to slow down.  You may develop hemorrhoids or swollen, bulging veins (varicose veins).  Your breasts may begin to grow larger and become tender. Your nipples may stick out more, and the tissue that surrounds them (areola) may become darker.  Your gums may bleed and may be sensitive to brushing and flossing.  Dark spots or blotches  (chloasma, mask of pregnancy) may develop on your face. This will likely fade after the baby is born.  Your menstrual periods will stop.  You may have a loss of appetite.  You may develop cravings for certain kinds of food.  You may have changes in your emotions from day to day, such as being excited to be pregnant or being concerned that something may go wrong with the pregnancy and baby.  You may have more vivid and strange dreams.  You may have changes in your hair. These can include thickening of your hair, rapid growth, and changes in texture. Some women also have hair loss during or after pregnancy, or hair that feels dry or thin. Your hair will most likely return to normal after your baby is born. WHAT TO EXPECT AT YOUR PRENATAL VISITS During a routine prenatal visit:  You will be weighed to make sure you and the baby are growing normally.  Your blood pressure will be taken.  Your abdomen will be measured to track your baby's growth.  The fetal heartbeat will be listened to starting around week 10 or 12 of your pregnancy.  Test results from any previous visits will be discussed. Your health care provider may ask you:  How you are feeling.  If you are feeling the baby move.  If you have had any abnormal symptoms, such as leaking fluid, bleeding, severe headaches, or abdominal cramping.  If you have any questions. Other   tests that may be performed during your first trimester include:  Blood tests to find your blood type and to check for the presence of any previous infections. They will also be used to check for low iron levels (anemia) and Rh antibodies. Later in the pregnancy, blood tests for diabetes will be done along with other tests if problems develop.  Urine tests to check for infections, diabetes, or protein in the urine.  An ultrasound to confirm the proper growth and development of the baby.  An amniocentesis to check for possible genetic problems.  Fetal  screens for spina bifida and Down syndrome.  You may need other tests to make sure you and the baby are doing well. HOME CARE INSTRUCTIONS  Medicines  Follow your health care provider's instructions regarding medicine use. Specific medicines may be either safe or unsafe to take during pregnancy.  Take your prenatal vitamins as directed.  If you develop constipation, try taking a stool softener if your health care provider approves. Diet  Eat regular, well-balanced meals. Choose a variety of foods, such as meat or vegetable-based protein, fish, milk and low-fat dairy products, vegetables, fruits, and whole grain breads and cereals. Your health care provider will help you determine the amount of weight gain that is right for you.  Avoid raw meat and uncooked cheese. These carry germs that can cause birth defects in the baby.  Eating four or five small meals rather than three large meals a day may help relieve nausea and vomiting. If you start to feel nauseous, eating a few soda crackers can be helpful. Drinking liquids between meals instead of during meals also seems to help nausea and vomiting.  If you develop constipation, eat more high-fiber foods, such as fresh vegetables or fruit and whole grains. Drink enough fluids to keep your urine clear or pale yellow. Activity and Exercise  Exercise only as directed by your health care provider. Exercising will help you:  Control your weight.  Stay in shape.  Be prepared for labor and delivery.  Experiencing pain or cramping in the lower abdomen or low back is a good sign that you should stop exercising. Check with your health care provider before continuing normal exercises.  Try to avoid standing for long periods of time. Move your legs often if you must stand in one place for a long time.  Avoid heavy lifting.  Wear low-heeled shoes, and practice good posture.  You may continue to have sex unless your health care provider directs you  otherwise. Relief of Pain or Discomfort  Wear a good support bra for breast tenderness.   Take warm sitz baths to soothe any pain or discomfort caused by hemorrhoids. Use hemorrhoid cream if your health care provider approves.   Rest with your legs elevated if you have leg cramps or low back pain.  If you develop varicose veins in your legs, wear support hose. Elevate your feet for 15 minutes, 3-4 times a day. Limit salt in your diet. Prenatal Care  Schedule your prenatal visits by the twelfth week of pregnancy. They are usually scheduled monthly at first, then more often in the last 2 months before delivery.  Write down your questions. Take them to your prenatal visits.  Keep all your prenatal visits as directed by your health care provider. Safety  Wear your seat belt at all times when driving.  Make a list of emergency phone numbers, including numbers for family, friends, the hospital, and police and fire departments. General   Tips  Ask your health care provider for a referral to a local prenatal education class. Begin classes no later than at the beginning of month 6 of your pregnancy.  Ask for help if you have counseling or nutritional needs during pregnancy. Your health care provider can offer advice or refer you to specialists for help with various needs.  Do not use hot tubs, steam rooms, or saunas.  Do not douche or use tampons or scented sanitary pads.  Do not cross your legs for long periods of time.  Avoid cat litter boxes and soil used by cats. These carry germs that can cause birth defects in the baby and possibly loss of the fetus by miscarriage or stillbirth.  Avoid all smoking, herbs, alcohol, and medicines not prescribed by your health care provider. Chemicals in these affect the formation and growth of the baby.  Schedule a dentist appointment. At home, brush your teeth with a soft toothbrush and be gentle when you floss. SEEK MEDICAL CARE IF:   You have  dizziness.  You have mild pelvic cramps, pelvic pressure, or nagging pain in the abdominal area.  You have persistent nausea, vomiting, or diarrhea.  You have a bad smelling vaginal discharge.  You have pain with urination.  You notice increased swelling in your face, hands, legs, or ankles. SEEK IMMEDIATE MEDICAL CARE IF:   You have a fever.  You are leaking fluid from your vagina.  You have spotting or bleeding from your vagina.  You have severe abdominal cramping or pain.  You have rapid weight gain or loss.  You vomit blood or material that looks like coffee grounds.  You are exposed to German measles and have never had them.  You are exposed to fifth disease or chickenpox.  You develop a severe headache.  You have shortness of breath.  You have any kind of trauma, such as from a fall or a car accident. Document Released: 07/02/2001 Document Revised: 11/22/2013 Document Reviewed: 05/18/2013 [employer] Patient Information 2015 [employer]. This information is not intended to replace advice given to you by your health care provider. Make sure you discuss any questions you have with your health care provider.   Nausea & Vomiting  Have saltine crackers or pretzels by your bed and eat a few bites before you raise your head out of bed in the morning  Eat small frequent meals throughout the day instead of large meals  Drink plenty of fluids throughout the day to stay hydrated, just don't drink a lot of fluids with your meals.  This can make your stomach fill up faster making you feel sick  Do not brush your teeth right after you eat  Products with real ginger are good for nausea, like ginger ale and ginger hard candy Make sure it says made with real ginger!  Sucking on sour candy like lemon heads is also good for nausea  If your prenatal vitamins make you nauseated, take them at night so you will sleep through the nausea  Sea Bands  If you feel like you need  medicine for the nausea & vomiting please let us know  If you are unable to keep any fluids or food down please let us know   Constipation  Drink plenty of fluid, preferably water, throughout the day  Eat foods high in fiber such as fruits, vegetables, and grains  Exercise, such as walking, is a good way to keep your bowels regular  Drink warm fluids, especially warm   prune juice, or decaf coffee  Eat a 1/2 cup of real oatmeal (not instant), 1/2 cup applesauce, and 1/2-1 cup warm prune juice every day  If needed, you may take Colace (docusate sodium) stool softener once or twice a day to help keep the stool soft. If you are pregnant, wait until you are out of your first trimester (12-14 weeks of pregnancy)  If you still are having problems with constipation, you may take Miralax once daily as needed to help keep your bowels regular.  If you are pregnant, wait until you are out of your first trimester (12-14 weeks of pregnancy)  Safe Medications in Pregnancy   Acne: Benzoyl Peroxide Salicylic Acid  Backache/Headache: Tylenol: 2 regular strength every 4 hours OR              2 Extra strength every 6 hours  Colds/Coughs/Allergies: Benadryl (alcohol free) 25 mg every 6 hours as needed Breath right strips Claritin Cepacol throat lozenges Chloraseptic throat spray Cold-Eeze- up to three times per day Cough drops, alcohol free Flonase (by prescription only) Guaifenesin Mucinex Robitussin DM (plain only, alcohol free) Saline nasal spray/drops Sudafed (pseudoephedrine) & Actifed ** use only after [redacted] weeks gestation and if you do not have high blood pressure Tylenol Vicks Vaporub Zinc lozenges Zyrtec   Constipation: Colace Ducolax suppositories Fleet enema Glycerin suppositories Metamucil Milk of magnesia Miralax Senokot Smooth move tea  Diarrhea: Kaopectate Imodium A-D  *NO pepto Bismol  Hemorrhoids: Anusol Anusol HC Preparation  H Tucks  Indigestion: Tums Maalox Mylanta Zantac  Pepcid  Insomnia: Benadryl (alcohol free) 25mg every 6 hours as needed Tylenol PM Unisom, no Gelcaps  Leg Cramps: Tums MagGel  Nausea/Vomiting:  Bonine Dramamine Emetrol Ginger extract Sea bands Meclizine  Nausea medication to take during pregnancy:  Unisom (doxylamine succinate 25 mg tablets) Take one tablet daily at bedtime. If symptoms are not adequately controlled, the dose can be increased to a maximum recommended dose of two tablets daily (1/2 tablet in the morning, 1/2 tablet mid-afternoon and one at bedtime). Vitamin B6 100mg tablets. Take one tablet twice a day (up to 200 mg per day).  Skin Rashes: Aveeno products Benadryl cream or 25mg every 6 hours as needed Calamine Lotion 1% cortisone cream  Yeast infection: Gyne-lotrimin 7 Monistat 7   **If taking multiple medications, please check labels to avoid duplicating the same active ingredients **take medication as directed on the label ** Do not exceed 4000 mg of tylenol in 24 hours **Do not take medications that contain aspirin or ibuprofen      

## 2018-04-09 NOTE — Progress Notes (Signed)
INITIAL OBSTETRICAL VISIT Patient name: Marissa Lane MRN [id number]  Date of birth: [birth year] Chief Complaint:   Initial Prenatal Visit (nausea)  History of Present Illness:   Marissa Lane is a 27 y.o. G67P2002 Caucasian female at [redacted]w[redacted]d by LMP/US with an Estimated Date of Delivery: 11/08/18 being seen today for her initial obstetrical visit.  SHe has CHTN, not on meds now, was on labetalol 100mg  BID last pg.  Her obstetrical history is significant for A2DM.   Today she reports no complaints.  Patient's last menstrual period was 02/01/2018. Last pap 2019. Results were: ASACUS, neg HRHPV, (repeat 12/2018) Review of Systems:   Pertinent items are noted in HPI Denies cramping/contractions, leakage of fluid, vaginal bleeding, abnormal vaginal discharge w/ itching/odor/irritation, headaches, visual changes, shortness of breath, chest pain, abdominal pain, severe nausea/vomiting, or problems with urination or bowel movements unless otherwise stated above.  Pertinent History Reviewed:  Reviewed past medical,surgical, social, obstetrical and family history.  Reviewed problem list, medications and allergies. OB History  Gravida Para Term Preterm AB Living  3 2 2     2   SAB TAB Ectopic Multiple Live Births        0 2    # Outcome Date GA Lbr Len/2nd Weight Sex Delivery Anes PTL Lv  3 Current           2 Term 10/06/16 [redacted]w[redacted]d 09:12 / 00:15 7 lb 3.2 oz (3.265 kg) F Vag-Spont EPI  LIV     Complications: Gestational diabetes, Chronic hypertension  1 Term 09/20/09 [redacted]w[redacted]d  6 lb 8 oz (2.948 kg) F Vag-Spont EPI N LIV   Physical Assessment:  There were no vitals filed for this visit.There is no height or weight on file to calculate BMI.       Physical Examination:  General appearance - well appearing, and in no distress  Mental status - alert, oriented to person, place, and time  Psych:  She has a normal mood and affect  Skin - warm and dry, normal color, no suspicious lesions noted  Chest - effort  normal, all lung fields clear to auscultation bilaterally  Heart - normal rate and regular rhythm  Abdomen - soft, nontender  Extremities:  No swelling or varicosities noted   No results found for this or any previous visit (from the past 24 hour(s)).  Assessment & Plan:  1) High-Risk Pregnancy G3P2002 at [redacted]w[redacted]d with an Estimated Date of Delivery: 11/08/18   2) Initial OB visit  3) CHTN, no meds now.  Will start ASA 162mg  >12 weeks  Meds: No orders of the defined types were placed in this encounter.   Initial labs obtained Continue prenatal vitamins Reviewed n/v relief measures and warning s/s to report Reviewed recommended weight gain based on pre-gravid BMI Encouraged well-balanced diet Genetic Screening discussed Integrated ScreenIntegrated Screen: requested Cystic fibrosis screening discussed declined Ultrasound discussed; fetal survey: requested CCNC completed  Follow-up: Return in about 26 days (around 05/05/2018) for US:NT+1st IT.   Orders Placed This Encounter  Procedures  . GC/Chlamydia Probe Amp  . Urine Culture  . US Fetal Nuchal Translucency Measurement  . Urinalysis, Routine w reflex microscopic  . Obstetric Panel, Including HIV  . POC Urinalysis Dipstick OB    Christin Fudge DNP, CNM 04/09/2018 10:45 AM

## 2018-04-10 LAB — OBSTETRIC PANEL, INCLUDING HIV
ANTIBODY SCREEN: NEGATIVE
BASOS: 0 %
Basophils Absolute: 0 10*3/uL (ref 0.0–0.2)
EOS (ABSOLUTE): 0.1 10*3/uL (ref 0.0–0.4)
EOS: 1 %
HEMATOCRIT: 37.4 % (ref 34.0–46.6)
HEMOGLOBIN: 12.7 g/dL (ref 11.1–15.9)
HEP B S AG: NEGATIVE
HIV SCREEN 4TH GENERATION: NONREACTIVE
IMMATURE GRANS (ABS): 0 10*3/uL (ref 0.0–0.1)
Immature Granulocytes: 0 %
LYMPHS: 22 %
Lymphocytes Absolute: 2.1 10*3/uL (ref 0.7–3.1)
MCH: 30.1 pg (ref 26.6–33.0)
MCHC: 34 g/dL (ref 31.5–35.7)
MCV: 89 fL (ref 79–97)
MONOCYTES: 6 %
Monocytes Absolute: 0.6 10*3/uL (ref 0.1–0.9)
NEUTROS ABS: 6.6 10*3/uL (ref 1.4–7.0)
Neutrophils: 71 %
Platelets: 320 10*3/uL (ref 150–450)
RBC: 4.22 x10E6/uL (ref 3.77–5.28)
RDW: 12.3 % (ref 12.3–15.4)
RPR: NONREACTIVE
RUBELLA: 2.44 {index} (ref >0.99)
Rh Factor: POSITIVE
WBC: 9.4 10*3/uL (ref 3.4–10.8)

## 2018-04-10 LAB — HEMOGLOBIN A1C
ESTIMATED AVERAGE GLUCOSE: 111 mg/dL
HEMOGLOBIN A1C: 5.5 % (ref 4.8–5.6)

## 2018-04-10 LAB — URINALYSIS, ROUTINE W REFLEX MICROSCOPIC
BILIRUBIN UA: NEGATIVE
Glucose, UA: NEGATIVE
LEUKOCYTES UA: NEGATIVE
NITRITE UA: NEGATIVE
PH UA: 6 (ref 5.0–7.5)
RBC UA: NEGATIVE
SPEC GRAV UA: 1.029 (ref 1.005–1.030)
UUROB: 1 mg/dL (ref 0.2–1.0)

## 2018-04-11 LAB — GC/CHLAMYDIA PROBE AMP
Chlamydia trachomatis, NAA: NEGATIVE
Neisseria gonorrhoeae by PCR: NEGATIVE

## 2018-04-11 LAB — URINE CULTURE

## 2018-04-20 ENCOUNTER — Telehealth: Payer: Self-pay | Admitting: *Deleted

## 2018-04-20 MED ORDER — DOXYLAMINE-PYRIDOXINE ER 20-20 MG PO TBCR: 1.0000 | EXTENDED_RELEASE_TABLET | Freq: Every day | ORAL | 8 refills | Status: DC

## 2018-04-20 NOTE — Telephone Encounter (Signed)
Patient states she is having nausea and vomiting.  She is trying to keep fluids down the best she can.  She is requesting nausea medication.  I informed her that if Lyla Son was prescribed it would be sent to [facility] in [place] but could get a sample today.  Please advise.

## 2018-05-04 ENCOUNTER — Ambulatory Visit (INDEPENDENT_AMBULATORY_CARE_PROVIDER_SITE_OTHER): Payer: Commercial Managed Care - PPO | Admitting: Women's Health

## 2018-05-04 ENCOUNTER — Encounter: Payer: Self-pay | Admitting: Women's Health

## 2018-05-04 ENCOUNTER — Ambulatory Visit (INDEPENDENT_AMBULATORY_CARE_PROVIDER_SITE_OTHER): Payer: Commercial Managed Care - PPO

## 2018-05-04 ENCOUNTER — Other Ambulatory Visit: Payer: Self-pay

## 2018-05-04 VITALS — BP 149/87 | HR 106 | Wt 213.0 lb

## 2018-05-04 DIAGNOSIS — Z3A13 13 weeks gestation of pregnancy: Secondary | ICD-10-CM

## 2018-05-04 DIAGNOSIS — Z331 Pregnant state, incidental: Secondary | ICD-10-CM

## 2018-05-04 DIAGNOSIS — Z3682 Encounter for antenatal screening for nuchal translucency: Secondary | ICD-10-CM

## 2018-05-04 DIAGNOSIS — Z1389 Encounter for screening for other disorder: Secondary | ICD-10-CM

## 2018-05-04 DIAGNOSIS — O10919 Unspecified pre-existing hypertension complicating pregnancy, unspecified trimester: Secondary | ICD-10-CM

## 2018-05-04 DIAGNOSIS — Z8632 Personal history of gestational diabetes: Secondary | ICD-10-CM

## 2018-05-04 DIAGNOSIS — O0991 Supervision of high risk pregnancy, unspecified, first trimester: Secondary | ICD-10-CM

## 2018-05-04 LAB — POCT URINALYSIS DIPSTICK OB
Blood, UA: NEGATIVE
GLUCOSE, UA: NEGATIVE
Ketones, UA: NEGATIVE
LEUKOCYTES UA: NEGATIVE
Nitrite, UA: NEGATIVE
POC,PROTEIN,UA: NEGATIVE

## 2018-05-04 NOTE — Progress Notes (Signed)
[place] 13+1 WKS,measurements c/w dates,crl 75.00 mm,fhr 164 bpm,NB present,NT 1.5 mm,anterior placenta gr 0,normal ovaries bilat

## 2018-05-04 NOTE — Patient Instructions (Addendum)
Precious Bard, I greatly value your feedback.  If you receive a survey following your visit with Marissa Lane today, we appreciate you taking the time to fill it out.  Thanks, [name], CNM, WHNP-BC  Begin taking 162mg  (two 81mg  tablets) baby aspirin daily at 12 weeks of pregnancy to decrease risk of preeclampsia during pregnancy   Check your blood pressure 4 times daily, write down, bring with you to appointments. Let Marissa Lane know if consistently >=150 on top or >=95 on bottom   Second Trimester of Pregnancy The second trimester is from week 14 through week 27 (months 4 through 6). The second trimester is often a time when you feel your best. Your body has adjusted to being pregnant, and you begin to feel better physically. Usually, morning sickness has lessened or quit completely, you may have more energy, and you may have an increase in appetite. The second trimester is also a time when the fetus is growing rapidly. At the end of the sixth month, the fetus is about 9 inches long and weighs about 1 pounds. You will likely begin to feel the baby move (quickening) between 16 and 20 weeks of pregnancy. Body changes during your second trimester Your body continues to go through many changes during your second trimester. The changes vary from woman to woman.  Your weight will continue to increase. You will notice your lower abdomen bulging out.  You may begin to get stretch marks on your hips, abdomen, and breasts.  You may develop headaches that can be relieved by medicines. The medicines should be approved by your health care provider.  You may urinate more often because the fetus is pressing on your bladder.  You may develop or continue to have heartburn as a result of your pregnancy.  You may develop constipation because certain hormones are causing the muscles that push waste through your intestines to slow down.  You may develop hemorrhoids or swollen, bulging veins (varicose veins).  You may  have back pain. This is caused by: ? Weight gain. ? Pregnancy hormones that are relaxing the joints in your pelvis. ? A shift in weight and the muscles that support your balance.  Your breasts will continue to grow and they will continue to become tender.  Your gums may bleed and may be sensitive to brushing and flossing.  Dark spots or blotches (chloasma, mask of pregnancy) may develop on your face. This will likely fade after the baby is born.  A dark line from your belly button to the pubic area (linea nigra) may appear. This will likely fade after the baby is born.  You may have changes in your hair. These can include thickening of your hair, rapid growth, and changes in texture. Some women also have hair loss during or after pregnancy, or hair that feels dry or thin. Your hair will most likely return to normal after your baby is born.  What to expect at prenatal visits During a routine prenatal visit:  You will be weighed to make sure you and the fetus are growing normally.  Your blood pressure will be taken.  Your abdomen will be measured to track your baby's growth.  The fetal heartbeat will be listened to.  Any test results from the previous visit will be discussed.  Your health care provider may ask you:  How you are feeling.  If you are feeling the baby move.  If you have had any abnormal symptoms, such as leaking fluid, bleeding, severe  headaches, or abdominal cramping.  If you are using any tobacco products, including cigarettes, chewing tobacco, and electronic cigarettes.  If you have any questions.  Other tests that may be performed during your second trimester include:  Blood tests that check for: ? Low iron levels (anemia). ? High blood sugar that affects pregnant women (gestational diabetes) between 64 and 28 weeks. ? Rh antibodies. This is to check for a protein on red blood cells (Rh factor).  Urine tests to check for infections, diabetes, or protein  in the urine.  An ultrasound to confirm the proper growth and development of the baby.  An amniocentesis to check for possible genetic problems.  Fetal screens for spina bifida and Down syndrome.  HIV (human immunodeficiency virus) testing. Routine prenatal testing includes screening for HIV, unless you choose not to have this test.  Follow these instructions at home: Medicines  Follow your health care provider's instructions regarding medicine use. Specific medicines may be either safe or unsafe to take during pregnancy.  Take a prenatal vitamin that contains at least 600 micrograms (mcg) of folic acid.  If you develop constipation, try taking a stool softener if your health care provider approves. Eating and drinking  Eat a balanced diet that includes fresh fruits and vegetables, whole grains, good sources of protein such as meat, eggs, or tofu, and low-fat dairy. Your health care provider will help you determine the amount of weight gain that is right for you.  Avoid raw meat and uncooked cheese. These carry germs that can cause birth defects in the baby.  If you have low calcium intake from food, talk to your health care provider about whether you should take a daily calcium supplement.  Limit foods that are high in fat and processed sugars, such as fried and sweet foods.  To prevent constipation: ? Drink enough fluid to keep your urine clear or pale yellow. ? Eat foods that are high in fiber, such as fresh fruits and vegetables, whole grains, and beans. Activity  Exercise only as directed by your health care provider. Most women can continue their usual exercise routine during pregnancy. Try to exercise for 30 minutes at least 5 days a week. Stop exercising if you experience uterine contractions.  Avoid heavy lifting, wear low heel shoes, and practice good posture.  A sexual relationship may be continued unless your health care provider directs you otherwise. Relieving pain  and discomfort  Wear a good support bra to prevent discomfort from breast tenderness.  Take warm sitz baths to soothe any pain or discomfort caused by hemorrhoids. Use hemorrhoid cream if your health care provider approves.  Rest with your legs elevated if you have leg cramps or low back pain.  If you develop varicose veins, wear support hose. Elevate your feet for 15 minutes, 3-4 times a day. Limit salt in your diet. Prenatal Care  Write down your questions. Take them to your prenatal visits.  Keep all your prenatal visits as told by your health care provider. This is important. Safety  Wear your seat belt at all times when driving.  Make a list of emergency phone numbers, including numbers for family, friends, the hospital, and police and fire departments. General instructions  Ask your health care provider for a referral to a local prenatal education class. Begin classes no later than the beginning of month 6 of your pregnancy.  Ask for help if you have counseling or nutritional needs during pregnancy. Your health care provider can offer  advice or refer you to specialists for help with various needs.  Do not use hot tubs, steam rooms, or saunas.  Do not douche or use tampons or scented sanitary pads.  Do not cross your legs for long periods of time.  Avoid cat litter boxes and soil used by cats. These carry germs that can cause birth defects in the baby and possibly loss of the fetus by miscarriage or stillbirth.  Avoid all smoking, herbs, alcohol, and unprescribed drugs. Chemicals in these products can affect the formation and growth of the baby.  Do not use any products that contain nicotine or tobacco, such as cigarettes and e-cigarettes. If you need help quitting, ask your health care provider.  Visit your dentist if you have not gone yet during your pregnancy. Use a soft toothbrush to brush your teeth and be gentle when you floss. Contact a health care provider  if:  You have dizziness.  You have mild pelvic cramps, pelvic pressure, or nagging pain in the abdominal area.  You have persistent nausea, vomiting, or diarrhea.  You have a bad smelling vaginal discharge.  You have pain when you urinate. Get help right away if:  You have a fever.  You are leaking fluid from your vagina.  You have spotting or bleeding from your vagina.  You have severe abdominal cramping or pain.  You have rapid weight gain or weight loss.  You have shortness of breath with chest pain.  You notice sudden or extreme swelling of your face, hands, ankles, feet, or legs.  You have not felt your baby move in over an hour.  You have severe headaches that do not go away when you take medicine.  You have vision changes. Summary  The second trimester is from week 14 through week 27 (months 4 through 6). It is also a time when the fetus is growing rapidly.  Your body goes through many changes during pregnancy. The changes vary from woman to woman.  Avoid all smoking, herbs, alcohol, and unprescribed drugs. These chemicals affect the formation and growth your baby.  Do not use any tobacco products, such as cigarettes, chewing tobacco, and e-cigarettes. If you need help quitting, ask your health care provider.  Contact your health care provider if you have any questions. Keep all prenatal visits as told by your health care provider. This is important. This information is not intended to replace advice given to you by your health care provider. Make sure you discuss any questions you have with your health care provider. Document Released: 07/02/2001 Document Revised: 12/14/2015 Document Reviewed: 09/08/2012 Elsevier Interactive Patient Education  2017 [employer].

## 2018-05-04 NOTE — Progress Notes (Signed)
HIGH-RISK PREGNANCY VISIT Patient name: Marissa Lane MRN [id number]  Date of birth: 1991/06/14 Chief Complaint:   High Risk Gestation (NT today)  History of Present Illness:   Marissa Lane is a 27 y.o. [id number] female at [redacted]w[redacted]d with an Estimated Date of Delivery: 11/08/18 being seen today for ongoing management of a high-risk pregnancy complicated by [facility]- no meds currently, h/o GDM.  Today she reports hasn't started asa yet. Quit smoking 3wks ago!!!. Contractions: Irritability. Vag. Bleeding: None.   . denies leaking of fluid.  Review of Systems:   Pertinent items are noted in HPI Denies abnormal vaginal discharge w/ itching/odor/irritation, headaches, visual changes, shortness of breath, chest pain, abdominal pain, severe nausea/vomiting, or problems with urination or bowel movements unless otherwise stated above. Pertinent History Reviewed:  Reviewed past medical,surgical, social, obstetrical and family history.  Reviewed problem list, medications and allergies. Physical Assessment:   Vitals:   05/04/18 1144  BP: (!) 149/87  Pulse: (!) 106  Weight: 213 lb (96.6 kg)  Body mass index is 36.56 kg/m.           Physical Examination:   General appearance: alert, well appearing, and in no distress  Mental status: alert, oriented to person, place, and time  Skin: warm & dry   Extremities: Edema: None    Cardiovascular: normal heart rate noted  Respiratory: normal respiratory effort, no distress  Abdomen: gravid, soft, non-tender  Pelvic: Cervical exam deferred         Fetal Status: Fetal Heart Rate (bpm): 164 u/s        Fetal Surveillance Testing today:  [place] 13+1 WKS,measurements c/w dates,crl 75.00 mm,fhr 164 bpm,NB present,NT 1.5 mm,anterior placenta gr 0,normal ovaries bilat   Results for orders placed or performed in visit on 05/04/18 (from the past 24 hour(s))  POC Urinalysis Dipstick OB   Collection Time: 05/04/18 11:43 AM  Result Value Ref Range   Color, UA     Clarity, UA     Glucose, UA Negative Negative   Bilirubin, UA     Ketones, UA neg    Spec Grav, UA     Blood, UA neg    pH, UA     POC Protein UA Negative Negative, Trace   Urobilinogen, UA     Nitrite, UA neg    Leukocytes, UA Negative Negative   Appearance     Odor      Assessment & Plan:  1) High-risk pregnancy G3P2002 at [redacted]w[redacted]d with an Estimated Date of Delivery: 11/08/18   2) CHTN, no meds currently, start 162mg  ASA today, will get baseline cmp, 24hr urine, check bp's QID at home, bring to appts, let us know if consistently sbp>=150, >=95  3) H/O GDM, early A1C 5.5  Meds: No orders of the defined types were placed in this encounter.  Labs/procedures today: nt/it  Treatment Plan:  Growth u/s @ 20, 28, 34, 38wks     2x/wk testing nst/sono @ 32wks or weekly BPP   Deliver @ 40wks (no meds), 39wks (meds)  Reviewed: Preterm labor symptoms and general obstetric precautions including but not limited to vaginal bleeding, contractions, leaking of fluid and fetal movement were reviewed in detail with the patient.  All questions were answered.  Follow-up: Return in about 3 weeks (around 05/25/2018) for [place], 2nd IT.  Orders Placed This Encounter  Procedures  . Integrated 1  . Comprehensive metabolic panel  . Protein, urine, 24 hour  . POC Urinalysis Dipstick OB  [place], [facility] 05/04/2018 1:29 PM

## 2018-05-25 ENCOUNTER — Encounter: Payer: Commercial Managed Care - PPO | Admitting: Women's Health

## 2018-05-28 ENCOUNTER — Other Ambulatory Visit: Payer: Self-pay

## 2018-05-28 ENCOUNTER — Ambulatory Visit (INDEPENDENT_AMBULATORY_CARE_PROVIDER_SITE_OTHER): Payer: Commercial Managed Care - PPO | Admitting: Advanced Practice Midwife

## 2018-05-28 DIAGNOSIS — Z1389 Encounter for screening for other disorder: Secondary | ICD-10-CM

## 2018-05-28 LAB — COMPREHENSIVE METABOLIC PANEL
ALT: 21 IU/L (ref 0–32)
AST: 18 IU/L (ref 0–40)
Albumin/Globulin Ratio: 1.4 (ref 1.2–2.2)
Albumin: 3.7 g/dL (ref 3.5–5.5)
Alkaline Phosphatase: 42 IU/L (ref 39–117)
BUN/Creatinine Ratio: 11 (ref 9–23)
BUN: 6 mg/dL (ref 6–20)
Bilirubin Total: <0.2 mg/dL (ref 0.0–1.2)
CO2: 18 mmol/L — AB (ref 20–29)
CREATININE: 0.56 mg/dL — AB (ref 0.57–1.00)
Calcium: 9.2 mg/dL (ref 8.7–10.2)
Chloride: 107 mmol/L — ABNORMAL HIGH (ref 96–106)
GFR calc Af Amer: 147 mL/min/{1.73_m2} (ref >59)
GFR calc non Af Amer: 127 mL/min/{1.73_m2} (ref >59)
GLUCOSE: 121 mg/dL — AB (ref 65–99)
Globulin, Total: 2.7 g/dL (ref 1.5–4.5)
Potassium: 3.9 mmol/L (ref 3.5–5.2)
Sodium: 141 mmol/L (ref 134–144)
Total Protein: 6.4 g/dL (ref 6.0–8.5)

## 2018-05-28 LAB — PROTEIN, URINE, 24 HOUR
PROTEIN 24H UR: 134 mg/(24.h) (ref 30–150)
PROTEIN UR: 9.6 mg/dL

## 2018-05-28 MED ORDER — ONDANSETRON 4 MG PO TBDP: 4.0000 mg | ORAL_TABLET | Freq: Four times a day (QID) | ORAL | 2 refills | Status: DC | PRN

## 2018-05-28 NOTE — Progress Notes (Signed)
Watery diarrhea for nearly 2 days.  + ketones.  Vomited once (thinks it was car sickness) No new meds, feels fine otherwise.   rx zofran pRN, take immodium; has appt Monday. If still watery diarrhea, consider stool culture

## 2018-05-29 LAB — INTEGRATED 1
Crown Rump Length: 75 mm
Gest. Age on Collection Date: 16.6 weeks
MATERNAL AGE AT EDD: 28.9 a
Nuchal Translucency (NT): 1.5 mm
Number of Fetuses: 1
PAPP-A Value: 1242.1 ng/mL
Weight: 213 [lb_av]

## 2018-06-01 ENCOUNTER — Encounter: Payer: Commercial Managed Care - PPO | Admitting: Family Medicine

## 2018-06-04 ENCOUNTER — Ambulatory Visit (INDEPENDENT_AMBULATORY_CARE_PROVIDER_SITE_OTHER): Payer: Commercial Managed Care - PPO | Admitting: Family Medicine

## 2018-06-04 ENCOUNTER — Encounter: Payer: Self-pay | Admitting: Family Medicine

## 2018-06-04 VITALS — BP 135/83 | HR 98 | Wt 216.4 lb

## 2018-06-04 DIAGNOSIS — O10919 Unspecified pre-existing hypertension complicating pregnancy, unspecified trimester: Secondary | ICD-10-CM

## 2018-06-04 DIAGNOSIS — Z1379 Encounter for other screening for genetic and chromosomal anomalies: Secondary | ICD-10-CM

## 2018-06-04 DIAGNOSIS — Z331 Pregnant state, incidental: Secondary | ICD-10-CM

## 2018-06-04 DIAGNOSIS — O0990 Supervision of high risk pregnancy, unspecified, unspecified trimester: Secondary | ICD-10-CM

## 2018-06-04 DIAGNOSIS — O99210 Obesity complicating pregnancy, unspecified trimester: Secondary | ICD-10-CM | POA: Insufficient documentation

## 2018-06-04 DIAGNOSIS — Z1389 Encounter for screening for other disorder: Secondary | ICD-10-CM

## 2018-06-04 DIAGNOSIS — E282 Polycystic ovarian syndrome: Secondary | ICD-10-CM

## 2018-06-04 DIAGNOSIS — Z3A17 17 weeks gestation of pregnancy: Secondary | ICD-10-CM

## 2018-06-04 DIAGNOSIS — Z87891 Personal history of nicotine dependence: Secondary | ICD-10-CM

## 2018-06-04 LAB — POCT URINALYSIS DIPSTICK OB
Glucose, UA: NEGATIVE
Ketones, UA: NEGATIVE
LEUKOCYTES UA: NEGATIVE
NITRITE UA: NEGATIVE
PROTEIN: NEGATIVE

## 2018-06-04 NOTE — Progress Notes (Signed)
HIGH-RISK PREGNANCY VISIT Patient name: Marissa Lane MRN [id number]  Date of birth: April 21, 1991 Chief Complaint:   High Risk Gestation (2nd IT)  History of Present Illness:   Marissa Lane is a 27 y.o. [id number] female at [redacted]w[redacted]d  with an Estimated Date of Delivery: 11/08/18 being seen today for ongoing management of a high-risk pregnancy complicated by [facility]- no meds currently, h/o GDM.  Today she reports Started ASA and continues to be smoke free!!!!. Contractions: Not present. Vag. Bleeding: None.  Movement: Absent. denies leaking of fluid.  Review of Systems:   Pertinent items are noted in HPI Denies abnormal vaginal discharge w/ itching/odor/irritation, headaches, visual changes, shortness of breath, chest pain, abdominal pain, severe nausea/vomiting, or problems with urination or bowel movements unless otherwise stated above. Pertinent History Reviewed:  Reviewed past medical,surgical, social, obstetrical and family history.  Reviewed problem list, medications and allergies. Physical Assessment:   Vitals:   06/04/18 1101  BP: 135/83  Pulse: 98  Weight: 216 lb 6.4 oz (98.2 kg)  Body mass index is 37.14 kg/m.           Physical Examination:   General appearance: alert, well appearing, and in no distress  Mental status: alert, oriented to person, place, and time  Skin: warm & dry   Extremities: Edema: None    Cardiovascular: normal heart rate noted  Respiratory: normal respiratory effort, no distress  Abdomen: gravid, soft, non-tender  Pelvic: Cervical exam deferred         Fetal Status: Fetal Heart Rate (bpm): 150   Movement: Absent    Fetal Surveillance Testing today:  [place] 13+1 WKS,measurements c/w dates,crl 75.00 mm,fhr 164 bpm,NB present,NT 1.5 mm,anterior placenta gr 0,normal ovaries bilat   Results for orders placed or performed in visit on 06/04/18 (from the past 24 hour(s))  POC Urinalysis Dipstick OB   Collection Time: 06/04/18 11:02 AM  Result Value Ref Range   Color, UA     Clarity, UA     Glucose, UA Negative Negative   Bilirubin, UA     Ketones, UA neg    Spec Grav, UA     Blood, UA trace    pH, UA     POC,PROTEIN,UA Negative Negative, Trace, Small (1+), Moderate (2+), Large (3+), 4+   Urobilinogen, UA     Nitrite, UA neg    Leukocytes, UA Negative Negative   Appearance     Odor      Assessment & Plan:  1) High-risk pregnancy G3P2002 at [redacted]w[redacted]d  with an Estimated Date of Delivery: 11/08/18 - has repeat AFP today  2) CHTN, no meds currently, taking 162mg  ASA today  3) H/O GDM, early A1C 5.5.   4)Obesity (BMI= Body mass index is 37.14 kg/m.)  Total weight gain= 4 lb 6.4 oz (1.996 kg)    Meds: No orders of the defined types were placed in this encounter.  Labs/procedures today: nt/it  Treatment Plan:  Growth u/s @ 20, 28, 34, 38wks     2x/wk testing nst/sono @ 32wks or weekly BPP   Deliver @ 40wks (no meds), 39wks (meds)  Reviewed: Preterm labor symptoms and general obstetric precautions including but not limited to vaginal bleeding, contractions, leaking of fluid and fetal movement were reviewed in detail with the patient.  All questions were answered.  Follow-up: Return in about 4 weeks (around 07/02/2018) for Routine prenatal care.   Future Appointments  Date Time Provider [place]  06/10/2018  3:00 PM [facility] - [facility]  [place] [facility] None     Orders Placed This Encounter  Procedures  . [place] MFM OB DETAIL +14 WK  . INTEGRATED 2  . POC Urinalysis Dipstick OB   [address] Ernestina Patches CNM, [facility] 06/04/2018 11:32 AM

## 2018-06-09 LAB — AFP TETRA
DIA Mom Value: 1.17
DIA VALUE (EIA): 154.75 pg/mL
DSR (By Age)    1 IN: 789
DSR (SECOND TRIMESTER) 1 IN: 7493
Gestational Age: 17.5 WEEKS
MATERNAL AGE AT EDD: 28.9 a
MSAFP Mom: 0.95
MSAFP: 30.7 ng/mL
MSHCG Mom: 2.47
MSHCG: 58883 m[IU]/mL
OSB RISK: 10000
T18 (By Age): 1:3074 {titer}
Test Results:: NEGATIVE
UE3 MOM: 1.48
UE3 VALUE: 1.63 ng/mL
Weight: 216 [lb_av]

## 2018-06-10 ENCOUNTER — Ambulatory Visit (INDEPENDENT_AMBULATORY_CARE_PROVIDER_SITE_OTHER): Payer: Commercial Managed Care - PPO

## 2018-06-10 ENCOUNTER — Other Ambulatory Visit: Payer: Self-pay | Admitting: Family Medicine

## 2018-06-10 DIAGNOSIS — Z363 Encounter for antenatal screening for malformations: Secondary | ICD-10-CM

## 2018-06-10 DIAGNOSIS — O0990 Supervision of high risk pregnancy, unspecified, unspecified trimester: Secondary | ICD-10-CM

## 2018-06-10 DIAGNOSIS — O99210 Obesity complicating pregnancy, unspecified trimester: Secondary | ICD-10-CM

## 2018-06-10 NOTE — Progress Notes (Signed)
[place] 74+2 wks,cephalic,cx 3.6 cm,anterior placenta gr 0,normal ovaries bilat,svp 4 cm,fhr 157 bpm,efw 274 g 82%,anatomy complete,no obvious abnormalities

## 2018-07-03 ENCOUNTER — Encounter: Payer: Commercial Managed Care - PPO | Admitting: Obstetrics & Gynecology

## 2018-07-08 ENCOUNTER — Other Ambulatory Visit: Payer: Self-pay

## 2018-07-08 ENCOUNTER — Ambulatory Visit (INDEPENDENT_AMBULATORY_CARE_PROVIDER_SITE_OTHER): Payer: Commercial Managed Care - PPO | Admitting: Advanced Practice Midwife

## 2018-07-08 ENCOUNTER — Encounter: Payer: Self-pay | Admitting: Advanced Practice Midwife

## 2018-07-08 VITALS — BP 129/82 | HR 109 | Wt 224.0 lb

## 2018-07-08 DIAGNOSIS — Z1389 Encounter for screening for other disorder: Secondary | ICD-10-CM

## 2018-07-08 DIAGNOSIS — O10919 Unspecified pre-existing hypertension complicating pregnancy, unspecified trimester: Secondary | ICD-10-CM

## 2018-07-08 DIAGNOSIS — Z331 Pregnant state, incidental: Secondary | ICD-10-CM

## 2018-07-08 DIAGNOSIS — Z3A22 22 weeks gestation of pregnancy: Secondary | ICD-10-CM

## 2018-07-08 DIAGNOSIS — O0992 Supervision of high risk pregnancy, unspecified, second trimester: Secondary | ICD-10-CM

## 2018-07-08 LAB — POCT URINALYSIS DIPSTICK OB
GLUCOSE, UA: NEGATIVE
KETONES UA: NEGATIVE
Leukocytes, UA: NEGATIVE
Nitrite, UA: NEGATIVE
POC,PROTEIN,UA: NEGATIVE
RBC UA: NEGATIVE

## 2018-07-08 NOTE — Progress Notes (Signed)
HIGH-RISK PREGNANCY VISIT Patient name: Marissa Lane MRN [id number]  Date of birth: [birth year] Chief Complaint:   High Risk Gestation  History of Present Illness:   Marissa Lane is a 27 y.o. G74P2002 female at [redacted]w[redacted]d with an Estimated Date of Delivery: 11/08/18 being seen today for ongoing management of a high-risk pregnancy complicated by chronic HTN. No meds.   Today she reports heartburn, tried all OTC except Nexium.  Takes stool softenrs /miralax for constipation. . Contractions: Not present. Vag. Bleeding: None.  Movement: Present. denies leaking of fluid.  Review of Systems:   Pertinent items are noted in HPI Denies abnormal vaginal discharge w/ itching/odor/irritation, headaches, visual changes, shortness of breath, chest pain, abdominal pain, severe nausea/vomiting, or problems with urination or bowel movements unless otherwise stated above.    Pertinent History Reviewed:  Medical & Surgical Hx:   Past Medical History:  Diagnosis Date  . Acne   . Chest pain   . Chronic hypertension   . Diabetes mellitus without complication ([place])   . 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 GERD (gastroesophageal reflux disease)   . Gestational diabetes    metformin  . H/O urinary frequency 06/25/2013  . Irregular intermenstrual bleeding 10/12/2013  . Obesity   . Palpitations   . PCOS (polycystic ovarian syndrome)   . Postpartum depression   . Tobacco abuse    Past Surgical History:  Procedure Laterality Date  . CHOLECYSTECTOMY  03/06/2012   Procedure: LAPAROSCOPIC CHOLECYSTECTOMY;  Surgeon: Donato Heinz, MD;  Location: [facility] ORS;  Service: General;  Laterality: N/A;  . ESOPHAGOGASTRODUODENOSCOPY N/A 09/09/2012   Procedure: ESOPHAGOGASTRODUODENOSCOPY (EGD);  Surgeon: Rogene Houston, MD;  Location: [facility] ENDO SUITE;  Service: Endoscopy;  Laterality: N/A;  340  . MOUTH SURGERY    . SKIN GRAFT  2009   gum graft  . TONSILLECTOMY     Family History  Adopted: Yes  Problem Relation Age of  Onset  . Congestive Heart Failure Mother   . Irritable bowel syndrome Mother   . Ulcers Mother   . Anemia Mother   . Hypertension Brother   . Colon cancer Maternal Grandmother 69  . Heart disease Maternal Grandfather 40       MI    Current Outpatient Medications:  .  aspirin EC 81 MG tablet, Take 162 mg by mouth daily., Disp: , Rfl:  .  Prenatal Multivit-Min-Fe-FA (PRENATAL VITAMINS PO), Take by mouth., Disp: , Rfl:  .  Doxylamine-Pyridoxine ER (BONJESTA) 20-20 MG TBCR, Take 1 tablet by mouth at bedtime. Can add 1 tablet in the morning if needed for nausea and vomiting (Patient not taking: Reported on 07/08/2018), Disp: 60 tablet, Rfl: 8 Social History: Reviewed -  reports that she has quit smoking. Her smoking use included cigarettes. She has a 2.50 pack-year smoking history. She has never used smokeless tobacco.   Physical Assessment:   Vitals:   07/08/18 1015  BP: 129/82  Pulse: (!) 109  Weight: 224 lb (101.6 kg)  Body mass index is 38.45 kg/m.           Physical Examination:   General appearance: alert, well appearing, and in no distress  Mental status: alert, oriented to person, place, and time  Skin: warm & dry   Extremities: Edema: Trace    Cardiovascular: normal heart rate noted  Respiratory: normal respiratory effort, no distress  Abdomen: gravid, soft, non-tender  Pelvic: Cervical exam deferred  Fetal Status:     Movement: Present    Fetal Surveillance Testing today: doppler  Results for orders placed or performed in visit on 07/08/18 (from the past 24 hour(s))  POC Urinalysis Dipstick OB   Collection Time: 07/08/18 10:14 AM  Result Value Ref Range   Color, UA     Clarity, UA     Glucose, UA Negative Negative   Bilirubin, UA     Ketones, UA neg    Spec Grav, UA     Blood, UA neg    pH, UA     POC,PROTEIN,UA Negative Negative, Trace, Small (1+), Moderate (2+), Large (3+), 4+   Urobilinogen, UA     Nitrite, UA neg    Leukocytes, UA Negative  Negative   Appearance     Odor      Assessment & Plan:  1) High-risk pregnancy G3P2002 at [redacted]w[redacted]d with an Estimated Date of Delivery: 11/08/18   2) CHTN, no meds, .  Treatment Plan:   Growth u/s @  28, 34, 38wks     2x/wk testing nst/sono @ 32wks or weekly BPP   Deliver @ 40wks (no meds), 39wks (meds)  3) obesity, TWG 12 lbs    Follow-up: Return in about 4 weeks (around 08/05/2018) for PN2/LROB.  No future appointments.  Orders Placed This Encounter  Procedures  . POC Urinalysis Dipstick OB   Christin Fudge CNM 07/08/2018 10:36 AM

## 2018-07-08 NOTE — Patient Instructions (Signed)

## 2018-07-22 NOTE — L&D Delivery Note (Signed)
    Delivery Note Pt had pressure/urge to push and was noted to be C/C/+3 station.  After a brief 2nd stage, at 9:16 PM a viable female was delivered via Vaginal, Spontaneous (Presentation:ROA, loose nuchal, reduced).  APGAR: 8/9; weight pending. After the head delivered, the posterior (right) axilla was grasped and baby rotated into the oblique diameter, where the shoulders easily delivered. At no time was traction placed upon the fetal head.  After 1 minute, the cord was clamped and cut. 40 units of pitocin diluted in 1000cc LR was infused rapidly IV.  The placenta separated spontaneously and delivered via CCT and maternal pushing effort.  It was inspected and appears to be intact with a 3 VC.  Marland Kitchen    Anesthesia:  epidural Episiotomy:  none Lacerations:  none Suture Repair:  Est. Blood Loss (mL):  44  Mom to postpartum.  Baby to Couplet care / Skin to Skin.  Christin Fudge 10/29/2018, 9:42 PM

## 2018-08-04 ENCOUNTER — Other Ambulatory Visit: Payer: Commercial Managed Care - PPO

## 2018-08-04 ENCOUNTER — Encounter: Payer: Self-pay | Admitting: Obstetrics & Gynecology

## 2018-08-04 ENCOUNTER — Ambulatory Visit (INDEPENDENT_AMBULATORY_CARE_PROVIDER_SITE_OTHER): Payer: Commercial Managed Care - PPO | Admitting: Obstetrics & Gynecology

## 2018-08-04 VITALS — BP 113/73 | HR 100 | Wt 229.0 lb

## 2018-08-04 DIAGNOSIS — O10912 Unspecified pre-existing hypertension complicating pregnancy, second trimester: Secondary | ICD-10-CM

## 2018-08-04 DIAGNOSIS — Z1389 Encounter for screening for other disorder: Secondary | ICD-10-CM

## 2018-08-04 DIAGNOSIS — Z3A26 26 weeks gestation of pregnancy: Secondary | ICD-10-CM

## 2018-08-04 DIAGNOSIS — O10919 Unspecified pre-existing hypertension complicating pregnancy, unspecified trimester: Secondary | ICD-10-CM

## 2018-08-04 DIAGNOSIS — Z331 Pregnant state, incidental: Secondary | ICD-10-CM

## 2018-08-04 DIAGNOSIS — O0992 Supervision of high risk pregnancy, unspecified, second trimester: Secondary | ICD-10-CM

## 2018-08-04 LAB — POCT URINALYSIS DIPSTICK OB
Blood, UA: NEGATIVE
Glucose, UA: NEGATIVE
Ketones, UA: NEGATIVE
Leukocytes, UA: NEGATIVE
Nitrite, UA: NEGATIVE
POC,PROTEIN,UA: NEGATIVE

## 2018-08-04 NOTE — Progress Notes (Signed)
Clear and thin vaginal discharge. No odor.

## 2018-08-04 NOTE — Progress Notes (Signed)
Patient ID: Marissa Lane, female   DOB: 27-Nov-1990, 28 y.o.   MRN: [id number]   LOW-RISK PREGNANCY VISIT Patient name: Marissa Lane MRN [id number]  Date of birth: [birth year] Chief Complaint:   High Risk Gestation (PN2 today; swelling in hands, calves, ankles and feet; leaking fluid )  History of Present Illness:   Marissa Lane is a 28 y.o. G56P2002 female at [redacted]w[redacted]d with an Estimated Date of Delivery: 11/08/18 being seen today for ongoing management of a low-risk pregnancy.  Today she reports some discharge vs bladder leakage. Contractions: Not present. Vag. Bleeding: None.  Movement: Present. unsure leaking of fluid. Review of Systems:   Pertinent items are noted in HPI Denies abnormal vaginal discharge w/ itching/odor/irritation, headaches, visual changes, shortness of breath, chest pain, abdominal pain, severe nausea/vomiting, or problems with urination or bowel movements unless otherwise stated above. Pertinent History Reviewed:  Reviewed past medical,surgical, social, obstetrical and family history.  Reviewed problem list, medications and allergies. Physical Assessment:   Vitals:   08/04/18 0905  BP: 113/73  Pulse: 100  Weight: 229 lb (103.9 kg)  Body mass index is 39.31 kg/m.        Physical Examination:   General appearance: Well appearing, and in no distress  Mental status: Alert, oriented to person, place, and time  Skin: Warm & dry  Cardiovascular: Normal heart rate noted  Respiratory: Normal respiratory effort, no distress  Abdomen: Soft, gravid, nontender  Pelvic: Cervical exam deferred         Extremities: Edema: Deep pitting, indentation remains for a short time SSE no pool no ferning Fetal Status:     Movement: Present    Results for orders placed or performed in visit on 08/04/18 (from the past 24 hour(s))  POC Urinalysis Dipstick OB   Collection Time: 08/04/18  9:07 AM  Result Value Ref Range   Color, UA     Clarity, UA     Glucose, UA Negative Negative   Bilirubin, UA     Ketones, UA neg    Spec Grav, UA     Blood, UA neg    pH, UA     POC,PROTEIN,UA Negative Negative, Trace, Small (1+), Moderate (2+), Large (3+), 4+   Urobilinogen, UA     Nitrite, UA neg    Leukocytes, UA Negative Negative   Appearance     Odor      Assessment & Plan:  1) Low-risk pregnancy G3P2002 at [redacted]w[redacted]d with an Estimated Date of Delivery: 11/08/18   ?chronic hypertension< no meds yet and BP really good, no indication for surveillance or management as CHTN as of yet     Meds: No orders of the defined types were placed in this encounter.  Labs/procedures today: PN2 being done  Plan:  Continue routine obstetrical care   Reviewed: Preterm labor symptoms and general obstetric precautions including but not limited to vaginal bleeding, contractions, leaking of fluid and fetal movement were reviewed in detail with the patient.  All questions were answered  Follow-up: Return in about 3 weeks (around 08/25/2018) for LROB.  Orders Placed This Encounter  Procedures  . POC Urinalysis Dipstick OB   Florian Buff  08/04/2018 9:52 AM

## 2018-08-05 ENCOUNTER — Telehealth: Payer: Self-pay | Admitting: *Deleted

## 2018-08-05 LAB — CBC
HEMOGLOBIN: 12.2 g/dL (ref 11.1–15.9)
Hematocrit: 36.1 % (ref 34.0–46.6)
MCH: 29.9 pg (ref 26.6–33.0)
MCHC: 33.8 g/dL (ref 31.5–35.7)
MCV: 89 fL (ref 79–97)
Platelets: 292 10*3/uL (ref 150–450)
RBC: 4.08 x10E6/uL (ref 3.77–5.28)
RDW: 12.6 % (ref 11.7–15.4)
WBC: 10.8 10*3/uL (ref 3.4–10.8)

## 2018-08-05 LAB — ANTIBODY SCREEN: Antibody Screen: NEGATIVE

## 2018-08-05 LAB — GLUCOSE TOLERANCE, 2 HOURS W/ 1HR
Glucose, 1 hour: 216 mg/dL — ABNORMAL HIGH (ref 65–179)
Glucose, 2 hour: 176 mg/dL — ABNORMAL HIGH (ref 65–152)
Glucose, Fasting: 99 mg/dL — ABNORMAL HIGH (ref 65–91)

## 2018-08-05 LAB — RPR: RPR Ser Ql: NONREACTIVE

## 2018-08-05 LAB — HIV ANTIBODY (ROUTINE TESTING W REFLEX): HIV Screen 4th Generation wRfx: NONREACTIVE

## 2018-08-05 NOTE — Telephone Encounter (Signed)
Unable to leave message

## 2018-08-06 ENCOUNTER — Telehealth: Payer: Self-pay | Admitting: *Deleted

## 2018-08-06 ENCOUNTER — Encounter: Payer: Self-pay | Admitting: *Deleted

## 2018-08-06 DIAGNOSIS — O24410 Gestational diabetes mellitus in pregnancy, diet controlled: Secondary | ICD-10-CM

## 2018-08-06 MED ORDER — GLUCOSE BLOOD VI STRP: ORAL_STRIP | 12 refills | Status: DC

## 2018-08-06 MED ORDER — ACCU-CHEK GUIDE W/DEVICE KIT: 1.0000 | PACK | Freq: Four times a day (QID) | 0 refills | Status: DC

## 2018-08-06 MED ORDER — ACCU-CHEK FASTCLIX LANCETS MISC: 1.0000 | Freq: Four times a day (QID) | 12 refills | Status: DC

## 2018-08-06 NOTE — Telephone Encounter (Signed)
Unable to leave message. Will send mychart message.

## 2018-08-19 ENCOUNTER — Telehealth: Payer: Self-pay | Admitting: Obstetrics & Gynecology

## 2018-08-19 NOTE — Telephone Encounter (Signed)
Patient called stating that she would like one of the doctor's to call her something in for her Hemorrhoids. Pt states that she has had this before from her last pregnancy. Please contact pt

## 2018-08-20 ENCOUNTER — Other Ambulatory Visit: Payer: Self-pay | Admitting: Advanced Practice Midwife

## 2018-08-20 MED ORDER — HYDROCORTISONE ACE-PRAMOXINE 1-1 % RE CREA: 1.0000 "application " | TOPICAL_CREAM | Freq: Two times a day (BID) | RECTAL | 0 refills | Status: DC

## 2018-08-20 NOTE — Telephone Encounter (Signed)
Attempted to notify pt that Rx had been sent to pharmacy. Mailbox full, unable to leave message

## 2018-08-20 NOTE — Telephone Encounter (Signed)
done

## 2018-08-26 ENCOUNTER — Ambulatory Visit (INDEPENDENT_AMBULATORY_CARE_PROVIDER_SITE_OTHER): Payer: Commercial Managed Care - PPO | Admitting: Advanced Practice Midwife

## 2018-08-26 ENCOUNTER — Encounter: Payer: Self-pay | Admitting: Advanced Practice Midwife

## 2018-08-26 VITALS — BP 134/88 | HR 115 | Wt 232.0 lb

## 2018-08-26 DIAGNOSIS — O24419 Gestational diabetes mellitus in pregnancy, unspecified control: Secondary | ICD-10-CM

## 2018-08-26 DIAGNOSIS — Z1389 Encounter for screening for other disorder: Secondary | ICD-10-CM

## 2018-08-26 DIAGNOSIS — O99210 Obesity complicating pregnancy, unspecified trimester: Secondary | ICD-10-CM

## 2018-08-26 DIAGNOSIS — O10913 Unspecified pre-existing hypertension complicating pregnancy, third trimester: Secondary | ICD-10-CM

## 2018-08-26 DIAGNOSIS — O10919 Unspecified pre-existing hypertension complicating pregnancy, unspecified trimester: Secondary | ICD-10-CM

## 2018-08-26 DIAGNOSIS — Z3A29 29 weeks gestation of pregnancy: Secondary | ICD-10-CM

## 2018-08-26 DIAGNOSIS — Z331 Pregnant state, incidental: Secondary | ICD-10-CM

## 2018-08-26 DIAGNOSIS — O24415 Gestational diabetes mellitus in pregnancy, controlled by oral hypoglycemic drugs: Secondary | ICD-10-CM

## 2018-08-26 DIAGNOSIS — O99213 Obesity complicating pregnancy, third trimester: Secondary | ICD-10-CM

## 2018-08-26 DIAGNOSIS — O0993 Supervision of high risk pregnancy, unspecified, third trimester: Secondary | ICD-10-CM

## 2018-08-26 LAB — POCT URINALYSIS DIPSTICK OB
GLUCOSE, UA: NEGATIVE
Ketones, UA: NEGATIVE
Leukocytes, UA: NEGATIVE
Nitrite, UA: NEGATIVE
RBC UA: NEGATIVE

## 2018-08-26 MED ORDER — METFORMIN HCL 500 MG PO TABS: 500.0000 mg | ORAL_TABLET | Freq: Two times a day (BID) | ORAL | 4 refills | Status: DC

## 2018-08-26 NOTE — Progress Notes (Signed)
HIGH-RISK PREGNANCY VISIT Patient name: Marissa Lane MRN [id number]  Date of birth: 07-03-1991 Chief Complaint:   High Risk Gestation  History of Present Illness:   Marissa Lane is a 28 y.o. G48P2002 female at 57w3dwith an Estimated Date of Delivery: 11/08/18 being seen today for ongoing management of a high-risk pregnancy complicated by chronic HTN, gestational DM. She works 3rd shift.  All FBS are at least 6 hours fasting, all but 1 >92 (usually low 100's).  Most PP are also >120. Today she reports no complaints. Contractions: Irregular.  .  Movement: Present. denies leaking of fluid.  Review of Systems:   Pertinent items are noted in HPI Denies abnormal vaginal discharge w/ itching/odor/irritation, headaches, visual changes, shortness of breath, chest pain, abdominal pain, severe nausea/vomiting, or problems with urination or bowel movements unless otherwise stated above.    Pertinent History Reviewed:  Medical & Surgical Hx:   Past Medical History:  Diagnosis Date  . Acne   . Chest pain   . Chronic hypertension   . [place] mellitus [place] complication [place]   . Dysthymic disorder    over dose age 28 . GERD (gastroesophageal reflux disease)   . Gestational diabetes    metformin  . H/O urinary frequency 06/25/2013  . Irregular intermenstrual bleeding 10/12/2013  . Obesity   . Palpitations   . PCOS (polycystic ovarian syndrome)   . Postpartum depression   . Tobacco abuse    Past Surgical History:  Procedure Laterality Date  . CHOLECYSTECTOMY  03/06/2012   Procedure: LAPAROSCOPIC CHOLECYSTECTOMY;  Surgeon: BDonato Heinz MD;  Location: [facility];  Service: General;  Laterality: N/A;  . ESOPHAGOGASTRODUODENOSCOPY N/A 09/09/2012   Procedure: ESOPHAGOGASTRODUODENOSCOPY (EGD);  Surgeon: NRogene Houston MD;  Location: [facility];  Service: Endoscopy;  Laterality: N/A;  340  . MOUTH SURGERY    . SKIN GRAFT  2009   gum graft  . TONSILLECTOMY     Family History  Adopted: Yes   Problem Relation Age of Onset  . Congestive Heart Failure Mother   . Irritable bowel syndrome Mother   . Ulcers Mother   . Anemia Mother   . Hypertension Brother   . Colon cancer Maternal Grandmother 430 . Heart disease Maternal Grandfather 40       MI    Current Outpatient Medications:  .  ACCU-CHEK FASTCLIX LANCETS MISC, 1 each by Percutaneous route 4 (four) times daily., Disp: 100 each, Rfl: 12 .  aspirin EC 81 MG tablet, Take 162 mg by mouth daily., Disp: , Rfl:  .  Blood Glucose Monitoring Suppl (ACCU-CHEK GUIDE) w/Device KIT, 1 each by Does not apply route 4 (four) times daily., Disp: 1 kit, Rfl: 0 .  Esomeprazole Magnesium (NEXIUM PO), Take by mouth daily., Disp: , Rfl:  .  glucose blood (ACCU-CHEK GUIDE) test strip, Use as instructed to check blood sugar 4 times daily, Disp: 100 each, Rfl: 12 .  pramoxine-hydrocortisone (PROCTOCREAM-HC) 1-1 % rectal cream, Place 1 application rectally 2 (two) times daily., Disp: 30 g, Rfl: 0 .  Prenatal Multivit-Min-Fe-FA (PRENATAL VITAMINS PO), Take by mouth., Disp: , Rfl:  .  metFORMIN (GLUCOPHAGE) 500 MG tablet, Take 1 tablet (500 mg total) by mouth 2 (two) times daily with a meal., Disp: 60 tablet, Rfl: 4 Social History: Reviewed -  reports that she has quit smoking. Her smoking use included cigarettes. She has a 2.50 pack-year smoking history. She has never used smokeless tobacco.   Physical  Assessment:   Vitals:   08/26/18 0901  BP: 134/88  Pulse: (!) 115  Weight: 232 lb (105.2 kg)  Body mass index is 39.82 kg/m.           Physical Examination:   General appearance: alert, well appearing, and in no distress  Mental status: alert, oriented to person, place, and time  Skin: warm & dry   Extremities: Edema: Trace    Cardiovascular: normal heart rate noted  Respiratory: normal respiratory effort, no distress  Abdomen: gravid, soft, non-tender  Pelvic: Cervical exam deferred         Fetal Status:     Movement: Present    Fetal  Surveillance Testing today: doppler  Results for orders placed or performed in visit on 08/26/18 (from the past 24 hour(s))  POC Urinalysis Dipstick OB   Collection Time: 08/26/18  9:10 AM  Result Value Ref Range   Color, UA     Clarity, UA     Glucose, UA Negative Negative   Bilirubin, UA     Ketones, UA neg    Spec Grav, UA     Blood, UA neg    pH, UA     POC,PROTEIN,UA Trace Negative, Trace, Small (1+), Moderate (2+), Large (3+), 4+   Urobilinogen, UA     Nitrite, UA neg    Leukocytes, UA Negative Negative   Appearance     Odor      Assessment & Plan:  1) High-risk pregnancy G3P2002 at 45w3dwith an Estimated Date of Delivery: 11/08/18   2) CHTN, stable.  Treatment Plan:  Per A2DM  3) Gest Diabetes, unstable.  Treatment Plan:  Pt doesn't want to start w/insulin d/t variable schedule.  Will start Metformin 5041mBID  Labs/procedures today:    Follow-up: Return for asap for growth [place] weeks for HROB//BPP.  Future Appointments  Date Time Provider [place]/01/2019 11:30 AM [facility] - [facility] [place] None  09/18/2018  8:30 AM [facility] - [facility] [place] None  09/18/2018  9:30 AM Eure, LuMertie ClauseMD [facility]TO-[facility]    Orders Placed This Encounter  Procedures  . [place] Follow Up  . [place] BPP WO NON STRESS  . [place] Cord Doppler  . POC Urinalysis Dipstick OB   FrChristin FudgeNM 08/26/2018 10:35 AM

## 2018-08-26 NOTE — Patient Instructions (Signed)
[name], I greatly value your feedback.  If you receive a survey following your visit with [place] today, we appreciate you taking the time to fill it out.  Thanks, Nigel Berthold, CNM   Call the office [phone]) or go to [facility] if:  You begin to have strong, frequent contractions  Your water breaks.  Sometimes it is a big gush of fluid, sometimes it is just a trickle that keeps getting your panties wet or running down your legs  You have vaginal bleeding.  It is normal to have a small amount of spotting if your cervix was checked.   You don't feel your baby moving like normal.  If you don't, get you something to eat and drink and lay down and focus on feeling your baby move.  You should feel at least 10 movements in 2 hours.  If you don't, you should call the office or go to [facility].    Tdap Vaccine  It is recommended that you get the Tdap vaccine during the third trimester of EACH pregnancy to help protect your baby from getting pertussis (whooping cough)  27-36 weeks is the BEST time to do this so that you can pass the protection on to your baby. During pregnancy is better than after pregnancy, but if you are unable to get it during pregnancy it will be offered at the hospital.   You will be offered this vaccine in the office after 27 weeks. If you do not have health insurance, you can get this vaccine at the health department or your family doctor  Everyone who will be around your baby should also be up-to-date on their vaccines. Adults (who are not pregnant) only need 1 dose of Tdap during adulthood.   Third Trimester of Pregnancy The third trimester is from week 29 through week 42, months 7 through 9. The third trimester is a time when the fetus is growing rapidly. At the end of the ninth month, the fetus is about 20 inches in length and weighs 6-10 pounds.  BODY CHANGES Your body goes through many changes during pregnancy. The changes vary from woman to  woman.   Your weight will continue to increase. You can expect to gain 25-35 pounds (11-16 kg) by the end of the pregnancy.  You may begin to get stretch marks on your hips, abdomen, and breasts.  You may urinate more often because the fetus is moving lower into your pelvis and pressing on your bladder.  You may develop or continue to have heartburn as a result of your pregnancy.  You may develop constipation because certain hormones are causing the muscles that push waste through your intestines to slow down.  You may develop hemorrhoids or swollen, bulging veins (varicose veins).  You may have pelvic pain because of the weight gain and pregnancy hormones relaxing your joints between the bones in your pelvis. Backaches may result from overexertion of the muscles supporting your posture.  You may have changes in your hair. These can include thickening of your hair, rapid growth, and changes in texture. Some women also have hair loss during or after pregnancy, or hair that feels dry or thin. Your hair will most likely return to normal after your baby is born.  Your breasts will continue to grow and be tender. A yellow discharge may leak from your breasts called colostrum.  Your belly button may stick out.  You may feel short of breath because of your expanding uterus.  You may notice the fetus "dropping," or moving lower in your abdomen.  You may have a bloody mucus discharge. This usually occurs a few days to a week before labor begins.  Your cervix becomes thin and soft (effaced) near your due date. WHAT TO EXPECT AT YOUR PRENATAL EXAMS  You will have prenatal exams every 2 weeks until week 36. Then, you will have weekly prenatal exams. During a routine prenatal visit:  You will be weighed to make sure you and the fetus are growing normally.  Your blood pressure is taken.  Your abdomen will be measured to track your baby's growth.  The fetal heartbeat will be listened  to.  Any test results from the previous visit will be discussed.  You may have a cervical check near your due date to see if you have effaced. At around 36 weeks, your caregiver will check your cervix. At the same time, your caregiver will also perform a test on the secretions of the vaginal tissue. This test is to determine if a type of bacteria, Group B streptococcus, is present. Your caregiver will explain this further. Your caregiver may ask you:  What your birth plan is.  How you are feeling.  If you are feeling the baby move.  If you have had any abnormal symptoms, such as leaking fluid, bleeding, severe headaches, or abdominal cramping.  If you have any questions. Other tests or screenings that may be performed during your third trimester include:  Blood tests that check for low iron levels (anemia).  Fetal testing to check the health, activity level, and growth of the fetus. Testing is done if you have certain medical conditions or if there are problems during the pregnancy. FALSE LABOR You may feel small, irregular contractions that eventually go away. These are called Braxton Hicks contractions, or false labor. Contractions may last for hours, days, or even weeks before true labor sets in. If contractions come at regular intervals, intensify, or become painful, it is best to be seen by your caregiver.  SIGNS OF LABOR   Menstrual-like cramps.  Contractions that are 5 minutes apart or less.  Contractions that start on the top of the uterus and spread down to the lower abdomen and back.  A sense of increased pelvic pressure or back pain.  A watery or bloody mucus discharge that comes from the vagina. If you have any of these signs before the 37th week of pregnancy, call your caregiver right away. You need to go to the hospital to get checked immediately. HOME CARE INSTRUCTIONS   Avoid all smoking, herbs, alcohol, and unprescribed drugs. These chemicals affect the  formation and growth of the baby.  Follow your caregiver's instructions regarding medicine use. There are medicines that are either safe or unsafe to take during pregnancy.  Exercise only as directed by your caregiver. Experiencing uterine cramps is a good sign to stop exercising.  Continue to eat regular, healthy meals.  Wear a good support bra for breast tenderness.  Do not use hot tubs, steam rooms, or saunas.  Wear your seat belt at all times when driving.  Avoid raw meat, uncooked cheese, cat litter boxes, and soil used by cats. These carry germs that can cause birth defects in the baby.  Take your prenatal vitamins.  Try taking a stool softener (if your caregiver approves) if you develop constipation. Eat more high-fiber foods, such as fresh vegetables or fruit and whole grains. Drink plenty of fluids to keep your urine  clear or pale yellow.  Take warm sitz baths to soothe any pain or discomfort caused by hemorrhoids. Use hemorrhoid cream if your caregiver approves.  If you develop varicose veins, wear support hose. Elevate your feet for 15 minutes, 3-4 times a day. Limit salt in your diet.  Avoid heavy lifting, wear low heal shoes, and practice good posture.  Rest a lot with your legs elevated if you have leg cramps or low back pain.  Visit your dentist if you have not gone during your pregnancy. Use a soft toothbrush to brush your teeth and be gentle when you floss.  A sexual relationship may be continued unless your caregiver directs you otherwise.  Do not travel far distances unless it is absolutely necessary and only with the approval of your caregiver.  Take prenatal classes to understand, practice, and ask questions about the labor and delivery.  Make a trial run to the hospital.  Pack your hospital bag.  Prepare the baby's nursery.  Continue to go to all your prenatal visits as directed by your caregiver. SEEK MEDICAL CARE IF:  You are unsure if you are in  labor or if your water has broken.  You have dizziness.  You have mild pelvic cramps, pelvic pressure, or nagging pain in your abdominal area.  You have persistent nausea, vomiting, or diarrhea.  You have a bad smelling vaginal discharge.  You have pain with urination. SEEK IMMEDIATE MEDICAL CARE IF:   You have a fever.  You are leaking fluid from your vagina.  You have spotting or bleeding from your vagina.  You have severe abdominal cramping or pain.  You have rapid weight loss or gain.  You have shortness of breath with chest pain.  You notice sudden or extreme swelling of your face, hands, ankles, feet, or legs.  You have not felt your baby move in over an hour.  You have severe headaches that do not go away with medicine.  You have vision changes. Document Released: 07/02/2001 Document Revised: 07/13/2013 Document Reviewed: 09/08/2012 [facility] Patient Information 2015 [place], [place]. This information is not intended to replace advice given to you by your health care provider. Make sure you discuss any questions you have with your health care provider.

## 2018-08-28 ENCOUNTER — Ambulatory Visit (INDEPENDENT_AMBULATORY_CARE_PROVIDER_SITE_OTHER): Payer: Commercial Managed Care - PPO

## 2018-08-28 DIAGNOSIS — O99210 Obesity complicating pregnancy, unspecified trimester: Secondary | ICD-10-CM

## 2018-08-28 DIAGNOSIS — O0993 Supervision of high risk pregnancy, unspecified, third trimester: Secondary | ICD-10-CM | POA: Diagnosis not present

## 2018-08-28 DIAGNOSIS — O10919 Unspecified pre-existing hypertension complicating pregnancy, unspecified trimester: Secondary | ICD-10-CM

## 2018-08-28 NOTE — Progress Notes (Signed)
[place] 40+3 wks,cephalic,cx 3 cm,anterior placenta gr 0,afi 11 cm,normal ovaries bilat,fhr 159 bpm,efw 1915 g 98%

## 2018-09-18 ENCOUNTER — Ambulatory Visit (INDEPENDENT_AMBULATORY_CARE_PROVIDER_SITE_OTHER): Payer: Commercial Managed Care - PPO

## 2018-09-18 ENCOUNTER — Encounter: Payer: Self-pay | Admitting: Obstetrics & Gynecology

## 2018-09-18 ENCOUNTER — Ambulatory Visit (INDEPENDENT_AMBULATORY_CARE_PROVIDER_SITE_OTHER): Payer: Commercial Managed Care - PPO | Admitting: Obstetrics & Gynecology

## 2018-09-18 VITALS — BP 137/94 | HR 115 | Wt 242.0 lb

## 2018-09-18 DIAGNOSIS — O0993 Supervision of high risk pregnancy, unspecified, third trimester: Secondary | ICD-10-CM

## 2018-09-18 DIAGNOSIS — O99210 Obesity complicating pregnancy, unspecified trimester: Secondary | ICD-10-CM

## 2018-09-18 DIAGNOSIS — O24414 Gestational diabetes mellitus in pregnancy, insulin controlled: Secondary | ICD-10-CM

## 2018-09-18 DIAGNOSIS — Z331 Pregnant state, incidental: Secondary | ICD-10-CM

## 2018-09-18 DIAGNOSIS — O10919 Unspecified pre-existing hypertension complicating pregnancy, unspecified trimester: Secondary | ICD-10-CM | POA: Diagnosis not present

## 2018-09-18 DIAGNOSIS — Z1389 Encounter for screening for other disorder: Secondary | ICD-10-CM

## 2018-09-18 DIAGNOSIS — Z3A32 32 weeks gestation of pregnancy: Secondary | ICD-10-CM

## 2018-09-18 DIAGNOSIS — O24419 Gestational diabetes mellitus in pregnancy, unspecified control: Secondary | ICD-10-CM

## 2018-09-18 DIAGNOSIS — O10913 Unspecified pre-existing hypertension complicating pregnancy, third trimester: Secondary | ICD-10-CM

## 2018-09-18 LAB — POCT URINALYSIS DIPSTICK OB
Blood, UA: NEGATIVE
LEUKOCYTES UA: NEGATIVE
Nitrite, UA: NEGATIVE
POC,PROTEIN,UA: NEGATIVE

## 2018-09-18 MED ORDER — INSULIN GLARGINE 100 UNIT/ML SOLOSTAR PEN: 20.0000 [IU] | PEN_INJECTOR | Freq: Every day | SUBCUTANEOUS | 11 refills | Status: DC

## 2018-09-18 MED ORDER — METFORMIN HCL 1000 MG PO TABS: 1000.0000 mg | ORAL_TABLET | Freq: Two times a day (BID) | ORAL | 11 refills | Status: DC

## 2018-09-18 NOTE — Progress Notes (Signed)
[place] 00+7 wks,cephalic,BPP 6/2,UQJFHLKT placenta gr 1,afi 11 cm,RI .66,.62,.56,.63=52%,FHR 148 bpm,normal left ovary,two simple right ovarian cysts (#1) 3.2 x 2 x 2.3 cm,(#2) 2.4 x 2.5 x 2.3 cm,EFW 2752 g 99%

## 2018-09-18 NOTE — Progress Notes (Signed)
HIGH-RISK PREGNANCY VISIT Patient name: Marissa Lane MRN [id number]  Date of birth: 1991/06/11 Chief Complaint:   High Risk Gestation (u/s today)  History of Present Illness:   Marissa Lane is a 28 y.o. G31P2002 female at [redacted]w[redacted]d with an Estimated Date of Delivery: 11/08/18 being seen today for ongoing management of a high-risk pregnancy complicated by chronic HTN, class  A2 DM.  Today she reports no complaints. Contractions: Not present.  .  Movement: Present. denies leaking of fluid.  Review of Systems:   Pertinent items are noted in HPI Denies abnormal vaginal discharge w/ itching/odor/irritation, headaches, visual changes, shortness of breath, chest pain, abdominal pain, severe nausea/vomiting, or problems with urination or bowel movements unless otherwise stated above. Pertinent History Reviewed:  Reviewed past medical,surgical, social, obstetrical and family history.  Reviewed problem list, medications and allergies. Physical Assessment:   Vitals:   09/18/18 0946  BP: (!) 137/94  Pulse: (!) 115  Weight: 242 lb (109.8 kg)  Body mass index is 41.54 kg/m.           Physical Examination:   General appearance: alert, well appearing, and in no distress  Mental status: alert, oriented to person, place, and time  Skin: warm & dry   Extremities: Edema: Trace    Cardiovascular: normal heart rate noted  Respiratory: normal respiratory effort, no distress  Abdomen: gravid, soft, non-tender  Pelvic: Cervical exam deferred         Fetal Status:     Movement: Present    Fetal Surveillance Testing today: BPP 8/8   Results for orders placed or performed in visit on 09/18/18 (from the past 24 hour(s))  POC Urinalysis Dipstick OB   Collection Time: 09/18/18  9:45 AM  Result Value Ref Range   Color, UA     Clarity, UA     Glucose, UA Large (3+) (A) Negative   Bilirubin, UA     Ketones, UA small    Spec Grav, UA     Blood, UA neg    pH, UA     POC,PROTEIN,UA Negative Negative,  Trace, Small (1+), Moderate (2+), Large (3+), 4+   Urobilinogen, UA     Nitrite, UA neg    Leukocytes, UA Negative Negative   Appearance     Odor      Assessment & Plan:  1) High-risk pregnancy G3P2002 at [redacted]w[redacted]d with an Estimated Date of Delivery: 11/08/18   2) Class A2 DM, unstable, CBG are high the whole curve is high, pt already increased to 1000 mg BID of metformin, will add 20 units lantus at 2200 each night, EFW 99%, repeat 36 weeks  3) CHTN, stable, no meds at this point  Meds:  Meds ordered this encounter  Medications  . metFORMIN (GLUCOPHAGE) 1000 MG tablet    Sig: Take 1 tablet (1,000 mg total) by mouth 2 (two) times daily with a meal.    Dispense:  60 tablet    Refill:  11  . Insulin Glargine (LANTUS) 100 UNIT/ML Solostar Pen    Sig: Inject 20 Units into the skin at bedtime.    Dispense:  15 mL    Refill:  11    Labs/procedures today:   Treatment Plan:  Add lantus 20 units, weekly BPP, twice weekly if needs meds for HTN, repeat EFW 36 weeks  Reviewed: Preterm labor symptoms and general obstetric precautions including but not limited to vaginal bleeding, contractions, leaking of fluid and fetal movement were reviewed in detail  with the patient.  All questions were answered.  Follow-up: Return in about 1 week (around 09/25/2018) for BPP/sono, HROB.  Orders Placed This Encounter  Procedures  . POC Urinalysis Dipstick OB   Florian Buff  09/18/2018 10:28 AM

## 2018-09-28 ENCOUNTER — Ambulatory Visit (INDEPENDENT_AMBULATORY_CARE_PROVIDER_SITE_OTHER): Payer: Commercial Managed Care - PPO | Admitting: Obstetrics & Gynecology

## 2018-09-28 ENCOUNTER — Ambulatory Visit (INDEPENDENT_AMBULATORY_CARE_PROVIDER_SITE_OTHER): Payer: Commercial Managed Care - PPO

## 2018-09-28 ENCOUNTER — Other Ambulatory Visit: Payer: Self-pay

## 2018-09-28 ENCOUNTER — Encounter: Payer: Self-pay | Admitting: Advanced Practice Midwife

## 2018-09-28 VITALS — BP 135/86 | HR 122 | Wt 244.0 lb

## 2018-09-28 DIAGNOSIS — O10913 Unspecified pre-existing hypertension complicating pregnancy, third trimester: Secondary | ICD-10-CM | POA: Diagnosis not present

## 2018-09-28 DIAGNOSIS — O10919 Unspecified pre-existing hypertension complicating pregnancy, unspecified trimester: Secondary | ICD-10-CM

## 2018-09-28 DIAGNOSIS — Z3A34 34 weeks gestation of pregnancy: Secondary | ICD-10-CM | POA: Diagnosis not present

## 2018-09-28 DIAGNOSIS — Z1389 Encounter for screening for other disorder: Secondary | ICD-10-CM

## 2018-09-28 DIAGNOSIS — O0993 Supervision of high risk pregnancy, unspecified, third trimester: Secondary | ICD-10-CM

## 2018-09-28 DIAGNOSIS — O24414 Gestational diabetes mellitus in pregnancy, insulin controlled: Secondary | ICD-10-CM

## 2018-09-28 DIAGNOSIS — O99210 Obesity complicating pregnancy, unspecified trimester: Secondary | ICD-10-CM

## 2018-09-28 DIAGNOSIS — Z331 Pregnant state, incidental: Secondary | ICD-10-CM

## 2018-09-28 DIAGNOSIS — O24419 Gestational diabetes mellitus in pregnancy, unspecified control: Secondary | ICD-10-CM

## 2018-09-28 LAB — POCT URINALYSIS DIPSTICK OB
Blood, UA: NEGATIVE
Glucose, UA: NEGATIVE
Leukocytes, UA: NEGATIVE
Nitrite, UA: NEGATIVE
PROTEIN: NEGATIVE

## 2018-09-28 MED ORDER — GLYBURIDE 5 MG PO TABS: 5.0000 mg | ORAL_TABLET | Freq: Every day | ORAL | 11 refills | Status: DC

## 2018-09-28 NOTE — Progress Notes (Signed)
[place] 41+3 wks,cephalic,fhr 643 bpm,BPP 8/3,JRPZPSUG placenta gr 2,afi 14 cm,RI .55,.62,.57,.55=44%,two simple right ovarian cysts n/c

## 2018-09-28 NOTE — Progress Notes (Signed)
HIGH-RISK PREGNANCY VISIT Patient name: Marissa Lane MRN [id number]  Date of birth: July 08, 1991 Chief Complaint:   High Risk Gestation (u/s today)  History of Present Illness:   Marissa Lane is a 28 y.o. G73P2002 female at [redacted]w[redacted]d with an Estimated Date of Delivery: 11/08/18 being seen today for ongoing management of a high-risk pregnancy complicated by chronic HTN, class  A2 DM.  Today she reports no complaints. Contractions: Irritability. Vag. Bleeding: None.  Movement: Present. denies leaking of fluid.  Review of Systems:   Pertinent items are noted in HPI Denies abnormal vaginal discharge w/ itching/odor/irritation, headaches, visual changes, shortness of breath, chest pain, abdominal pain, severe nausea/vomiting, or problems with urination or bowel movements unless otherwise stated above. Pertinent History Reviewed:  Reviewed past medical,surgical, social, obstetrical and family history.  Reviewed problem list, medications and allergies. Physical Assessment:   Vitals:   09/28/18 1513  BP: 135/86  Pulse: (!) 122  Weight: 244 lb (110.7 kg)  Body mass index is 41.88 kg/m.           Physical Examination:   General appearance: alert, well appearing, and in no distress  Mental status: alert, oriented to person, place, and time  Skin: warm & dry   Extremities: Edema: Trace    Cardiovascular: normal heart rate noted  Respiratory: normal respiratory effort, no distress  Abdomen: gravid, soft, non-tender  Pelvic: Cervical exam deferred         Fetal Status:     Movement: Present    Fetal Surveillance Testing today: BPP 8/8   Results for orders placed or performed in visit on 09/28/18 (from the past 24 hour(s))  POC Urinalysis Dipstick OB   Collection Time: 09/28/18  3:16 PM  Result Value Ref Range   Color, UA     Clarity, UA     Glucose, UA Negative Negative   Bilirubin, UA     Ketones, UA large    Spec Grav, UA     Blood, UA neg    pH, UA     POC,PROTEIN,UA Negative  Negative, Trace, Small (1+), Moderate (2+), Large (3+), 4+   Urobilinogen, UA     Nitrite, UA neg    Leukocytes, UA Negative Negative   Appearance     Odor      Assessment & Plan:  1) High-risk pregnancy G3P2002 at [redacted]w[redacted]d with an Estimated Date of Delivery: 11/08/18   2) Class A2 DM, stable, on lantus x 10 days with still sub optimal CBG control in am fastings.  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  Add glyburide to 5 mg at 1800 nightly, continue lantus at 2200 nightly  3) CHTN, stable, no meds BP stull not requiring BP meds  Meds:  Meds ordered this encounter  Medications  . glyBURIDE (DIABETA) 5 MG tablet    Sig: Take 1 tablet (5 mg total) by mouth daily with supper.    Dispense:  30 tablet    Refill:  11    Labs/procedures today: BPP 8/8 with normal Dopplers 44%  Treatment Plan:  Continue  weekly fetal testing testing unless meds needed for hypertension  Reviewed: Preterm labor symptoms and general obstetric precautions including but not limited to vaginal bleeding, contractions, leaking of fluid and fetal movement were reviewed in detail with the patient.  All questions were answered.  Follow-up: Return in about 1 week (around 10/05/2018) for BPP/sono, HROB.  Orders Placed This Encounter  Procedures  .  POC Urinalysis Dipstick OB   Florian Buff 09/28/2018 3:41 PM

## 2018-10-07 ENCOUNTER — Encounter: Payer: Self-pay | Admitting: Advanced Practice Midwife

## 2018-10-07 ENCOUNTER — Other Ambulatory Visit: Payer: Self-pay

## 2018-10-07 ENCOUNTER — Ambulatory Visit (INDEPENDENT_AMBULATORY_CARE_PROVIDER_SITE_OTHER): Payer: Commercial Managed Care - PPO

## 2018-10-07 ENCOUNTER — Ambulatory Visit (INDEPENDENT_AMBULATORY_CARE_PROVIDER_SITE_OTHER): Payer: Commercial Managed Care - PPO | Admitting: Advanced Practice Midwife

## 2018-10-07 VITALS — BP 121/80 | HR 105 | Wt 245.0 lb

## 2018-10-07 DIAGNOSIS — Z3A35 35 weeks gestation of pregnancy: Secondary | ICD-10-CM

## 2018-10-07 DIAGNOSIS — Z1389 Encounter for screening for other disorder: Secondary | ICD-10-CM

## 2018-10-07 DIAGNOSIS — O10913 Unspecified pre-existing hypertension complicating pregnancy, third trimester: Secondary | ICD-10-CM

## 2018-10-07 DIAGNOSIS — O99210 Obesity complicating pregnancy, unspecified trimester: Secondary | ICD-10-CM

## 2018-10-07 DIAGNOSIS — O10919 Unspecified pre-existing hypertension complicating pregnancy, unspecified trimester: Secondary | ICD-10-CM

## 2018-10-07 DIAGNOSIS — O24419 Gestational diabetes mellitus in pregnancy, unspecified control: Secondary | ICD-10-CM

## 2018-10-07 DIAGNOSIS — O0993 Supervision of high risk pregnancy, unspecified, third trimester: Secondary | ICD-10-CM

## 2018-10-07 DIAGNOSIS — O0990 Supervision of high risk pregnancy, unspecified, unspecified trimester: Secondary | ICD-10-CM

## 2018-10-07 DIAGNOSIS — Z331 Pregnant state, incidental: Secondary | ICD-10-CM

## 2018-10-07 LAB — POCT URINALYSIS DIPSTICK OB
Blood, UA: NEGATIVE
GLUCOSE, UA: NEGATIVE
Leukocytes, UA: NEGATIVE
Nitrite, UA: NEGATIVE
POC,PROTEIN,UA: NEGATIVE

## 2018-10-07 NOTE — Progress Notes (Addendum)
HIGH-RISK PREGNANCY VISIT Patient name: Marissa Lane MRN [id number]  Date of birth: 11-02-1990 Chief Complaint:   High Risk Gestation ([place] today; + cramping )  History of Present Illness:   Marissa Lane is a 28 y.o. G58P2002 female at 58w3dwith an Estimated Date of Delivery: 11/08/18 being seen today for ongoing management of a high-risk pregnancy complicated by chronic HTN, gestational DM, class  A2 DM. Glyburide '5mg'$  was added to her Lantus/metformin last week.Overall improvement in FBS, still occ ^ pp (140) D/t eating cake (kid's birthdays).Today she reports no complaints. Contractions: Not present. Vag. Bleeding: None.  Movement: Present. denies leaking of fluid.  Review of Systems:   Pertinent items are noted in HPI Denies abnormal vaginal discharge w/ itching/odor/irritation, headaches, visual changes, shortness of breath, chest pain, abdominal pain, severe nausea/vomiting, or problems with urination or bowel movements unless otherwise stated above.    Pertinent History Reviewed:  Medical & Surgical Hx:   Past Medical History:  Diagnosis Date  . Acne   . Chest pain   . Chronic hypertension   . Diabetes mellitus without complication [place] [place]   . Dysthymic disorder    over dose age 28 . GERD (gastroesophageal reflux disease)   . Gestational diabetes    metformin  . H/O urinary frequency 06/25/2013  . Irregular intermenstrual bleeding 10/12/2013  . Obesity   . Palpitations   . PCOS (polycystic ovarian syndrome)   . Postpartum depression   . Tobacco abuse    Past Surgical History:  Procedure Laterality Date  . CHOLECYSTECTOMY  03/06/2012   Procedure: LAPAROSCOPIC CHOLECYSTECTOMY;  Surgeon: BDonato Heinz MD;  Location: [facility];  Service: General;  Laterality: N/A;  . ESOPHAGOGASTRODUODENOSCOPY N/A 09/09/2012   Procedure: ESOPHAGOGASTRODUODENOSCOPY (EGD);  Surgeon: NRogene Houston MD;  Location: [facility] SUITE;  Service: Endoscopy;  Laterality: N/A;  340  . MOUTH SURGERY    . SKIN  GRAFT  2009   gum graft  . TONSILLECTOMY     Family History  Adopted: Yes  Problem Relation Age of Onset  . Congestive Heart Failure Mother   . Irritable bowel syndrome Mother   . Ulcers Mother   . Anemia Mother   . Hypertension Brother   . Colon cancer Maternal Grandmother 44 . Heart disease Maternal Grandfather 40       MI    Current Outpatient Medications:  .  ACCU-CHEK FASTCLIX LANCETS MISC, 1 each by Percutaneous route 4 (four) times daily., Disp: 100 each, Rfl: 12 .  aspirin EC 81 MG tablet, Take 162 mg by mouth daily., Disp: , Rfl:  .  Blood Glucose Monitoring Suppl (ACCU-CHEK GUIDE) w/Device KIT, 1 each by Does not apply route 4 (four) times daily., Disp: 1 kit, Rfl: 0 .  Esomeprazole Magnesium (NEXIUM PO), Take by mouth daily., Disp: , Rfl:  .  glucose blood (ACCU-CHEK GUIDE) test strip, Use as instructed to check blood sugar 4 times daily, Disp: 100 each, Rfl: 12 .  glyBURIDE (DIABETA) 5 MG tablet, Take 1 tablet (5 mg total) by mouth daily with supper., Disp: 30 tablet, Rfl: 11 .  hydrocortisone-pramoxine (ANALPRAM-HC) 2.5-1 % rectal cream, as needed. , Disp: , Rfl:  .  Insulin Glargine (LANTUS) 100 UNIT/ML Solostar Pen, Inject 20 Units into the skin at bedtime., Disp: 15 mL, Rfl: 11 .  metFORMIN (GLUCOPHAGE) 1000 MG tablet, Take 1 tablet (1,000 mg total) by mouth 2 (two) times daily with a meal., Disp: 60 tablet, Rfl: 11 .  Prenatal Multivit-Min-Fe-FA (PRENATAL VITAMINS PO), Take by mouth., Disp: , Rfl:  Social History: Reviewed -  reports that she has quit smoking. Her smoking use included cigarettes. She has a 2.50 pack-year smoking history. She has never used smokeless tobacco.   Physical Assessment:   Vitals:   10/07/18 0957  BP: 121/80  Pulse: (!) 105  Weight: 245 lb (111.1 kg)  Body mass index is 42.05 kg/m.              Physical Examination:   General appearance: alert, well appearing, and in no distress  Mental status: alert, oriented to person, place,  and time  Skin: warm & dry   Extremities: Edema: Trace    Cardiovascular: normal heart rate noted  Respiratory: normal respiratory effort, no distress  Abdomen: gravid, soft, non-tender  Pelvic: Cervical exam deferred         Fetal Status: Fetal Heart Rate (bpm): 132   Movement: Present    Fetal Surveillance Testing today: [place] 54+2 wks,cephalic,BPP 7/0,WCB 762 bpm,anterior placenta gr 3,afi 3.8 cm,RI .62,.63,.56,.55=57% stableAssessment & Plan:  1) High-risk pregnancy G3P2002 at 22w3dwith an Estimated Date of Delivery: 11/08/18   2) CHTN, no med, stable.  Treatment Plan:  Weekly BPP unless on meds, then add a NST (twice weekly testing)  3) A2DM, stable.  [facility]  [facility]  Labs/procedures today: BPP   Follow-up: Return for weekly BPP/HROB (add growth [place] next week's [place]).  No future appointments.  Orders Placed This Encounter  Procedures  . [place] Follow Up  . POC Urinalysis Dipstick OB   FChristin FudgeCNM 10/07/2018 10:04 AM

## 2018-10-07 NOTE — Progress Notes (Signed)
[place] 75+1 wks,cephalic,BPP 0/2,HEN 277 bpm,anterior placenta gr 3,afi 3.8 cm,RI .62,.63,.56,.55=57%

## 2018-10-07 NOTE — Patient Instructions (Signed)
Coronavirus (COVID-19) Are you at risk?  Are you at risk for the Coronavirus (COVID-19)?  To be considered HIGH RISK for Coronavirus (COVID-19), you have to meet the following criteria:  . Traveled to [place], [place], [place], [place] or [place]; or in the [place] to [place], [place], [place], or [place]; and have fever, cough, and shortness of breath within the last 2 weeks of travel OR . Been in close contact with a person diagnosed with COVID-19 within the last 2 weeks and have fever, cough, and shortness of breath . IF YOU DO NOT MEET THESE CRITERIA, YOU ARE CONSIDERED LOW RISK FOR COVID-19.  What to do if you are HIGH RISK for COVID-19?  . If you are having a medical emergency, call 911. . Seek medical care right away. Before you go to a doctor's office, urgent care or emergency department, call ahead and tell them about your recent travel, contact with someone diagnosed with COVID-19, and your symptoms. You should receive instructions from your physician's office regarding next steps of care.  . When you arrive at healthcare provider, tell the healthcare staff immediately you have returned from visiting [place], [place], [place], [place] or [place]; or traveled in the [place] to [place], [place], [place], or [place]; in the last two weeks or you have been in close contact with a person diagnosed with COVID-19 in the last 2 weeks.   . Tell the health care staff about your symptoms: fever, cough and shortness of breath. . After you have been seen by a medical provider, you will be either: o Tested for (COVID-19) and discharged home on quarantine except to seek medical care if symptoms worsen, and asked to  - Stay home and avoid contact with others until you get your results (4-5 days)  - Avoid travel on public transportation if possible (such as bus, train, or airplane) or o Sent to the Emergency Department by EMS for evaluation, COVID-19 testing, and possible  admission depending on your condition and test results.  What to do if you are LOW RISK for COVID-19?  Reduce your risk of any infection by using the same precautions used for avoiding the common cold or flu:  . Wash your hands often with soap and warm water for at least 20 seconds.  If soap and water are not readily available, use an alcohol-based hand sanitizer with at least 60% alcohol.  . If coughing or sneezing, cover your mouth and nose by coughing or sneezing into the elbow areas of your shirt or coat, into a tissue or into your sleeve (not your hands). . Avoid shaking hands with others and consider head nods or verbal greetings only. . Avoid touching your eyes, nose, or mouth with unwashed hands.  . Avoid close contact with people who are sick. . Avoid places or events with large numbers of people in one location, like concerts or sporting events. . Carefully consider travel plans you have or are making. . If you are planning any travel outside or inside the [place], visit the CDC's Travelers' Health webpage for the latest health notices. . If you have some symptoms but not all symptoms, continue to monitor at home and seek medical attention if your symptoms worsen. . If you are having a medical emergency, call 911.   ADDITIONAL HEALTHCARE OPTIONS FOR PATIENTS  [facility] / e-Visit: [email or web address]         [facility]: [phone]  [facility]: [phone]                   [facility]: [phone]   

## 2018-10-14 ENCOUNTER — Ambulatory Visit (INDEPENDENT_AMBULATORY_CARE_PROVIDER_SITE_OTHER): Payer: Commercial Managed Care - PPO | Admitting: Obstetrics and Gynecology

## 2018-10-14 ENCOUNTER — Other Ambulatory Visit: Payer: Self-pay

## 2018-10-14 ENCOUNTER — Ambulatory Visit (INDEPENDENT_AMBULATORY_CARE_PROVIDER_SITE_OTHER): Payer: Commercial Managed Care - PPO

## 2018-10-14 ENCOUNTER — Encounter: Payer: Self-pay | Admitting: Obstetrics and Gynecology

## 2018-10-14 VITALS — BP 133/89 | HR 115 | Wt 245.4 lb

## 2018-10-14 DIAGNOSIS — O0993 Supervision of high risk pregnancy, unspecified, third trimester: Secondary | ICD-10-CM

## 2018-10-14 DIAGNOSIS — Z23 Encounter for immunization: Secondary | ICD-10-CM | POA: Diagnosis not present

## 2018-10-14 DIAGNOSIS — Z1389 Encounter for screening for other disorder: Secondary | ICD-10-CM

## 2018-10-14 DIAGNOSIS — O10919 Unspecified pre-existing hypertension complicating pregnancy, unspecified trimester: Secondary | ICD-10-CM

## 2018-10-14 DIAGNOSIS — O24419 Gestational diabetes mellitus in pregnancy, unspecified control: Secondary | ICD-10-CM | POA: Diagnosis not present

## 2018-10-14 DIAGNOSIS — O0990 Supervision of high risk pregnancy, unspecified, unspecified trimester: Secondary | ICD-10-CM

## 2018-10-14 DIAGNOSIS — O99210 Obesity complicating pregnancy, unspecified trimester: Secondary | ICD-10-CM

## 2018-10-14 DIAGNOSIS — Z3A36 36 weeks gestation of pregnancy: Secondary | ICD-10-CM

## 2018-10-14 DIAGNOSIS — O24414 Gestational diabetes mellitus in pregnancy, insulin controlled: Secondary | ICD-10-CM

## 2018-10-14 DIAGNOSIS — O10913 Unspecified pre-existing hypertension complicating pregnancy, third trimester: Secondary | ICD-10-CM | POA: Diagnosis not present

## 2018-10-14 DIAGNOSIS — Z331 Pregnant state, incidental: Secondary | ICD-10-CM

## 2018-10-14 LAB — POCT URINALYSIS DIPSTICK OB
Blood, UA: NEGATIVE
GLUCOSE, UA: NEGATIVE
Ketones, UA: NEGATIVE
Leukocytes, UA: NEGATIVE
Nitrite, UA: NEGATIVE

## 2018-10-14 NOTE — Progress Notes (Addendum)
Patient ID: Marissa Lane, female   DOB: 12-31-1990, 28 y.o.   MRN: [id number]    [facility] PREGNANCY VISIT Patient name: Marissa Lane MRN [id number]  Date of birth: 1991/03/24 Chief Complaint:   High Risk Gestation (u/s today; gbs/gc)  History of Present Illness:   Marissa Lane is a 28 y.o. G3P2002 female at [redacted]w[redacted]d with an Estimated Date of Delivery: 11/08/18 being seen today for ongoing management of a high-risk pregnancy complicated by chronic HTN, class  A2 DM.  FBS have been normal with a few high readings due getting regular soft drinks instead of diets. 1st baby 6# 9 oz. 2nd 7#3oz with gestational DM. Today she reports backache. Contractions: Irregular.  .  Movement: Present. denies leaking of fluid.  Review of Systems:   Pertinent items are noted in HPI Denies abnormal vaginal discharge w/ itching/odor/irritation, headaches, visual changes, shortness of breath, chest pain, abdominal pain, severe nausea/vomiting, or problems with urination or bowel movements unless otherwise stated above. Pertinent History Reviewed:  Reviewed past medical,surgical, social, obstetrical and family history.  Reviewed problem list, medications and allergies. Physical Assessment:   Vitals:   10/14/18 1541  BP: 133/89  Pulse: (!) 115  Weight: 245 lb 6.4 oz (111.3 kg)  Body mass index is 42.12 kg/m.           Physical Examination:   General appearance: alert, well appearing, and in no distress  Mental status: normal mood, behavior, speech, dress, motor activity, and thought processes, affect appropriate to mood  Skin: warm & dry   Extremities: Edema: Trace    Cardiovascular: normal heart rate noted  Respiratory: normal respiratory effort, no distress  Abdomen: gravid, soft, non-tender  Pelvic: Cervical exam performed 1 cm long -2, vertex        Fetal Status: Fetal Heart Rate (bpm): 161 [place] Fundal Height: 39 cm Movement: Present    Fetal Surveillance Testing today: [place] 37+1 wks,cephalic,anterior  placenta gr 3,afi 9 cm,fhr 161 bpm,efw 3875 g 99%,AC 99%,RI .56,.54,.57=44%,BPP 8/8  Results for orders placed or performed in visit on 10/14/18 (from the past 24 hour(s))  POC Urinalysis Dipstick OB   Collection Time: 10/14/18  3:44 PM  Result Value Ref Range   Color, UA     Clarity, UA     Glucose, UA Negative Negative   Bilirubin, UA     Ketones, UA neg    Spec Grav, UA     Blood, UA neg    pH, UA     POC,PROTEIN,UA Trace Negative, Trace, Small (1+), Moderate (2+), Large (3+), 4+   Urobilinogen, UA     Nitrite, UA neg    Leukocytes, UA Negative Negative   Appearance     Odor      Assessment & Plan:  1) High-risk pregnancy G3P2002 at [redacted]w[redacted]d with an Estimated Date of Delivery: 11/08/18   2) CHTN, stable 2 81 mg, asa  Until 37 wk  3) Class A2 DM, stable Metformin500 mg BID Lantus HS    Meds: No orders of the defined types were placed in this encounter.   Labs/procedures today: u/s:BPP, t-dap, GCCHL  Treatment Plan:  F/u 1 week BPP and BP check  Follow-up: Return in about 1 week (around 10/21/2018).  Orders Placed This Encounter  Procedures  . GC/Chlamydia Probe Amp  . Culture, beta strep (group b only)  . Tdap vaccine greater than or equal to 7yo IM  . POC Urinalysis Dipstick OB   By signing my name below,  I, Samul Dada, attest that this documentation has been prepared under the direction and in the presence of Jonnie Kind, MD. Electronically Signed: [place]. 10/14/18. 4:50 PM.  I personally performed the services described in this documentation, which was SCRIBED in my presence. The recorded information has been reviewed and considered accurate. It has been edited as necessary during review. Jonnie Kind, MD

## 2018-10-14 NOTE — Progress Notes (Signed)
[place] 98+0 wks,cephalic,anterior placenta gr 3,afi 9 cm,fhr 161 bpm,efw 3875 g 99%,AC 99%,RI .56,.54,.57=44%,BPP 8/8

## 2018-10-15 ENCOUNTER — Other Ambulatory Visit: Payer: Self-pay | Admitting: Obstetrics and Gynecology

## 2018-10-15 LAB — OB RESULTS CONSOLE GBS: GBS: POSITIVE

## 2018-10-17 LAB — GC/CHLAMYDIA PROBE AMP
Chlamydia trachomatis, NAA: NEGATIVE
Neisseria Gonorrhoeae by PCR: NEGATIVE

## 2018-10-19 LAB — CULTURE, BETA STREP (GROUP B ONLY): Strep Gp B Culture: POSITIVE — AB

## 2018-10-20 ENCOUNTER — Telehealth: Payer: Self-pay | Admitting: *Deleted

## 2018-10-20 NOTE — Telephone Encounter (Signed)
Patient informed that we are not allowing visitors or children to come to appointments at this time. Patient denies any contact with anyone suspected or confirmed of having COVID-19. Pt denies fever, cough, sob, muscle pain, diarrhea, rash, vomiting, abdominal pain, red eye, weakness, bruising or bleeding, joint pain or severe headache.  

## 2018-10-21 ENCOUNTER — Ambulatory Visit (INDEPENDENT_AMBULATORY_CARE_PROVIDER_SITE_OTHER): Payer: Commercial Managed Care - PPO

## 2018-10-21 ENCOUNTER — Encounter: Payer: Self-pay | Admitting: Women's Health

## 2018-10-21 ENCOUNTER — Other Ambulatory Visit: Payer: Self-pay

## 2018-10-21 ENCOUNTER — Ambulatory Visit (INDEPENDENT_AMBULATORY_CARE_PROVIDER_SITE_OTHER): Payer: Commercial Managed Care - PPO | Admitting: Women's Health

## 2018-10-21 VITALS — BP 142/96 | HR 103 | Temp 98.4°F | Wt 252.4 lb

## 2018-10-21 DIAGNOSIS — O10919 Unspecified pre-existing hypertension complicating pregnancy, unspecified trimester: Secondary | ICD-10-CM

## 2018-10-21 DIAGNOSIS — Z3A37 37 weeks gestation of pregnancy: Secondary | ICD-10-CM

## 2018-10-21 DIAGNOSIS — O24419 Gestational diabetes mellitus in pregnancy, unspecified control: Secondary | ICD-10-CM

## 2018-10-21 DIAGNOSIS — O0993 Supervision of high risk pregnancy, unspecified, third trimester: Secondary | ICD-10-CM | POA: Diagnosis not present

## 2018-10-21 DIAGNOSIS — O10913 Unspecified pre-existing hypertension complicating pregnancy, third trimester: Secondary | ICD-10-CM

## 2018-10-21 DIAGNOSIS — Z331 Pregnant state, incidental: Secondary | ICD-10-CM

## 2018-10-21 DIAGNOSIS — Z1389 Encounter for screening for other disorder: Secondary | ICD-10-CM

## 2018-10-21 DIAGNOSIS — O99210 Obesity complicating pregnancy, unspecified trimester: Secondary | ICD-10-CM

## 2018-10-21 LAB — POCT URINALYSIS DIPSTICK OB
Blood, UA: NEGATIVE
Ketones, UA: NEGATIVE
Leukocytes, UA: NEGATIVE
Nitrite, UA: NEGATIVE
POC,PROTEIN,UA: NEGATIVE

## 2018-10-21 MED ORDER — GLYBURIDE 5 MG PO TABS: 10.0000 mg | ORAL_TABLET | Freq: Every day | ORAL | 11 refills | Status: DC

## 2018-10-21 NOTE — Progress Notes (Signed)
HIGH-RISK PREGNANCY VISIT Patient name: Marissa Lane MRN [id number]  Date of birth: Sep 15, 1990 Chief Complaint:   High Risk Gestation (u/s today)  History of Present Illness:   Marissa Lane is a 28 y.o. G60P2002 female at [redacted]w[redacted]d with an Estimated Date of Delivery: 11/08/18 being seen today for ongoing management of a high-risk pregnancy complicated by CHTN-no meds, A2DM on metformin 1,000mg  BID, glyburide 5mg  pm, Lantus 20u hs; suspected LGA 99% @ 36wks.  Today she reports not checking sugars consistently. Only 4 FBS in 1 wk (84, 96, 104, 101), only 5 2hrPP in 1wk (111,124,114,120,149-this was a 1hr), 'forgets'. 7lb wt gain in 1 wk, states her diet has been off d/t coronavirus/access to food. BP higher than what she has been recently. HA last night, went away after laying down. Denies persistent visual changes/ruq or epigastric pain.  Contractions: Irregular.  .  Movement: Present. denies leaking of fluid.  Review of Systems:   Pertinent items are noted in HPI Denies abnormal vaginal discharge w/ itching/odor/irritation, headaches, visual changes, shortness of breath, chest pain, abdominal pain, severe nausea/vomiting, or problems with urination or bowel movements unless otherwise stated above. Pertinent History Reviewed:  Reviewed past medical,surgical, social, obstetrical and family history.  Reviewed problem list, medications and allergies. Physical Assessment:   Vitals:   10/21/18 1132  BP: (!) 142/96  Pulse: (!) 103  Temp: 98.4 F (36.9 C)  Weight: 252 lb 6.4 oz (114.5 kg)  Body mass index is 43.32 kg/m.           Physical Examination:   General appearance: alert, well appearing, and in no distress  Mental status: alert, oriented to person, place, and time  Skin: warm & dry   Extremities: Edema: Trace    Cardiovascular: normal heart rate noted  Respiratory: normal respiratory effort, no distress  Abdomen: gravid, soft, non-tender  Pelvic: Cervical exam deferred          Fetal Status: Fetal Heart Rate (bpm): 157 u/s Fundal Height: 39 cm Movement: Present Presentation: Vertex  Fetal Surveillance Testing today: [place] 02+5 wks,cephalic,BPP 4/2,HCWCBJSE placenta gr 3,afi 9 cm,fhr 157 bpm,RI .53,.62,.64=67%  Results for orders placed or performed in visit on 10/21/18 (from the past 24 hour(s))  POC Urinalysis Dipstick OB   Collection Time: 10/21/18 11:39 AM  Result Value Ref Range   Color, UA     Clarity, UA     Glucose, UA Trace (A) Negative   Bilirubin, UA     Ketones, UA neg    Spec Grav, UA     Blood, UA neg    pH, UA     POC,PROTEIN,UA Negative Negative, Trace, Small (1+), Moderate (2+), Large (3+), 4+   Urobilinogen, UA     Nitrite, UA neg    Leukocytes, UA Negative Negative   Appearance     Odor      Assessment & Plan:  1) High-risk pregnancy G3P2002 at [redacted]w[redacted]d with an Estimated Date of Delivery: 11/08/18   2) CHTN-no meds, bp slightly higher than her recents. Currently asymptomatic. Will get pre-e labs. Discussed pre-e s/s, reasons to seek care. Recommended bp check w/ nurse Friday, prefers to check bp's at home. To start checking QID, write down, bring to visit. If >160 or 110 call us/go to [facility].   3) A2DM, non-compliant w/ QID testing, majority FBS still high. Continue metformin 1,000mg  BID, increase glyburide to 10mg  PM, continue Lantus 20u qhs  4) Suspected LGA> 3875g/99% at 36wks, extrapolates to 4450g @  39wks. Largest baby [phone].   Meds:  Meds ordered this encounter  Medications  . glyBURIDE (DIABETA) 5 MG tablet    Sig: Take 2 tablets (10 mg total) by mouth daily with supper.    Dispense:  30 tablet    Refill:  11    Order Specific Question:   Supervising Provider    Answer:   Florian Buff [[id number]]   Labs/procedures today: u/s  Treatment Plan:  Weekly bpp/dopp, IOL @ 39wks  Reviewed: Term labor symptoms and general obstetric precautions including but not limited to vaginal bleeding, contractions, leaking of fluid and fetal movement  were reviewed in detail with the patient.  All questions were answered.  Follow-up: Return for As scheduled 1wk for bpp/dopp, hrob.  Orders Placed This Encounter  Procedures  . US FETAL BPP WO NON STRESS  . [place] UA Cord Doppler  . CBC  . Comprehensive metabolic panel  . Protein / creatinine ratio, urine  . POC Urinalysis Dipstick OB   Roma Schanz CNM, [facility] 10/21/2018 1:14 PM

## 2018-10-21 NOTE — Progress Notes (Signed)
[place] 77+4 wks,cephalic,BPP 1/2,INOMVEHM placenta gr 3,afi 9 cm,fhr 157 bpm,RI .53,.62,.64=67%

## 2018-10-21 NOTE — Patient Instructions (Addendum)
Marissa Lane, I greatly value your feedback.  If you receive a survey following your visit with [place] today, we appreciate you taking the time to fill it out.  Thanks, [name], CNM, [facility]  [place]!!! It is now [facility] at [facility] ([place], [place] [address]) Entrance located off of [address] parking   Check your blood pressure 4 times daily and keep a log, bring this with you to all appointments.  If blood pressure is >=160 on top or >=110 on bottom, check again in 5 minutes, if still this high, call us or go to [facility] to be evaluated    Call the office [phone]) or go to [facility] if:  You begin to have strong, frequent contractions  Your water breaks.  Sometimes it is a big gush of fluid, sometimes it is just a trickle that keeps getting your panties wet or running down your legs  You have vaginal bleeding.  It is normal to have a small amount of spotting if your cervix was checked.   You don't feel your baby moving like normal.  If you don't, get you something to eat and drink and lay down and focus on feeling your baby move.  You should feel at least 10 movements in 2 hours.  If you don't, you should call the office or go to [facility].    Preeclampsia and Eclampsia  Preeclampsia is a serious condition that may develop during pregnancy. It is also called toxemia of pregnancy. This condition causes high blood pressure along with other symptoms, such as swelling and headaches. These symptoms may develop as the condition gets worse. Preeclampsia may occur at 20 weeks of pregnancy or later. Diagnosing and treating preeclampsia early is very important. If not treated early, it can cause serious problems for you and your baby. One problem it can lead to is eclampsia. Eclampsia is a condition that causes muscle jerking or shaking (convulsions or seizures) and other serious problems for the  mother. During pregnancy, delivering your baby may be the best treatment for preeclampsia or eclampsia. For most women, preeclampsia and eclampsia symptoms go away after giving birth. In rare cases, a woman may develop preeclampsia after giving birth (postpartum preeclampsia). This usually occurs within 48 hours after childbirth but may occur up to 6 weeks after giving birth. What are the causes? The cause of preeclampsia is not known. What increases the risk? The following risk factors make you more likely to develop preeclampsia:  Being pregnant for the first time.  Having had preeclampsia during a past pregnancy.  Having a family history of preeclampsia.  Having high blood pressure.  Being pregnant with more than one baby.  Being 52 or older.  Being African-American.  Having kidney disease or diabetes.  Having medical conditions such as lupus or blood diseases.  Being very overweight (obese). What are the signs or symptoms? The earliest signs of preeclampsia are:  High blood pressure.  Increased protein in your urine. Your health care provider will check for this at every visit before you give birth (prenatal visit). Other symptoms that may develop as the condition gets worse include:  Severe headaches.  Sudden weight gain.  Swelling of the hands, face, legs, and feet.  Nausea and vomiting.  Vision problems, such as blurred or double vision.  Numbness in the face, arms, legs, and feet.  Urinating less than usual.  Dizziness.  Slurred speech.  Abdominal pain, especially upper abdominal pain.  Convulsions or seizures. How is this diagnosed? There are no screening tests for preeclampsia. Your health care provider will ask you about symptoms and check for signs of preeclampsia during your prenatal visits. You may also have tests that include:  Urine tests.  Blood tests.  Checking your blood pressure.  Monitoring your babys heart  rate.  Ultrasound. How is this treated? You and your health care provider will determine the treatment approach that is best for you. Treatment may include:  Having more frequent prenatal exams to check for signs of preeclampsia, if you have an increased risk for preeclampsia.  Medicine to lower your blood pressure.  Staying in the hospital, if your condition is severe. There, treatment will focus on controlling your blood pressure and the amount of fluids in your body (fluid retention).  Taking medicine (magnesium sulfate) to prevent seizures. This may be given as an injection or through an IV.  Taking a low-dose aspirin during your pregnancy.  Delivering your baby early, if your condition gets worse. You may have your labor started with medicine (induced), or you may have a cesarean delivery. Follow these instructions at home: Eating and drinking   Drink enough fluid to keep your urine pale yellow.  Avoid caffeine. Lifestyle  Do not use any products that contain nicotine or tobacco, such as cigarettes and e-cigarettes. If you need help quitting, ask your health care provider.  Do not use alcohol or drugs.  Avoid stress as much as possible. Rest and get plenty of sleep. General instructions  Take over-the-counter and prescription medicines only as told by your health care provider.  When lying down, lie on your left side. This keeps pressure off your major blood vessels.  When sitting or lying down, raise (elevate) your feet. Try putting some pillows underneath your lower legs.  Exercise regularly. Ask your health care provider what kinds of exercise are best for you.  Keep all follow-up and prenatal visits as told by your health care provider. This is important. How is this prevented? There is no known way of preventing preeclampsia or eclampsia from developing. However, to lower your risk of complications and detect problems early:  Get regular prenatal care. Your  health care provider may be able to diagnose and treat the condition early.  Maintain a healthy weight. Ask your health care provider for help managing weight gain during pregnancy.  Work with your health care provider to manage any long-term (chronic) health conditions you have, such as diabetes or kidney problems.  You may have tests of your blood pressure and kidney function after giving birth.  Your health care provider may have you take low-dose aspirin during your next pregnancy. Contact a health care provider if:  You have symptoms that your health care provider told you may require more treatment or monitoring, such as: ? Headaches. ? Nausea or vomiting. ? Abdominal pain. ? Dizziness. ? Light-headedness. Get help right away if:  You have severe: ? Abdominal pain. ? Headaches that do not get better. ? Dizziness. ? Vision problems. ? Confusion. ? Nausea or vomiting.  You have any of the following: ? A seizure. ? Sudden, rapid weight gain. ? Sudden swelling in your hands, ankles, or face. ? Trouble moving any part of your body. ? Numbness in any part of your body. ? Trouble speaking. ? Abnormal bleeding.  You faint. Summary  Preeclampsia is a serious condition that may develop during pregnancy.  It is also called toxemia of pregnancy.  This condition causes high blood pressure along with other symptoms, such as swelling and headaches.  Diagnosing and treating preeclampsia early is very important. If not treated early, it can cause serious problems for you and your baby.  Get help right away if you have symptoms that your health care provider told you to watch for. This information is not intended to replace advice given to you by your health care provider. Make sure you discuss any questions you have with your health care provider. Document Released: 07/05/2000 Document Revised: 06/24/2017 Document Reviewed: 02/12/2016 Elsevier Interactive Patient Education  2019  [employer].

## 2018-10-22 ENCOUNTER — Encounter (HOSPITAL_COMMUNITY): Payer: Self-pay | Admitting: *Deleted

## 2018-10-22 ENCOUNTER — Inpatient Hospital Stay (HOSPITAL_COMMUNITY)
Admission: AD | Admit: 2018-10-22 | Discharge: 2018-10-22 | Disposition: A | Discharge status: Home / Self Care | Payer: Commercial Managed Care - PPO | Attending: Obstetrics and Gynecology | Admitting: Obstetrics and Gynecology

## 2018-10-22 ENCOUNTER — Other Ambulatory Visit: Payer: Self-pay

## 2018-10-22 DIAGNOSIS — E669 Obesity, unspecified: Secondary | ICD-10-CM | POA: Diagnosis not present

## 2018-10-22 DIAGNOSIS — O99213 Obesity complicating pregnancy, third trimester: Secondary | ICD-10-CM | POA: Insufficient documentation

## 2018-10-22 DIAGNOSIS — Z3A37 37 weeks gestation of pregnancy: Secondary | ICD-10-CM | POA: Diagnosis not present

## 2018-10-22 DIAGNOSIS — Z888 Allergy status to other drugs, medicaments and biological substances status: Secondary | ICD-10-CM | POA: Diagnosis not present

## 2018-10-22 DIAGNOSIS — O10913 Unspecified pre-existing hypertension complicating pregnancy, third trimester: Secondary | ICD-10-CM

## 2018-10-22 DIAGNOSIS — Z885 Allergy status to narcotic agent status: Secondary | ICD-10-CM | POA: Insufficient documentation

## 2018-10-22 DIAGNOSIS — Z9049 Acquired absence of other specified parts of digestive tract: Secondary | ICD-10-CM | POA: Diagnosis not present

## 2018-10-22 DIAGNOSIS — Z800 Family history of malignant neoplasm of digestive organs: Secondary | ICD-10-CM | POA: Insufficient documentation

## 2018-10-22 DIAGNOSIS — Z8249 Family history of ischemic heart disease and other diseases of the circulatory system: Secondary | ICD-10-CM | POA: Diagnosis not present

## 2018-10-22 DIAGNOSIS — Z3689 Encounter for other specified antenatal screening: Secondary | ICD-10-CM | POA: Diagnosis present

## 2018-10-22 DIAGNOSIS — Z87891 Personal history of nicotine dependence: Secondary | ICD-10-CM | POA: Diagnosis not present

## 2018-10-22 DIAGNOSIS — O24415 Gestational diabetes mellitus in pregnancy, controlled by oral hypoglycemic drugs: Secondary | ICD-10-CM | POA: Diagnosis not present

## 2018-10-22 DIAGNOSIS — O10919 Unspecified pre-existing hypertension complicating pregnancy, unspecified trimester: Secondary | ICD-10-CM

## 2018-10-22 LAB — COMPREHENSIVE METABOLIC PANEL
ALT: 15 IU/L (ref 0–32)
ALT: 17 U/L (ref 0–44)
AST: 20 IU/L (ref 0–40)
AST: 25 U/L (ref 15–41)
Albumin/Globulin Ratio: 1.2 (ref 1.2–2.2)
Albumin: 3.1 g/dL — ABNORMAL LOW (ref 3.9–5.0)
Albumin: 2.4 g/dL — ABNORMAL LOW (ref 3.5–5.0)
Alkaline Phosphatase: 129 IU/L — ABNORMAL HIGH (ref 39–117)
Alkaline Phosphatase: 123 U/L (ref 38–126)
Anion gap: 6 (ref 5–15)
BUN/Creatinine Ratio: 14 (ref 9–23)
BUN: 8 mg/dL (ref 6–20)
BUN: 7 mg/dL (ref 6–20)
Bilirubin Total: <0.2 mg/dL (ref 0.0–1.2)
CO2: 21 mmol/L (ref 20–29)
CO2: 20 mmol/L — ABNORMAL LOW (ref 22–32)
Calcium: 8.8 mg/dL (ref 8.7–10.2)
Calcium: 8.6 mg/dL — ABNORMAL LOW (ref 8.9–10.3)
Chloride: 105 mmol/L (ref 96–106)
Chloride: 108 mmol/L (ref 98–111)
Creatinine, Ser: 0.57 mg/dL (ref 0.57–1.00)
Creatinine, Ser: 0.49 mg/dL (ref 0.44–1.00)
GFR calc Af Amer: 146 mL/min/{1.73_m2} (ref >59)
GFR calc Af Amer: >60 mL/min (ref >60)
GFR calc non Af Amer: 127 mL/min/{1.73_m2} (ref >59)
GFR calc non Af Amer: >60 mL/min (ref >60)
Globulin, Total: 2.5 g/dL (ref 1.5–4.5)
Glucose, Bld: 139 mg/dL — ABNORMAL HIGH (ref 70–99)
Glucose: 102 mg/dL — ABNORMAL HIGH (ref 65–99)
Potassium: 4.3 mmol/L (ref 3.5–5.2)
Potassium: 3.8 mmol/L (ref 3.5–5.1)
Sodium: 140 mmol/L (ref 134–144)
Sodium: 134 mmol/L — ABNORMAL LOW (ref 135–145)
Total Bilirubin: 0.3 mg/dL (ref 0.3–1.2)
Total Protein: 5.6 g/dL — ABNORMAL LOW (ref 6.0–8.5)
Total Protein: 5.7 g/dL — ABNORMAL LOW (ref 6.5–8.1)

## 2018-10-22 LAB — CBC
HCT: 35.5 % — ABNORMAL LOW (ref 36.0–46.0)
Hematocrit: 33.7 % — ABNORMAL LOW (ref 34.0–46.6)
Hemoglobin: 12.2 g/dL (ref 11.1–15.9)
Hemoglobin: 11.9 g/dL — ABNORMAL LOW (ref 12.0–15.0)
MCH: 30.7 pg (ref 26.6–33.0)
MCH: 29.3 pg (ref 26.0–34.0)
MCHC: 36.2 g/dL — ABNORMAL HIGH (ref 31.5–35.7)
MCHC: 33.5 g/dL (ref 30.0–36.0)
MCV: 85 fL (ref 79–97)
MCV: 87.4 fL (ref 80.0–100.0)
Platelets: 216 10*3/uL (ref 150–450)
Platelets: 211 10*3/uL (ref 150–400)
RBC: 3.98 x10E6/uL (ref 3.77–5.28)
RBC: 4.06 MIL/uL (ref 3.87–5.11)
RDW: 12.4 % (ref 11.7–15.4)
RDW: 12.7 % (ref 11.5–15.5)
WBC: 5.6 10*3/uL (ref 3.4–10.8)
WBC: 7.1 10*3/uL (ref 4.0–10.5)
nRBC: 0 % (ref 0.0–0.2)

## 2018-10-22 LAB — PROTEIN / CREATININE RATIO, URINE
Creatinine, Urine: 190.1 mg/dL
Creatinine, Urine: 48.72 mg/dL
Protein Creatinine Ratio: 0.12 mg/mg{Cre} (ref 0.00–0.15)
Protein, Ur: 30.6 mg/dL
Protein/Creat Ratio: 161 mg/g creat (ref 0–200)
Total Protein, Urine: 6 mg/dL

## 2018-10-22 LAB — URINALYSIS, ROUTINE W REFLEX MICROSCOPIC
Bilirubin Urine: NEGATIVE
Glucose, UA: NEGATIVE mg/dL
Hgb urine dipstick: NEGATIVE
Ketones, ur: NEGATIVE mg/dL
Leukocytes,Ua: NEGATIVE
Nitrite: NEGATIVE
Protein, ur: NEGATIVE mg/dL
Specific Gravity, Urine: 1.01 (ref 1.005–1.030)
pH: 7 (ref 5.0–8.0)

## 2018-10-22 NOTE — MAU Provider Note (Signed)
History     CSN: [id number]  Arrival date and time: 10/22/18 1726   First Provider Initiated Contact with Patient 10/22/18 1817      Chief Complaint  Patient presents with  . Hypertension   G3P2002 '@37' .4 wks w/hx of CHTN presenting with elevated BPs at home. Reports BP was 140s/90s today. Denies HA, visual disturbances, RUQ pain, SOB, and CP. Reports good FM. No LOF, VB, or ctx. Also reports her mother in law is on a ventilator d/t cancer and she would like to have the baby before she dies.    OB History    Gravida  3   Para  2   Term  2   Preterm      AB      Living  2     SAB      TAB      Ectopic      Multiple  0   Live Births  2           Past Medical History:  Diagnosis Date  . Acne   . Chest pain   . Chronic hypertension   . Diabetes mellitus without complication ([place])   . Dysthymic disorder    over dose age 28  . GERD (gastroesophageal reflux disease)   . Gestational diabetes    metformin  . H/O urinary frequency 06/25/2013  . Irregular intermenstrual bleeding 10/12/2013  . Obesity   . Palpitations   . PCOS (polycystic ovarian syndrome)   . Postpartum depression   . Tobacco abuse     Past Surgical History:  Procedure Laterality Date  . CHOLECYSTECTOMY  03/06/2012   Procedure: LAPAROSCOPIC CHOLECYSTECTOMY;  Surgeon: Donato Heinz, MD;  Location: [facility] ORS;  Service: General;  Laterality: N/A;  . ESOPHAGOGASTRODUODENOSCOPY N/A 09/09/2012   Procedure: ESOPHAGOGASTRODUODENOSCOPY (EGD);  Surgeon: Rogene Houston, MD;  Location: [facility] ENDO SUITE;  Service: Endoscopy;  Laterality: N/A;  340  . MOUTH SURGERY    . SKIN GRAFT  2009   gum graft  . TONSILLECTOMY      Family History  Adopted: Yes  Problem Relation Age of Onset  . Congestive Heart Failure Mother   . Irritable bowel syndrome Mother   . Ulcers Mother   . Anemia Mother   . Hypertension Brother   . Colon cancer Maternal Grandmother 35  . Heart disease Maternal Grandfather 33        MI    Social History   Tobacco Use  . Smoking status: Former Smoker    Packs/day: 0.25    Years: 10.00    Pack years: 2.50    Types: Cigarettes  . Smokeless tobacco: Never Used  . Tobacco comment: quit 3 weeks ago  Substance Use Topics  . Alcohol use: No  . Drug use: No    Allergies:  Allergies  Allergen Reactions  . Bupropion Other (See Comments)    WORSENING DEPRESSION  . Cymbalta [Duloxetine Hcl] Other (See Comments)    Worsening depression  . Vicodin [Hydrocodone-Acetaminophen] Other (See Comments)    Bad migraine  . Zofran [Ondansetron Hcl] Itching    Medications Prior to Admission  Medication Sig Dispense Refill Last Dose  . ACCU-CHEK FASTCLIX LANCETS MISC 1 each by Percutaneous route 4 (four) times daily. 100 each 12 10/22/2018 at Unknown time  . aspirin EC 81 MG tablet Take 162 mg by mouth daily.   10/21/2018 at Unknown time  . Blood Glucose Monitoring Suppl (ACCU-CHEK GUIDE) w/Device KIT 1 each  by Does not apply route 4 (four) times daily. 1 kit 0 10/22/2018 at Unknown time  . Esomeprazole Magnesium (NEXIUM PO) Take by mouth daily.   10/21/2018 at Unknown time  . glucose blood (ACCU-CHEK GUIDE) test strip Use as instructed to check blood sugar 4 times daily 100 each 12 10/22/2018 at Unknown time  . glyBURIDE (DIABETA) 5 MG tablet Take 2 tablets (10 mg total) by mouth daily with supper. 30 tablet 11 10/21/2018 at Unknown time  . hydrocortisone-pramoxine (ANALPRAM-HC) 2.5-1 % rectal cream as needed.    Past Month at Unknown time  . Insulin Glargine (LANTUS) 100 UNIT/ML Solostar Pen Inject 20 Units into the skin at bedtime. 15 mL 11 10/21/2018 at Unknown time  . metFORMIN (GLUCOPHAGE) 1000 MG tablet Take 1 tablet (1,000 mg total) by mouth 2 (two) times daily with a meal. 60 tablet 11 10/21/2018 at Unknown time  . Prenatal Multivit-Min-Fe-FA (PRENATAL VITAMINS PO) Take by mouth.   10/21/2018 at Unknown time    Review of Systems  Eyes: Negative for visual disturbance.   Gastrointestinal: Negative for abdominal pain.  Genitourinary: Negative for vaginal bleeding and vaginal discharge.  Neurological: Negative for headaches.   Physical Exam   Blood pressure (!) 152/85, pulse (!) 108, temperature 98.4 F (36.9 C), resp. rate 18, height '5\' 4"'  (1.626 m), weight 115.7 kg, last menstrual period 02/01/2018. Patient Vitals for the past 24 hrs:  BP Temp Pulse Resp Height Weight  10/22/18 2009 (!) 152/85 - - - - -  10/22/18 2001 (!) 152/85 - (!) 108 - - -  10/22/18 1946 129/88 - (!) 103 - - -  10/22/18 1931 (!) 141/82 - (!) 105 - - -  10/22/18 1916 (!) 154/102 - (!) 106 - - -  10/22/18 1901 (!) 151/92 - (!) 112 - - -  10/22/18 1846 136/80 - (!) 103 - - -  10/22/18 1831 132/87 - (!) 107 - - -  10/22/18 1816 134/83 - (!) 109 - - -  10/22/18 1805 (!) 146/83 - (!) 107 - - -  10/22/18 1742 (!) 144/91 98.4 F (36.9 C) (!) 108 18 - -  10/22/18 1740 - - - - '5\' 4"'  (1.626 m) 115.7 kg   Physical Exam  Nursing note and vitals reviewed. Constitutional: She is oriented to person, place, and time. She appears well-developed and well-nourished. No distress.  HENT:  Head: Normocephalic and atraumatic.  Neck: Normal range of motion.  Respiratory: Effort normal. No respiratory distress.  Musculoskeletal: Normal range of motion.  Neurological: She is alert and oriented to person, place, and time.  Psychiatric: She has a normal mood and affect.  EFM: 140 bpm, mod variability, + accels, no decels Toco: irritability  Results for orders placed or performed during the hospital encounter of 10/22/18 (from the past 24 hour(s))  Urinalysis, Routine w reflex microscopic     Status: None   Collection Time: 10/22/18  5:54 PM  Result Value Ref Range   Color, Urine YELLOW YELLOW   APPearance CLEAR CLEAR   Specific Gravity, Urine 1.010 1.005 - 1.030   pH 7.0 5.0 - 8.0   Glucose, UA NEGATIVE NEGATIVE mg/dL   Hgb urine dipstick NEGATIVE NEGATIVE   Bilirubin Urine NEGATIVE NEGATIVE    Ketones, ur NEGATIVE NEGATIVE mg/dL   Protein, ur NEGATIVE NEGATIVE mg/dL   Nitrite NEGATIVE NEGATIVE   Leukocytes,Ua NEGATIVE NEGATIVE  Protein / creatinine ratio, urine     Status: None   Collection Time: 10/22/18  6:29  PM  Result Value Ref Range   Creatinine, Urine 48.72 mg/dL   Total Protein, Urine 6 mg/dL   Protein Creatinine Ratio 0.12 0.00 - 0.15 mg/mg[Cre]  CBC     Status: Abnormal   Collection Time: 10/22/18  7:01 PM  Result Value Ref Range   WBC 7.1 4.0 - 10.5 K/uL   RBC 4.06 3.87 - 5.11 MIL/uL   Hemoglobin 11.9 (L) 12.0 - 15.0 g/dL   HCT 35.5 (L) 36.0 - 46.0 %   MCV 87.4 80.0 - 100.0 fL   MCH 29.3 26.0 - 34.0 pg   MCHC 33.5 30.0 - 36.0 g/dL   RDW 12.7 11.5 - 15.5 %   Platelets 211 150 - 400 K/uL   nRBC 0.0 0.0 - 0.2 %  Comprehensive metabolic panel     Status: Abnormal   Collection Time: 10/22/18  7:01 PM  Result Value Ref Range   Sodium 134 (L) 135 - 145 mmol/L   Potassium 3.8 3.5 - 5.1 mmol/L   Chloride 108 98 - 111 mmol/L   CO2 20 (L) 22 - 32 mmol/L   Glucose, Bld 139 (H) 70 - 99 mg/dL   BUN 7 6 - 20 mg/dL   Creatinine, Ser 0.49 0.44 - 1.00 mg/dL   Calcium 8.6 (L) 8.9 - 10.3 mg/dL   Total Protein 5.7 (L) 6.5 - 8.1 g/dL   Albumin 2.4 (L) 3.5 - 5.0 g/dL   AST 25 15 - 41 U/L   ALT 17 0 - 44 U/L   Alkaline Phosphatase 123 38 - 126 U/L   Total Bilirubin 0.3 0.3 - 1.2 mg/dL   GFR calc non Af Amer >60 >60 mL/min   GFR calc Af Amer >60 >60 mL/min   Anion gap 6 5 - 15   MAU Course  Procedures  MDM Labs ordered and reviewed. No evidence of PEC. BPs stable. Stable for discharge home.   Assessment and Plan   1. [redacted] weeks gestation of pregnancy   2. NST (non-stress test) reactive   3. Chronic hypertension during pregnancy, antepartum    Discharge home [facility] at [facility] as scheduled Notify office if home BPs are elevated PEC precautions  Allergies as of 10/22/2018      Reactions   Bupropion Other (See Comments)   WORSENING DEPRESSION   Cymbalta  [duloxetine Hcl] Other (See Comments)   Worsening depression   Vicodin [hydrocodone-acetaminophen] Other (See Comments)   Bad migraine   Zofran [ondansetron Hcl] Itching      Medication List    TAKE these medications   Accu-Chek FastClix Lancets Misc 1 each by Percutaneous route 4 (four) times daily.   Accu-Chek Guide w/Device Kit 1 each by Does not apply route 4 (four) times daily.   aspirin EC 81 MG tablet Take 162 mg by mouth daily.   glucose blood test strip Commonly known as:  Accu-Chek Guide Use as instructed to check blood sugar 4 times daily   glyBURIDE 5 MG tablet Commonly known as:  Diabeta Take 2 tablets (10 mg total) by mouth daily with supper.   hydrocortisone-pramoxine 2.5-1 % rectal cream Commonly known as:  ANALPRAM-HC as needed.   Insulin Glargine 100 UNIT/ML Solostar Pen Commonly known as:  LANTUS Inject 20 Units into the skin at bedtime.   metFORMIN 1000 MG tablet Commonly known as:  GLUCOPHAGE Take 1 tablet (1,000 mg total) by mouth 2 (two) times daily with a meal.   NEXIUM PO Take by mouth daily.  PRENATAL VITAMINS PO Take by mouth.      Julianne Handler, CNM 10/22/2018, 8:18 PM

## 2018-10-22 NOTE — MAU Note (Signed)
Pt presents to MAU with complaints of increase in blood pressure, has been monitoring BPs at home. CHTN and GDM. Denies any pain.

## 2018-10-22 NOTE — Discharge Instructions (Signed)
Hypertension During Pregnancy ° °Hypertension is also called high blood pressure. High blood pressure means that the force of your blood moving in your body is too strong. When you are pregnant, this condition should be watched carefully. It can cause problems for you and your baby. °Follow these instructions at home: °Eating and drinking ° °· Drink enough fluid to keep your pee (urine) pale yellow. °· Avoid caffeine. °Lifestyle °· Do not use any products that contain nicotine or tobacco, such as cigarettes and e-cigarettes. If you need help quitting, ask your doctor. °· Do not use alcohol or drugs. °· Avoid stress. °· Rest and get plenty of sleep. °General instructions °· Take over-the-counter and prescription medicines only as told by your doctor. °· While lying down, lie on your left side. This keeps pressure off your major blood vessels. °· While sitting or lying down, raise (elevate) your feet. Try putting some pillows under your lower legs. °· Exercise regularly. Ask your doctor what kinds of exercise are best for you. °· Keep all prenatal and follow-up visits as told by your doctor. This is important. °Contact a doctor if: °· You have symptoms that your doctor told you to watch for, such as: °? Throwing up (vomiting). °? Feeling sick to your stomach (nausea). °? Headache. °Get help right away if you have: °· Very bad belly pain that does not get better with treatment. °· A very bad headache that does not get better. °· Throwing up that does not get better with treatment. °· Sudden, fast weight gain. °· Sudden swelling in your hands, ankles, or face. °· Bleeding from your vagina. °· Blood in your pee. °· Fewer movements from your baby than usual. °· Blurry vision. °· Double vision. °· Muscle twitching. °· Sudden muscle tightening (spasms). °· Trouble breathing. °· Blue fingernails or lips. °Summary °· Hypertension is also called high blood pressure. High blood pressure means that the force of your blood moving  in your body is too strong. °· When you are pregnant, this condition should be watched carefully. It can cause problems for you and your baby. °· Get help right away if you have symptoms that your doctor told you to watch for. °This information is not intended to replace advice given to you by your health care provider. Make sure you discuss any questions you have with your health care provider. °Document Released: 08/10/2010 Document Revised: 06/24/2017 Document Reviewed: 03/19/2016 °Elsevier Interactive Patient Education © 2019 Elsevier Inc. ° °

## 2018-10-26 ENCOUNTER — Encounter: Payer: Self-pay | Admitting: Obstetrics & Gynecology

## 2018-10-26 ENCOUNTER — Other Ambulatory Visit: Payer: Self-pay

## 2018-10-26 ENCOUNTER — Ambulatory Visit (INDEPENDENT_AMBULATORY_CARE_PROVIDER_SITE_OTHER): Payer: Commercial Managed Care - PPO | Admitting: Obstetrics & Gynecology

## 2018-10-26 ENCOUNTER — Ambulatory Visit (INDEPENDENT_AMBULATORY_CARE_PROVIDER_SITE_OTHER): Payer: Commercial Managed Care - PPO

## 2018-10-26 VITALS — BP 144/93 | HR 115 | Temp 98.2°F | Wt 255.0 lb

## 2018-10-26 DIAGNOSIS — Z3A38 38 weeks gestation of pregnancy: Secondary | ICD-10-CM | POA: Diagnosis not present

## 2018-10-26 DIAGNOSIS — O10913 Unspecified pre-existing hypertension complicating pregnancy, third trimester: Secondary | ICD-10-CM | POA: Diagnosis not present

## 2018-10-26 DIAGNOSIS — O24419 Gestational diabetes mellitus in pregnancy, unspecified control: Secondary | ICD-10-CM

## 2018-10-26 DIAGNOSIS — Z331 Pregnant state, incidental: Secondary | ICD-10-CM

## 2018-10-26 DIAGNOSIS — Z1389 Encounter for screening for other disorder: Secondary | ICD-10-CM

## 2018-10-26 DIAGNOSIS — O10919 Unspecified pre-existing hypertension complicating pregnancy, unspecified trimester: Secondary | ICD-10-CM

## 2018-10-26 DIAGNOSIS — O0993 Supervision of high risk pregnancy, unspecified, third trimester: Secondary | ICD-10-CM

## 2018-10-26 DIAGNOSIS — O99210 Obesity complicating pregnancy, unspecified trimester: Secondary | ICD-10-CM

## 2018-10-26 LAB — POCT URINALYSIS DIPSTICK OB
Blood, UA: NEGATIVE
Glucose, UA: NEGATIVE
Ketones, UA: NEGATIVE
Leukocytes, UA: NEGATIVE
Nitrite, UA: NEGATIVE
POC,PROTEIN,UA: NEGATIVE

## 2018-10-26 NOTE — Progress Notes (Signed)
Patient ID: Marissa Lane, female   DOB: February 18, 1991, 28 y.o.   MRN: [id number]   [facility] PREGNANCY VISIT Patient name: Marissa Lane MRN [id number]  Date of birth: 11-13-1990 Chief Complaint:   High Risk Gestation (u/s)  History of Present Illness:   Marissa Lane is a 28 y.o. G23P2002 female at [redacted]w[redacted]d with an Estimated Date of Delivery: 11/08/18 being seen today for ongoing management of a high-risk pregnancy complicated by chronic HTN, class  A2 DM. Suspected fetal macrosmia, BP is elevated the last few days but mother in law passed away and she declines IOL until after the funeral on Wednesday 10/28/2018.  Today she reports no complaints. Contractions: Not present.  .  Movement: Present. denies leaking of fluid.  Review of Systems:   Pertinent items are noted in HPI Denies abnormal vaginal discharge w/ itching/odor/irritation, headaches, visual changes, shortness of breath, chest pain, abdominal pain, severe nausea/vomiting, or problems with urination or bowel movements unless otherwise stated above. Pertinent History Reviewed:  Reviewed past medical,surgical, social, obstetrical and family history.  Reviewed problem list, medications and allergies. Physical Assessment:   Vitals:   10/26/18 1121 10/26/18 1132  BP: (!) 150/105 (!) 144/93  Pulse: (!) 119 (!) 115  Temp: 98.2 F (36.8 C)   Weight: 255 lb (115.7 kg)   Body mass index is 43.77 kg/m.           Physical Examination:   General appearance: alert, well appearing, and in no distress  Mental status: alert, oriented to person, place, and time  Skin: warm & dry   Extremities: Edema: Mild pitting, slight indentation    Cardiovascular: normal heart rate noted  Respiratory: normal respiratory effort, no distress  Abdomen: gravid, soft, non-tender  Pelvic: Cervical exam deferred         Fetal Status:     Movement: Present    Fetal Surveillance Testing today: BPP 8/8   Results for orders placed or performed in visit on  10/26/18 (from the past 24 hour(s))  POC Urinalysis Dipstick OB   Collection Time: 10/26/18 11:24 AM  Result Value Ref Range   Color, UA     Clarity, UA     Glucose, UA Negative Negative   Bilirubin, UA     Ketones, UA neg    Spec Grav, UA     Blood, UA neg    pH, UA     POC,PROTEIN,UA Negative Negative, Trace, Small (1+), Moderate (2+), Large (3+), 4+   Urobilinogen, UA     Nitrite, UA neg    Leukocytes, UA Negative Negative   Appearance     Odor      Assessment & Plan:  1) High-risk pregnancy G3P2002 at [redacted]w[redacted]d with an Estimated Date of Delivery: 11/08/18   2) Chronic Hypertension no meds required to this point, stable, with recent uptick in BP which I believe necissitates IOL prior to the planned [redacted] weeks gestation already planned  3) Suspected fetal macrosomia with 4450 grams extrapolated EFW to 39 weeks which patient realizes is at the limit of comfort with IOL in a diabetic patient, stable  Meds: No orders of the defined types were placed in this encounter.   Labs/procedures today: BPP 8/8  Treatment Plan:  IOL 10/29/2018  Reviewed: Term labor symptoms and general obstetric precautions including but not limited to vaginal bleeding, contractions, leaking of fluid and fetal movement were reviewed in detail with the patient.  All questions were answered.  Follow-up: Return in about 62  days (around 11/06/2018) for televisit BP check with nurse.  Orders Placed This Encounter  Procedures  . POC Urinalysis Dipstick OB   Mertie Clause Osvaldo Lamping  10/26/2018 11:45 AM

## 2018-10-26 NOTE — Progress Notes (Signed)
[place] 15+9 wks,cephalic,BPP 5/3,XYD 289 bpm,anterior placenta gr 3,afi 13 cm,RI .59,.49,.48=43%

## 2018-10-27 NOTE — Treatment Plan (Signed)
   Induction Assessment Scheduling Form: Fax to [facility]:  [phone]  ZOHAL RENY                                                                                   DOB:  [birth year]                                                            MRN:  [id number]                                                                     Phone #:    [phone]                        Provider:  [facility]  GP:  [id number]                                                            Estimated Date of Delivery: 11/08/18  Dating Criteria: 8 week sonogram   Medical Indications for induction:  Worsening hypertension in pregnancy, Class A2 DM, fetal macrosomia Admission Date/Time:  10/29/2018@0700  Gestational age on admission:  [redacted]w[redacted]d   Filed Weights   10/26/18 1121  Weight: 255 lb (115.7 kg)   HIV:  Non Reactive (01/14 0844) GBSpositive:    Cervical exam deferred   Method of induction(proposed):  choice   Scheduling Provider Signature:  Florian Buff, MD                                            Today's Date:  10/27/2018

## 2018-10-28 ENCOUNTER — Other Ambulatory Visit (HOSPITAL_COMMUNITY): Payer: Self-pay | Admitting: *Deleted

## 2018-10-28 ENCOUNTER — Encounter: Payer: Commercial Managed Care - PPO | Admitting: Advanced Practice Midwife

## 2018-10-28 ENCOUNTER — Encounter (HOSPITAL_COMMUNITY): Payer: Self-pay | Admitting: *Deleted

## 2018-10-28 ENCOUNTER — Other Ambulatory Visit: Payer: Commercial Managed Care - PPO

## 2018-10-28 ENCOUNTER — Telehealth (HOSPITAL_COMMUNITY): Payer: Self-pay | Admitting: *Deleted

## 2018-10-28 NOTE — Telephone Encounter (Signed)
Preadmission screen  

## 2018-10-29 ENCOUNTER — Encounter (HOSPITAL_COMMUNITY): Payer: Self-pay

## 2018-10-29 ENCOUNTER — Inpatient Hospital Stay (HOSPITAL_COMMUNITY): Payer: Commercial Managed Care - PPO

## 2018-10-29 ENCOUNTER — Inpatient Hospital Stay (HOSPITAL_COMMUNITY)
Admission: AD | Admit: 2018-10-29 | Discharge: 2018-11-01 | DRG: 807 | Disposition: A | Discharge status: Home / Self Care | Payer: Commercial Managed Care - PPO | Attending: Obstetrics and Gynecology | Admitting: Obstetrics and Gynecology

## 2018-10-29 ENCOUNTER — Encounter: Payer: Commercial Managed Care - PPO | Admitting: Advanced Practice Midwife

## 2018-10-29 ENCOUNTER — Inpatient Hospital Stay (HOSPITAL_COMMUNITY): Payer: Commercial Managed Care - PPO | Admitting: Anesthesiology

## 2018-10-29 DIAGNOSIS — E669 Obesity, unspecified: Secondary | ICD-10-CM | POA: Diagnosis present

## 2018-10-29 DIAGNOSIS — O10919 Unspecified pre-existing hypertension complicating pregnancy, unspecified trimester: Secondary | ICD-10-CM

## 2018-10-29 DIAGNOSIS — O10913 Unspecified pre-existing hypertension complicating pregnancy, third trimester: Secondary | ICD-10-CM | POA: Diagnosis present

## 2018-10-29 DIAGNOSIS — Z3A38 38 weeks gestation of pregnancy: Secondary | ICD-10-CM

## 2018-10-29 DIAGNOSIS — O99824 Streptococcus B carrier state complicating childbirth: Secondary | ICD-10-CM | POA: Diagnosis present

## 2018-10-29 DIAGNOSIS — O24425 Gestational diabetes mellitus in childbirth, controlled by oral hypoglycemic drugs: Secondary | ICD-10-CM | POA: Diagnosis present

## 2018-10-29 DIAGNOSIS — O99210 Obesity complicating pregnancy, unspecified trimester: Secondary | ICD-10-CM | POA: Diagnosis present

## 2018-10-29 DIAGNOSIS — O3663X0 Maternal care for excessive fetal growth, third trimester, not applicable or unspecified: Secondary | ICD-10-CM | POA: Diagnosis present

## 2018-10-29 DIAGNOSIS — O1002 Pre-existing essential hypertension complicating childbirth: Principal | ICD-10-CM | POA: Diagnosis present

## 2018-10-29 DIAGNOSIS — Z3490 Encounter for supervision of normal pregnancy, unspecified, unspecified trimester: Secondary | ICD-10-CM | POA: Diagnosis present

## 2018-10-29 DIAGNOSIS — Z87891 Personal history of nicotine dependence: Secondary | ICD-10-CM | POA: Diagnosis not present

## 2018-10-29 DIAGNOSIS — O99214 Obesity complicating childbirth: Secondary | ICD-10-CM | POA: Diagnosis present

## 2018-10-29 DIAGNOSIS — O24429 Gestational diabetes mellitus in childbirth, unspecified control: Secondary | ICD-10-CM

## 2018-10-29 DIAGNOSIS — O24419 Gestational diabetes mellitus in pregnancy, unspecified control: Secondary | ICD-10-CM

## 2018-10-29 DIAGNOSIS — I10 Essential (primary) hypertension: Secondary | ICD-10-CM | POA: Diagnosis present

## 2018-10-29 LAB — COMPREHENSIVE METABOLIC PANEL
ALT: 20 U/L (ref 0–44)
AST: 25 U/L (ref 15–41)
Albumin: 2.4 g/dL — ABNORMAL LOW (ref 3.5–5.0)
Alkaline Phosphatase: 138 U/L — ABNORMAL HIGH (ref 38–126)
Anion gap: 10 (ref 5–15)
BUN: 6 mg/dL (ref 6–20)
CO2: 20 mmol/L — ABNORMAL LOW (ref 22–32)
Calcium: 8.7 mg/dL — ABNORMAL LOW (ref 8.9–10.3)
Chloride: 105 mmol/L (ref 98–111)
Creatinine, Ser: 0.53 mg/dL (ref 0.44–1.00)
GFR calc Af Amer: >60 mL/min (ref >60)
GFR calc non Af Amer: >60 mL/min (ref >60)
Glucose, Bld: 173 mg/dL — ABNORMAL HIGH (ref 70–99)
Potassium: 3.9 mmol/L (ref 3.5–5.1)
Sodium: 135 mmol/L (ref 135–145)
Total Bilirubin: 0.4 mg/dL (ref 0.3–1.2)
Total Protein: 5.5 g/dL — ABNORMAL LOW (ref 6.5–8.1)

## 2018-10-29 LAB — URINALYSIS, ROUTINE W REFLEX MICROSCOPIC
Bilirubin Urine: NEGATIVE
Glucose, UA: NEGATIVE mg/dL
Hgb urine dipstick: NEGATIVE
Ketones, ur: NEGATIVE mg/dL
Leukocytes,Ua: NEGATIVE
Nitrite: NEGATIVE
Protein, ur: NEGATIVE mg/dL
Specific Gravity, Urine: 1.012 (ref 1.005–1.030)
pH: 7 (ref 5.0–8.0)

## 2018-10-29 LAB — CBC
HCT: 35.3 % — ABNORMAL LOW (ref 36.0–46.0)
HCT: 36 % (ref 36.0–46.0)
HCT: 35.3 % — ABNORMAL LOW (ref 36.0–46.0)
Hemoglobin: 11.8 g/dL — ABNORMAL LOW (ref 12.0–15.0)
Hemoglobin: 12.1 g/dL (ref 12.0–15.0)
Hemoglobin: 11.8 g/dL — ABNORMAL LOW (ref 12.0–15.0)
MCH: 29.9 pg (ref 26.0–34.0)
MCH: 30 pg (ref 26.0–34.0)
MCH: 29.2 pg (ref 26.0–34.0)
MCHC: 33.4 g/dL (ref 30.0–36.0)
MCHC: 33.6 g/dL (ref 30.0–36.0)
MCHC: 33.4 g/dL (ref 30.0–36.0)
MCV: 89.4 fL (ref 80.0–100.0)
MCV: 89.3 fL (ref 80.0–100.0)
MCV: 87.4 fL (ref 80.0–100.0)
Platelets: 207 10*3/uL (ref 150–400)
Platelets: 258 10*3/uL (ref 150–400)
Platelets: 222 10*3/uL (ref 150–400)
RBC: 3.95 MIL/uL (ref 3.87–5.11)
RBC: 4.03 MIL/uL (ref 3.87–5.11)
RBC: 4.04 MIL/uL (ref 3.87–5.11)
RDW: 12.6 % (ref 11.5–15.5)
RDW: 12.8 % (ref 11.5–15.5)
RDW: 12.5 % (ref 11.5–15.5)
WBC: 6.7 10*3/uL (ref 4.0–10.5)
WBC: 7.4 10*3/uL (ref 4.0–10.5)
WBC: 12.7 10*3/uL — ABNORMAL HIGH (ref 4.0–10.5)
nRBC: 0 % (ref 0.0–0.2)
nRBC: 0 % (ref 0.0–0.2)
nRBC: 0 % (ref 0.0–0.2)

## 2018-10-29 LAB — GLUCOSE, CAPILLARY
Glucose-Capillary: 157 mg/dL — ABNORMAL HIGH (ref 70–99)
Glucose-Capillary: 96 mg/dL (ref 70–99)
Glucose-Capillary: 84 mg/dL (ref 70–99)
Glucose-Capillary: 66 mg/dL — ABNORMAL LOW (ref 70–99)
Glucose-Capillary: 75 mg/dL (ref 70–99)

## 2018-10-29 LAB — TYPE AND SCREEN
ABO/RH(D): A POS
Antibody Screen: NEGATIVE

## 2018-10-29 MED ORDER — MISOPROSTOL 25 MCG QUARTER TABLET: 25.0000 ug | ORAL_TABLET | ORAL | Status: DC | PRN

## 2018-10-29 MED ORDER — SODIUM CHLORIDE (PF) 0.9 % IJ SOLN
INTRAMUSCULAR | Status: DC | PRN
  Administered 2018-10-29: 12 mL/h via EPIDURAL

## 2018-10-29 MED ORDER — LACTATED RINGERS IV SOLN: 500.0000 mL | Freq: Once | INTRAVENOUS | Status: DC

## 2018-10-29 MED ORDER — LACTATED RINGERS IV SOLN
INTRAVENOUS | Status: DC
  Administered 2018-10-29 (×2): via INTRAVENOUS

## 2018-10-29 MED ORDER — PENICILLIN G 3 MILLION UNITS IVPB - SIMPLE MED
3.0000 10*6.[IU] | INTRAVENOUS | Status: DC
  Administered 2018-10-29 (×3): 3 10*6.[IU] via INTRAVENOUS
  Filled 2018-10-29 (×3): qty 100

## 2018-10-29 MED ORDER — EPHEDRINE 5 MG/ML INJ: 10.0000 mg | INTRAVENOUS | Status: DC | PRN

## 2018-10-29 MED ORDER — FLEET ENEMA 7-19 GM/118ML RE ENEM: 1.0000 | ENEMA | Freq: Every day | RECTAL | Status: DC | PRN

## 2018-10-29 MED ORDER — ZOLPIDEM TARTRATE 5 MG PO TABS: 5.0000 mg | ORAL_TABLET | Freq: Every evening | ORAL | Status: DC | PRN

## 2018-10-29 MED ORDER — IBUPROFEN 600 MG PO TABS
600.0000 mg | ORAL_TABLET | Freq: Four times a day (QID) | ORAL | Status: DC
  Administered 2018-10-30 – 2018-11-01 (×10): 600 mg via ORAL
  Filled 2018-10-29 (×10): qty 1

## 2018-10-29 MED ORDER — OXYCODONE-ACETAMINOPHEN 5-325 MG PO TABS: 1.0000 | ORAL_TABLET | ORAL | Status: DC | PRN

## 2018-10-29 MED ORDER — SOD CITRATE-CITRIC ACID 500-334 MG/5ML PO SOLN: 30.0000 mL | ORAL | Status: DC | PRN

## 2018-10-29 MED ORDER — LACTATED RINGERS IV SOLN: 500.0000 mL | INTRAVENOUS | Status: DC | PRN

## 2018-10-29 MED ORDER — DIPHENHYDRAMINE HCL 25 MG PO CAPS: 25.0000 mg | ORAL_CAPSULE | Freq: Four times a day (QID) | ORAL | Status: DC | PRN

## 2018-10-29 MED ORDER — MEASLES, MUMPS & RUBELLA VAC IJ SOLR: 0.5000 mL | Freq: Once | INTRAMUSCULAR | Status: DC

## 2018-10-29 MED ORDER — SODIUM CHLORIDE 0.9 % IV SOLN
5.0000 10*6.[IU] | Freq: Once | INTRAVENOUS | Status: AC
  Administered 2018-10-29: 5 10*6.[IU] via INTRAVENOUS
  Filled 2018-10-29: qty 5

## 2018-10-29 MED ORDER — OXYCODONE-ACETAMINOPHEN 5-325 MG PO TABS: 2.0000 | ORAL_TABLET | ORAL | Status: DC | PRN

## 2018-10-29 MED ORDER — COCONUT OIL OIL: 1.0000 "application " | TOPICAL_OIL | Status: DC | PRN

## 2018-10-29 MED ORDER — TERBUTALINE SULFATE 1 MG/ML IJ SOLN: 0.2500 mg | Freq: Once | INTRAMUSCULAR | Status: DC | PRN

## 2018-10-29 MED ORDER — TETANUS-DIPHTH-ACELL PERTUSSIS 5-2.5-18.5 LF-MCG/0.5 IM SUSP: 0.5000 mL | Freq: Once | INTRAMUSCULAR | Status: DC

## 2018-10-29 MED ORDER — BUPIVACAINE HCL (PF) 0.25 % IJ SOLN
INTRAMUSCULAR | Status: DC | PRN
  Administered 2018-10-29: 5 mL via EPIDURAL

## 2018-10-29 MED ORDER — HYDROXYZINE HCL 50 MG PO TABS
50.0000 mg | ORAL_TABLET | Freq: Once | ORAL | Status: AC
  Administered 2018-10-29: 50 mg via ORAL
  Filled 2018-10-29: qty 1

## 2018-10-29 MED ORDER — ONDANSETRON HCL 4 MG/2ML IJ SOLN: 4.0000 mg | Freq: Four times a day (QID) | INTRAMUSCULAR | Status: DC | PRN

## 2018-10-29 MED ORDER — WITCH HAZEL-GLYCERIN EX PADS
1.0000 "application " | MEDICATED_PAD | CUTANEOUS | Status: DC | PRN
  Administered 2018-10-30: 1 via TOPICAL

## 2018-10-29 MED ORDER — LIDOCAINE HCL (PF) 1 % IJ SOLN
30.0000 mL | INTRAMUSCULAR | Status: DC | PRN
  Filled 2018-10-29: qty 30

## 2018-10-29 MED ORDER — MISOPROSTOL 50MCG HALF TABLET
50.0000 ug | ORAL_TABLET | ORAL | Status: DC | PRN
  Administered 2018-10-29: 09:00:00 50 ug via ORAL
  Filled 2018-10-29: qty 1

## 2018-10-29 MED ORDER — DIPHENHYDRAMINE HCL 50 MG/ML IJ SOLN: 12.5000 mg | INTRAMUSCULAR | Status: DC | PRN

## 2018-10-29 MED ORDER — METHYLERGONOVINE MALEATE 0.2 MG/ML IJ SOLN: 0.2000 mg | INTRAMUSCULAR | Status: DC | PRN

## 2018-10-29 MED ORDER — FERROUS SULFATE 325 (65 FE) MG PO TABS
325.0000 mg | ORAL_TABLET | Freq: Two times a day (BID) | ORAL | Status: DC
  Administered 2018-10-30 – 2018-11-01 (×5): 325 mg via ORAL
  Filled 2018-10-29 (×5): qty 1

## 2018-10-29 MED ORDER — OXYTOCIN BOLUS FROM INFUSION
500.0000 mL | Freq: Once | INTRAVENOUS | Status: AC
  Administered 2018-10-29: 500 mL via INTRAVENOUS

## 2018-10-29 MED ORDER — FENTANYL-BUPIVACAINE-NACL 0.5-0.125-0.9 MG/250ML-% EP SOLN
12.0000 mL/h | EPIDURAL | Status: DC | PRN
  Filled 2018-10-29: qty 250

## 2018-10-29 MED ORDER — PRENATAL MULTIVITAMIN CH
1.0000 | ORAL_TABLET | Freq: Every day | ORAL | Status: DC
  Administered 2018-10-30 – 2018-11-01 (×3): 1 via ORAL
  Filled 2018-10-29 (×3): qty 1

## 2018-10-29 MED ORDER — BENZOCAINE-MENTHOL 20-0.5 % EX AERO: 1.0000 "application " | INHALATION_SPRAY | CUTANEOUS | Status: DC | PRN

## 2018-10-29 MED ORDER — OXYTOCIN 40 UNITS IN NORMAL SALINE INFUSION - SIMPLE MED
1.0000 m[IU]/min | INTRAVENOUS | Status: DC
  Administered 2018-10-29: 2 m[IU]/min via INTRAVENOUS
  Filled 2018-10-29: qty 1000

## 2018-10-29 MED ORDER — PHENYLEPHRINE 40 MCG/ML (10ML) SYRINGE FOR IV PUSH (FOR BLOOD PRESSURE SUPPORT): 80.0000 ug | PREFILLED_SYRINGE | INTRAVENOUS | Status: DC | PRN

## 2018-10-29 MED ORDER — SIMETHICONE 80 MG PO CHEW: 80.0000 mg | CHEWABLE_TABLET | ORAL | Status: DC | PRN

## 2018-10-29 MED ORDER — DIBUCAINE (PERIANAL) 1 % EX OINT
1.0000 "application " | TOPICAL_OINTMENT | CUTANEOUS | Status: DC | PRN
  Administered 2018-10-30: 1 via RECTAL
  Filled 2018-10-29: qty 28

## 2018-10-29 MED ORDER — DOCUSATE SODIUM 100 MG PO CAPS
100.0000 mg | ORAL_CAPSULE | Freq: Two times a day (BID) | ORAL | Status: DC
  Administered 2018-10-30 – 2018-11-01 (×6): 100 mg via ORAL
  Filled 2018-10-29 (×6): qty 1

## 2018-10-29 MED ORDER — OXYTOCIN 40 UNITS IN NORMAL SALINE INFUSION - SIMPLE MED: 2.5000 [IU]/h | INTRAVENOUS | Status: DC

## 2018-10-29 MED ORDER — LIDOCAINE HCL (PF) 1 % IJ SOLN
INTRAMUSCULAR | Status: DC | PRN
  Administered 2018-10-29: 5 mL via EPIDURAL

## 2018-10-29 MED ORDER — ACETAMINOPHEN 325 MG PO TABS: 650.0000 mg | ORAL_TABLET | ORAL | Status: DC | PRN

## 2018-10-29 MED ORDER — PHENYLEPHRINE 40 MCG/ML (10ML) SYRINGE FOR IV PUSH (FOR BLOOD PRESSURE SUPPORT)
80.0000 ug | PREFILLED_SYRINGE | INTRAVENOUS | Status: DC | PRN
  Filled 2018-10-29: qty 10

## 2018-10-29 MED ORDER — METHYLERGONOVINE MALEATE 0.2 MG PO TABS: 0.2000 mg | ORAL_TABLET | ORAL | Status: DC | PRN

## 2018-10-29 MED ORDER — BISACODYL 10 MG RE SUPP: 10.0000 mg | Freq: Every day | RECTAL | Status: DC | PRN

## 2018-10-29 NOTE — Progress Notes (Signed)
Hypoglycemic Event  CBG: 66  Treatment: 4 oz apple juice  Symptoms: none  Follow-up CBG: Time: 1952 CBG Result:75  Possible Reasons for Event: lack of food   Marissa Lane

## 2018-10-29 NOTE — Discharge Summary (Addendum)
Postpartum Discharge Summary     Patient Name: Marissa Lane DOB: 07/30/1990 MRN: [id number]  Date of admission: 10/29/2018 Delivering Provider: Christin Fudge   Date of discharge: 11/01/2018  Admitting diagnosis: pregnancy Intrauterine pregnancy: [redacted]w[redacted]d     Secondary diagnosis:  Principal Problem:   Encounter for induction of labor Active Problems:   Chronic hypertension during pregnancy, antepartum   White classification A2 gestational diabetes mellitus   Obesity affecting pregnancy, antepartum   Chronic hypertension in obstetric context, third trimester  Additional problems: worsening BPs, no evidence of PEC     Discharge diagnosis: Term Pregnancy Delivered, Gestational Hypertension, CHTN and GDM A2                                                                                                Post partum procedures:None  Augmentation: AROM, Pitocin and Cytotec  Complications: None  Hospital course:  Onset of Labor With Vaginal Delivery     28 y.o. yo G3P2002 at [redacted]w[redacted]d was admitted for IOL on 10/29/2018. She has CHTN but had not required medication.  However, her BP started going up around 37.3 weeks.  Her mother in law passed away and she wanted to delay IOL until after the funeral.   Patient had an uncomplicated labor course as follows: She received a few doses of cytotec, then Pitocin, and delivered easily with a <10 minute 2nd stage. Membrane Rupture Time/Date: 7:57 PM ,10/29/2018   Intrapartum Procedures: Episiotomy: None [1] none                                        Lacerations:  None [1] none Patient had a delivery of a Viable infant. 10/29/2018  Information for the patient's newborn:  Dene, Landsberg [[id number]]  Delivery Method: Vaginal, Spontaneous(Filed from Delivery Summary)    [name] had an uncomplicated postpartum course except for requiring BP control. She did not receive Magnesium Sulfate. She was d/c'd on 2 BP meds. She will be seen in 2 days for  BP check. She is asymptomatic.Marland Kitchen  She is ambulating, tolerating a regular diet, passing flatus, and urinating well. Patient is discharged home in stable condition on 11/01/18.   Magnesium Sulfate recieved: No BMZ received: No  Physical exam  Vitals:   10/31/18 2305 11/01/18 0253 11/01/18 0554 11/01/18 0808  BP: (!) 129/97 (!) 146/95 (!) 144/101 137/90  Pulse: (!) 113 99 98 90  Resp:  18 18 18   Temp: 98.3 F (36.8 C) 98.2 F (36.8 C) 97.9 F (36.6 C) 98.3 F (36.8 C)  TempSrc: Oral Oral Oral Oral  SpO2: 98% 96% 98% 98%  Weight:      Height:       General: alert, cooperative and no distress Lochia: appropriate Uterine Fundus: firm Incision: N/A DVT Evaluation: No evidence of DVT seen on physical exam. Labs: Lab Results  Component Value Date   WBC 12.7 (H) 10/29/2018   HGB 11.8 (L) 10/29/2018   HCT 35.3 (L) 10/29/2018   MCV 87.4 10/29/2018  PLT 222 10/29/2018   CMP Latest Ref Rng & Units 10/29/2018  Glucose 70 - 99 mg/dL 173(H)  BUN 6 - 20 mg/dL 6  Creatinine 0.44 - 1.00 mg/dL 0.53  Sodium 135 - 145 mmol/L 135  Potassium 3.5 - 5.1 mmol/L 3.9  Chloride 98 - 111 mmol/L 105  CO2 22 - 32 mmol/L 20(L)  Calcium 8.9 - 10.3 mg/dL 8.7(L)  Total Protein 6.5 - 8.1 g/dL 5.5(L)  Total Bilirubin 0.3 - 1.2 mg/dL 0.4  Alkaline Phos 38 - 126 U/L 138(H)  AST 15 - 41 U/L 25  ALT 0 - 44 U/L 20    Discharge instruction: per After Visit Summary and "Baby and Me Booklet".  After visit meds:  Procardia xl 30 daily Enalapril 10 daily   Diet: routine diet  Activity: Advance as tolerated. Pelvic rest for 6 weeks.   Outpatient follow up:3-5 days for blood pressure check  Follow up Appt: Future Appointments  Date Time Provider [place]  11/03/2018  9:00 AM Florian Buff, MD [facility]  11/06/2018 10:30 AM [facility] NURSE TECH [facility]   Follow up Visit: Follow-up Information    [facility] Follow up in 2 day(s).   Specialty:  Obstetrics and  Gynecology Contact information: [address] [place] [phone]           Please schedule this patient for Postpartum visit in: 4 weeks with the following provider: Any provider, web ex visit For C/S patients schedule nurse incision check in weeks 2 weeks: no High risk pregnancy complicated by: O3AN and CHTN Delivery mode:  SVD Anticipated Birth Control:  IUD PP Procedures needed: BP check (already scheduled).  Will need IUD appt in 6 weeks, can do 2hr gtt at her IUD check visit (appx 4 weeks after IUD insertion) Schedule Integrated BH visit: no      Newborn Data: Live born female  Birth Weight:   APGAR: ,   Newborn Delivery   Birth date/time:  10/29/2018 21:16:00 Delivery type:  Vaginal, Spontaneous     Baby Feeding: Bottle Disposition:NICU   11/01/2018 Jonnie Kind, MD  Jonnie Kind 11/01/18  8:58 AM

## 2018-10-29 NOTE — H&P (Signed)
LABOR AND DELIVERY ADMISSION HISTORY AND PHYSICAL NOTE  Marissa Lane is a 28 y.o. female G51P2002 with IUP at 76w4dby LMP=7w UKoreapresenting for IOL for cHTN and A2GDM.  She reports positive fetal movement. She denies leakage of fluid or vaginal bleeding.  [facility]  - AV9YIA- cHTN  Past Medical History: Past Medical History:  Diagnosis Date  . Acne   . Chest pain   . Chronic hypertension   . Diabetes mellitus without complication ([place]   . Dysthymic disorder    over dose age 89 . GERD (gastroesophageal reflux disease)   . Gestational diabetes    metformin  . H/O urinary frequency 06/25/2013  . Irregular intermenstrual bleeding 10/12/2013  . Obesity   . Palpitations   . PCOS (polycystic ovarian syndrome)   . Postpartum depression   . Tobacco abuse     Past Surgical History: Past Surgical History:  Procedure Laterality Date  . CHOLECYSTECTOMY  03/06/2012   Procedure: LAPAROSCOPIC CHOLECYSTECTOMY;  Surgeon: BDonato Heinz MD;  Location: [facility] ORS;  Service: General;  Laterality: N/A;  . ESOPHAGOGASTRODUODENOSCOPY N/A 09/09/2012   Procedure: ESOPHAGOGASTRODUODENOSCOPY (EGD);  Surgeon: NRogene Houston MD;  Location: [facility] ENDO SUITE;  Service: Endoscopy;  Laterality: N/A;  340  . MOUTH SURGERY    . SKIN GRAFT  2009   gum graft  . TONSILLECTOMY      Obstetrical History: OB History    Gravida  3   Para  2   Term  2   Preterm      AB      Living  2     SAB      TAB      Ectopic      Multiple  0   Live Births  2           Social History: Social History   Socioeconomic History  . Marital status: Married    Spouse name: JMekaila Tarnow . Number of children: 2  . Years of education: Not on file  . Highest education level: Associate degree: occupational, tHotel manager or vocational program  Occupational History  . Occupation: CAutomotive engineer Social Needs  . Financial  resource strain: Not very hard  . Food insecurity:    Worry: Never true    Inability: Never true  . Transportation needs:    Medical: No    Non-medical: No  Tobacco Use  . Smoking status: Former Smoker    Packs/day: 0.25    Years: 10.00    Pack years: 2.50    Types: Cigarettes  . Smokeless tobacco: Never Used  . Tobacco comment: quit 3 weeks ago  Substance and Sexual Activity  . Alcohol use: No  . Drug use: No  . Sexual activity: Yes    Birth control/protection: None  Lifestyle  . Physical activity:    Days per week: 7 days    Minutes per session: 20 min  . Stress: Only a little  Relationships  . Social connections:    Talks on phone: More than three times a week    Gets together: Once a week    Attends religious service: More than 4 times per year    Active member of club or organization: No    Attends meetings of clubs or organizations: Never    Relationship status: Married  Other Topics Concern  . Not on file  Social History Narrative  . Not  on file    Family History: Family History  Adopted: Yes  Problem Relation Age of Onset  . Congestive Heart Failure Mother   . Irritable bowel syndrome Mother   . Ulcers Mother   . Anemia Mother   . Hypertension Brother   . Colon cancer Maternal Grandmother 51  . Heart disease Maternal Grandfather 40       MI    Allergies: Allergies  Allergen Reactions  . Bupropion Other (See Comments)    WORSENING DEPRESSION  . Cymbalta [Duloxetine Hcl] Other (See Comments)    Worsening depression  . Vicodin [Hydrocodone-Acetaminophen] Other (See Comments)    Bad migraine  . Zofran [Ondansetron Hcl] Itching    Medications Prior to Admission  Medication Sig Dispense Refill Last Dose  . ACCU-CHEK FASTCLIX LANCETS MISC 1 each by Percutaneous route 4 (four) times daily. 100 each 12 Taking  . aspirin EC 81 MG tablet Take 162 mg by mouth daily.   Taking  . Blood Glucose Monitoring Suppl (ACCU-CHEK GUIDE) w/Device KIT 1 each by  Does not apply route 4 (four) times daily. 1 kit 0 Taking  . Esomeprazole Magnesium (NEXIUM PO) Take by mouth daily.   Taking  . glucose blood (ACCU-CHEK GUIDE) test strip Use as instructed to check blood sugar 4 times daily 100 each 12 Taking  . glyBURIDE (DIABETA) 5 MG tablet Take 2 tablets (10 mg total) by mouth daily with supper. 30 tablet 11 Taking  . hydrocortisone-pramoxine (ANALPRAM-HC) 2.5-1 % rectal cream as needed.    Not Taking  . Insulin Glargine (LANTUS) 100 UNIT/ML Solostar Pen Inject 20 Units into the skin at bedtime. 15 mL 11 Taking  . metFORMIN (GLUCOPHAGE) 1000 MG tablet Take 1 tablet (1,000 mg total) by mouth 2 (two) times daily with a meal. 60 tablet 11 Taking  . Prenatal Multivit-Min-Fe-FA (PRENATAL VITAMINS PO) Take by mouth.   Taking     Review of Systems  All systems reviewed and negative except as stated in HPI  Physical Exam Last menstrual period 02/01/2018. General appearance: alert, oriented, NAD Lungs: normal respiratory effort Heart: regular rate Abdomen: soft, non-tender; gravid, FH appropriate for GA Extremities: No calf swelling or tenderness Presentation: cephalic Fetal monitoring: baseline 150/mod var/+acels/no decels Uterine activity: none    Prenatal labs: ABO, Rh: A/Positive/-- (09/19 1124) Antibody: Negative (01/14 0844) Rubella: 2.44 (09/19 1124) RPR: Non Reactive (01/14 0844)  HBsAg: Negative (09/19 1124)  HIV: Non Reactive (01/14 0844)  GC/Chlamydia: negative GBS:   positive 2-hr GTT: 99/216/176 Genetic screening:  declined Anatomy US: normal  Prenatal Transfer Tool  Maternal Diabetes: Yes:  Diabetes Type:  Insulin/Medication controlled Genetic Screening: Declined Maternal Ultrasounds/Referrals: Normal Fetal Ultrasounds or other Referrals:  None Maternal Substance Abuse:  No Significant Maternal Medications:  None Significant Maternal Lab Results: None  No results found for this or any previous visit (from the past 24  hour(s)).  Patient Active Problem List   Diagnosis Date Noted  . Encounter for induction of labor 10/29/2018  . White classification A2 gestational diabetes mellitus 08/26/2018  . Obesity affecting pregnancy, antepartum 06/04/2018  . Supervision of high risk pregnancy, antepartum 04/09/2018  . Attention deficit hyperactivity disorder (ADHD), 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  . Cervical high risk HPV (human papillomavirus) test positive 01/09/2017  . History of gestational diabetes 07/19/2016  . Chronic hypertension during pregnancy, antepartum 04/30/2016  . Former smoker 03/05/2016  . Migraine without aura and with status migrainosus, not intractable 04/06/2015  . Anxiety 09/21/2013  . PCOS (polycystic  ovarian syndrome) 12/11/2012  . Erosive esophagitis 11/05/2012  . Morbid obesity ([place]) 09/14/2010    Assessment: Marissa Lane is a 28 y.o. G3P2002 at 21w4dhere for IOL for cHTN, A2GDM, suspected fetal macrosomia.   #Labor: start with cytotec. Will transition to pitocin when cervix more favorable  #Pain: Desires epidural #FWB: Cat 1 #ID:  GBS (+); PCN #MOF: breast #MOC: Mirena #Circ:  Outpatient   Arantza Darrington,MD 10/29/2018, 6:56 AM

## 2018-10-29 NOTE — Anesthesia Procedure Notes (Signed)
Epidural Patient location during procedure: OB Start time: 10/29/2018 5:11 PM End time: 10/29/2018 5:26 PM  Staffing Anesthesiologist: Barnet Glasgow, MD Performed: anesthesiologist   Preanesthetic Checklist Completed: patient identified, site marked, surgical consent, pre-op evaluation, timeout performed, IV checked, risks and benefits discussed and monitors and equipment checked  Epidural Patient position: sitting Prep: site prepped and draped and DuraPrep Patient monitoring: continuous pulse ox and blood pressure Approach: midline Location: L3-L4 Injection technique: LOR air  Needle:  Needle type: Tuohy  Needle gauge: 17 G Needle length: 9 cm and 9 Needle insertion depth: 7 cm Catheter type: closed end flexible Catheter size: 19 Gauge Catheter at skin depth: 12 cm Test dose: negative  Assessment Events: blood not aspirated, injection not painful, no injection resistance, negative IV test and no paresthesia  Additional Notes Patient identified. Risks/Benefits/Options discussed with patient including but not limited to bleeding, infection, nerve damage, paralysis, failed block, incomplete pain control, headache, blood pressure changes, nausea, vomiting, reactions to medication both or allergic, itching and postpartum back pain. Confirmed with bedside nurse the patient's most recent platelet count. Confirmed with patient that they are not currently taking any anticoagulation, have any bleeding history or any family history of bleeding disorders. Patient expressed understanding and wished to proceed. All questions were answered. Sterile technique was used throughout the entire procedure. Please see nursing notes for vital signs. Test dose was given through epidural needle and negative prior to continuing to dose epidural or start infusion. Warning signs of high block given to the patient including shortness of breath, tingling/numbness in hands, complete motor block, or any concerning  symptoms with instructions to call for help. Patient was given instructions on fall risk and not to get out of bed. All questions and concerns addressed with instructions to call with any issues. 1 Attempt (S) . Patient tolerated procedure well.

## 2018-10-29 NOTE — Progress Notes (Signed)
Labor Progress Note Marissa Lane is a 28 y.o. G3P2002 at [redacted]w[redacted]d presented for IOL for A2GDM and CHTN  S:  Feeling some mild ctx, irregular.   O:  BP (!) 152/74   Pulse 85   Temp 98.2 F (36.8 C) (Oral)   Resp 17   Ht 5\' 4"  (1.626 m)   Wt 117.2 kg   LMP 02/01/2018   BMI 44.34 kg/m  EFM: baseline 135 bpm/ mod variability/ + accels/ no decels  Toco: 2-3 SVE: Dilation: 3 Effacement (%): Thick Cervical Position: Middle Station: -2 Presentation: Vertex Exam by:: Julianne Handler, CNM CBG: 96  A/P: 28 y.o. G3P2002 [redacted]w[redacted]d  1. Labor: latent 2. FWB: Cat I 3. Pain: analgesia/anesthesia prn  S/p Cytotec x1, now favorable, will start Pitocin. BS stable. BP stable, no evidence of PEC. Anticipate labor progress and SVD.   Julianne Handler, CNM 12:15 PM

## 2018-10-29 NOTE — Anesthesia Preprocedure Evaluation (Addendum)
Anesthesia Evaluation  Patient identified by MRN, date of birth, ID band Patient awake    Reviewed: Allergy & Precautions, NPO status , Patient's Chart, lab work & pertinent test results  Airway Mallampati: III  TM Distance: >3 FB Neck ROM: Full    Dental no notable dental hx. (+) Teeth Intact   Pulmonary former smoker,    Pulmonary exam normal breath sounds clear to auscultation       Cardiovascular Exercise Tolerance: Good hypertension, Pt. on medications and Pt. on home beta blockers Normal cardiovascular exam Rhythm:Regular Rate:Normal     Neuro/Psych  Headaches, Anxiety Depression    GI/Hepatic GERD  ,  Endo/Other  diabetes  Renal/GU      Musculoskeletal   Abdominal (+) + obese,   Peds  Hematology Hgb 12.1 Plt 258   Anesthesia Other Findings   Reproductive/Obstetrics (+) Pregnancy                             Anesthesia Physical Anesthesia Plan  ASA: III  Anesthesia Plan: Epidural   Post-op Pain Management:    Induction:   PONV Risk Score and Plan:   Airway Management Planned:   Additional Equipment:   Intra-op Plan:   Post-operative Plan:   Informed Consent: I have reviewed the patients History and Physical, chart, labs and discussed the procedure including the risks, benefits and alternatives for the proposed anesthesia with the patient or authorized representative who has indicated his/her understanding and acceptance.       Plan Discussed with:   Anesthesia Plan Comments: (G64P2 w cHTN, GDM for LEA)        Anesthesia Quick Evaluation

## 2018-10-29 NOTE — Progress Notes (Signed)
Vitals:   10/29/18 2005 10/29/18 2024  BP: (!) 100/43   Pulse: 89   Resp: 18   Temp:  100.2 F (37.9 C)  SpO2:     AROM about an hour ago.  Cx 9-10/100/-2/  FHR cat 1, ctx q 2 minutes. BS 66 a few hours ago, given juice.  Anticipate delivery soon.

## 2018-10-30 LAB — RPR: RPR Ser Ql: NONREACTIVE

## 2018-10-30 LAB — ABO/RH: ABO/RH(D): A POS

## 2018-10-30 MED ORDER — ENALAPRIL MALEATE 5 MG PO TABS
10.0000 mg | ORAL_TABLET | Freq: Every day | ORAL | Status: DC
  Administered 2018-10-31: 10 mg via ORAL
  Filled 2018-10-30: qty 2

## 2018-10-30 MED ORDER — FUROSEMIDE 10 MG/ML IJ SOLN
20.0000 mg | Freq: Once | INTRAMUSCULAR | Status: AC
  Administered 2018-10-30: 20 mg via INTRAVENOUS
  Filled 2018-10-30 (×2): qty 2

## 2018-10-30 MED ORDER — ENALAPRIL MALEATE 5 MG PO TABS
5.0000 mg | ORAL_TABLET | Freq: Once | ORAL | Status: AC
  Administered 2018-10-30: 17:00:00 5 mg via ORAL
  Filled 2018-10-30: qty 1

## 2018-10-30 MED ORDER — ENALAPRIL MALEATE 5 MG PO TABS
5.0000 mg | ORAL_TABLET | Freq: Every day | ORAL | Status: DC
  Administered 2018-10-30: 5 mg via ORAL
  Filled 2018-10-30: qty 1

## 2018-10-30 NOTE — Anesthesia Postprocedure Evaluation (Signed)
Anesthesia Post Note  Patient: Marissa Lane  Procedure(s) Performed: AN AD [place]     Patient location during evaluation: Mother Baby Anesthesia Type: Epidural Level of consciousness: awake, awake and alert and oriented Pain management: pain level controlled Vital Signs Assessment: post-procedure vital signs reviewed and stable Respiratory status: spontaneous breathing and respiratory function stable Cardiovascular status: blood pressure returned to baseline Postop Assessment: no headache, no backache, epidural receding, patient able to bend at knees, no apparent nausea or vomiting, adequate PO intake and able to ambulate Anesthetic complications: no    Last Vitals:  Vitals:   10/30/18 0829 10/30/18 0937  BP: (!) 152/105 (!) 151/96  Pulse: 95 88  Resp: 20   Temp: 36.9 C   SpO2:      Last Pain:  Vitals:   10/30/18 1133  TempSrc:   PainSc: 0-No pain   Pain Goal: Patients Stated Pain Goal: 5 (10/29/18 1731)                 Bufford Spikes

## 2018-10-30 NOTE — Lactation Note (Signed)
This note was copied from a baby's chart. Lactation Consultation Note  Patient Name: Boy Vanda Waskey CHYIF'O Date: 10/30/2018   [facility] Initial Visit:  Attempted to visit mother, however, baby was recently transferred to NICU and mother is there visiting.  LC will return later today.     Zetha Kuhar R Debhora Titus 10/30/2018, 3:24 PM

## 2018-10-30 NOTE — Progress Notes (Signed)
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  CSW met with MOB to offer support and complete assessment.    MOB resting in bed with FOB walking around the room holding infant. CSW introduced self and role and received verbal permission to complete assessment with FOB present. CSW explained reason for consult was due to MOB's history of PPD. Per MOB, she had "pretty bad" PPD with her first child that she attributes to the "unhealthy" relationship she was in at the time. MOB stated she had some mild baby blues with 2nd child but symptoms only lasted for about a week or 2. MOB reported she has always had anxiety and depression and that she and FOB had a stressful pregnancy. Per MOB, FOB's mother passed away this week. CSW offered her condolences and inquired about how MOB and FOB were handling the loss. MOB and FOB reported they were doing "ok" and would "get through it". MOB reported having a good support system consisting of her father and FOB. CSW provided education regarding the baby blues period vs. perinatal mood disorders, discussed treatment and gave resources for mental health follow up if concerns arise.  CSW recommends self-evaluation during the postpartum time period using the New Mom Checklist from Postpartum Progress and encouraged MOB to contact a medical professional if symptoms are noted at any time.  MOB denied any current SI or HI.  MOB confirmed having all essential items for infant once discharged. CSW reported she was aware of Sudden Infant Death Syndrome (SIDS) precautions and safe sleeping habits.  MOB denied any current concerns or questions for CSW at this time. CSW identifies no further need for intervention and no barriers to discharge at this time.  Marissa Lane, LCSWA  [facility] [phone]   

## 2018-10-30 NOTE — Progress Notes (Signed)
Post Partum Day 1 Subjective: no complaints, up ad lib, voiding and tolerating PO, small lochia, plans to breastfeed, IUD  Objective: Blood pressure 137/90, pulse (!) 108, temperature 98.5 F (36.9 C), temperature source Oral, resp. rate 16, height 5\' 4"  (1.626 m), weight 117.2 kg, last menstrual period 02/01/2018, SpO2 98 %, unknown if currently breastfeeding.   Vitals:   10/29/18 2217 10/29/18 2320 10/30/18 0016 10/30/18 0348  BP: (!) 132/91 137/70 (!) 155/93 137/90  Pulse: (!) 118 (!) 124 (!) 118 (!) 108  Resp: 18 16 18 16   Temp:  99.1 F (37.3 C) 99.4 F (37.4 C) 98.5 F (36.9 C)  TempSrc:  Oral Oral Oral  SpO2:  99% 98% 98%  Weight:      Height:        Temp:  [98.2 F (36.8 C)-100.2 F (37.9 C)] 98.5 F (36.9 C) (04/10 0348) Pulse Rate:  [82-180] 108 (04/10 0348) Resp:  [16-20] 16 (04/10 0348) BP: (100-172)/(43-126) 137/90 (04/10 0348) SpO2:  [98 %-100 %] 98 % (04/10 0348) Weight:  [117.2 kg] 117.2 kg (04/09 0748) I/O last 3 completed shifts: In: 0  Out: 945 [Urine:725; Blood:220] No intake/output data recorded.  Patient Vitals for the past 12 hrs:  BP Temp Temp src Pulse Resp SpO2  10/30/18 0348 137/90 98.5 F (36.9 C) Oral (!) 108 16 98 %  10/30/18 0016 (!) 155/93 99.4 F (37.4 C) Oral (!) 118 18 98 %  10/29/18 2320 137/70 99.1 F (37.3 C) Oral (!) 124 16 99 %  10/29/18 2217 (!) 132/91 - - (!) 118 18 -  10/29/18 2202 119/67 - - (!) 110 17 -  10/29/18 2148 135/73 - - (!) 108 17 -  10/29/18 2132 130/66 - - (!) 119 17 -  10/29/18 2124 (!) 130/51 - - (!) 107 17 -  10/29/18 2024 - 100.2 F (37.9 C) Axillary - - -  10/29/18 2005 (!) 100/43 - - 89 18 -  10/29/18 1952 129/67 - - (!) 102 18 -  10/29/18 1947 117/74 - - (!) 115 18 -  10/29/18 1942 125/67 - - (!) 103 18 -  10/29/18 1937 104/66 - - 96 18 -  10/29/18 1932 111/87 - - (!) 180 18 -  10/29/18 1928 (!) 128/58 - - (!) 116 18 -  10/29/18 1926 124/63 - - (!) 105 - -     Physical Exam:  General:  alert, cooperative and no distress Lochia:normal flow Chest: CTAB Heart: RRR no m/r/g Abdomen: +BS, soft, nontender,  Uterine Fundus: firm DVT Evaluation: No evidence of DVT seen on physical exam. Extremities: 1+ edema  Recent Labs    10/29/18 1651 10/29/18 2223  HGB 12.1 11.8*  HCT 36.0 35.3*    Assessment/Plan: CHTN, no meds prior to today.  IOL for worsening BPs, and still elevated after delivery. Vasotec 5mg  daily   LOS: 1 day   Christin Fudge 10/30/2018, 7:05 AM

## 2018-10-31 ENCOUNTER — Encounter (HOSPITAL_COMMUNITY): Payer: Self-pay | Admitting: *Deleted

## 2018-10-31 MED ORDER — ENALAPRIL MALEATE 5 MG PO TABS: 10.0000 mg | ORAL_TABLET | Freq: Every day | ORAL | Status: DC

## 2018-10-31 MED ORDER — NIFEDIPINE 10 MG PO CAPS
10.0000 mg | ORAL_CAPSULE | ORAL | Status: DC | PRN
  Administered 2018-10-31: 10 mg via ORAL
  Filled 2018-10-31 (×2): qty 1

## 2018-10-31 MED ORDER — NIFEDIPINE 10 MG PO CAPS
20.0000 mg | ORAL_CAPSULE | ORAL | Status: DC | PRN
  Administered 2018-10-31: 19:00:00 20 mg via ORAL
  Filled 2018-10-31 (×2): qty 2

## 2018-10-31 MED ORDER — NIFEDIPINE 10 MG PO CAPS
20.0000 mg | ORAL_CAPSULE | ORAL | Status: DC | PRN
  Filled 2018-10-31: qty 2

## 2018-10-31 MED ORDER — LABETALOL HCL 5 MG/ML IV SOLN: 40.0000 mg | INTRAVENOUS | Status: DC | PRN

## 2018-10-31 MED ORDER — ENALAPRIL MALEATE 5 MG PO TABS
10.0000 mg | ORAL_TABLET | Freq: Every day | ORAL | Status: DC
  Administered 2018-11-01: 10 mg via ORAL
  Filled 2018-10-31: qty 2

## 2018-10-31 MED ORDER — ENALAPRIL MALEATE 5 MG PO TABS: 5.0000 mg | ORAL_TABLET | Freq: Every day | ORAL | Status: DC

## 2018-10-31 NOTE — Lactation Note (Signed)
This note was copied from a baby's chart. Lactation Consultation Note Mom's 3rd baby. Her 29 yr old she didn't BF. Her 1 yr old she BF for 6 weeks breast/formula feeding d/t not producing enough.  Baby transferred to NICU d/t hypoglycemia.  Mom has tried to latch baby, hasn't been successful. Will take formula in bottle well. Mom disappointed that baby will not latch and that baby has been given a pacifier in NICU.  Mom has DEBP set up in room, has been pumping. Mom stated pumped a few drops. Colostrum containers given by LC. Mom has a lot of edema and having blood pressure issues.  Discussed importance of pumping and hand expression. Encouraged to call for assistance in latching.  Encouraged STS. Brochure given to mom.   Patient Name: Marissa Lane LMBEM'L Date: 10/31/2018 Reason for consult: Initial assessment;NICU baby;Maternal endocrine disorder Type of Endocrine Disorder?: Diabetes(PCOS)   Maternal Data Has patient been taught Hand Expression?: Yes Does the patient have breastfeeding experience prior to this delivery?: Yes  Feeding Feeding Type: Breast Fed  LATCH Score Latch: Too sleepy or reluctant, no latch achieved, no sucking elicited.  Audible Swallowing: A few with stimulation  Type of Nipple: Everted at rest and after stimulation  Comfort (Breast/Nipple): Soft / non-tender  Hold (Positioning): No assistance needed to correctly position infant at breast.  LATCH Score: 7  Interventions Interventions: DEBP;Breast massage;Breast compression  Lactation Tools Discussed/Used Tools: Pump Breast pump type: Double-Electric Breast Pump Pump Review: Setup, frequency, and cleaning;Milk Storage Initiated by:: RN Date initiated:: 10/30/18   Consult Status Consult Status: Follow-up Date: 10/31/18 Follow-up type: In-patient    Shalayah Beagley, Elta Guadeloupe 10/31/2018, 1:01 AM

## 2018-10-31 NOTE — Progress Notes (Signed)
CNM at bedside for vitals. Will put new orders in

## 2018-10-31 NOTE — Progress Notes (Signed)
Post Partum Day 2 Subjective: no complaints, up ad lib, voiding, tolerating PO and + flatus  Patient has been up and down to the NICU multiple times today. She denies any HA, visual disturbance or RQU pain   Objective: Blood pressure (!) 161/103, pulse 93, temperature 97.8 F (36.6 C), temperature source Oral, resp. rate 18, height 5\' 4"  (1.626 m), weight 117.2 kg, last menstrual period 02/01/2018, SpO2 100 %, unknown if currently breastfeeding.  On return from NICU BP was 167/113 on recheck BP 161/103  Physical Exam:  General: alert and cooperative Lochia: appropriate Uterine Fundus: firm Incision: NA DVT Evaluation: No evidence of DVT seen on physical exam.  Recent Labs    10/29/18 1651 10/29/18 2223  HGB 12.1 11.8*  HCT 36.0 35.3*    Assessment/Plan: Plan for discharge tomorrow   5:27 PM consult with Dr. Kennon Rounds, will give PO procardia per the protocol and continue to monitor. No new labs needed at this time. May consider DC tomorrow if BP is trending better.    LOS: 2 days   Marcille Buffy DNP, CNM  10/31/18  5:27 PM

## 2018-10-31 NOTE — Progress Notes (Signed)
Mom in NICU. Asked dad to have mom call nurse desk when she returns

## 2018-11-01 MED ORDER — NIFEDIPINE ER OSMOTIC RELEASE 30 MG PO TB24
30.0000 mg | ORAL_TABLET | Freq: Every day | ORAL | Status: DC
  Administered 2018-11-01: 30 mg via ORAL

## 2018-11-01 MED ORDER — ENALAPRIL MALEATE 10 MG PO TABS: 10.0000 mg | ORAL_TABLET | Freq: Every day | ORAL | 1 refills | Status: DC

## 2018-11-01 MED ORDER — NIFEDIPINE 10 MG PO CAPS: 10.0000 mg | ORAL_CAPSULE | ORAL | 1 refills | Status: DC | PRN

## 2018-11-01 MED ORDER — NIFEDIPINE ER OSMOTIC RELEASE 30 MG PO TB24: 30.0000 mg | ORAL_TABLET | Freq: Every day | ORAL | 1 refills | Status: DC

## 2018-11-01 MED ORDER — PNEUMOCOCCAL VAC POLYVALENT 25 MCG/0.5ML IJ INJ: 0.5000 mL | INJECTION | INTRAMUSCULAR | Status: DC

## 2018-11-01 NOTE — Lactation Note (Signed)
This note was copied from a baby's chart. Lactation Consultation Note  Patient Name: Marissa Lane WSFKC'L Date: 11/01/2018 Reason for consult: Follow-up assessment;NICU baby;Early term 37-38.6wks;Maternal endocrine disorder Type of Endocrine Disorder?: Diabetes  Visited with P3 Mom of ET infant in the NICU.  Baby is off IV and taking more volume by bottle.  He may be discharged today, Mom to be discharged today, and will stay with baby in the NICU if he remains another day.   Reviewed double pumping routine and benefits of breast massage, hand expression, and having baby STS with her.  Mom aware of lactation assistance available in the NICU, and to ask her baby's nurse prn.  Encouraged STS and feeding baby/pumping for baby >8 times per 24 hrs.  Mom has a DEBP at home.  Mom aware to take all the pump parts with her to use with Symphony pump in the NICU.  Reviewed washing and rinsing of pump parts. Reviewed engorgement prevention and treatment.  Mom denies any questions at the time. Knows to call prn for concerns.   Interventions Interventions: Breast feeding basics reviewed;Skin to skin;Breast massage;Hand express;DEBP  Lactation Tools Discussed/Used Tools: Pump Breast pump type: Double-Electric Breast Pump   Consult Status Consult Status: Complete Date: 11/01/18 Follow-up type: Call as needed    Broadus John 11/01/2018, 8:30 AM

## 2018-11-02 ENCOUNTER — Telehealth: Payer: Self-pay | Admitting: *Deleted

## 2018-11-02 NOTE — Telephone Encounter (Signed)
Patient informed that we are not allowing visitors or children to come to appointments at this time. Patient denies any contact with anyone suspected or confirmed of having COVID-19. Pt denies fever, cough, sob, muscle pain, diarrhea, rash, vomiting, abdominal pain, red eye, weakness, bruising or bleeding, joint pain or severe headache.  

## 2018-11-03 ENCOUNTER — Other Ambulatory Visit: Payer: Self-pay | Admitting: Obstetrics & Gynecology

## 2018-11-03 ENCOUNTER — Ambulatory Visit: Payer: Commercial Managed Care - PPO | Admitting: Obstetrics & Gynecology

## 2018-11-03 ENCOUNTER — Ambulatory Visit: Payer: Commercial Managed Care - PPO

## 2018-11-03 ENCOUNTER — Other Ambulatory Visit: Payer: Self-pay

## 2018-11-03 VITALS — BP 148/98 | HR 93 | Temp 98.3°F | Ht 64.0 in | Wt 235.4 lb

## 2018-11-03 DIAGNOSIS — Z0130 Encounter for examination of blood pressure without abnormal findings: Secondary | ICD-10-CM

## 2018-11-03 MED ORDER — ANALPRAM E 2.5-1 & 1 % RE KIT: 1.0000 | PACK | Freq: Three times a day (TID) | RECTAL | 11 refills | Status: DC

## 2018-11-03 NOTE — Progress Notes (Signed)
Pt here for blood pressure check 148/98 pulse 93. Just took blood pressure medication. Spoke Dr. Elonda Husky about reading.advise keep taking Vasotec. Make appointment 6 weeks. Pad CMA

## 2018-11-04 ENCOUNTER — Other Ambulatory Visit: Payer: Commercial Managed Care - PPO

## 2018-11-04 ENCOUNTER — Encounter: Payer: Commercial Managed Care - PPO | Admitting: Obstetrics and Gynecology

## 2018-11-09 ENCOUNTER — Encounter: Payer: Self-pay | Admitting: *Deleted

## 2018-11-23 MED ORDER — METOCLOPRAMIDE HCL 5 MG PO TABS: 5.0000 mg | ORAL_TABLET | Freq: Three times a day (TID) | ORAL | 1 refills | Status: DC

## 2018-11-24 NOTE — Telephone Encounter (Signed)
Pt states the pharmacy did not receive the medication and pt would like to see if this can be resent.

## 2018-12-02 ENCOUNTER — Encounter: Payer: Self-pay | Admitting: *Deleted

## 2018-12-03 ENCOUNTER — Encounter: Payer: Self-pay | Admitting: Women's Health

## 2018-12-03 ENCOUNTER — Other Ambulatory Visit: Payer: Self-pay

## 2018-12-03 ENCOUNTER — Ambulatory Visit (INDEPENDENT_AMBULATORY_CARE_PROVIDER_SITE_OTHER): Payer: Commercial Managed Care - PPO | Admitting: Women's Health

## 2018-12-03 DIAGNOSIS — Z3202 Encounter for pregnancy test, result negative: Secondary | ICD-10-CM | POA: Diagnosis not present

## 2018-12-03 DIAGNOSIS — Z8632 Personal history of gestational diabetes: Secondary | ICD-10-CM

## 2018-12-03 DIAGNOSIS — I10 Essential (primary) hypertension: Secondary | ICD-10-CM

## 2018-12-03 LAB — POCT URINE PREGNANCY: Preg Test, Ur: NEGATIVE

## 2018-12-03 NOTE — Patient Instructions (Addendum)
Stop procardia, check blood pressure daily, let us know if starts to creep back up >140 on top or >90 on bottom  Tips To Increase Milk Supply  Lots of water! Enough so that your urine is clear  Plenty of calories, if you're not getting enough calories, your milk supply can decrease  Breastfeed/pump often, every 2-3 hours x 20-23mins  Fenugreek 3 pills 3 times a day, this may make your urine smell like maple syrup  Mother's Milk Tea  Lactation cookies, google for the recipe  Real oatmeal  Body Armour

## 2018-12-03 NOTE — Progress Notes (Signed)
POSTPARTUM VISIT Patient name: Marissa Lane MRN [id number]  Date of birth: 06-Nov-1990 Chief Complaint:   postpartum visit (discuss birth control options)  History of Present Illness:   AVRIL Lane is a 28 y.o. G27P3003 Caucasian female being seen today for a postpartum visit. She is 5 weeks postpartum following a spontaneous vaginal delivery at 38.4 gestational weeks after IOL for CHTN, A2DM. Anesthesia: epidural. Laceration: none. I have fully reviewed the prenatal and intrapartum course. Pregnancy complicated by [facility] and D6UY. No meds prior to pregnancy. D/c'd on procardia 30 daily and norvasc 10mg  daily.  Postpartum course has been uncomplicated. Bleeding no bleeding. Bowel function is normal. Bladder function is normal.  Patient is sexually active. Last sexual activity: few days ago.  Contraception method is coitus interruptus and condoms.    Last pap 01/08/18.  Results were ASUCS w/ -HRHPV .  No LMP recorded.  Baby's course has been uncomplicated. Baby is feeding by breast & bottle.   Edinburgh Postpartum Depression Screening: negative Edinburgh Postnatal Depression Scale - 12/03/18 1042      Edinburgh Postnatal Depression Scale:  In the Past 7 Days   I have been able to laugh and see the funny side of things.  0    I have looked forward with enjoyment to things.  0    I have blamed myself unnecessarily when things went wrong.  0    I have been anxious or worried for no good reason.  2    I have felt scared or panicky for no good reason.  0    Things have been getting on top of me.  0    I have been so unhappy that I have had difficulty sleeping.  0    I have felt sad or miserable.  0    I have been so unhappy that I have been crying.  1    The thought of harming myself has occurred to me.  0    Edinburgh Postnatal Depression Scale Total  3      Review of Systems:   Pertinent items are noted in HPI Denies Abnormal vaginal discharge w/ itching/odor/irritation, headaches,  visual changes, shortness of breath, chest pain, abdominal pain, severe nausea/vomiting, or problems with urination or bowel movements. Pertinent History Reviewed:  Reviewed past medical,surgical, obstetrical and family history.  Reviewed problem list, medications and allergies. OB History  Gravida Para Term Preterm AB Living  3 3 3     3   SAB TAB Ectopic Multiple Live Births        0 3    # Outcome Date GA Lbr Len/2nd Weight Sex Delivery Anes PTL Lv  3 Term 10/29/18 [redacted]w[redacted]d 01:36 / 00:51 9 lb 9.3 oz (4.346 kg) M Vag-Spont EPI  LIV  2 Term 10/06/16 [redacted]w[redacted]d 09:12 / 00:15 7 lb 3.2 oz (3.265 kg) F Vag-Spont EPI  LIV     Complications: Gestational diabetes, Chronic hypertension  1 Term 09/20/09 [redacted]w[redacted]d  6 lb 8 oz (2.948 kg) F Vag-Spont EPI N LIV   Physical Assessment:   Vitals:   12/03/18 1038  BP: 120/75  Pulse: 91  Temp: 98.1 F (36.7 C)  Weight: 238 lb 8 oz (108.2 kg)  Height: 5\' 4"  (1.626 m)  Body mass index is 40.94 kg/m.       Physical Examination:   General appearance: alert, well appearing, and in no distress  Mental status: alert, oriented to person, place, and time  Skin: warm & dry  Cardiovascular: normal heart rate noted   Respiratory: normal respiratory effort, no distress   Breasts: deferred, no complaints   Abdomen: soft, non-tender   Pelvic: VULVA: normal appearing vulva with no masses, tenderness or lesions, UTERUS: uterus is normal size, shape, consistency and nontender  Rectal: no hemorrhoids  Extremities: no edema       Results for orders placed or performed in visit on 12/03/18 (from the past 24 hour(s))  POCT urine pregnancy   Collection Time: 12/03/18 10:44 AM  Result Value Ref Range   Preg Test, Ur Negative Negative    Assessment & Plan:  1) Postpartum exam 2) 5 wks s/p SVB after IOL for A2DM, CHTN 3) Breast & bottlefeeding 4) Depression screening 5) Contraception counseling, pt prefers coitus interruptus and condoms  6) A2DM during pregnancy> will  do GTT 12wks pp 7) CHTN> no meds prior to pregnancy, stop procardia, check bp daily, let us know if starts to creep up, webex in 2wks  Meds: No orders of the defined types were placed in this encounter.   Follow-up: Return for 2wks webex f/u, then 7/2 for pap & physical and sugar test.   Orders Placed This Encounter  Procedures  . POCT urine pregnancy    [place], [facility] 12/03/2018 11:25 AM

## 2018-12-08 ENCOUNTER — Telehealth: Payer: Self-pay | Admitting: Women's Health

## 2018-12-08 NOTE — Telephone Encounter (Signed)
Pt is wanting to see if she needs to stay out of work until after her appt on 5/28 or if she can be released back to work. She is needing to let short term disability know.

## 2018-12-09 ENCOUNTER — Encounter: Payer: Self-pay | Admitting: Family Medicine

## 2018-12-10 NOTE — Telephone Encounter (Signed)
Nurses please talk with mother  You will need to go into the child's chart Please) a phone message of potential thrush I recommend nystatin oral solution 2 mL's into the mouth via syringe apply 4 times daily It is fine for the child to swallow any of this Do this for 7 days May apply thin amount on any outer mouth rash 4 times daily 75 mL's

## 2018-12-10 NOTE — Telephone Encounter (Signed)
See pt's chart for response

## 2018-12-16 ENCOUNTER — Encounter: Payer: Self-pay | Admitting: *Deleted

## 2018-12-17 ENCOUNTER — Ambulatory Visit (INDEPENDENT_AMBULATORY_CARE_PROVIDER_SITE_OTHER): Payer: Commercial Managed Care - PPO | Admitting: Women's Health

## 2018-12-17 ENCOUNTER — Encounter: Payer: Self-pay | Admitting: Women's Health

## 2018-12-17 ENCOUNTER — Other Ambulatory Visit: Payer: Self-pay

## 2018-12-17 VITALS — BP 126/81 | HR 83 | Ht 64.0 in | Wt 241.0 lb

## 2018-12-17 DIAGNOSIS — Z0130 Encounter for examination of blood pressure without abnormal findings: Secondary | ICD-10-CM | POA: Diagnosis not present

## 2018-12-17 NOTE — Progress Notes (Signed)
   [place] VIRTUAL GYN VISIT ENCOUNTER NOTE Patient name: Marissa Lane MRN [id number]  Date of birth: Mar 12, 1991  I connected with patient on 12/17/18 at  2:30 PM EDT by telephone (unable to do webex right now) and verified that I am speaking with the correct person using two identifiers.  Due to COVID-19 recommendations, pt is not currently in the office.    I discussed the limitations, risks, security and privacy concerns of performing an evaluation and management service by telephone and the availability of in person appointments. I also discussed with the patient that there may be a patient responsible charge related to this service. The patient expressed understanding and agreed to proceed.   Chief Complaint:   Follow-up (blood pressure check)  History of Present Illness:   Marissa Lane is a 28 y.o. G87P3003 Caucasian female 7wks s/p SVB after IOL for A2DM, CHTN being evaluated today for bp check. Wasn't on bp meds prior to pregnancy, stopped procardia 2wks ago. No problems since. Wants to start back work on Monday, needs note. Was curious, so checked a 2hr pp sugar and was 130s. Has GTT scheduled for 7/7     Patient's last menstrual period was 12/16/2018. The current method of family planning is condoms and coitus interruptus. Last pap 01/08/18. Results were:  ASCUS w/ -HRHPV Review of Systems:   Pertinent items are noted in HPI Denies fever/chills, dizziness, headaches, visual disturbances, fatigue, shortness of breath, chest pain, abdominal pain, vomiting, abnormal vaginal discharge/itching/odor/irritation, problems with periods, bowel movements, urination, or intercourse unless otherwise stated above.  Pertinent History Reviewed:  Reviewed past medical,surgical, social, obstetrical and family history.  Reviewed problem list, medications and allergies. Physical Assessment:   Vitals:   12/17/18 1519  BP: 126/81  Pulse: 83  Weight: 241 lb (109.3 kg)  Height: 5\' 4"  (1.626 m)   Body mass index is 41.37 kg/m.       Physical Examination:   General:  Alert, oriented and cooperative.   Mental Status: Normal mood and affect perceived. Normal judgment and thought content.  Physical exam deferred due to nature of the encounter  No results found for this or any previous visit (from the past 24 hour(s)).  Assessment & Plan:  1) 7wks s/p SVB after IOL for A2DM and CHTN  2) BP check> bp great off procardia  3) A2DM during recent pregnancy> has 2hr GTT scheduled for 7/7  4) H/O abnormal pap> repeat 7/7  Meds: No orders of the defined types were placed in this encounter.   No orders of the defined types were placed in this encounter.   I discussed the assessment and treatment plan with the patient. The patient was provided an opportunity to ask questions and all were answered. The patient agreed with the plan and demonstrated an understanding of the instructions.   The patient was advised to call back or seek an in-person evaluation/go to the ED if the symptoms worsen or if the condition fails to improve as anticipated.  I provided 10 minutes of non-face-to-face time during this encounter.   Return for As scheduled 7/7 for 2hr GTT and , Pap & physical.  Roma Schanz CNM, [facility] 12/17/2018 3:32 PM

## 2018-12-25 ENCOUNTER — Telehealth: Payer: Self-pay | Admitting: Obstetrics & Gynecology

## 2018-12-25 NOTE — Telephone Encounter (Signed)
[name] w/[employer] called regarding patient's return to work date.  She wants to know if there was any complications after her 6 weeks.  [phone] ext [phone]

## 2018-12-25 NOTE — Telephone Encounter (Signed)
LMOVM for [name] at [place] that Marissa Lane was released to return to work on 12/21/18.  Did have CHTN and A2DM and is being followed with 2hr GTT on 7/7.  Informed she was released to return to work on 12/21/2018.

## 2018-12-31 ENCOUNTER — Encounter: Payer: Self-pay | Admitting: Nurse Practitioner

## 2019-01-11 ENCOUNTER — Telehealth: Payer: Self-pay | Admitting: Obstetrics & Gynecology

## 2019-01-11 ENCOUNTER — Encounter: Payer: Self-pay | Admitting: *Deleted

## 2019-01-11 NOTE — Telephone Encounter (Signed)
Patient called, she is needing a letter for short term disability for 12/09/18 - 12/21/18, stated [name] took her out longer due to preeclampsia.  She needs it to have her name and dob on it.  Fax # [phone]

## 2019-01-16 ENCOUNTER — Encounter: Payer: Self-pay | Admitting: Family Medicine

## 2019-01-26 ENCOUNTER — Other Ambulatory Visit: Payer: Commercial Managed Care - PPO | Admitting: Adult Health

## 2019-02-07 ENCOUNTER — Encounter: Payer: Self-pay | Admitting: Family Medicine

## 2019-02-19 ENCOUNTER — Ambulatory Visit (INDEPENDENT_AMBULATORY_CARE_PROVIDER_SITE_OTHER): Payer: Commercial Managed Care - PPO | Admitting: Nurse Practitioner

## 2019-02-19 ENCOUNTER — Other Ambulatory Visit: Payer: Self-pay

## 2019-02-19 DIAGNOSIS — F419 Anxiety disorder, unspecified: Secondary | ICD-10-CM | POA: Diagnosis not present

## 2019-02-19 MED ORDER — CLONAZEPAM 0.5 MG PO TABS: ORAL_TABLET | ORAL | 0 refills | Status: DC

## 2019-02-19 MED ORDER — ESCITALOPRAM OXALATE 10 MG PO TABS: 10.0000 mg | ORAL_TABLET | Freq: Every day | ORAL | 2 refills | Status: DC

## 2019-02-19 NOTE — Progress Notes (Signed)
Subjective:  Phone call  Patient ID: Marissa Lane, female    DOB: [birth year], 28 y.o.   MRN: [id number]  HPI  Patient calls to discuss an increase in her anxiety symptoms. Patient states she feels she needs something she can take PRN for anxiety. Patient states she has been on medication in the past to help at times.  Virtual Visit via Video Note  I connected with Marissa Lane on 02/19/19 at  1:40 PM EDT by a video enabled telemedicine application and verified that I am speaking with the correct person using two identifiers.  Location: Patient: home Provider: office   I discussed the limitations of evaluation and management by telemedicine and the availability of in person appointments. The patient expressed understanding and agreed to proceed.  History of Present Illness: Presents for complaints of a flareup of her anxiety.  Has a problem with this about every 2 to 3 years.  Her normal coping mechanisms have not been working as well.  Has had a new job for the past 3 weeks.  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  Denies any relationship problems.  Does have 2 small children at home.  Trouble sleeping, states her mind is racing.  Weight has been stable.  Irritable at times with difficulty getting along with others.  Has had some trouble focusing due to anxiety.  Has a rare panic attack.  Celexa helped her symptoms before but had to stop due to sexual side effects. GAD 7 : Generalized Anxiety Score 02/19/2019 04/18/2017 03/22/2017  Nervous, Anxious, on Edge 3 1 2   Control/stop worrying 3 1 2   Worry too much - different things 3 2 2   Trouble relaxing 3 1 2   Restless 2 1 3   Easily annoyed or irritable 3 1 1   Afraid - awful might happen 1 1 1   Total GAD 7 Score 18 8 13   Anxiety Difficulty Very difficult Not difficult at all Somewhat difficult    Denies suicidal or homicidal thoughts or ideation.   Observations/Objective: Today's visit was via telephone Physical exam was not possible for  this visit On phone conversation, patient is calm, alert and oriented.  Thoughts logical coherent and relevant.  Assessment and Plan: Problem List Items Addressed This Visit      Other   Anxiety - Primary   Relevant Medications   escitalopram (LEXAPRO) 10 MG tablet     Meds ordered this encounter  Medications  . escitalopram (LEXAPRO) 10 MG tablet    Sig: Take 1 tablet (10 mg total) by mouth daily.    Dispense:  30 tablet    Refill:  2    Order Specific Question:   Supervising Provider    Answer:   Sallee Lange A [id number]  . clonazePAM (KLONOPIN) 0.5 MG tablet    Sig: Take 1/2 tab po BID prn severe anxiety    Dispense:  20 tablet    Refill:  0    Order Specific Question:   Supervising Provider    Answer:   Sallee Lange A [[id number]]     Follow Up Instructions: Reviewed potential adverse effects of Lexapro, DC med and contact office if any problems.  Use Klonopin sparingly only for severe anxiety or panic attacks. Return in about 1 month (around 03/22/2019) for recheck.    I discussed the assessment and treatment plan with the patient. The patient was provided an opportunity to ask questions and all were answered. The patient agreed with the plan and demonstrated  an understanding of the instructions.   The patient was advised to call back or seek an in-person evaluation if the symptoms worsen or if the condition fails to improve as anticipated.  I provided 15 minutes of non-face-to-face time during this encounter.       Review of Systems     Objective:   Physical Exam        Assessment & Plan:

## 2019-02-20 ENCOUNTER — Encounter: Payer: Self-pay | Admitting: Nurse Practitioner

## 2019-03-09 ENCOUNTER — Other Ambulatory Visit: Payer: Self-pay

## 2019-03-09 DIAGNOSIS — Z20822 Contact with and (suspected) exposure to covid-19: Secondary | ICD-10-CM

## 2019-03-10 LAB — NOVEL CORONAVIRUS, NAA: SARS-CoV-2, NAA: NOT DETECTED

## 2019-04-06 DIAGNOSIS — Z029 Encounter for administrative examinations, unspecified: Secondary | ICD-10-CM

## 2019-04-27 ENCOUNTER — Ambulatory Visit (INDEPENDENT_AMBULATORY_CARE_PROVIDER_SITE_OTHER): Payer: BC Managed Care – PPO | Admitting: Adult Health

## 2019-04-27 ENCOUNTER — Encounter: Payer: Self-pay | Admitting: Adult Health

## 2019-04-27 ENCOUNTER — Other Ambulatory Visit: Payer: Self-pay

## 2019-04-27 ENCOUNTER — Other Ambulatory Visit (HOSPITAL_COMMUNITY)
Admission: RE | Admit: 2019-04-27 | Discharge: 2019-04-27 | Disposition: A | Discharge status: Home / Self Care | Payer: BC Managed Care – PPO | Source: Ambulatory Visit | Attending: Adult Health | Admitting: Adult Health

## 2019-04-27 VITALS — BP 133/94 | HR 108 | Ht 64.25 in | Wt 237.0 lb

## 2019-04-27 DIAGNOSIS — Z131 Encounter for screening for diabetes mellitus: Secondary | ICD-10-CM | POA: Insufficient documentation

## 2019-04-27 DIAGNOSIS — Z1329 Encounter for screening for other suspected endocrine disorder: Secondary | ICD-10-CM | POA: Insufficient documentation

## 2019-04-27 DIAGNOSIS — Z01419 Encounter for gynecological examination (general) (routine) without abnormal findings: Secondary | ICD-10-CM | POA: Insufficient documentation

## 2019-04-27 DIAGNOSIS — Z8632 Personal history of gestational diabetes: Secondary | ICD-10-CM

## 2019-04-27 NOTE — Progress Notes (Signed)
Patient ID: Marissa Lane, female   DOB: [birth year], 28 y.o.   MRN: [id number] History of Present Illness: Marissa Lane is a 28 year old white female, married, G3P3 in for well woman gyn exam and pap. 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 She is using condoms and is happy with that.  PCP is [facility].   Current Medications, Allergies, Past Medical History, Past Surgical History, Family History and Social History were reviewed in [employer] record.     Review of Systems: Patient denies any headaches, hearing loss, fatigue, blurred vision, shortness of breath, chest pain, abdominal pain, problems with bowel movements, urination, or intercourse. No joint pain or mood swings. Hard time losing weight and feels cold more often.    Physical Exam:BP (!) 133/94 (BP Location: Left Arm, Patient Position: Sitting, Cuff Size: Large)   Pulse (!) 108   Ht 5' 4.25" (1.632 m)   Wt 237 lb (107.5 kg)   LMP 04/13/2019 (Approximate)   Breastfeeding No   BMI 40.36 kg/m  General:  Well developed, well nourished, no acute distress Skin:  Warm and dry Neck:  Midline trachea, normal thyroid, good ROM, no lymphadenopathy Lungs; Clear to auscultation bilaterally Breast:  No dominant palpable mass, retraction, or nipple discharge Cardiovascular: Regular rate and rhythm Abdomen:  Soft, non tender, no hepatosplenomegaly Pelvic:  External genitalia is normal in appearance, no lesions.  The vagina is normal in appearance. Urethra has no lesions or masses. 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   Uterus is felt to be normal size, shape, and contour.  No adnexal masses or tenderness noted.Bladder is non tender, no masses felt. Extremities/musculoskeletal:  No swelling or varicosities noted, no clubbing or cyanosis Psych:  No mood changes, alert and cooperative,seems happy Fall risk is low PHQ 2 score 1, she is on Klonopin  Examination chaperoned by Levy Pupa LPN. She requests  labs  Impression and Plan: 1. Encounter for gynecological examination with Papanicolaou smear of cervix -pap with high risk HPV with 16/18 genotyping -decrease smoking -mammogram at 40  -check CBC and CMP  2. History of gestational diabetes -check A1c  3. Screening for diabetes mellitus -check A1c  4. Screening for thyroid disorder -check TSH and free T4

## 2019-04-28 LAB — COMPREHENSIVE METABOLIC PANEL
ALT: 39 IU/L — ABNORMAL HIGH (ref 0–32)
AST: 29 IU/L (ref 0–40)
Albumin/Globulin Ratio: 1.4 (ref 1.2–2.2)
Albumin: 4.2 g/dL (ref 3.9–5.0)
Alkaline Phosphatase: 86 IU/L (ref 39–117)
BUN/Creatinine Ratio: 13 (ref 9–23)
BUN: 8 mg/dL (ref 6–20)
Bilirubin Total: <0.2 mg/dL (ref 0.0–1.2)
CO2: 21 mmol/L (ref 20–29)
Calcium: 9.4 mg/dL (ref 8.7–10.2)
Chloride: 103 mmol/L (ref 96–106)
Creatinine, Ser: 0.64 mg/dL (ref 0.57–1.00)
GFR calc Af Amer: 139 mL/min/{1.73_m2} (ref >59)
GFR calc non Af Amer: 121 mL/min/{1.73_m2} (ref >59)
Globulin, Total: 3.1 g/dL (ref 1.5–4.5)
Glucose: 109 mg/dL — ABNORMAL HIGH (ref 65–99)
Potassium: 4 mmol/L (ref 3.5–5.2)
Sodium: 139 mmol/L (ref 134–144)
Total Protein: 7.3 g/dL (ref 6.0–8.5)

## 2019-04-28 LAB — CBC
Hematocrit: 42.7 % (ref 34.0–46.6)
Hemoglobin: 14.5 g/dL (ref 11.1–15.9)
MCH: 30 pg (ref 26.6–33.0)
MCHC: 34 g/dL (ref 31.5–35.7)
MCV: 88 fL (ref 79–97)
Platelets: 370 10*3/uL (ref 150–450)
RBC: 4.84 x10E6/uL (ref 3.77–5.28)
RDW: 13 % (ref 11.7–15.4)
WBC: 9.2 10*3/uL (ref 3.4–10.8)

## 2019-04-28 LAB — HEMOGLOBIN A1C
Est. average glucose Bld gHb Est-mCnc: 117 mg/dL
Hgb A1c MFr Bld: 5.7 % — ABNORMAL HIGH (ref 4.8–5.6)

## 2019-04-28 LAB — TSH: TSH: 0.832 u[IU]/mL (ref 0.450–4.500)

## 2019-04-28 LAB — T4, FREE: Free T4: 1.12 ng/dL (ref 0.82–1.77)

## 2019-05-06 LAB — CYTOLOGY - PAP
Adequacy: ABSENT
Chlamydia: NEGATIVE
Comment: NEGATIVE
Comment: NEGATIVE
Comment: NORMAL
Diagnosis: NEGATIVE
High risk HPV: NEGATIVE
Neisseria Gonorrhea: NEGATIVE

## 2019-05-14 ENCOUNTER — Other Ambulatory Visit: Payer: Self-pay

## 2019-05-14 ENCOUNTER — Ambulatory Visit (INDEPENDENT_AMBULATORY_CARE_PROVIDER_SITE_OTHER): Payer: BC Managed Care – PPO | Admitting: Family Medicine

## 2019-05-14 DIAGNOSIS — Z20822 Contact with and (suspected) exposure to covid-19: Secondary | ICD-10-CM

## 2019-05-14 DIAGNOSIS — J019 Acute sinusitis, unspecified: Secondary | ICD-10-CM | POA: Diagnosis not present

## 2019-05-14 MED ORDER — AMOXICILLIN 500 MG PO TABS: 500.0000 mg | ORAL_TABLET | Freq: Three times a day (TID) | ORAL | 0 refills | Status: DC

## 2019-05-14 NOTE — Progress Notes (Signed)
   Subjective:    Patient ID: Marissa Lane, female    DOB: April 09, 1991, 28 y.o.   MRN: [id number]  Cough This is a new problem. The current episode started yesterday. The cough is non-productive. Associated symptoms include rhinorrhea. Pertinent negatives include no chest pain, ear pain, fever, shortness of breath or wheezing. Associated symptoms comments: Sinus pressure, cough, congestion, runny nose. Treatments tried: Mucinex, Tylenol, Vit C, Zinc. The treatment provided mild relief.   Pt did go for COVID testing this morning. Patient with congestion coughing some body aches denies high fever chills sweats Virtual Visit via Telephone Note  I connected with [name] on 05/14/19 at  4:10 PM EDT by telephone and verified that I am speaking with the correct person using two identifiers.  Location: Patient: home Provider: office   I discussed the limitations, risks, security and privacy concerns of performing an evaluation and management service by telephone and the availability of in person appointments. I also discussed with the patient that there may be a patient responsible charge related to this service. The patient expressed understanding and agreed to proceed.   History of Present Illness:    Observations/Objective:   Assessment and Plan:   Follow Up Instructions:    I discussed the assessment and treatment plan with the patient. The patient was provided an opportunity to ask questions and all were answered. The patient agreed with the plan and demonstrated an understanding of the instructions.   The patient was advised to call back or seek an in-person evaluation if the symptoms worsen or if the condition fails to improve as anticipated.  I provided 15 minutes of non-face-to-face time during this encounter.   Vicente Males, LPN    Review of Systems  Constitutional: Negative for activity change and fever.  HENT: Positive for congestion and rhinorrhea. Negative for  ear pain.   Eyes: Negative for discharge.  Respiratory: Positive for cough. Negative for shortness of breath and wheezing.   Cardiovascular: Negative for chest pain.       Objective:   Physical Exam Vitals signs and nursing note reviewed.  Constitutional:      Appearance: She is well-developed.  HENT:     Head: Normocephalic.     Nose: Nose normal.     Mouth/Throat:     Pharynx: No oropharyngeal exudate.  Neck:     Musculoskeletal: Neck supple.  Cardiovascular:     Rate and Rhythm: Normal rate.     Heart sounds: Normal heart sounds. No murmur.  Pulmonary:     Effort: Pulmonary effort is normal.     Breath sounds: Normal breath sounds. No wheezing.  Lymphadenopathy:     Cervical: No cervical adenopathy.  Skin:    General: Skin is warm and dry.           Assessment & Plan:  Patient was seen today for upper respiratory illness. It is felt that the patient is dealing with sinusitis.  Antibiotics were prescribed today. Importance of compliance with medication was discussed.  Symptoms should gradually resolve over the course of the next several days. If high fevers, progressive illness, difficulty breathing, worsening condition or failure for symptoms to improve over the next several days then the patient is to follow-up.  If any emergent conditions the patient is to follow-up in the emergency department otherwise to follow-up in the office.  Cannot rule out the possibility of Covid Patient Got Covid testing earlier today Self-isolation recommended await test results

## 2019-05-15 LAB — NOVEL CORONAVIRUS, NAA: SARS-CoV-2, NAA: NOT DETECTED

## 2019-05-16 ENCOUNTER — Encounter: Payer: Self-pay | Admitting: Family Medicine

## 2019-07-06 ENCOUNTER — Other Ambulatory Visit: Payer: Self-pay

## 2019-07-06 ENCOUNTER — Other Ambulatory Visit (INDEPENDENT_AMBULATORY_CARE_PROVIDER_SITE_OTHER): Payer: BC Managed Care – PPO

## 2019-07-06 DIAGNOSIS — Z23 Encounter for immunization: Secondary | ICD-10-CM | POA: Diagnosis not present

## 2019-08-20 ENCOUNTER — Telehealth: Payer: Self-pay | Admitting: Family Medicine

## 2019-08-20 ENCOUNTER — Telehealth: Payer: Self-pay | Admitting: *Deleted

## 2019-08-20 NOTE — Telephone Encounter (Signed)
Patient is requesting refill on klonopin 0.5 mg she was last seen 02/19/19 for anxiety but needing something now for the weekend feeling on edge. She made phone visit appointment  For 08/24/19.Please advise [employer]

## 2019-08-20 NOTE — Telephone Encounter (Signed)
Please advise. Thank you

## 2019-08-20 NOTE — Telephone Encounter (Signed)
Patient states she has been experiencing extreme anxiety.  Denies SI but would like to talk with someone about getting back on her mediation.  I advised her to call her PCP as they had been managing this.  She verbalized understanding and stated she would.  I talked the patient for about 25 minutes about tips to reduce anxiety and social support.  She states she does have a good support group but feels as though she would benefit from taking medication too.  After our conversation, patient stated she felt better after talking but would still call her PCP. Advised to call us back if she needed anything else.  Pt verbalized understanding.

## 2019-08-21 ENCOUNTER — Other Ambulatory Visit: Payer: Self-pay | Admitting: Nurse Practitioner

## 2019-08-21 ENCOUNTER — Encounter: Payer: Self-pay | Admitting: Nurse Practitioner

## 2019-08-21 MED ORDER — CLONAZEPAM 0.5 MG PO TABS: ORAL_TABLET | ORAL | 0 refills | Status: DC

## 2019-08-21 NOTE — Telephone Encounter (Signed)
Done

## 2019-08-24 ENCOUNTER — Other Ambulatory Visit: Payer: Self-pay

## 2019-08-24 ENCOUNTER — Ambulatory Visit: Payer: BC Managed Care – PPO | Admitting: Family Medicine

## 2019-08-24 ENCOUNTER — Ambulatory Visit (INDEPENDENT_AMBULATORY_CARE_PROVIDER_SITE_OTHER): Payer: BC Managed Care – PPO | Admitting: Family Medicine

## 2019-08-24 DIAGNOSIS — R5382 Chronic fatigue, unspecified: Secondary | ICD-10-CM

## 2019-08-24 DIAGNOSIS — F411 Generalized anxiety disorder: Secondary | ICD-10-CM | POA: Insufficient documentation

## 2019-08-24 NOTE — Progress Notes (Signed)
   Subjective:    Patient ID: Marissa Lane, female    DOB: 1991/02/02, 29 y.o.   MRN: [id number]  HPI Pt is having anxiety related issues. Pt also have some OCD tendacies. Pt states she has dealt with anxiety for a while, but feels it has become worse over the last couple of months. Pt is losing weight by having loss of appetite and being nauseous when she does eat. She weight 243 at Christmas and is now down to 223.  GAD 7 : Generalized Anxiety Score 08/24/2019 02/19/2019 04/18/2017 03/22/2017  Nervous, Anxious, on Edge 3 3 1 2   Control/stop worrying 3 3 1 2   Worry too much - different things 3 3 2 2   Trouble relaxing 3 3 1 2   Restless 3 2 1 3   Easily annoyed or irritable 1 3 1 1   Afraid - awful might happen 3 1 1 1   Total GAD 7 Score 19 18 8 13   Anxiety Difficulty Very difficult Very difficult Not difficult at all Somewhat difficult     Office Visit from 08/24/2019 in [place]  PHQ-9 Total Score  22       Virtual Visit via Telephone Note  I connected with Marissa Lane on 08/24/19 at  9:30 AM EST by telephone and verified that I am speaking with the correct person using two identifiers.  Location: Patient: home Provider: office   I discussed the limitations, risks, security and privacy concerns of performing an evaluation and management service by telephone and the availability of in person appointments. I also discussed with the patient that there may be a patient responsible charge related to this service. The patient expressed understanding and agreed to proceed.   History of Present Illness:    Observations/Objective:   Assessment and Plan:   Follow Up Instructions:    I discussed the assessment and treatment plan with the patient. The patient was provided an opportunity to ask questions and all were answered. The patient agreed with the plan and demonstrated an understanding of the instructions.   The patient was advised to call back or seek an  in-person evaluation if the symptoms worsen or if the condition fails to improve as anticipated.  I provided 23 minutes of non-face-to-face time during this encounter.       Review of Systems  Constitutional: Positive for fatigue. Negative for activity change and appetite change.  HENT: Negative for congestion and rhinorrhea.   Respiratory: Negative for cough and shortness of breath.   Cardiovascular: Negative for chest pain and leg swelling.  Gastrointestinal: Negative for abdominal pain and diarrhea.  Endocrine: Negative for polydipsia and polyphagia.  Skin: Negative for color change.  Neurological: Negative for dizziness and weakness.  Psychiatric/Behavioral: Negative for behavioral problems and confusion. The patient is nervous/anxious.        Objective:   Physical Exam   Today's visit was via telephone Physical exam was not possible for this visit      Assessment & Plan:  GAD Panic attacks Moderate depression Not suicidal Recommend referral to psychiatry for further evaluation and counseling Patient has reinitiated her Lexapro 10 mg daily she will give [place] an update in 3 to 4 weeks if it is not dramatically helping we will bump this to 20 mg May use clonazepam as when necessary for panic attacks and anxiety do not overuse Patient was warned that if she becomes suicidal she needs to reach out for help with [place] or others immediately

## 2019-08-25 ENCOUNTER — Encounter: Payer: Self-pay | Admitting: Family Medicine

## 2019-09-14 ENCOUNTER — Other Ambulatory Visit: Payer: Self-pay

## 2019-09-14 ENCOUNTER — Ambulatory Visit (INDEPENDENT_AMBULATORY_CARE_PROVIDER_SITE_OTHER): Payer: BC Managed Care – PPO | Admitting: Family Medicine

## 2019-09-14 ENCOUNTER — Encounter: Payer: Self-pay | Admitting: Family Medicine

## 2019-09-14 DIAGNOSIS — F411 Generalized anxiety disorder: Secondary | ICD-10-CM | POA: Diagnosis not present

## 2019-09-14 MED ORDER — ESCITALOPRAM OXALATE 10 MG PO TABS: 10.0000 mg | ORAL_TABLET | Freq: Every day | ORAL | 4 refills | Status: DC

## 2019-09-14 NOTE — Progress Notes (Signed)
   Subjective:    Patient ID: Marissa Lane, female    DOB: [birth year], 29 y.o.   MRN: [id number]  HPI Pt here for follow up on anxiety. Pt is taking Lexapro 10 mg daily. Pt states her mood and all has increased. Pt states she has only taken 1/2 tab of Klonopin one time.  Patient with some underlying anxiety related issues.  She denies any major troubles currently she states that she feels she is doing better with the medication but is about 80% improved denies any major setbacks recently.  Does still have some panic attacks but not severe Virtual Visit via Telephone Note  I connected with Marissa Lane on 09/14/19 at  9:30 AM EST by telephone and verified that I am speaking with the correct person using two identifiers.  Location: Patient: home Provider: office never hate this Dr. Nicki Reaper over the office today there how are you today doing well thanks so how you feel things are progressing for you are coming along in regards to how you doing feeling spirits okay front okay right I could what about as far as any depression symptoms with this it starts feeling sad down blue   I discussed the limitations, risks, security and privacy concerns of performing an evaluation and management service by telephone and the availability of in person appointments. I also discussed with the patient that there may be a patient responsible charge related to this service. The patient expressed understanding and agreed to proceed.   History of Present Illness:    Observations/Objective:   Assessment and Plan:   Follow Up Instructions:    I discussed the assessment and treatment plan with the patient. The patient was provided an opportunity to ask questions and all were answered. The patient agreed with the plan and demonstrated an understanding of the instructions.   The patient was advised to call back or seek an in-person evaluation if the symptoms worsen or if the condition fails to improve as  anticipated.  I provided 20 minutes of non-face-to-face time during this encounter.       Review of Systems  Constitutional: Negative for activity change and appetite change.  HENT: Negative for congestion and rhinorrhea.   Respiratory: Negative for cough and shortness of breath.   Cardiovascular: Negative for chest pain and leg swelling.  Gastrointestinal: Negative for abdominal pain, nausea and vomiting.  Skin: Negative for color change.  Neurological: Negative for dizziness and weakness.  Psychiatric/Behavioral: Negative for agitation and confusion. The patient is nervous/anxious.        Objective:   Physical Exam Today's visit was via telephone Physical exam was not possible for this visit        Assessment & Plan:  Denies being depressed currently States that she feels she is seeing improvement with her anxiety medicine She will continue this current dose and she will give [place] feedback in approximately 2 to 3 weeks how is doing Possibly will need to go up to 20 mg Follow-up in 4 to 6 months sooner if any problems Use Klonopin rarely only at home when necessary
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
## 2019-09-28 ENCOUNTER — Other Ambulatory Visit: Payer: Self-pay

## 2019-09-28 ENCOUNTER — Ambulatory Visit (INDEPENDENT_AMBULATORY_CARE_PROVIDER_SITE_OTHER): Payer: BC Managed Care – PPO | Admitting: Psychiatry

## 2019-09-28 ENCOUNTER — Ambulatory Visit (HOSPITAL_COMMUNITY): Payer: Self-pay | Admitting: Psychiatry

## 2019-09-28 ENCOUNTER — Encounter (HOSPITAL_COMMUNITY): Payer: Self-pay | Admitting: Psychiatry

## 2019-09-28 DIAGNOSIS — F411 Generalized anxiety disorder: Secondary | ICD-10-CM | POA: Diagnosis not present

## 2019-09-28 MED ORDER — ESCITALOPRAM OXALATE 20 MG PO TABS: 20.0000 mg | ORAL_TABLET | Freq: Every day | ORAL | 0 refills | Status: DC

## 2019-09-28 NOTE — Patient Instructions (Signed)
1. Increase lexapro 20 mg daily  2. Referral to therapy 3. Next appointment: 4/13 at 3:30

## 2019-10-19 ENCOUNTER — Ambulatory Visit (INDEPENDENT_AMBULATORY_CARE_PROVIDER_SITE_OTHER): Payer: Self-pay | Admitting: Psychiatry

## 2019-10-19 ENCOUNTER — Encounter (HOSPITAL_COMMUNITY): Payer: Self-pay | Admitting: Psychiatry

## 2019-10-19 ENCOUNTER — Other Ambulatory Visit: Payer: Self-pay

## 2019-10-19 DIAGNOSIS — F411 Generalized anxiety disorder: Secondary | ICD-10-CM
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
## 2019-11-02 ENCOUNTER — Ambulatory Visit (HOSPITAL_COMMUNITY): Payer: BC Managed Care – PPO | Admitting: Psychiatry

## 2019-11-04 ENCOUNTER — Ambulatory Visit (HOSPITAL_COMMUNITY): Payer: BC Managed Care – PPO | Admitting: Psychiatry

## 2019-11-04 ENCOUNTER — Other Ambulatory Visit: Payer: Self-pay

## 2019-11-23 ENCOUNTER — Telehealth (HOSPITAL_COMMUNITY): Payer: Self-pay | Admitting: Psychiatry

## 2019-12-07 ENCOUNTER — Other Ambulatory Visit: Payer: Self-pay

## 2019-12-07 ENCOUNTER — Ambulatory Visit (HOSPITAL_COMMUNITY): Payer: Self-pay | Admitting: Psychiatry

## 2019-12-07 ENCOUNTER — Telehealth (HOSPITAL_COMMUNITY): Payer: Self-pay | Admitting: Psychiatry

## 2019-12-07 NOTE — Telephone Encounter (Signed)
Therapist attempted to contact patient twice via text through caregility platform, no response.  Therapist called patient and left message indicating attempt and requesting patient call office 

## 2019-12-21 ENCOUNTER — Ambulatory Visit (HOSPITAL_COMMUNITY): Payer: Self-pay | Admitting: Psychiatry

## 2019-12-21 ENCOUNTER — Other Ambulatory Visit: Payer: Self-pay

## 2020-04-18 ENCOUNTER — Other Ambulatory Visit: Payer: Self-pay

## 2020-04-18 ENCOUNTER — Telehealth (INDEPENDENT_AMBULATORY_CARE_PROVIDER_SITE_OTHER): Payer: Commercial Managed Care - PPO | Admitting: Family Medicine

## 2020-04-18 ENCOUNTER — Telehealth: Payer: Self-pay | Admitting: Family Medicine

## 2020-04-18 DIAGNOSIS — R05 Cough: Secondary | ICD-10-CM | POA: Diagnosis not present

## 2020-04-18 DIAGNOSIS — R059 Cough, unspecified: Secondary | ICD-10-CM

## 2020-04-18 MED ORDER — AMOXICILLIN 500 MG PO TABS: 500.0000 mg | ORAL_TABLET | Freq: Three times a day (TID) | ORAL | 1 refills | Status: DC

## 2020-04-18 MED ORDER — PREDNISONE 20 MG PO TABS: ORAL_TABLET | ORAL | 0 refills | Status: DC

## 2020-04-18 MED ORDER — ALBUTEROL SULFATE HFA 108 (90 BASE) MCG/ACT IN AERS: 2.0000 | INHALATION_SPRAY | Freq: Four times a day (QID) | RESPIRATORY_TRACT | 2 refills | Status: DC | PRN

## 2020-04-18 NOTE — Progress Notes (Signed)
   Subjective:    Patient ID: Marissa Lane, female    DOB: July 05, 1991, 29 y.o.   MRN: [id number]  HPI Pt having cough and congestion. Started a few days ago. Started in head and moved to chest. Cough productive at times. Has tried mucinex. Significant head congestion drainage coughing no wheezing or difficulty breathing denies high fever if not clear around others Virtual Visit via Telephone Note  I connected with Marissa Lane on 04/18/20 at  3:50 PM EDT by telephone and verified that I am speaking with the correct person using two identifiers.  Location: Patient: home Provider: office   I discussed the limitations, risks, security and privacy concerns of performing an evaluation and management service by telephone and the availability of in person appointments. I also discussed with the patient that there may be a patient responsible charge related to this service. The patient expressed understanding and agreed to proceed.   History of Present Illness:    Observations/Objective:   Assessment and Plan:   Follow Up Instructions:    I discussed the assessment and treatment plan with the patient. The patient was provided an opportunity to ask questions and all were answered. The patient agreed with the plan and demonstrated an understanding of the instructions.   The patient was advised to call back or seek an in-person evaluation if the symptoms worsen or if the condition fails to improve as anticipated.  I provided 20 including chart review and documentation minutes of non-face-to-face time during this encounter.       Review of Systems See above please    Objective:   Physical Exam  Today's visit was20 via telephone Physical exam was not possible for this visit No breathing difficulties on today's discussion warning signs discussed in detail      Assessment & Plan:  Viral syndrome Possible sinusitis Antibiotic sent in Reactive airway albuterol as needed Short  course prednisone Covid test recommended patient will come by to get this tested

## 2020-04-18 NOTE — Telephone Encounter (Signed)
[name], Marissa Lane are scheduled for a virtual visit with your provider today.    Just as we do with appointments in the office, we must obtain your consent to participate.  Your consent will be active for this visit and any virtual visit you may have with one of our providers in the next 365 days.    If you have a MyChart account, I can also send a copy of this consent to you electronically.  All virtual visits are billed to your insurance company just like a traditional visit in the office.  As this is a virtual visit, video technology does not allow for your provider to perform a traditional examination.  This may limit your provider's ability to fully assess your condition.  If your provider identifies any concerns that need to be evaluated in person or the need to arrange testing such as labs, EKG, etc, we will make arrangements to do so.    Although advances in technology are sophisticated, we cannot ensure that it will always work on either your end or our end.  If the connection with a video visit is poor, we may have to switch to a telephone visit.  With either a video or telephone visit, we are not always able to ensure that we have a secure connection.   I need to obtain your verbal consent now.   Are you willing to proceed with your visit today?   Jessa Stinson has provided verbal consent on 04/18/2020 for a virtual visit (video or telephone).   Vicente Males, LPN 12/24/5407  8:11 PM

## 2020-04-20 LAB — SARS-COV-2, NAA 2 DAY TAT

## 2020-04-20 LAB — NOVEL CORONAVIRUS, NAA: SARS-CoV-2, NAA: NOT DETECTED

## 2020-05-18 ENCOUNTER — Other Ambulatory Visit: Payer: Self-pay

## 2020-06-19 ENCOUNTER — Telehealth (INDEPENDENT_AMBULATORY_CARE_PROVIDER_SITE_OTHER): Payer: Commercial Managed Care - PPO | Admitting: Psychiatry

## 2020-06-19 ENCOUNTER — Other Ambulatory Visit: Payer: Self-pay

## 2020-06-19 ENCOUNTER — Encounter: Payer: Self-pay | Admitting: Psychiatry

## 2020-06-19 DIAGNOSIS — F411 Generalized anxiety disorder: Secondary | ICD-10-CM
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
## 2020-06-28 ENCOUNTER — Other Ambulatory Visit: Payer: Self-pay | Admitting: Women's Health

## 2020-07-04 ENCOUNTER — Other Ambulatory Visit: Payer: Self-pay

## 2020-07-04 ENCOUNTER — Ambulatory Visit (INDEPENDENT_AMBULATORY_CARE_PROVIDER_SITE_OTHER): Payer: Commercial Managed Care - PPO | Admitting: Psychiatry

## 2020-07-04 DIAGNOSIS — F411 Generalized anxiety disorder: Secondary | ICD-10-CM | POA: Diagnosis not present
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
## 2020-07-24 ENCOUNTER — Encounter: Payer: Commercial Managed Care - PPO | Admitting: Psychiatry

## 2020-07-24 ENCOUNTER — Other Ambulatory Visit: Payer: Self-pay

## 2020-07-24 ENCOUNTER — Telehealth (HOSPITAL_COMMUNITY): Payer: Self-pay | Admitting: *Deleted

## 2020-07-24 ENCOUNTER — Telehealth: Payer: Self-pay | Admitting: Psychiatry

## 2020-07-24 ENCOUNTER — Ambulatory Visit (HOSPITAL_COMMUNITY): Payer: Commercial Managed Care - PPO | Admitting: Psychiatry

## 2020-07-24 NOTE — Telephone Encounter (Signed)
Sent link for video visit through Epic. Patient did not sign in. Called the patient for appointment scheduled today. The patient did not answer the phone. No option to leave a voice message.  

## 2020-07-24 NOTE — Telephone Encounter (Signed)
Called patient with the number on file and was not able to reach her. Staff could not leave message due to voicemail box being full. Staff wanted to inform patient that due to weather, provider will not be able to do session today.

## 2020-07-25 NOTE — Progress Notes (Signed)
This encounter was created in error - please disregard.

## 2020-07-31 ENCOUNTER — Encounter: Payer: Self-pay | Admitting: Family Medicine

## 2020-08-08 ENCOUNTER — Other Ambulatory Visit: Payer: Commercial Managed Care - PPO

## 2020-08-11 ENCOUNTER — Other Ambulatory Visit: Payer: Self-pay | Admitting: Women's Health

## 2020-08-21 ENCOUNTER — Other Ambulatory Visit: Payer: Self-pay

## 2020-08-21 ENCOUNTER — Encounter: Payer: Self-pay | Admitting: Women's Health

## 2020-08-21 ENCOUNTER — Ambulatory Visit (INDEPENDENT_AMBULATORY_CARE_PROVIDER_SITE_OTHER): Payer: Commercial Managed Care - PPO | Admitting: Women's Health

## 2020-08-21 VITALS — BP 134/82 | HR 96 | Ht 64.0 in | Wt 205.4 lb

## 2020-08-21 DIAGNOSIS — Z131 Encounter for screening for diabetes mellitus: Secondary | ICD-10-CM

## 2020-08-21 DIAGNOSIS — R87810 Cervical high risk human papillomavirus (HPV) DNA test positive: Secondary | ICD-10-CM | POA: Diagnosis not present

## 2020-08-21 DIAGNOSIS — Z1329 Encounter for screening for other suspected endocrine disorder: Secondary | ICD-10-CM | POA: Diagnosis not present

## 2020-08-21 DIAGNOSIS — Z01419 Encounter for gynecological examination (general) (routine) without abnormal findings: Secondary | ICD-10-CM | POA: Diagnosis not present

## 2020-08-21 DIAGNOSIS — Z8632 Personal history of gestational diabetes: Secondary | ICD-10-CM

## 2020-08-21 DIAGNOSIS — F17200 Nicotine dependence, unspecified, uncomplicated: Secondary | ICD-10-CM

## 2020-08-21 NOTE — Patient Instructions (Signed)
Laparoscopic Tubal Ligation Laparoscopic tubal ligation is a procedure to close the fallopian tubes. This is done to prevent pregnancy. When the fallopian tubes are closed, the eggs that your ovaries release cannot enter the uterus, and sperm cannot reach the released eggs. You should not have this procedure if you want to get pregnant someday or if you are unsure about having more children. Tell a health care provider about:  Any allergies you have.  All medicines you are taking, including vitamins, herbs, eye drops, creams, and over-the-counter medicines.  Any problems you or family members have had with anesthetic medicines.  Any blood disorders you have.  Any surgeries you have had.  Any medical conditions you have.  Whether you are pregnant or may be pregnant.  Any past pregnancies. What are the risks? Generally, this is a safe procedure. However, problems may occur, including:  Infection.  Bleeding.  Injury to other organs in the abdomen.  Side effects from anesthetic medicines.  Failure of the procedure. This procedure can increase your risk of an ectopic pregnancy. This is a pregnancy in which a fertilized egg attaches to the outside of the uterus. What happens before the procedure? Staying hydrated Follow instructions from your health care provider about hydration, which may include:  Up to 2 hours before the procedure - you may continue to drink clear liquids, such as water, clear fruit juice, black coffee, and plain tea. Eating and drinking restrictions Follow instructions from your health care provider about eating and drinking, which may include:  8 hours before the procedure - stop eating heavy meals or foods, such as meat, fried foods, or fatty foods.  6 hours before the procedure - stop eating light meals or foods, such as toast or cereal.  6 hours before the procedure - stop drinking milk or drinks that contain milk.  2 hours before the procedure - stop  drinking clear liquids. Medicines Ask your health care provider about:  Changing or stopping your regular medicines. This is especially important if you are taking diabetes medicines or blood thinners.  Taking medicines such as aspirin and ibuprofen. These medicines can thin your blood. Do not take these medicines unless your health care provider tells you to take them.  Taking over-the-counter medicines, vitamins, herbs, and supplements. Surgery safety Ask your health care provider:  How your surgery site will be marked.  What steps will be taken to help prevent infection. These steps may include: ? Removing hair at the surgery site. ? Washing skin with a germ-killing soap. ? Taking antibiotic medicine. General instructions  Do not use any products that contain nicotine or tobacco for at least 4 weeks before the procedure. These products include cigarettes, chewing tobacco, and vaping devices, such as e-cigarettes. If you need help quitting, ask your health care provider.  Plan to have someone take you home from the hospital.  If you will be going home right after the procedure, plan to have a responsible adult care for you for the time you are told. This is important. What happens during the procedure?  An IV will be inserted into one of your veins.  You will be given one or more of the following: ? A medicine to help you relax (sedative). ? A medicine to numb the area (local anesthetic). ? A medicine to make you fall asleep (general anesthetic). ? A medicine that is injected into an area of your body to numb everything below the injection site (regional anesthetic).  Your bladder  may be emptied with a small tube (catheter).  If you have been given a general anesthetic, a tube will be put down your throat to help you breathe.  Two small incisions will be made in your lower abdomen and near your belly button.  Your abdomen will be inflated with a gas. This will let the  surgeon see better and will give the surgeon room to work.  A lighted tube with camera (laparoscope) will be inserted into your abdomen through one of the incisions. Small instruments will be inserted through the other incision.  The fallopian tubes will be tied off, burned (cauterized), or blocked with a clip, ring, or clamp. A small portion in the center of each fallopian tube may be removed.  The gas will be released from the abdomen.  The incisions will be closed with stitches (sutures).  A bandage (dressing) will be placed over the incisions. The procedure may vary among health care providers and hospitals.      What happens after the procedure?  Your blood pressure, heart rate, breathing rate, and blood oxygen level will be monitored until you leave the hospital.  You will be given medicine to help with pain, nausea, and vomiting as needed.  You may have vaginal discharge after the procedure. You may need to wear a sanitary napkin.  If you were given a sedative during the procedure, it can affect you for several hours. Do not drive or operate machinery until your health care provider says that it is safe. Summary  Laparoscopic tubal ligation is a procedure that is done to prevent pregnancy.  You should not have this procedure if you want to get pregnant someday or if you are unsure about having more children.  The procedure is done using a thin, lighted tube (laparoscope) with a camera attached that will be inserted into your abdomen through an incision.  After the procedure you will be given medicine to help with pain, nausea, and vomiting as needed.  Plan to have someone take you home from the hospital. This information is not intended to replace advice given to you by your health care provider. Make sure you discuss any questions you have with your health care provider. Document Revised: 03/24/2020 Document Reviewed: 03/24/2020 Elsevier Patient Education  2021 [employer].

## 2020-08-21 NOTE — Progress Notes (Signed)
WELL-WOMAN EXAMINATION Patient name: Marissa Lane MRN [id number]  Date of birth: Aug 06, 1990 Chief Complaint:   Gynecologic Exam  History of Present Illness:   Grabiela Lane is a 30 y.o. [id number] Caucasian female being seen today for a routine well-woman exam.  Current complaints: thick mucousy d/c and greyish material 1st day of last 3 periods since got 2nd covid vaccine. Denies abnormal discharge, itching/odor/irritation.   H/O PCOS, wants to start metformin in A1C still in pre-diabetes range.   Depression screen [facility] 2/9 08/21/2020 08/24/2019 04/27/2019 04/09/2018 01/08/2018  Decreased Interest 0 3 0 0 0  Down, Depressed, Hopeless 0 3 1 0 0  PHQ - 2 Score 0 6 1 0 0  Altered sleeping 1 3 - 1 -  Tired, decreased energy 1 3 - 1 -  Change in appetite 1 3 - 0 -  Feeling bad or failure about yourself  1 3 - 0 -  Trouble concentrating 1 3 - 0 -  Moving slowly or fidgety/restless 0 1 - 0 -  Suicidal thoughts 0 0 - 0 -  PHQ-9 Score 5 22 - 2 -  Difficult doing work/chores - Very difficult - Not difficult at all -  Some recent data might be hidden     PCP: Wolfgang Phoenix      does desire labs Patient's last menstrual period was 08/14/2020 (exact date). The current method of family planning is coitus interruptus, thinking about BTL Last pap 04/27/19. Results were: NILM w/ HRHPV negative. H/O abnormal pap: yes 2019 ASCUS w/ -HRHPV Last mammogram: never. Results were: N/A. Family h/o breast cancer: no Last colonoscopy: never. Results were: N/A. Family h/o colorectal cancer: yes MGM in 61s Review of Systems:   Pertinent items are noted in HPI Denies any headaches, blurred vision, fatigue, shortness of breath, chest pain, abdominal pain, abnormal vaginal discharge/itching/odor/irritation, problems with periods, bowel movements, urination, or intercourse unless otherwise stated above. Pertinent History Reviewed:  Reviewed past medical,surgical, social and family history.  Reviewed problem list, medications  and allergies. Physical Assessment:   Vitals:   08/21/20 1603  BP: 134/82  Pulse: 96  Weight: 205 lb 6.4 oz (93.2 kg)  Height: 5\' 4"  (1.626 m)  Body mass index is 35.26 kg/m.        Physical Examination:   General appearance - well appearing, and in no distress  Mental status - alert, oriented to person, place, and time  Psych:  She has a normal mood and affect  Skin - warm and dry, normal color, no suspicious lesions noted  Chest - effort normal, all lung fields clear to auscultation bilaterally  Heart - normal rate and regular rhythm  Neck:  midline trachea, no thyromegaly or nodules  Breasts - breasts appear normal, no suspicious masses, no skin or nipple changes or  axillary nodes  Abdomen - soft, nontender, nondistended, no masses or organomegaly  Pelvic - VULVA: normal appearing vulva with no masses, tenderness or lesions  VAGINA: normal appearing vagina with normal color and discharge, no lesions  CERVIX: normal appearing cervix without discharge or lesions, no CMT  Thin prep pap is not done   UTERUS: uterus is felt to be normal size, shape, consistency and nontender   ADNEXA: No adnexal masses or tenderness noted.  Extremities:  No swelling or varicosities noted  Chaperone: Angel Neas    No results found for this or any previous visit (from the past 24 hour(s)).  Assessment & Plan:  1) Well-Woman Exam  2) Abnormal d/c  1st day of period x89mths> after received 2nd covid vaccine. Declines cv swab. To monitor, if changes/new sx let us know  3) Thinking about BTL> gave printed info, if decides for it, schedule appt w/ MD  Labs/procedures today: as below  Mammogram @31yo  or sooner if problems Colonoscopy @30yo  or sooner if problems  Orders Placed This Encounter  Procedures  . CBC  . Comprehensive metabolic panel  . TSH  . Hemoglobin A1c    Meds: No orders of the defined types were placed in this encounter.   Follow-up: Return in about 1 year (around 08/21/2021)  for Pap & physical.  Roma Schanz CNM, [facility] 08/21/2020 4:36 PM

## 2020-09-09 LAB — COMPREHENSIVE METABOLIC PANEL
ALT: 32 IU/L (ref 0–32)
AST: 24 IU/L (ref 0–40)
Albumin/Globulin Ratio: 1.6 (ref 1.2–2.2)
Albumin: 4.4 g/dL (ref 3.9–5.0)
Alkaline Phosphatase: 64 IU/L (ref 44–121)
BUN/Creatinine Ratio: 24 — ABNORMAL HIGH (ref 9–23)
BUN: 16 mg/dL (ref 6–20)
Bilirubin Total: <0.2 mg/dL (ref 0.0–1.2)
CO2: 19 mmol/L — ABNORMAL LOW (ref 20–29)
Calcium: 9.3 mg/dL (ref 8.7–10.2)
Chloride: 103 mmol/L (ref 96–106)
Creatinine, Ser: 0.67 mg/dL (ref 0.57–1.00)
GFR calc Af Amer: 136 mL/min/{1.73_m2} (ref >59)
GFR calc non Af Amer: 118 mL/min/{1.73_m2} (ref >59)
Globulin, Total: 2.8 g/dL (ref 1.5–4.5)
Glucose: 111 mg/dL — ABNORMAL HIGH (ref 65–99)
Potassium: 4.3 mmol/L (ref 3.5–5.2)
Sodium: 139 mmol/L (ref 134–144)
Total Protein: 7.2 g/dL (ref 6.0–8.5)

## 2020-09-09 LAB — CBC
Hematocrit: 41.6 % (ref 34.0–46.6)
Hemoglobin: 13.8 g/dL (ref 11.1–15.9)
MCH: 29.7 pg (ref 26.6–33.0)
MCHC: 33.2 g/dL (ref 31.5–35.7)
MCV: 90 fL (ref 79–97)
Platelets: 324 10*3/uL (ref 150–450)
RBC: 4.64 x10E6/uL (ref 3.77–5.28)
RDW: 12 % (ref 11.7–15.4)
WBC: 9 10*3/uL (ref 3.4–10.8)

## 2020-09-09 LAB — HEMOGLOBIN A1C
Est. average glucose Bld gHb Est-mCnc: 105 mg/dL
Hgb A1c MFr Bld: 5.3 % (ref 4.8–5.6)

## 2020-09-09 LAB — TSH: TSH: 2.12 u[IU]/mL (ref 0.450–4.500)

## 2020-10-23 ENCOUNTER — Encounter: Payer: Self-pay | Admitting: Obstetrics & Gynecology

## 2020-10-23 ENCOUNTER — Other Ambulatory Visit: Payer: Self-pay

## 2020-10-23 ENCOUNTER — Ambulatory Visit: Payer: Commercial Managed Care - PPO | Admitting: Obstetrics & Gynecology

## 2020-10-23 VITALS — BP 139/93 | HR 78 | Ht 64.0 in | Wt 205.5 lb

## 2020-10-23 DIAGNOSIS — Z3040 Encounter for surveillance of contraceptives, unspecified: Secondary | ICD-10-CM

## 2020-10-30 ENCOUNTER — Telehealth: Payer: Self-pay | Admitting: Adult Health

## 2020-10-30 ENCOUNTER — Encounter: Payer: Self-pay | Admitting: *Deleted

## 2020-10-30 NOTE — Telephone Encounter (Signed)
Mail box full @ 12:25 pm. Will send a Mychart message. [place]

## 2020-10-30 NOTE — Telephone Encounter (Signed)
Pt is supposed to start her cycle today or tomorrow, is on schedule for Mirena placement tomorrow 11:00 Can we order meds for her to take early in the morning if she don't start her cycle?  Please advise & notify pt        [employer]

## 2020-10-30 NOTE — Telephone Encounter (Signed)
Has not started period yet, last sex a week ago, will call me in am about period or not, can reschedule IUD insertion

## 2020-10-30 NOTE — Telephone Encounter (Signed)
Pt is requesting Cytotec to help with IUD insertion. Has appt tomorrow at 11:00. Thanks!! [place]

## 2020-10-31 ENCOUNTER — Telehealth: Payer: Self-pay | Admitting: Adult Health

## 2020-10-31 ENCOUNTER — Ambulatory Visit: Payer: Commercial Managed Care - PPO | Admitting: Adult Health

## 2020-10-31 NOTE — Telephone Encounter (Signed)
Spoke with pt. Pt is not on her period. IUD insertion appt cancelled for today. Pt will let us know when she starts her period. [place]

## 2020-10-31 NOTE — Telephone Encounter (Signed)
Patient wants to speak with Derrek Monaco( didn't clarify)

## 2020-11-01 ENCOUNTER — Telehealth: Payer: Self-pay

## 2020-11-01 ENCOUNTER — Telehealth: Payer: Self-pay | Admitting: Adult Health

## 2020-11-01 MED ORDER — MISOPROSTOL 200 MCG PO TABS: ORAL_TABLET | ORAL | 0 refills | Status: DC

## 2020-11-01 NOTE — Telephone Encounter (Signed)
Pt called stated she had a negative preg. Test and is wanting to be scheduled for her IUD placement and that she has began to spot.

## 2020-11-01 NOTE — Telephone Encounter (Signed)
Pt states [place] never got the order for the Cytotec & she's coming tomorrow for IUD insert   Please advise & notify pt

## 2020-11-01 NOTE — Telephone Encounter (Signed)
Rx cytotec,take  prior to IUD insertion

## 2020-11-02 ENCOUNTER — Ambulatory Visit (INDEPENDENT_AMBULATORY_CARE_PROVIDER_SITE_OTHER): Payer: Commercial Managed Care - PPO | Admitting: Adult Health

## 2020-11-02 ENCOUNTER — Other Ambulatory Visit: Payer: Self-pay

## 2020-11-02 ENCOUNTER — Encounter: Payer: Self-pay | Admitting: Adult Health

## 2020-11-02 VITALS — BP 133/88 | HR 92 | Ht 64.0 in | Wt 203.0 lb

## 2020-11-02 DIAGNOSIS — R7989 Other specified abnormal findings of blood chemistry: Secondary | ICD-10-CM | POA: Insufficient documentation

## 2020-11-02 DIAGNOSIS — N83202 Unspecified ovarian cyst, left side: Secondary | ICD-10-CM | POA: Insufficient documentation

## 2020-11-02 DIAGNOSIS — Z30430 Encounter for insertion of intrauterine contraceptive device: Secondary | ICD-10-CM

## 2020-11-02 DIAGNOSIS — Z3202 Encounter for pregnancy test, result negative: Secondary | ICD-10-CM | POA: Insufficient documentation

## 2020-11-02 LAB — POCT URINE PREGNANCY: Preg Test, Ur: NEGATIVE

## 2020-11-02 MED ORDER — LEVONORGESTREL 20 MCG/24HR IU IUD
INTRAUTERINE_SYSTEM | Freq: Once | INTRAUTERINE | Status: AC
Start: 2020-11-02 — End: 2020-11-02

## 2020-11-02 NOTE — Progress Notes (Addendum)
Patient ID: Marissa Lane, female   DOB: [birth year], 30 y.o.   MRN: [id number] Marissa Lane is a 30 y.o. year old  Female, white female, married, G3P3 who presents for placement of a  Mirena IUD. Her LMP was 11/01/20 and her pregnancy test today is negative.  And she had cytotec 400 mcg this am at 5:15 am.  BP 133/88 (BP Location: Right Arm, Patient Position: Sitting, Cuff Size: Large)   Pulse 92   Ht '5\' 4"'  (1.626 m)   Wt 203 lb (92.1 kg)   LMP 11/01/2020   BMI 34.84 kg/m   The risks and benefits of the method and placement have been thouroughly reviewed with the patient and all questions were answered.  Specifically the patient is aware of failure rate of 07/998, expulsion of the IUD and of possible perforation.  The patient is aware of irregular bleeding due to the method and understands the incidence of irregular bleeding diminishes with time.  Consent is signed. Time out was performed.  A Graves speculum was placed.  The cervix was prepped using Betadine. The uterus was found to be midline and I met some resistance and asked [name] to come in, and the uterus sounded to 8-9 cm.  The cervix was grasped with a tenaculum and the IUD was inserted to 8-9 cm.  It was pulled back 1 cm and the IUD was disengaged, by [name] CNM  The strings were trimmed to 3 cm.  Bedside Sonogram was performed by [name], and the proper placement of the IUD was verified.And shows a 5.6 cm cyst left ovary ?dermoid and several smaller cyst, she has had pain on that side at times.  The patient was instructed on signs and symptoms of infection and to check for the strings after each menses or each month.  The patient is to refrain from intercourse for 3 days.  The patient is scheduled for a return appointment in 4 weeks for GYN [place] to check the cyst left ovary and IUD placement and then see me Fall risk is low  [facility] - 11/02/20 0958      Pregnancy Intention Screening   Does the patient want to become pregnant in  the next year? No    Does the patient's partner want to become pregnant in the next year? No    Would the patient like to discuss contraceptive options today? No      Contraception Wrap Up   Current Method Withdrawal or Other Method    End Method IUD or IUS         Examination chaperoned by Levy Pupa LPN Use 3 Advil and 2 ES tylenol for cramping every 6-8 hours prn and push fluids.  Derrek Monaco 11/02/2020 10:35 AM

## 2020-11-02 NOTE — Addendum Note (Signed)
Addended by: Linton Rump on: 11/02/2020 10:51 AM   Modules accepted: Orders

## 2020-11-06 ENCOUNTER — Telehealth (INDEPENDENT_AMBULATORY_CARE_PROVIDER_SITE_OTHER): Payer: Commercial Managed Care - PPO | Admitting: Adult Health

## 2020-11-06 ENCOUNTER — Encounter: Payer: Self-pay | Admitting: Adult Health

## 2020-11-06 ENCOUNTER — Other Ambulatory Visit: Payer: Self-pay

## 2020-11-06 DIAGNOSIS — N83202 Unspecified ovarian cyst, left side: Secondary | ICD-10-CM

## 2020-11-06 NOTE — Progress Notes (Addendum)
Patient ID: Marissa Lane, female   DOB: May 27, 1991, 30 y.o.   MRN: [id number]   TELEHEALTH GYNECOLOGY VISIT ENCOUNTER NOTE  Provider location: [facility] for [facility].  Patient location: Home  I connected with Latressa Harries on 11/06/20 at  9:45 AM EDT by telephone and verified that I am speaking with the correct person using two identifiers. Patient was unable to do MyChart audiovisual encounter due to technical difficulties, she tried several times.    I discussed the limitations, risks, security and privacy concerns of performing an evaluation and management service by telephone and the availability of in person appointments. I also discussed with the patient that there may be a patient responsible charge related to this service. The patient expressed understanding and agreed to proceed.   History:  Marissa Lane is a 30 y.o. [phone] female being evaluated today for to just make sure I was aware that since she has had COVID has had more clots and mucous with period and has seemed to have more constipation and has pain/cramping left side. She had mirena placed 11/02/20 and has done well with that, on the POC [place] for IUD check was when noticed cyst left ovary ?dermoid. She denies any abnormal vaginal discharge or other concerns.       Past Medical History:  Diagnosis Date  . Acne   . Anxiety   . Chest pain   . Chronic hypertension   . Diabetes mellitus without complication ([place])   . Dysthymic disorder    over dose age 69  . GERD (gastroesophageal reflux disease)   . Gestational diabetes    metformin  . H/O urinary frequency 06/25/2013  . Irregular intermenstrual bleeding 10/12/2013  . Obesity   . Palpitations   . PCOS (polycystic ovarian syndrome)   . Postpartum depression   . Tobacco abuse    Past Surgical History:  Procedure Laterality Date  . CHOLECYSTECTOMY  03/06/2012   Procedure: LAPAROSCOPIC CHOLECYSTECTOMY;  Surgeon: Donato Heinz, MD;  Location: [facility]  ORS;  Service: General;  Laterality: N/A;  . ESOPHAGOGASTRODUODENOSCOPY N/A 09/09/2012   Procedure: ESOPHAGOGASTRODUODENOSCOPY (EGD);  Surgeon: Rogene Houston, MD;  Location: [facility] ENDO SUITE;  Service: Endoscopy;  Laterality: N/A;  340  . MOUTH SURGERY    . SKIN GRAFT  2009   gum graft  . TONSILLECTOMY     The following portions of the patient's history were reviewed and updated as appropriate: allergies, current medications, past family history, past medical history, past social history, past surgical history and problem list.   Health Maintenance:  Normal pap and negative HRHPV on 04/27/2019  Review of Systems:  Pertinent items noted in HPI and remainder of comprehensive ROS otherwise negative.  Physical Exam:   General:  Alert, oriented and cooperative.   Mental Status: Normal mood and affect perceived. Normal judgment and thought content.  Physical exam deferred due to nature of the encounter  [facility] - 11/06/20 0254      Pregnancy Intention Screening   Does the patient want to become pregnant in the next year? No    Does the patient's partner want to become pregnant in the next year? No    Would the patient like to discuss contraceptive options today? No      Contraception Wrap Up   Current Method IUD or IUS    End Method IUD or IUS    Contraception Counseling Provided No           Labs and Imaging  Results for orders placed or performed in visit on 11/02/20 (from the past 336 hour(s))  POCT urine pregnancy   Collection Time: 11/02/20  9:49 AM  Result Value Ref Range   Preg Test, Ur Negative Negative   No results found.    Assessment and Plan:     1. Cyst of ovary, left Has [place] appt 12/05/20 to determine if cyst looks like dermoid and has follow up with me then  Keep log of any bleeding and clots        I discussed the assessment and treatment plan with the patient. The patient was provided an opportunity to ask questions and all were answered. The patient agreed  with the plan and demonstrated an understanding of the instructions.   The patient was advised to call back or seek an in-person evaluation/go to the ED if the symptoms worsen or if the condition fails to improve as anticipated.  I provided 9 minutes of non-face-to-face time during this encounter. I wa sin my office at [facility] tree during this encounter  Derrek Monaco, NP [facility] for [place]

## 2020-11-20 ENCOUNTER — Emergency Department (HOSPITAL_COMMUNITY)
Admission: EM | Admit: 2020-11-20 | Discharge: 2020-11-21 | Disposition: A | Discharge status: Home / Self Care | Payer: Commercial Managed Care - PPO | Attending: Emergency Medicine | Admitting: Emergency Medicine

## 2020-11-20 ENCOUNTER — Other Ambulatory Visit: Payer: Self-pay

## 2020-11-20 ENCOUNTER — Emergency Department (HOSPITAL_COMMUNITY): Payer: Commercial Managed Care - PPO

## 2020-11-20 DIAGNOSIS — E119 Type 2 diabetes mellitus without complications: Secondary | ICD-10-CM | POA: Insufficient documentation

## 2020-11-20 DIAGNOSIS — D271 Benign neoplasm of left ovary: Secondary | ICD-10-CM

## 2020-11-20 DIAGNOSIS — F17210 Nicotine dependence, cigarettes, uncomplicated: Secondary | ICD-10-CM | POA: Diagnosis not present

## 2020-11-20 DIAGNOSIS — I10 Essential (primary) hypertension: Secondary | ICD-10-CM | POA: Diagnosis not present

## 2020-11-20 DIAGNOSIS — K219 Gastro-esophageal reflux disease without esophagitis: Secondary | ICD-10-CM | POA: Insufficient documentation

## 2020-11-20 DIAGNOSIS — R1032 Left lower quadrant pain: Secondary | ICD-10-CM | POA: Insufficient documentation

## 2020-11-20 DIAGNOSIS — D270 Benign neoplasm of right ovary: Secondary | ICD-10-CM

## 2020-11-20 LAB — URINALYSIS, ROUTINE W REFLEX MICROSCOPIC
Bilirubin Urine: NEGATIVE
Glucose, UA: NEGATIVE mg/dL
Hgb urine dipstick: NEGATIVE
Ketones, ur: NEGATIVE mg/dL
Leukocytes,Ua: NEGATIVE
Nitrite: NEGATIVE
Protein, ur: NEGATIVE mg/dL
Specific Gravity, Urine: 1.012 (ref 1.005–1.030)
pH: 7 (ref 5.0–8.0)

## 2020-11-20 LAB — CBC WITH DIFFERENTIAL/PLATELET
Abs Immature Granulocytes: 0.03 10*3/uL (ref 0.00–0.07)
Basophils Absolute: 0 10*3/uL (ref 0.0–0.1)
Basophils Relative: 0 %
Eosinophils Absolute: 0.3 10*3/uL (ref 0.0–0.5)
Eosinophils Relative: 3 %
HCT: 40.4 % (ref 36.0–46.0)
Hemoglobin: 14 g/dL (ref 12.0–15.0)
Immature Granulocytes: 0 %
Lymphocytes Relative: 35 %
Lymphs Abs: 3.7 10*3/uL (ref 0.7–4.0)
MCH: 31.6 pg (ref 26.0–34.0)
MCHC: 34.7 g/dL (ref 30.0–36.0)
MCV: 91.2 fL (ref 80.0–100.0)
Monocytes Absolute: 0.7 10*3/uL (ref 0.1–1.0)
Monocytes Relative: 6 %
Neutro Abs: 5.6 10*3/uL (ref 1.7–7.7)
Neutrophils Relative %: 56 %
Platelets: 293 10*3/uL (ref 150–400)
RBC: 4.43 MIL/uL (ref 3.87–5.11)
RDW: 12 % (ref 11.5–15.5)
WBC: 10.4 10*3/uL (ref 4.0–10.5)
nRBC: 0 % (ref 0.0–0.2)

## 2020-11-20 LAB — COMPREHENSIVE METABOLIC PANEL
ALT: 30 U/L (ref 0–44)
AST: 23 U/L (ref 15–41)
Albumin: 3.9 g/dL (ref 3.5–5.0)
Alkaline Phosphatase: 49 U/L (ref 38–126)
Anion gap: 6 (ref 5–15)
BUN: 15 mg/dL (ref 6–20)
CO2: 25 mmol/L (ref 22–32)
Calcium: 9 mg/dL (ref 8.9–10.3)
Chloride: 106 mmol/L (ref 98–111)
Creatinine, Ser: 0.45 mg/dL (ref 0.44–1.00)
GFR, Estimated: >60 mL/min (ref >60)
Glucose, Bld: 95 mg/dL (ref 70–99)
Potassium: 3.9 mmol/L (ref 3.5–5.1)
Sodium: 137 mmol/L (ref 135–145)
Total Bilirubin: 0.4 mg/dL (ref 0.3–1.2)
Total Protein: 7 g/dL (ref 6.5–8.1)

## 2020-11-20 LAB — LIPASE, BLOOD: Lipase: 33 U/L (ref 11–51)

## 2020-11-20 LAB — HCG, QUANTITATIVE, PREGNANCY: hCG, Beta Chain, Quant, S: 1 m[IU]/mL (ref <5)

## 2020-11-20 MED ORDER — SODIUM CHLORIDE 0.9 % IV BOLUS
1000.0000 mL | Freq: Once | INTRAVENOUS | Status: AC
  Administered 2020-11-21: 1000 mL via INTRAVENOUS

## 2020-11-20 NOTE — ED Triage Notes (Signed)
Pt c/o lower back and LLQ pain.  Has cyst (5.6cm on left ovary) and called on call OBGYN provider and was told to come to ER to make sure she doesn't have any ovarian torsion.  Just started having discomfort after having sex on Thursday.  +N since yesterday

## 2020-11-20 NOTE — ED Provider Notes (Signed)
[facility] EMERGENCY DEPARTMENT Provider Note   CSN: [id number] Arrival date & time: 11/20/20  2020     History Chief Complaint  Patient presents with  . Abdominal Pain    Marissa Lane is a 30 y.o. female.  Patient is a 30 year old female with a past medical history of ovarian cysts, anxiety, hypertension, diabetes.  Patient presenting for evaluation of left lower quadrant pain.  This has been present for several weeks, but became somewhat worse today.  She spoke to her OB GYN and was sent here for ultrasound to rule out torsion.  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  She denies fevers or chills.  She denies any bowel or bladder complaints.  The history is provided by the patient.  Abdominal Pain Pain location:  LLQ Pain quality: cramping   Pain radiates to:  Does not radiate Pain severity:  Moderate Onset quality:  Gradual Timing:  Constant Progression:  Worsening Chronicity:  New Relieved by:  Nothing Worsened by:  Movement and palpation      Past Medical History:  Diagnosis Date  . Acne   . Anxiety   . Chest pain   . Chronic hypertension   . Diabetes mellitus without complication ([place])   . Dysthymic disorder    over dose age 20  . GERD (gastroesophageal reflux disease)   . Gestational diabetes    metformin  . H/O urinary frequency 06/25/2013  . Irregular intermenstrual bleeding 10/12/2013  . Obesity   . Palpitations   . PCOS (polycystic ovarian syndrome)   . Postpartum depression   . Tobacco abuse     Patient Active Problem List   Diagnosis Date Noted  . Cyst of ovary, left 11/02/2020  . Encounter for IUD insertion 11/02/2020  . Pregnancy examination or test, negative result 11/02/2020  . GAD (generalized anxiety disorder) 08/24/2019  . Attention deficit hyperactivity disorder (ADHD), 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  . Cervical high risk HPV (human papillomavirus) test  positive 01/09/2017  . History of gestational diabetes 07/19/2016  . Chronic hypertension 04/30/2016  . Smoker 03/05/2016  . Migraine without aura and with status migrainosus, not intractable 04/06/2015  . Anxiety 09/21/2013  . PCOS (polycystic ovarian syndrome) 12/11/2012  . Erosive esophagitis 11/05/2012  . Morbid obesity ([place]) 09/14/2010    Past Surgical History:  Procedure Laterality Date  . CHOLECYSTECTOMY  03/06/2012   Procedure: LAPAROSCOPIC CHOLECYSTECTOMY;  Surgeon: Donato Heinz, MD;  Location: [facility] ORS;  Service: General;  Laterality: N/A;  . ESOPHAGOGASTRODUODENOSCOPY N/A 09/09/2012   Procedure: ESOPHAGOGASTRODUODENOSCOPY (EGD);  Surgeon: Rogene Houston, MD;  Location: [facility] ENDO SUITE;  Service: Endoscopy;  Laterality: N/A;  340  . MOUTH SURGERY    . SKIN GRAFT  2009   gum graft  . TONSILLECTOMY       OB History    Gravida  3   Para  3   Term  3   Preterm      AB      Living  3     SAB      IAB      Ectopic      Multiple  0   Live Births  3           Family History  Adopted: Yes  Problem Relation Age of Onset  . Congestive Heart Failure Mother   . Irritable bowel syndrome Mother   . Ulcers Mother   .  Anemia Mother   . Drug abuse Mother   . Hypertension Brother   . Colon cancer Maternal Grandmother 12  . Anxiety disorder Maternal Grandmother   . Heart disease Maternal Grandfather 70       MI    Social History   Tobacco Use  . Smoking status: Current Every Day Smoker    Packs/day: 0.50    Years: 10.00    Pack years: 5.00    Types: Cigarettes  . Smokeless tobacco: Never Used  Vaping Use  . Vaping Use: Never used  Substance Use Topics  . Alcohol use: No  . Drug use: No    Home Medications Prior to Admission medications   Medication Sig Start Date End Date Taking? Authorizing Provider  Acetaminophen (TYLENOL PO) Take by mouth as needed.    [provider]  clonazePAM (KLONOPIN) 0.5 MG tablet Take 1/2 tab po BID prn  severe anxiety Patient not taking: Reported on 11/06/2020 08/21/19   Nilda Simmer, NP  levonorgestrel (MIRENA) 20 MCG/24HR IUD 1 each by Intrauterine route once.    [provider]    Allergies    Bupropion, Cymbalta [duloxetine hcl], Silicone, Vicodin [hydrocodone-acetaminophen], and Zofran [ondansetron hcl]  Review of Systems   Review of Systems  Gastrointestinal: Positive for abdominal pain.  All other systems reviewed and are negative.   Physical Exam Updated Vital Signs BP (!) 144/82 (BP Location: Right Arm)   Pulse 87   Temp 99.1 F (37.3 C) (Oral)   Resp 16   Ht 5\' 4"  (1.626 m)   Wt 92.1 kg   LMP 11/01/2020   SpO2 98%   BMI 34.84 kg/m   Physical Exam Vitals and nursing note reviewed.  Constitutional:      General: She is not in acute distress.    Appearance: She is well-developed. She is not diaphoretic.  HENT:     Head: Normocephalic and atraumatic.  Cardiovascular:     Rate and Rhythm: Normal rate and regular rhythm.     Heart sounds: No murmur heard. No friction rub. No gallop.   Pulmonary:     Effort: Pulmonary effort is normal. No respiratory distress.     Breath sounds: Normal breath sounds. No wheezing.  Abdominal:     General: Bowel sounds are normal. There is no distension.     Palpations: Abdomen is soft.     Tenderness: There is abdominal tenderness in the left lower quadrant. There is no right CVA tenderness, left CVA tenderness, guarding or rebound.     Comments: There is mild to moderate tenderness in the left lower quadrant.  Musculoskeletal:        General: Normal range of motion.     Cervical back: Normal range of motion and neck supple.  Skin:    General: Skin is warm and dry.  Neurological:     Mental Status: She is alert and oriented to person, place, and time.     ED Results / Procedures / Treatments   Labs (all labs ordered are listed, but only abnormal results are displayed) Labs Reviewed  URINALYSIS, ROUTINE W  REFLEX MICROSCOPIC - Abnormal; Notable for the following components:      Result Value   APPearance HAZY (*)    All other components within normal limits  COMPREHENSIVE METABOLIC PANEL  LIPASE, BLOOD  HCG, QUANTITATIVE, PREGNANCY  CBC WITH DIFFERENTIAL/PLATELET  CBC WITH DIFFERENTIAL/PLATELET    EKG None  Radiology No results found.  Procedures Procedures  Medications Ordered in ED Medications - No data to display  ED Course  I have reviewed the triage vital signs and the nursing notes.  Pertinent labs & imaging results that were available during my care of the patient were reviewed by me and considered in my medical decision making (see chart for details).    MDM Rules/Calculators/A&P  Patient is a 30 year old female referred here by her GYN for left lower quadrant pain.  She has history of dermoid cyst of the left ovary and there was concern of torsion.  Patient is nontoxic-appearing and does not appear in significant discomfort.  She is ambulatory and freely mobile without any limitations.  Her vital signs are stable with no tachycardia or hypertension.  Ultrasound obtained shows nonvisualization of the left ovary, but does show a large simple right ovarian cyst.  As the left ovary was unable to be visualized, I elected to obtain a CAT scan to further evaluate this.  She was found to have what appear to be bilateral dermoid cysts.  According to the radiologist, there is no edema or inflammation surrounding the ovary itself.  She is not having any right-sided discomfort, but there is a 6.4 x 11.2 cm lesion noted.  I feel as though given the clinical picture, patient is not experiencing a torsion and can safely follow-up as an outpatient with her GYN.  I have advised her to call them in the morning to make these arrangements.  She has declined pain medication.  Final Clinical Impression(s) / ED Diagnoses Final diagnoses:  None    Rx / DC Orders ED Discharge Orders     None       Veryl Speak, MD 11/21/20 [phone]

## 2020-11-20 NOTE — ED Notes (Signed)
Pt returned from US at this time.

## 2020-11-20 NOTE — ED Notes (Signed)
Pt transported to ultrasound at this time 

## 2020-11-20 NOTE — ED Triage Notes (Signed)
Emergency Medicine Provider Triage Evaluation Note  Marissa Lane , a 30 y.o. female  was evaluated in triage.  Pt complains of intermittent left sided pelvic pain for the past 2-3 months. Pt reports she had an incidental finding of a left ovarian cyst last month after having an IUD placed. She did not think much of it at that time and has been dealing with the pain without medications. This afternoon her son laid on her right side and "jarred" her causing worsening pain and nausea on the right but also left side. She called her OBGYN and was advised to come to the ED for further evaluation and torsion rule out. She denies fevers, chills, vomiting, diarrhea, vaginal discharge, or any other associated symptoms. LNMP 04/11.  Review of Systems  Positive: + left pelvic pain Negative: - vomiting, fevers  Physical Exam  BP (!) 144/82 (BP Location: Right Arm)   Pulse 87   Temp 99.1 F (37.3 C) (Oral)   Resp 16   Ht 5\' 4"  (1.626 m)   Wt 92.1 kg   LMP 11/01/2020   SpO2 98%   BMI 34.84 kg/m  Gen:   Awake, no distress   HEENT:  Atraumatic  Resp:  Normal effort  Cardiac:  Normal rate  Abd:   Nondistended, + left inguinal pain as well as RLQ TTP MSK:   Moves extremities without difficulty  Neuro:  Speech clear   Medical Decision Making  Medically screening exam initiated at 9:52 PM.  Appropriate orders placed.  Nichola Cieslinski was informed that the remainder of the evaluation will be completed by another provider, this initial triage assessment does not replace that evaluation, and the importance of remaining in the ED until their evaluation is complete.  Clinical Impression  30 year old female presenting for torsion rule out. On arrival temp slightly elevated 99.1. Remainder of VSS. + RLQ and left inguinal TTP. Will order ultrasound at this time. Will also initiate labs given RLQ pain. If ultrasound negative may require CT scan. She is stable for the waiting room at this time.    Eustaquio Maize,  PA-C 11/20/20 2157

## 2020-11-21 ENCOUNTER — Telehealth: Payer: Self-pay | Admitting: Adult Health

## 2020-11-21 ENCOUNTER — Emergency Department (HOSPITAL_COMMUNITY): Payer: Commercial Managed Care - PPO

## 2020-11-21 ENCOUNTER — Telehealth: Payer: Self-pay

## 2020-11-21 MED ORDER — IOHEXOL 300 MG/ML  SOLN
100.0000 mL | Freq: Once | INTRAMUSCULAR | Status: AC | PRN
  Administered 2020-11-21: 100 mL via INTRAVENOUS

## 2020-11-21 NOTE — Telephone Encounter (Signed)
Please advise. Thank you

## 2020-11-21 NOTE — Telephone Encounter (Signed)
Wanted to make Dr Nicki Reaper aware of her going to the ER at [facility] last night and wants him to read the results.  Pt call back [phone]

## 2020-11-21 NOTE — ED Notes (Signed)
Pt ambulated to bathroom without difficulty. Gait steady. No c/o increased pain.

## 2020-11-21 NOTE — Telephone Encounter (Signed)
ED Follow up Visit-Patient wants to know if she can be seen today. Patient stated that she was in the Hospital last night for left lower quadrant pain. Clinical staff will review and advise.

## 2020-11-21 NOTE — Discharge Instructions (Addendum)
Follow-up with your GYN in the morning to arrange an expedited follow-up appointment to discuss the results of your ultrasound/CAT scan.  Return to the emergency department if you develop high fever, severe pain, or other new and concerning symptoms.

## 2020-11-21 NOTE — ED Notes (Signed)
Pt returned from CT at this time.  

## 2020-11-22 ENCOUNTER — Other Ambulatory Visit: Payer: Self-pay

## 2020-11-22 ENCOUNTER — Telehealth: Payer: Self-pay | Admitting: Family Medicine

## 2020-11-22 ENCOUNTER — Ambulatory Visit (INDEPENDENT_AMBULATORY_CARE_PROVIDER_SITE_OTHER): Payer: Commercial Managed Care - PPO | Admitting: Obstetrics & Gynecology

## 2020-11-22 ENCOUNTER — Encounter: Payer: Self-pay | Admitting: Obstetrics & Gynecology

## 2020-11-22 ENCOUNTER — Telehealth: Payer: Self-pay | Admitting: *Deleted

## 2020-11-22 ENCOUNTER — Ambulatory Visit: Payer: Commercial Managed Care - PPO | Admitting: Family Medicine

## 2020-11-22 ENCOUNTER — Encounter: Payer: Self-pay | Admitting: *Deleted

## 2020-11-22 VITALS — BP 142/88 | HR 91 | Ht 64.0 in | Wt 202.8 lb

## 2020-11-22 DIAGNOSIS — N83202 Unspecified ovarian cyst, left side: Secondary | ICD-10-CM | POA: Diagnosis not present

## 2020-11-22 DIAGNOSIS — N9489 Other specified conditions associated with female genital organs and menstrual cycle: Secondary | ICD-10-CM

## 2020-11-22 DIAGNOSIS — N83201 Unspecified ovarian cyst, right side: Secondary | ICD-10-CM | POA: Diagnosis not present

## 2020-11-22 NOTE — Telephone Encounter (Signed)
Pt contacted and verbalized understanding. Pt would like to come into office this afternoon. Placed pt on schedule for 4:10 today.

## 2020-11-22 NOTE — Progress Notes (Signed)
GYN VISIT Patient name: Marissa Lane MRN [id number]  Date of birth: 14-Aug-1990 Chief Complaint:   No chief complaint on file.  History of Present Illness:   Marissa Lane is a 30 y.o. [phone]  female being seen today for follow up regarding:  -Ovarian cyst:  In review of Epic- during Mirena placement on 4/14- there was a questionable ovarian cyst suspicious for a dermoid.  [place] was not scheduled until later in May.    Pt started to note worsening pelvic pain along with nausea after intercourse.  She then went to the ER.  Work up at that time showed:   [place] 11/20/2020: 8.3cm uterus- no fibroids or masses.  IUD in place.  Right ovary with 6.3x6.7x6.4cm anechoic structure- suggestive of simple cyst.  Left ovary not seen  CT: Reproductive: Anteverted uterus with radiopaque IUD in position.  There are 2 macroscopic fat containing lesions in the region of the left adnexa contiguous with the left gonadal vein with a separate more intermediate attenuation cystic appearing structure as well measuring up to 5.5 cm in size (2/61). No significant twisting of the gonadal vein or surrounding inflammation is seen at the level of this lesion.  There is however some a hazy stranding and fluid tracking along the right gonadal veins with venous congestion noted, contiguous with a heterogeneously attenuating bilobed masslike focus measuring 6.4 x 11.2 cm in size in the right adnexa though extends across midline and anterior to the uterus. This likely corresponds to the cystic finding on comparison ultrasound with solid components on that exam demonstrating some internal color flow. No uncontained intraperitoneal hemorrhage is seen.  Menses are regular each month; however since Nov. 2021 notes heavier periods and passage of large clots and ?gray tissue.  Previously did not have this.  Also notes constipation and left leg pain.  After Mirena bleeding noted a few days of light bleeding, but has not yet had a  period.  Today she notes only mild pain- typically 3/10.  With activity rates her pain 6/10.  Denies nausea/vomiting currently.   Patient's last menstrual period was 11/01/2020.  Depression screen [facility] 2/9 08/21/2020 08/24/2019 04/27/2019 04/09/2018 01/08/2018  Decreased Interest 0 3 0 0 0  Down, Depressed, Hopeless 0 3 1 0 0  PHQ - 2 Score 0 6 1 0 0  Altered sleeping 1 3 - 1 -  Tired, decreased energy 1 3 - 1 -  Change in appetite 1 3 - 0 -  Feeling bad or failure about yourself  1 3 - 0 -  Trouble concentrating 1 3 - 0 -  Moving slowly or fidgety/restless 0 1 - 0 -  Suicidal thoughts 0 0 - 0 -  PHQ-9 Score 5 22 - 2 -  Difficult doing work/chores - Very difficult - Not difficult at all -  Some recent data might be hidden     Review of Systems:   Pertinent items are noted in HPI Denies fever/chills, dizziness, headaches, visual disturbances, fatigue, shortness of breath, chest pain, bowel movements, urination, or intercourse unless otherwise stated above.  Pertinent History Reviewed:  Reviewed past medical,surgical, social, obstetrical and family history.  Reviewed problem list, medications and allergies. Physical Assessment:  There were no vitals filed for this visit.There is no height or weight on file to calculate BMI.       Physical Examination:   General appearance: alert, well appearing, and in no distress  Psych: mood appropriate, normal affect  Skin: warm & dry  Cardiovascular: normal heart rate noted  Respiratory: normal respiratory effort, no distress  Abdomen: soft, non-tender, LLQ tenderness  Pelvic: normal external genitalia, vulva, vagina, no cervical motion tenderness, palpable enlarged mobile mass appreciated mostly right to midline  Extremities: no edema   Chaperone: declined    Assessment & Plan:  1) Bilateral ovarian cyst -Long discussion with patient regarding ovarian cyst and current imaging.  Based on current work up it is unclear if it is a left dermoid  and right simple cyst vs bilateral complex cysts. -Discussed exploratory laparoscopy and potential for either cyst removal vs oophorectomy as well as unilateral vs bilateral -Reviewed that while most dermoids are benign there is ~ 5% risk of malignancy and potential for further intervention -Pt would prefer to obtain as much information prior to surgery as possible -Plan for MRI as well as lab work ([place], tumor markers) -Next visit will review MRI and surgical plan   Janyth Pupa, DO Attending [place], [place] for [employer], [place]

## 2020-11-22 NOTE — Telephone Encounter (Signed)
Pt had CT scan done at ER on Monday night. Pt has contacted GYN to get appt but they have not returned her call. Pt feels they are not taking her seriously. Pt would like to speak with provider regarding CT results and possible referral to new GYN. Pt does have appt with GYN on 12/05/20 for follow up from IUD placement. Pt states she is willing to do a phone visit or video visit if need be.  Please advise. Thank you

## 2020-11-22 NOTE — Telephone Encounter (Signed)
In order to adequately cover all of the parameters today office visit would be necessary if the patient would like to move her office visit to a video visit later this afternoon we can do that or if she would like to do in person later this afternoon this can be done

## 2020-11-22 NOTE — Telephone Encounter (Signed)
I reviewed over these results I do recommend a follow-up office visit to discuss

## 2020-11-22 NOTE — Telephone Encounter (Signed)
Transition Care Management Follow-up Telephone Call  Date of discharge and from where: 11/21/2020 - [facility]  How have you been since you were released from the hospital? "Fine"  Any questions or concerns? No  Items Reviewed:  Did the pt receive and understand the discharge instructions provided? Yes   Medications obtained and verified? Yes   Other? No   Any new allergies since your discharge? No   Dietary orders reviewed? No  Do you have support at home? Yes    Functional Questionnaire: (I = Independent and D = Dependent) ADLs: I  Bathing/Dressing- I  Meal Prep- I  Eating- I  Maintaining continence- I  Transferring/Ambulation- I  Managing Meds- I  Follow up appointments reviewed:   PCP Hospital f/u appt confirmed? Yes  Scheduled to see Dr. Wolfgang Phoenix on 11/22/2020 @ 1610.  [facility] f/u appt confirmed? Yes  Scheduled to see OBGYN on 11/22/2020 @ 1010.  Are transportation arrangements needed? No   If their condition worsens, is the pt aware to call PCP or go to the Emergency Dept.? Yes  Was the patient provided with contact information for the PCP's office or ED? Yes  Was to pt encouraged to call back with questions or concerns? Yes

## 2020-11-22 NOTE — Telephone Encounter (Signed)
Pt contacted and will be coming in today at 4:10 to discuss results.

## 2020-11-23 LAB — OVARIAN MALIGNANCY RISK-ROMA
Cancer Antigen (CA) 125: 17.7 U/mL (ref 0.0–38.1)
HE4: 57.7 pmol/L (ref 0.0–61.2)
Postmenopausal ROMA: 1.46
Premenopausal ROMA: 1.03

## 2020-11-23 LAB — AFP TUMOR MARKER: AFP, Serum, Tumor Marker: 2.5 ng/mL (ref 0.0–4.7)

## 2020-11-23 LAB — PREMENOPAUSAL INTERP: LOW

## 2020-11-23 LAB — POSTMENOPAUSAL INTERP: LOW

## 2020-11-27 ENCOUNTER — Telehealth: Payer: Self-pay | Admitting: Family Medicine

## 2020-11-27 ENCOUNTER — Encounter: Payer: Self-pay | Admitting: Family Medicine

## 2020-11-27 ENCOUNTER — Telehealth: Payer: Self-pay | Admitting: Obstetrics & Gynecology

## 2020-11-27 NOTE — Telephone Encounter (Signed)
Pt contacted and verbalized understanding. Pt states 11:20 is perfectly fine.

## 2020-11-27 NOTE — Telephone Encounter (Signed)
Patient States that she would like Dr. Nelda Marseille to write her a letter for her employment to be able to be released for her surgery on 6/02 because she is showing she would have to work that weekend and she has pre op at [employer] stay on 12/22/2020. Please contact pt when letter is ready for pick up.

## 2020-11-27 NOTE — Telephone Encounter (Signed)
I would recommend considering moving that appointment to 11:20 AM in that would be the last appointment of that morning By doing so we should have at least 30 minutes to spend I do have access to her scans as well as visit with the OB/GYN Then the 11:00 can be open for a more simple issue with a different patient thank you  Please run that by Marissa Lane

## 2020-11-27 NOTE — Telephone Encounter (Signed)
Patient has appointment on 5/18 at 11:00 about female issues wanting second opinion on and wanting to discuss issues with swelling in right should going to send you my chart pictures of it explain that you might not be able to address the that issues in a 20 minute visit also might have schedule different appointment. Please advise

## 2020-11-29 ENCOUNTER — Ambulatory Visit: Payer: Commercial Managed Care - PPO | Admitting: Women's Health

## 2020-12-05 ENCOUNTER — Other Ambulatory Visit: Payer: Commercial Managed Care - PPO

## 2020-12-05 ENCOUNTER — Telehealth: Payer: Self-pay

## 2020-12-05 ENCOUNTER — Other Ambulatory Visit: Payer: Self-pay | Admitting: Obstetrics & Gynecology

## 2020-12-05 ENCOUNTER — Ambulatory Visit: Payer: Commercial Managed Care - PPO | Admitting: Adult Health

## 2020-12-05 MED ORDER — ALPRAZOLAM 1 MG PO TABS: ORAL_TABLET | ORAL | 0 refills | Status: DC

## 2020-12-05 NOTE — Telephone Encounter (Signed)
Patient called she is scheduled for a MRI on Thursday 5/19 and is claustrophobic and requesting medication to take before she goes for this MRI ordered by Nelda Marseille

## 2020-12-06 ENCOUNTER — Ambulatory Visit: Payer: Commercial Managed Care - PPO | Admitting: Family Medicine

## 2020-12-06 ENCOUNTER — Other Ambulatory Visit: Payer: Self-pay

## 2020-12-06 ENCOUNTER — Other Ambulatory Visit: Payer: Commercial Managed Care - PPO

## 2020-12-06 ENCOUNTER — Ambulatory Visit: Payer: Commercial Managed Care - PPO | Admitting: Adult Health

## 2020-12-06 VITALS — BP 124/88 | HR 98 | Temp 98.1°F | Ht 64.0 in | Wt 203.0 lb

## 2020-12-06 DIAGNOSIS — N83202 Unspecified ovarian cyst, left side: Secondary | ICD-10-CM

## 2020-12-06 DIAGNOSIS — N83201 Unspecified ovarian cyst, right side: Secondary | ICD-10-CM

## 2020-12-06 DIAGNOSIS — D1779 Benign lipomatous neoplasm of other sites: Secondary | ICD-10-CM | POA: Diagnosis not present

## 2020-12-06 NOTE — Progress Notes (Signed)
   Subjective:    Patient ID: Ruqayya Ventress, female    DOB: 30-Dec-1990, 30 y.o.   MRN: [id number]  HPI Opinion on ovarian cyst and possible lipoma on back  Complex ovarian cyst Been progressive over the past few months with lower pelvic pain Saw gynecology they are planning on doing surgery in June They offered her to see a oncology GYN surgeon to have this removed if she so desired She is now contemplated on that and is leaning toward having that second opinion  Lipoma on her back been bothering her for several weeks could have been there for months  Review of Systems     Objective:   Physical Exam Lipoma noted on the mid back region.  Lungs clear heart regular  CT scan lab work reviewed       Assessment & Plan:  Abnormal cyst I agree that surgery would be indicated.  Patient states she will be talking with GYN to get referral to oncology GYN for their input and evaluation  Patient has MRI coming up we will review further  Lipoma ultrasound indicated hold off on any type of surgery at this time depending on results of ultrasound

## 2020-12-07 ENCOUNTER — Ambulatory Visit (HOSPITAL_COMMUNITY)
Admission: RE | Admit: 2020-12-07 | Discharge: 2020-12-07 | Disposition: A | Discharge status: Home / Self Care | Payer: Commercial Managed Care - PPO | Source: Ambulatory Visit | Attending: Obstetrics & Gynecology | Admitting: Obstetrics & Gynecology

## 2020-12-07 ENCOUNTER — Ambulatory Visit (HOSPITAL_COMMUNITY): Payer: Commercial Managed Care - PPO

## 2020-12-07 DIAGNOSIS — N83202 Unspecified ovarian cyst, left side: Secondary | ICD-10-CM | POA: Insufficient documentation

## 2020-12-07 DIAGNOSIS — N83201 Unspecified ovarian cyst, right side: Secondary | ICD-10-CM

## 2020-12-07 MED ORDER — GADOBUTROL 1 MMOL/ML IV SOLN
9.0000 mL | Freq: Once | INTRAVENOUS | Status: AC | PRN
  Administered 2020-12-07: 9 mL via INTRAVENOUS

## 2020-12-07 NOTE — Addendum Note (Signed)
Addended by: Vicente Males on: 12/07/2020 08:22 AM   Modules accepted: Orders

## 2020-12-08 ENCOUNTER — Encounter: Payer: Self-pay | Admitting: Family Medicine

## 2020-12-08 ENCOUNTER — Telehealth: Payer: Self-pay | Admitting: Obstetrics & Gynecology

## 2020-12-08 NOTE — Progress Notes (Signed)
Sent Loma Sousa a message to see which ultrasound to order and she stated to order [place] Chest. Please advise. Thank you

## 2020-12-08 NOTE — Telephone Encounter (Signed)
Patient would like Dr. Nelda Marseille to call her back. Patient stated that she didn't understand what Dr. Nelda Marseille had explained to her during the first phone call.

## 2020-12-10 NOTE — Progress Notes (Signed)
That sounds fine

## 2020-12-11 ENCOUNTER — Encounter: Payer: Self-pay | Admitting: Gynecologic Oncology

## 2020-12-11 ENCOUNTER — Telehealth: Payer: Self-pay | Admitting: *Deleted

## 2020-12-11 NOTE — Telephone Encounter (Signed)
Spoke with the patient and scheduled a new patient appt for 5/24 at 2:30 pm with Dr Denman George; patient given an arrival time of 2 pm. Patient given the address and phone number for the clinic; along with the policy for mask and visitors

## 2020-12-12 ENCOUNTER — Encounter: Payer: Self-pay | Admitting: Gynecologic Oncology

## 2020-12-12 ENCOUNTER — Encounter: Payer: Commercial Managed Care - PPO | Admitting: Obstetrics & Gynecology

## 2020-12-12 ENCOUNTER — Other Ambulatory Visit: Payer: Self-pay

## 2020-12-12 ENCOUNTER — Encounter: Payer: Self-pay | Admitting: Family Medicine

## 2020-12-12 ENCOUNTER — Inpatient Hospital Stay: Payer: Commercial Managed Care - PPO | Attending: Gynecologic Oncology | Admitting: Gynecologic Oncology

## 2020-12-12 ENCOUNTER — Other Ambulatory Visit: Payer: Self-pay | Admitting: *Deleted

## 2020-12-12 VITALS — BP 122/92 | HR 86 | Temp 97.5°F | Resp 20 | Wt 203.4 lb

## 2020-12-12 DIAGNOSIS — F17210 Nicotine dependence, cigarettes, uncomplicated: Secondary | ICD-10-CM | POA: Insufficient documentation

## 2020-12-12 DIAGNOSIS — N83202 Unspecified ovarian cyst, left side: Secondary | ICD-10-CM | POA: Diagnosis not present

## 2020-12-12 DIAGNOSIS — Z79899 Other long term (current) drug therapy: Secondary | ICD-10-CM | POA: Diagnosis not present

## 2020-12-12 DIAGNOSIS — Z793 Long term (current) use of hormonal contraceptives: Secondary | ICD-10-CM | POA: Diagnosis not present

## 2020-12-12 DIAGNOSIS — F341 Dysthymic disorder: Secondary | ICD-10-CM | POA: Diagnosis not present

## 2020-12-12 DIAGNOSIS — F419 Anxiety disorder, unspecified: Secondary | ICD-10-CM | POA: Insufficient documentation

## 2020-12-12 DIAGNOSIS — E119 Type 2 diabetes mellitus without complications: Secondary | ICD-10-CM | POA: Insufficient documentation

## 2020-12-12 DIAGNOSIS — N83201 Unspecified ovarian cyst, right side: Secondary | ICD-10-CM | POA: Diagnosis present

## 2020-12-12 DIAGNOSIS — K219 Gastro-esophageal reflux disease without esophagitis: Secondary | ICD-10-CM | POA: Insufficient documentation

## 2020-12-12 DIAGNOSIS — I10 Essential (primary) hypertension: Secondary | ICD-10-CM | POA: Insufficient documentation

## 2020-12-12 DIAGNOSIS — N9489 Other specified conditions associated with female genital organs and menstrual cycle: Secondary | ICD-10-CM

## 2020-12-12 MED ORDER — OXYCODONE HCL 5 MG PO TABS: 5.0000 mg | ORAL_TABLET | ORAL | 0 refills | Status: DC | PRN

## 2020-12-12 MED ORDER — IBUPROFEN 800 MG PO TABS: 800.0000 mg | ORAL_TABLET | Freq: Three times a day (TID) | ORAL | 0 refills | Status: DC | PRN

## 2020-12-12 MED ORDER — SENNOSIDES-DOCUSATE SODIUM 8.6-50 MG PO TABS: 2.0000 | ORAL_TABLET | Freq: Every day | ORAL | 0 refills | Status: DC

## 2020-12-12 NOTE — Progress Notes (Signed)
Consult Note: Gyn-Onc  Consult was requested by Dr. Nelda Marseille for the evaluation of Marissa Lane 30 y.o. female  CC:  Chief Complaint  Patient presents with  . bilateral ovarian cysts    Assessment/Plan:  Marissa Lane  is a 30 y.o.  year old with bilateral ovarian cyst, left smaller than right with the right appearing somewhat atypical.  The 8 cm left ovarian cyst is most consistent with a dermoid.  These are associated with symptoms of intermittent discomfort.  Am recommending surgical removal with robotic assisted unilateral salpingo-oophorectomy and contralateral ovarian cystectomy.  The patient understands that we may not be able to save towards ovaries depending upon the findings at the time of surgery.  It will be our goal to preserve benign ovarian tissue if that is possible given her young age and her ambivalence regarding future childbearing.  I explained that frozen section will be performed on the ovarian masses and if these reveal malignancy staging will proceed which would include, with her permission, complete hysterectomy with BSO lymphadenectomy and peritoneal biopsies.  I explained surgical risks including  bleeding, infection, damage to internal organs (such as bladder,ureters, bowels), blood clot, reoperation and rehospitalization.  I explained anticipated recovery.  Expect this will be a outpatient surgery.  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   HPI: Marissa Lane is a 30 year old P3 who was seen in consultation at the request of Dr Nelda Marseille for evaluation of bilateral ovarian cysts.  The patient was having some abnormal uterine bleeding that was treated with placement of a Mirena IUD in early May 2022.  2 confirmed the placement of the IUD a transvaginal ultrasound was performed in the office which identified bilateral cystic masses.  The uterus measured 8.3 x 5.5 x 5.6 cm with a 6 mm tract.  Right ovary measured 4.6 x  4.2 x 5.2 cm and a 6.3 x 6.7 x 6.4 cm anechoic structure seen within the right ovary.  There is no abnormal blood flow noted within the ovary.  The left ovary was not visualized but on subsequent MRI of the pelvis on 12/07/2020 the left ovary was visualized is an 8 cm benign appearing dermoid cyst and a separate 11 cm complex cystic and solid masses in the anterior pelvic Likely arising from the right ovary.  This mass has solid components showing contrast-enhancement and no internal fat within the mass.  The patient reported some intermittent pelvic pain preceding discovery of the mass which had lasted for approximately 2 months.  Her medical history is most significant for anxiety.  Her surgical history is most significant for laparoscopic cholecystectomy.  Her gynecologic history is remarkable for 3x SVD.  Her family cancer history is significant for a GM with colon cancer at advanced age.  She works as as a Metallurgist. She lives with her husband and children.   Current Meds:  Outpatient Encounter Medications as of 12/12/2020  Medication Sig  . ALPRAZolam (XANAX) 1 MG tablet Take 1 tablet Pre procedure for anxiety (Patient not taking: Reported on 12/11/2020)  . ibuprofen (ADVIL) 800 MG tablet Take 1 tablet (800 mg total) by mouth every 8 (eight) hours as needed for moderate pain. For AFTER surgery only  . levonorgestrel (MIRENA) 20 MCG/24HR IUD 1 each by Intrauterine route once.  . naproxen sodium (ALEVE) 220 MG tablet Take 220 mg by mouth daily as needed.  Marland Kitchen oxyCODONE (OXY  IR/ROXICODONE) 5 MG immediate release tablet Take 1 tablet (5 mg total) by mouth every 4 (four) hours as needed for severe pain. For AFTER surgery only, do not take and drive  . senna-docusate (SENOKOT-S) 8.6-50 MG tablet Take 2 tablets by mouth at bedtime. For AFTER surgery, do not take if having diarrhea  . Acetaminophen (TYLENOL PO) Take by mouth as needed. (Patient not taking: Reported on 12/11/2020)  . clonazePAM  (KLONOPIN) 0.5 MG tablet Take 1/2 tab po BID prn severe anxiety (Patient not taking: No sig reported)   No facility-administered encounter medications on file as of 12/12/2020.    Allergy:  Allergies  Allergen Reactions  . Bupropion Other (See Comments)    WORSENING DEPRESSION  . Cymbalta [Duloxetine Hcl] Other (See Comments)    Worsening depression  . Silicone     Contacts-eyes itching  . Vicodin [Hydrocodone-Acetaminophen] Other (See Comments)    Bad migraine  . Zofran [Ondansetron Hcl] Itching    Social Hx:   Social History   Socioeconomic History  . Marital status: Married    Spouse name: Carlis Burnsworth  . Number of children: 3  . Years of education: Not on file  . Highest education level: Associate degree: occupational, Hotel manager, or vocational program  Occupational History  . Occupation: LPN  Tobacco Use  . Smoking status: Current Every Day Smoker    Packs/day: 0.50    Years: 10.00    Pack years: 5.00    Types: Cigarettes  . Smokeless tobacco: Never Used  Vaping Use  . Vaping Use: Never used  Substance and Sexual Activity  . Alcohol use: No  . Drug use: No  . Sexual activity: Yes    Birth control/protection: I.U.D.  Other Topics Concern  . Not on file  Social History Narrative  . Not on file   Social Determinants of Health   Financial Resource Strain: Low Risk   . Difficulty of Paying Living Expenses: Not very hard  Food Insecurity: No Food Insecurity  . Worried About Charity fundraiser in the Last Year: Never true  . Ran Out of Food in the Last Year: Never true  Transportation Needs: No Transportation Needs  . Lack of Transportation (Medical): No  . Lack of Transportation (Non-Medical): No  Physical Activity: Inactive  . Days of Exercise per Week: 0 days  . Minutes of Exercise per Session: 0 min  Stress: Stress Concern Present  . Feeling of Stress : Rather much  Social Connections: Moderately Isolated  . Frequency of Communication with Friends  and Family: More than three times a week  . Frequency of Social Gatherings with Friends and Family: More than three times a week  . Attends Religious Services: Never  . Active Member of Clubs or Organizations: No  . Attends Archivist Meetings: Never  . Marital Status: Married  Human resources officer Violence: Not At Risk  . Fear of Current or Ex-Partner: No  . Emotionally Abused: No  . Physically Abused: No  . Sexually Abused: No    Past Surgical Hx:  Past Surgical History:  Procedure Laterality Date  . CHOLECYSTECTOMY  03/06/2012   Procedure: LAPAROSCOPIC CHOLECYSTECTOMY;  Surgeon: Donato Heinz, MD;  Location: [facility];  Service: General;  Laterality: N/A;  . ESOPHAGOGASTRODUODENOSCOPY N/A 09/09/2012   Procedure: ESOPHAGOGASTRODUODENOSCOPY (EGD);  Surgeon: Rogene Houston, MD;  Location: [facility];  Service: Endoscopy;  Laterality: N/A;  340  . MOUTH SURGERY    . SKIN GRAFT  2009  gum graft  . TONSILLECTOMY      Past Medical Hx:  Past Medical History:  Diagnosis Date  . Acne   . Anxiety   . Chest pain   . Chronic hypertension   . Diabetes mellitus without complication ([place])   . Dysthymic disorder    over dose age 33  . GERD (gastroesophageal reflux disease)   . Gestational diabetes    metformin  . H/O urinary frequency 06/25/2013  . Irregular intermenstrual bleeding 10/12/2013  . Lipoma    right scapula  . Obesity   . Palpitations   . PCOS (polycystic ovarian syndrome)   . Postpartum depression   . Tobacco abuse     Past Gynecological History:   No LMP recorded. (Menstrual status: IUD).  Family Hx:  Family History  Adopted: Yes  Problem Relation Age of Onset  . Congestive Heart Failure Mother   . Irritable bowel syndrome Mother   . Ulcers Mother   . Anemia Mother   . Drug abuse Mother   . Hypertension Brother   . Colon cancer Maternal Grandmother 26  . Anxiety disorder Maternal Grandmother   . Heart disease Maternal Grandfather 19       MI     Review of Systems:  Constitutional  Feels well,    ENT Normal appearing ears and nares bilaterally Skin/Breast  No rash, sores, jaundice, itching, dryness Cardiovascular  No chest pain, shortness of breath, or edema  Pulmonary  No cough or wheeze.  Gastro Intestinal  No nausea, vomitting, or diarrhoea. No bright red blood per rectum, no abdominal pain, change in bowel movement, or constipation.  Genito Urinary  No frequency, urgency, dysuria, + pelvic pain Musculo Skeletal  No myalgia, arthralgia, joint swelling or pain  Neurologic  No weakness, numbness, change in gait,  Psychology  No depression, anxiety, insomnia.   Vitals:  Blood pressure (!) 122/92, pulse 86, temperature (!) 97.5 F (36.4 C), temperature source Tympanic, resp. rate 20, weight 203 lb 6.4 oz (92.3 kg), SpO2 100 %.  Physical Exam: WD in NAD Neck  Supple NROM, without any enlargements.  Lymph Node Survey No cervical supraclavicular or inguinal adenopathy Cardiovascular  Pulse normal rate, regularity and rhythm. S1 and S2 normal.  Lungs  Clear to auscultation bilateraly, without wheezes/crackles/rhonchi. Good air movement.  Skin  No rash/lesions/breakdown  Psychiatry  Alert and oriented to person, place, and time  Abdomen  Normoactive bowel sounds, abdomen soft, non-tender and obese without evidence of hernia.  Back No CVA tenderness Genito Urinary  Vulva/vagina: Normal external female genitalia.  No lesions. No discharge or bleeding.  Bladder/urethra:  No lesions or masses, well supported bladder  Vagina: normal  Cervix: Normal appearing, no lesions.  Uterus:  Small, mobile, no parametrial involvement or nodularity.  Adnexa: unable to appreciated masses. Rectal  deferred Extremities  No bilateral cyanosis, clubbing or edema.  60 minutes of total time was spent for this patient encounter, including preparation, face-to-face counseling with the patient and coordination of care, review of  imaging (results and images), communication with the referring provider and documentation of the encounter.   [name], MD  12/12/2020, 4:05 PM

## 2020-12-12 NOTE — Patient Instructions (Addendum)
Preparing for your Surgery  Plan for surgery on December 26, 2020 with Dr. Everitt Amber at [place] will be scheduled for a robotic assisted laparoscopic unilateral salpingo-oophorectomy (removal of one ovary and fallopian tube), ovarian cystectomy (removal of ovarian cyst), possible bilateral salpingo-oophorectomy (removal of both ovaries and fallopian tubes), possible total laparoscopic hysterectomy (removal of the uterus and cervix), possible staging if a cancer is identified.   Pre-operative Testing -You will receive a phone call from presurgical testing at [facility] to arrange for a pre-operative appointment, labs.  -Bring your insurance card, copy of an advanced directive if applicable, medication list  -At that visit, you will be asked to sign a consent for a possible blood transfusion in case a transfusion becomes necessary during surgery.  The need for a blood transfusion is rare but having consent is a necessary part of your care.     -You should not be taking blood thinners or aspirin at least ten days prior to surgery unless instructed by your surgeon.  -Do not take supplements such as fish oil (omega 3), red yeast rice, turmeric before your surgery. You want to avoid medications with aspirin in them including headache powders such as BC or Goody's), Excedrin migraine.  Day Before Surgery at [place] will be asked to take in a light diet the day before surgery. You will be advised you can have clear liquids up until 3 hours before your surgery.    Eat a light diet the day before surgery.  Examples including soups, broths, toast, yogurt, mashed potatoes.  AVOID GAS PRODUCING FOODS. Things to avoid include carbonated beverages (fizzy beverages, sodas), raw fruits and raw vegetables (uncooked), or beans.   If your bowels are filled with gas, your surgeon will have difficulty visualizing your pelvic organs which increases your surgical risks.  Your role in  recovery Your role is to become active as soon as directed by your doctor, while still giving yourself time to heal.  Rest when you feel tired. You will be asked to do the following in order to speed your recovery:  - Cough and breathe deeply. This helps to clear and expand your lungs and can prevent pneumonia after surgery.  - [place]. Do mild physical activity. Walking or moving your legs help your circulation and body functions return to normal. Do not try to get up or walk alone the first time after surgery.   -If you develop swelling on one leg or the other, pain in the back of your leg, redness/warmth in one of your legs, please call the office or go to the Emergency Room to have a doppler to rule out a blood clot. For shortness of breath, chest pain-seek care in the Emergency Room as soon as possible. - Actively manage your pain. Managing your pain lets you move in comfort. We will ask you to rate your pain on a scale of zero to 10. It is your responsibility to tell your doctor or nurse where and how much you hurt so your pain can be treated.  Special Considerations -If you are diabetic, you may be placed on insulin after surgery to have closer control over your blood sugars to promote healing and recovery.  This does not mean that you will be discharged on insulin.  If applicable, your oral antidiabetics will be resumed when you are tolerating a solid diet.  -Your final pathology results from surgery should be available around one  week after surgery and the results will be relayed to you when available.  -FMLA forms can be faxed to [phone] and please allow 5-7 business days for completion.  Pain Management After Surgery -You have been prescribed your pain medication and bowel regimen medications before surgery so that you can have these available when you are discharged from the hospital. The pain medication is for use ONLY AFTER surgery and a new prescription will  not be given.   -Make sure that you have Tylenol and Ibuprofen at home to use on a regular basis after surgery for pain control. We recommend alternating the medications every hour to six hours since they work differently and are processed in the body differently for pain relief.  -Review the attached handout on narcotic use and their risks and side effects.   Bowel Regimen -You have been prescribed Sennakot-S to take nightly to prevent constipation especially if you are taking the narcotic pain medication intermittently.  It is important to prevent constipation and drink adequate amounts of liquids. You can stop taking this medication when you are not taking pain medication and you are back on your normal bowel routine.  Risks of Surgery Risks of surgery are low but include bleeding, infection, damage to surrounding structures, re-operation, blood clots, and very rarely death.   Blood Transfusion Information (For the consent to be signed before surgery)  We will be checking your blood type before surgery so in case of emergencies, we will know what type of blood you would need.                                            WHAT IS A BLOOD TRANSFUSION?  A transfusion is the replacement of blood or some of its parts. Blood is made up of multiple cells which provide different functions.  Red blood cells carry oxygen and are used for blood loss replacement.  White blood cells fight against infection.  Platelets control bleeding.  Plasma helps clot blood.  Other blood products are available for specialized needs, such as hemophilia or other clotting disorders. BEFORE THE TRANSFUSION  Who gives blood for transfusions?   You may be able to donate blood to be used at a later date on yourself (autologous donation).  Relatives can be asked to donate blood. This is generally not any safer than if you have received blood from a stranger. The same precautions are taken to ensure safety when a  relative's blood is donated.  Healthy volunteers who are fully evaluated to make sure their blood is safe. This is blood bank blood. Transfusion therapy is the safest it has ever been in the practice of medicine. Before blood is taken from a donor, a complete history is taken to make sure that person has no history of diseases nor engages in risky social behavior (examples are intravenous drug use or sexual activity with multiple partners). The donor's travel history is screened to minimize risk of transmitting infections, such as malaria. The donated blood is tested for signs of infectious diseases, such as HIV and hepatitis. The blood is then tested to be sure it is compatible with you in order to minimize the chance of a transfusion reaction. If you or a relative donates blood, this is often done in anticipation of surgery and is not appropriate for emergency situations. It takes many days to process the  donated blood. RISKS AND COMPLICATIONS Although transfusion therapy is very safe and saves many lives, the main dangers of transfusion include:   Getting an infectious disease.  Developing a transfusion reaction. This is an allergic reaction to something in the blood you were given. Every precaution is taken to prevent this. The decision to have a blood transfusion has been considered carefully by your caregiver before blood is given. Blood is not given unless the benefits outweigh the risks.  AFTER SURGERY INSTRUCTIONS  Return to work: 4 weeks if applicable  Activity: 1. Be up and out of the bed during the day.  Take a nap if needed.  You may walk up steps but be careful and use the hand rail.  Stair climbing will tire you more than you think, you may need to stop part way and rest.   2. No lifting or straining for 6 weeks over 10 pounds. No pushing, pulling, straining for 6 weeks.  3. No driving for 1 week(s).  Do not drive if you are taking narcotic pain medicine and make sure that your  reaction time has returned.   4. You can shower as soon as the next day after surgery. Shower daily.  Use your regular soap and water (not directly on the incision) and pat your incision(s) dry afterwards; don't rub.  No tub baths or submerging your body in water until cleared by your surgeon. If you have the soap that was given to you by pre-surgical testing that was used before surgery, you do not need to use it afterwards because this can irritate your incisions.   5. No sexual activity and nothing in the vagina for 8 weeks IF YOU HAVE A HYSTERECTOMY.  6. You may experience a small amount of clear drainage from your incisions, which is normal.  If the drainage persists, increases, or changes color please call the office.  7. Do not use creams, lotions, or ointments such as neosporin on your incisions after surgery until advised by your surgeon because they can cause removal of the dermabond glue on your incisions.    8. You may experience vaginal spotting after surgery or around the 6-8 week mark from surgery when the stitches at the top of the vagina begin to dissolve.  The spotting is normal but if you experience heavy bleeding, call our office.  9. Take Tylenol or ibuprofen first for pain and only use narcotic pain medication for severe pain not relieved by the Tylenol or Ibuprofen.  Monitor your Tylenol intake to a max of 4,000 mg in a 24 hour period. You can alternate these medications after surgery.  Diet: 1. Low sodium Heart Healthy Diet is recommended but you are cleared to resume your normal (before surgery) diet after your procedure.  2. It is safe to use a laxative, such as Miralax or Colace, if you have difficulty moving your bowels. You have been prescribed Sennakot at bedtime every evening to keep bowel movements regular and to prevent constipation.    Wound Care: 1. Keep clean and dry.  Shower daily.  Reasons to call the Doctor:  Fever - Oral temperature greater than 100.4  degrees Fahrenheit  Foul-smelling vaginal discharge  Difficulty urinating  Nausea and vomiting  Increased pain at the site of the incision that is unrelieved with pain medicine.  Difficulty breathing with or without chest pain  New calf pain especially if only on one side  Sudden, continuing increased vaginal bleeding with or without clots.  Contacts: For questions or concerns you should contact:  Dr. Everitt Amber at [phone]  Joylene John, NP at [phone]  After Hours: call [phone] and have the GYN Oncologist paged/contacted (after 5 pm or on the weekends).  Messages sent via mychart are for non-urgent matters and are not responded to after hours so for urgent needs, please call the after hours number.

## 2020-12-12 NOTE — H&P (View-Only) (Signed)
Consult Note: Gyn-Onc  Consult was requested by Dr. Nelda Marseille for the evaluation of Marissa Lane 30 y.o. female  CC:  Chief Complaint  Patient presents with  . bilateral ovarian cysts    Assessment/Plan:  [name]  is a 30 y.o.  year old with bilateral ovarian cyst, left smaller than right with the right appearing somewhat atypical.  The 8 cm left ovarian cyst is most consistent with a dermoid.  These are associated with symptoms of intermittent discomfort.  Am recommending surgical removal with robotic assisted unilateral salpingo-oophorectomy and contralateral ovarian cystectomy.  The patient understands that we may not be able to save towards ovaries depending upon the findings at the time of surgery.  It will be our goal to preserve benign ovarian tissue if that is possible given her young age and her ambivalence regarding future childbearing.  I explained that frozen section will be performed on the ovarian masses and if these reveal malignancy staging will proceed which would include, with her permission, complete hysterectomy with BSO lymphadenectomy and peritoneal biopsies.  I explained surgical risks including  bleeding, infection, damage to internal organs (such as bladder,ureters, bowels), blood clot, reoperation and rehospitalization.  I explained anticipated recovery.  Expect this will be a outpatient surgery.  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   HPI: Ms  Marissa Lane is a 30 year old P3 who was seen in consultation at the request of Dr Nelda Marseille for evaluation of bilateral ovarian cysts.  The patient was having some abnormal uterine bleeding that was treated with placement of a Mirena IUD in early May 2022.  2 confirmed the placement of the IUD a transvaginal ultrasound was performed in the office which identified bilateral cystic masses.  The uterus measured 8.3 x 5.5 x 5.6 cm with a 6 mm tract.  Right ovary measured 4.6 x  4.2 x 5.2 cm and a 6.3 x 6.7 x 6.4 cm anechoic structure seen within the right ovary.  There is no abnormal blood flow noted within the ovary.  The left ovary was not visualized but on subsequent MRI of the pelvis on 12/07/2020 the left ovary was visualized is an 8 cm benign appearing dermoid cyst and a separate 11 cm complex cystic and solid masses in the anterior pelvic Likely arising from the right ovary.  This mass has solid components showing contrast-enhancement and no internal fat within the mass.  The patient reported some intermittent pelvic pain preceding discovery of the mass which had lasted for approximately 2 months.  Her medical history is most significant for anxiety.  Her surgical history is most significant for laparoscopic cholecystectomy.  Her gynecologic history is remarkable for 3x SVD.  Her family cancer history is significant for a GM with colon cancer at advanced age.  She works as as a Metallurgist. She lives with her husband and children.   Current Meds:  Outpatient Encounter Medications as of 12/12/2020  Medication Sig  . ALPRAZolam (XANAX) 1 MG tablet Take 1 tablet Pre procedure for anxiety (Patient not taking: Reported on 12/11/2020)  . ibuprofen (ADVIL) 800 MG tablet Take 1 tablet (800 mg total) by mouth every 8 (eight) hours as needed for moderate pain. For AFTER surgery only  . levonorgestrel (MIRENA) 20 MCG/24HR IUD 1 each by Intrauterine route once.  . naproxen sodium (ALEVE) 220 MG tablet Take 220 mg by mouth daily as needed.  Marland Kitchen oxyCODONE (OXY  IR/ROXICODONE) 5 MG immediate release tablet Take 1 tablet (5 mg total) by mouth every 4 (four) hours as needed for severe pain. For AFTER surgery only, do not take and drive  . senna-docusate (SENOKOT-S) 8.6-50 MG tablet Take 2 tablets by mouth at bedtime. For AFTER surgery, do not take if having diarrhea  . Acetaminophen (TYLENOL PO) Take by mouth as needed. (Patient not taking: Reported on 12/11/2020)  . clonazePAM  (KLONOPIN) 0.5 MG tablet Take 1/2 tab po BID prn severe anxiety (Patient not taking: No sig reported)   No facility-administered encounter medications on file as of 12/12/2020.    Allergy:  Allergies  Allergen Reactions  . Bupropion Other (See Comments)    WORSENING DEPRESSION  . Cymbalta [Duloxetine Hcl] Other (See Comments)    Worsening depression  . Silicone     Contacts-eyes itching  . Vicodin [Hydrocodone-Acetaminophen] Other (See Comments)    Bad migraine  . Zofran [Ondansetron Hcl] Itching    Social Hx:   Social History   Socioeconomic History  . Marital status: Married    Spouse name: Shella Lahman  . Number of children: 3  . Years of education: Not on file  . Highest education level: Associate degree: occupational, Hotel manager, or vocational program  Occupational History  . Occupation: LPN  Tobacco Use  . Smoking status: Current Every Day Smoker    Packs/day: 0.50    Years: 10.00    Pack years: 5.00    Types: Cigarettes  . Smokeless tobacco: Never Used  Vaping Use  . Vaping Use: Never used  Substance and Sexual Activity  . Alcohol use: No  . Drug use: No  . Sexual activity: Yes    Birth control/protection: I.U.D.  Other Topics Concern  . Not on file  Social History Narrative  . Not on file   Social Determinants of Health   Financial Resource Strain: Low Risk   . Difficulty of Paying Living Expenses: Not very hard  Food Insecurity: No Food Insecurity  . Worried About Charity fundraiser in the Last Year: Never true  . Ran Out of Food in the Last Year: Never true  Transportation Needs: No Transportation Needs  . Lack of Transportation (Medical): No  . Lack of Transportation (Non-Medical): No  Physical Activity: Inactive  . Days of Exercise per Week: 0 days  . Minutes of Exercise per Session: 0 min  Stress: Stress Concern Present  . Feeling of Stress : Rather much  Social Connections: Moderately Isolated  . Frequency of Communication with Friends  and Family: More than three times a week  . Frequency of Social Gatherings with Friends and Family: More than three times a week  . Attends Religious Services: Never  . Active Member of Clubs or Organizations: No  . Attends Archivist Meetings: Never  . Marital Status: Married  Human resources officer Violence: Not At Risk  . Fear of Current or Ex-Partner: No  . Emotionally Abused: No  . Physically Abused: No  . Sexually Abused: No    Past Surgical Hx:  Past Surgical History:  Procedure Laterality Date  . CHOLECYSTECTOMY  03/06/2012   Procedure: LAPAROSCOPIC CHOLECYSTECTOMY;  Surgeon: Donato Heinz, MD;  Location: [facility];  Service: General;  Laterality: N/A;  . ESOPHAGOGASTRODUODENOSCOPY N/A 09/09/2012   Procedure: ESOPHAGOGASTRODUODENOSCOPY (EGD);  Surgeon: Rogene Houston, MD;  Location: [facility];  Service: Endoscopy;  Laterality: N/A;  340  . MOUTH SURGERY    . SKIN GRAFT  2009  gum graft  . TONSILLECTOMY      Past Medical Hx:  Past Medical History:  Diagnosis Date  . Acne   . Anxiety   . Chest pain   . Chronic hypertension   . Diabetes mellitus without complication ([place])   . Dysthymic disorder    over dose age 70  . GERD (gastroesophageal reflux disease)   . Gestational diabetes    metformin  . H/O urinary frequency 06/25/2013  . Irregular intermenstrual bleeding 10/12/2013  . Lipoma    right scapula  . Obesity   . Palpitations   . PCOS (polycystic ovarian syndrome)   . Postpartum depression   . Tobacco abuse     Past Gynecological History:   No LMP recorded. (Menstrual status: IUD).  Family Hx:  Family History  Adopted: Yes  Problem Relation Age of Onset  . Congestive Heart Failure Mother   . Irritable bowel syndrome Mother   . Ulcers Mother   . Anemia Mother   . Drug abuse Mother   . Hypertension Brother   . Colon cancer Maternal Grandmother 82  . Anxiety disorder Maternal Grandmother   . Heart disease Maternal Grandfather 15       MI     Review of Systems:  Constitutional  Feels well,    ENT Normal appearing ears and nares bilaterally Skin/Breast  No rash, sores, jaundice, itching, dryness Cardiovascular  No chest pain, shortness of breath, or edema  Pulmonary  No cough or wheeze.  Gastro Intestinal  No nausea, vomitting, or diarrhoea. No bright red blood per rectum, no abdominal pain, change in bowel movement, or constipation.  Genito Urinary  No frequency, urgency, dysuria, + pelvic pain Musculo Skeletal  No myalgia, arthralgia, joint swelling or pain  Neurologic  No weakness, numbness, change in gait,  Psychology  No depression, anxiety, insomnia.   Vitals:  Blood pressure (!) 122/92, pulse 86, temperature (!) 97.5 F (36.4 C), temperature source Tympanic, resp. rate 20, weight 203 lb 6.4 oz (92.3 kg), SpO2 100 %.  Physical Exam: WD in NAD Neck  Supple NROM, without any enlargements.  Lymph Node Survey No cervical supraclavicular or inguinal adenopathy Cardiovascular  Pulse normal rate, regularity and rhythm. S1 and S2 normal.  Lungs  Clear to auscultation bilateraly, without wheezes/crackles/rhonchi. Good air movement.  Skin  No rash/lesions/breakdown  Psychiatry  Alert and oriented to person, place, and time  Abdomen  Normoactive bowel sounds, abdomen soft, non-tender and obese without evidence of hernia.  Back No CVA tenderness Genito Urinary  Vulva/vagina: Normal external female genitalia.  No lesions. No discharge or bleeding.  Bladder/urethra:  No lesions or masses, well supported bladder  Vagina: normal  Cervix: Normal appearing, no lesions.  Uterus:  Small, mobile, no parametrial involvement or nodularity.  Adnexa: unable to appreciated masses. Rectal  deferred Extremities  No bilateral cyanosis, clubbing or edema.  60 minutes of total time was spent for this patient encounter, including preparation, face-to-face counseling with the patient and coordination of care, review of  imaging (results and images), communication with the referring provider and documentation of the encounter.   [name], MD  12/12/2020, 4:05 PM

## 2020-12-14 ENCOUNTER — Telehealth: Payer: Self-pay

## 2020-12-14 ENCOUNTER — Other Ambulatory Visit: Payer: Self-pay | Admitting: Gynecologic Oncology

## 2020-12-14 DIAGNOSIS — N83202 Unspecified ovarian cyst, left side: Secondary | ICD-10-CM

## 2020-12-14 DIAGNOSIS — N83201 Unspecified ovarian cyst, right side: Secondary | ICD-10-CM

## 2020-12-14 NOTE — Telephone Encounter (Signed)
Told Marissa Lane that the OR availability for June 7,2022 has changed due to staffing issues.  The surgery needs to be r/s to 6-2 or 01-02-21. Pt chose 01-02-21. She would like to be out of work for [employer]  beginning 01-01-21 as she works nights and out for 6 weeks post op. Joylene John, NP notified of the above.

## 2020-12-19 ENCOUNTER — Ambulatory Visit (HOSPITAL_COMMUNITY)
Admission: RE | Admit: 2020-12-19 | Discharge: 2020-12-19 | Disposition: A | Discharge status: Home / Self Care | Payer: Commercial Managed Care - PPO | Source: Ambulatory Visit | Attending: Family Medicine | Admitting: Family Medicine

## 2020-12-19 ENCOUNTER — Other Ambulatory Visit: Payer: Self-pay

## 2020-12-19 DIAGNOSIS — D1779 Benign lipomatous neoplasm of other sites: Secondary | ICD-10-CM | POA: Diagnosis present

## 2020-12-20 ENCOUNTER — Other Ambulatory Visit (HOSPITAL_COMMUNITY): Payer: Commercial Managed Care - PPO

## 2020-12-20 NOTE — Patient Instructions (Addendum)
DUE TO COVID-19 ONLY ONE VISITOR IS ALLOWED TO COME WITH YOU AND STAY IN THE WAITING ROOM ONLY DURING PRE OP AND PROCEDURE DAY OF SURGERY. THE 1 VISITOR  MAY VISIT WITH YOU AFTER SURGERY IN YOUR PRIVATE ROOM DURING VISITING HOURS ONLY!  3 days before surgery ware a mask when out in [place]   Your procedure is scheduled on: 01/02/21   Report to [facility] Main  Entrance   Report to admitting at  8:30 AM     Call this number if you have problems the morning of surgery [phone]   Eat a light diet the day before surgery.   Examples including soups, broths, toast, yogurt, mashed potatoes.  Things to avoid include carbonated beverages (fizzy beverages), raw fruits and raw vegetables, or beans.   If your bowels are filled with gas, your surgeon will have difficulty visualizing your pelvic organs which increases your surgical risks.     Remember: Do not eat food after Midnight.                                        You may have clear liquids until 7:30 AM    CLEAR LIQUID DIET   Foods Allowed                                                                     Foods Excluded  Coffee and tea, regular and decaf                             liquids that you cannot  Plain Jell-O any favor except red or purple                                           see through such as: Fruit ices (not with fruit pulp)                                     milk, soups, orange juice  Iced Popsicles                                    All solid food Carbonated beverages, regular and diet                                    Cranberry, grape and apple juices Sports drinks like Gatorade Lightly seasoned clear broth or consume(fat free) Sugar, honey syrup        BRUSH YOUR TEETH MORNING OF SURGERY AND RINSE YOUR MOUTH OUT, NO CHEWING GUM CANDY OR MINTS.     Take these medicines the morning of surgery with A SIP OF WATER: Alprazolam(Xanax) if neeed  You may not have any metal on your body including hair pins and              piercings  Do not wear jewelry, make-up, lotions, powders or perfumes, deodorant             Do not wear nail polish on your fingernails.  Do not shave  48 hours prior to surgery.     Do not bring valuables to the hospital. [place].  Contacts, dentures or bridgework may not be worn into surgery.      Patients discharged the day of surgery will not be allowed to drive home.  IF YOU ARE HAVING SURGERY AND GOING HOME THE SAME DAY, YOU MUST HAVE AN ADULT TO DRIVE YOU HOME AND BE WITH YOU FOR 24 HOURS. YOU MAY GO HOME BY TAXI OR UBER OR ORTHERWISE, BUT AN ADULT MUST ACCOMPANY YOU HOME AND STAY WITH YOU FOR 24 HOURS.   _____________________________________________________________________             [facility] - Preparing for Surgery Before surgery, you can play an important role.  Because skin is not sterile, your skin needs to be as free of germs as possible.  You can reduce the number of germs on your skin by washing with CHG (chlorahexidine gluconate) soap before surgery.  CHG is an antiseptic cleaner which kills germs and bonds with the skin to continue killing germs even after washing. Please DO NOT use if you have an allergy to CHG or antibacterial soaps.  If your skin becomes reddened/irritated stop using the CHG and inform your nurse when you arrive at Short Stay. Do not shave (including legs and underarms) for at least 48 hours prior to the first CHG shower.  Please follow these instructions carefully:  1.  Shower with CHG Soap the night before surgery and the  morning of Surgery.  2.  If you choose to wash your hair, wash your hair first as usual with your  normal  shampoo.  3.  After you shampoo, rinse your hair and body thoroughly to remove the  shampoo.                                        4.  Use CHG as you would any other liquid soap.   You can apply chg directly  to the skin and wash                       Gently with a scrungie or clean washcloth.  5.  Apply the CHG Soap to your body ONLY FROM THE NECK DOWN.   Do not use on face/ open                           Wound or open sores. Avoid contact with eyes, ears mouth and genitals (private parts).                       Wash face,  Genitals (private parts) with your normal soap.             6.  Wash thoroughly, paying special attention to the area where your surgery  will be performed.  7.  Thoroughly rinse your body with warm water from the neck down.  8.  DO NOT shower/wash with your normal soap after using and rinsing off  the CHG Soap.             9.  Pat yourself dry with a clean towel.            10.  Wear clean pajamas.            11.  Place clean sheets on your bed the night of your first shower and do not  sleep with pets. Day of Surgery : Do not apply any lotions/deodorants the morning of surgery.  Please wear clean clothes to the hospital/surgery center.  FAILURE TO FOLLOW THESE INSTRUCTIONS MAY RESULT IN THE CANCELLATION OF YOUR SURGERY PATIENT SIGNATURE_________________________________  NURSE SIGNATURE__________________________________  ________________________________________________________________________

## 2020-12-21 ENCOUNTER — Telehealth: Payer: Self-pay

## 2020-12-21 NOTE — Telephone Encounter (Signed)
Received call from Evelisse this morning. 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 She has surgery scheduled for the 14th and is wondering if she should start her leave early and quarantine a few days prior to her surgery.

## 2020-12-21 NOTE — Telephone Encounter (Signed)
Attempted to call Marissa Lane back. Unable to reach her and her voicemail is full.

## 2020-12-21 NOTE — Telephone Encounter (Signed)
Spoke with Marissa Lane and explained our current recommendations. To wear her mask and continue usual precautions. Informed her to notify us if she develops symptoms. She does not need to quarantine prior to surgery. Patient verbalized understanding, she will call if any questions or concerns.

## 2020-12-22 ENCOUNTER — Other Ambulatory Visit (HOSPITAL_COMMUNITY): Payer: Commercial Managed Care - PPO

## 2020-12-22 ENCOUNTER — Other Ambulatory Visit: Payer: Self-pay | Admitting: Family Medicine

## 2020-12-22 DIAGNOSIS — D1779 Benign lipomatous neoplasm of other sites: Secondary | ICD-10-CM

## 2020-12-26 ENCOUNTER — Ambulatory Visit: Admit: 2020-12-26 | Payer: Commercial Managed Care - PPO | Admitting: Obstetrics & Gynecology

## 2020-12-26 SURGERY — OOPHORECTOMY, LAPAROSCOPIC
Anesthesia: General | Laterality: Bilateral

## 2020-12-27 ENCOUNTER — Other Ambulatory Visit: Payer: Self-pay

## 2020-12-27 ENCOUNTER — Encounter (HOSPITAL_COMMUNITY): Payer: Self-pay

## 2020-12-27 ENCOUNTER — Encounter (HOSPITAL_COMMUNITY)
Admission: RE | Admit: 2020-12-27 | Discharge: 2020-12-27 | Disposition: A | Discharge status: Home / Self Care | Payer: Commercial Managed Care - PPO | Source: Ambulatory Visit | Attending: Gynecologic Oncology | Admitting: Gynecologic Oncology

## 2020-12-27 DIAGNOSIS — Z01818 Encounter for other preprocedural examination: Secondary | ICD-10-CM | POA: Diagnosis present

## 2020-12-27 HISTORY — DX: Dyspnea, unspecified: R06.00

## 2020-12-27 LAB — CBC
HCT: 41.6 % (ref 36.0–46.0)
Hemoglobin: 14.3 g/dL (ref 12.0–15.0)
MCH: 30.9 pg (ref 26.0–34.0)
MCHC: 34.4 g/dL (ref 30.0–36.0)
MCV: 89.8 fL (ref 80.0–100.0)
Platelets: 319 10*3/uL (ref 150–400)
RBC: 4.63 MIL/uL (ref 3.87–5.11)
RDW: 12.2 % (ref 11.5–15.5)
WBC: 7.9 10*3/uL (ref 4.0–10.5)
nRBC: 0 % (ref 0.0–0.2)

## 2020-12-27 LAB — BASIC METABOLIC PANEL
Anion gap: 6 (ref 5–15)
BUN: 16 mg/dL (ref 6–20)
CO2: 24 mmol/L (ref 22–32)
Calcium: 9.1 mg/dL (ref 8.9–10.3)
Chloride: 107 mmol/L (ref 98–111)
Creatinine, Ser: 0.51 mg/dL (ref 0.44–1.00)
GFR, Estimated: >60 mL/min (ref >60)
Glucose, Bld: 110 mg/dL — ABNORMAL HIGH (ref 70–99)
Potassium: 4.1 mmol/L (ref 3.5–5.1)
Sodium: 137 mmol/L (ref 135–145)

## 2020-12-27 NOTE — Progress Notes (Signed)
PCP - Sallee Lange MD  CT abd/pelvis noting lower lungs / epic 11/21/2020 [place] Chest / epic 12/19/2020  EKG epic from today 12/27/2020   Patient states SOB with increased exertion, denies fever,cough and chest pain at PAT appointment  Patient verbalized understanding of instructions that were given to them at the PAT appointment. Patient was also instructed that they will need to review over the PAT instructions again at home before surgery.

## 2020-12-28 ENCOUNTER — Telehealth: Payer: Self-pay | Admitting: Nurse Practitioner

## 2020-12-28 ENCOUNTER — Encounter: Payer: Self-pay | Admitting: Nurse Practitioner

## 2020-12-28 ENCOUNTER — Encounter: Payer: Self-pay | Admitting: Family Medicine

## 2020-12-28 NOTE — Telephone Encounter (Signed)
Pt would like work note to be out from Friday note to Sunday night. 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 (Pt work at [employer]). Pt will also be going to stay with her father and he has a lot of health issues and does not want to risk exposing him.  Pt has also been experiencing more pain in abdomen and is not suppose to be lifting or doing strenuous activity (recommendations from surgeon). Pt is ok with having work note sent through [employer]. Please advise. Thank you.

## 2021-01-01 ENCOUNTER — Telehealth: Payer: Self-pay

## 2021-01-01 NOTE — Telephone Encounter (Signed)
Telephone call to check on pre-operative status.  Patient compliant with pre-operative instructions.  Reinforced NPO after midnight.  No questions or concerns voiced.  Instructed to call for any needs.   

## 2021-01-02 ENCOUNTER — Ambulatory Visit (HOSPITAL_COMMUNITY)
Admission: RE | Admit: 2021-01-02 | Discharge: 2021-01-02 | Disposition: A | Discharge status: Home / Self Care | Payer: Commercial Managed Care - PPO | Source: Ambulatory Visit | Attending: Gynecologic Oncology | Admitting: Gynecologic Oncology

## 2021-01-02 ENCOUNTER — Encounter (HOSPITAL_COMMUNITY): Payer: Self-pay | Admitting: Gynecologic Oncology

## 2021-01-02 ENCOUNTER — Ambulatory Visit (HOSPITAL_COMMUNITY): Payer: Commercial Managed Care - PPO | Admitting: Certified Registered"

## 2021-01-02 ENCOUNTER — Encounter (HOSPITAL_COMMUNITY)
Admission: RE | Disposition: A | Discharge status: Home / Self Care | Payer: Self-pay | Source: Ambulatory Visit | Attending: Gynecologic Oncology

## 2021-01-02 ENCOUNTER — Ambulatory Visit (HOSPITAL_COMMUNITY): Payer: Commercial Managed Care - PPO | Admitting: Emergency Medicine

## 2021-01-02 DIAGNOSIS — F17210 Nicotine dependence, cigarettes, uncomplicated: Secondary | ICD-10-CM | POA: Diagnosis not present

## 2021-01-02 DIAGNOSIS — N83201 Unspecified ovarian cyst, right side: Secondary | ICD-10-CM | POA: Diagnosis not present

## 2021-01-02 DIAGNOSIS — C73 Malignant neoplasm of thyroid gland: Secondary | ICD-10-CM | POA: Insufficient documentation

## 2021-01-02 DIAGNOSIS — Z888 Allergy status to other drugs, medicaments and biological substances status: Secondary | ICD-10-CM | POA: Insufficient documentation

## 2021-01-02 DIAGNOSIS — Z79899 Other long term (current) drug therapy: Secondary | ICD-10-CM | POA: Diagnosis not present

## 2021-01-02 DIAGNOSIS — Z885 Allergy status to narcotic agent status: Secondary | ICD-10-CM | POA: Insufficient documentation

## 2021-01-02 DIAGNOSIS — N83202 Unspecified ovarian cyst, left side: Secondary | ICD-10-CM | POA: Diagnosis present

## 2021-01-02 DIAGNOSIS — D3912 Neoplasm of uncertain behavior of left ovary: Secondary | ICD-10-CM | POA: Insufficient documentation

## 2021-01-02 DIAGNOSIS — C561 Malignant neoplasm of right ovary: Secondary | ICD-10-CM | POA: Diagnosis not present

## 2021-01-02 HISTORY — PX: ROBOTIC ASSISTED SALPINGO OOPHERECTOMY: SHX6082

## 2021-01-02 HISTORY — PX: ROBOTIC ASSISTED LAPAROSCOPIC OVARIAN CYSTECTOMY: SHX6081

## 2021-01-02 LAB — TYPE AND SCREEN
ABO/RH(D): A POS
Antibody Screen: NEGATIVE

## 2021-01-02 LAB — PREGNANCY, URINE: Preg Test, Ur: NEGATIVE

## 2021-01-02 SURGERY — SALPINGO-OOPHORECTOMY, ROBOT-ASSISTED
Anesthesia: General | Laterality: Right

## 2021-01-02 MED ORDER — LIDOCAINE 2% (20 MG/ML) 5 ML SYRINGE
INTRAMUSCULAR | Status: AC
  Filled 2021-01-02: qty 5

## 2021-01-02 MED ORDER — SCOPOLAMINE 1 MG/3DAYS TD PT72
1.0000 | MEDICATED_PATCH | TRANSDERMAL | Status: DC
  Administered 2021-01-02: 1.5 mg via TRANSDERMAL
  Filled 2021-01-02: qty 1

## 2021-01-02 MED ORDER — EPHEDRINE 5 MG/ML INJ
INTRAVENOUS | Status: AC
  Filled 2021-01-02: qty 10

## 2021-01-02 MED ORDER — ROCURONIUM BROMIDE 10 MG/ML (PF) SYRINGE
PREFILLED_SYRINGE | INTRAVENOUS | Status: DC | PRN
  Administered 2021-01-02: 20 mg via INTRAVENOUS
  Administered 2021-01-02: 60 mg via INTRAVENOUS
  Administered 2021-01-02: 10 mg via INTRAVENOUS

## 2021-01-02 MED ORDER — BUPIVACAINE HCL 0.25 % IJ SOLN
INTRAMUSCULAR | Status: AC
  Filled 2021-01-02: qty 1

## 2021-01-02 MED ORDER — ONDANSETRON HCL 4 MG/2ML IJ SOLN
INTRAMUSCULAR | Status: AC
  Filled 2021-01-02: qty 2

## 2021-01-02 MED ORDER — FENTANYL CITRATE (PF) 100 MCG/2ML IJ SOLN
INTRAMUSCULAR | Status: AC
  Filled 2021-01-02: qty 2

## 2021-01-02 MED ORDER — TRAMADOL HCL 50 MG PO TABS: 50.0000 mg | ORAL_TABLET | Freq: Four times a day (QID) | ORAL | Status: DC | PRN

## 2021-01-02 MED ORDER — LACTATED RINGERS IV SOLN: INTRAVENOUS | Status: DC | PRN

## 2021-01-02 MED ORDER — FENTANYL CITRATE (PF) 250 MCG/5ML IJ SOLN
INTRAMUSCULAR | Status: DC | PRN
  Administered 2021-01-02: 25 ug via INTRAVENOUS
  Administered 2021-01-02: 100 ug via INTRAVENOUS
  Administered 2021-01-02: 25 ug via INTRAVENOUS
  Administered 2021-01-02 (×3): 50 ug via INTRAVENOUS

## 2021-01-02 MED ORDER — LACTATED RINGERS IV SOLN: INTRAVENOUS | Status: DC

## 2021-01-02 MED ORDER — DEXAMETHASONE SODIUM PHOSPHATE 4 MG/ML IJ SOLN
4.0000 mg | INTRAMUSCULAR | Status: DC
Start: 2021-01-02 — End: 2021-01-02

## 2021-01-02 MED ORDER — PHENYLEPHRINE 40 MCG/ML (10ML) SYRINGE FOR IV PUSH (FOR BLOOD PRESSURE SUPPORT)
PREFILLED_SYRINGE | INTRAVENOUS | Status: DC | PRN
  Administered 2021-01-02 (×2): 40 ug via INTRAVENOUS

## 2021-01-02 MED ORDER — CHLORHEXIDINE GLUCONATE 0.12 % MT SOLN: 15.0000 mL | Freq: Once | OROMUCOSAL | Status: AC

## 2021-01-02 MED ORDER — PHENYLEPHRINE 40 MCG/ML (10ML) SYRINGE FOR IV PUSH (FOR BLOOD PRESSURE SUPPORT)
PREFILLED_SYRINGE | INTRAVENOUS | Status: AC
  Filled 2021-01-02: qty 10

## 2021-01-02 MED ORDER — ACETAMINOPHEN 500 MG PO TABS
1000.0000 mg | ORAL_TABLET | ORAL | Status: AC
  Administered 2021-01-02: 1000 mg via ORAL
  Filled 2021-01-02: qty 2

## 2021-01-02 MED ORDER — EPHEDRINE SULFATE-NACL 50-0.9 MG/10ML-% IV SOSY
PREFILLED_SYRINGE | INTRAVENOUS | Status: DC | PRN
  Administered 2021-01-02: 10 mg via INTRAVENOUS

## 2021-01-02 MED ORDER — PROPOFOL 10 MG/ML IV BOLUS
INTRAVENOUS | Status: AC
  Filled 2021-01-02: qty 20

## 2021-01-02 MED ORDER — LIDOCAINE 2% (20 MG/ML) 5 ML SYRINGE
INTRAMUSCULAR | Status: DC | PRN
  Administered 2021-01-02: 60 mg via INTRAVENOUS

## 2021-01-02 MED ORDER — CELECOXIB 200 MG PO CAPS
400.0000 mg | ORAL_CAPSULE | ORAL | Status: AC
  Administered 2021-01-02: 400 mg via ORAL
  Filled 2021-01-02: qty 2

## 2021-01-02 MED ORDER — STERILE WATER FOR IRRIGATION IR SOLN
Status: DC | PRN
  Administered 2021-01-02: 1000 mL

## 2021-01-02 MED ORDER — CEFAZOLIN SODIUM-DEXTROSE 2-4 GM/100ML-% IV SOLN
2.0000 g | INTRAVENOUS | Status: AC
  Administered 2021-01-02: 2 g via INTRAVENOUS
  Filled 2021-01-02: qty 100

## 2021-01-02 MED ORDER — MIDAZOLAM HCL 2 MG/2ML IJ SOLN
INTRAMUSCULAR | Status: DC | PRN
  Administered 2021-01-02: 2 mg via INTRAVENOUS

## 2021-01-02 MED ORDER — DIPHENHYDRAMINE HCL 50 MG/ML IJ SOLN
INTRAMUSCULAR | Status: AC
  Filled 2021-01-02: qty 1

## 2021-01-02 MED ORDER — KETAMINE HCL 10 MG/ML IJ SOLN
INTRAMUSCULAR | Status: AC
  Filled 2021-01-02: qty 1

## 2021-01-02 MED ORDER — MEPERIDINE HCL 50 MG/ML IJ SOLN: 6.2500 mg | INTRAMUSCULAR | Status: DC | PRN

## 2021-01-02 MED ORDER — PROMETHAZINE HCL 25 MG/ML IJ SOLN: 6.2500 mg | INTRAMUSCULAR | Status: DC | PRN

## 2021-01-02 MED ORDER — OXYCODONE HCL 5 MG PO TABS
ORAL_TABLET | ORAL | Status: AC
  Administered 2021-01-02: 5 mg via ORAL
  Filled 2021-01-02: qty 1

## 2021-01-02 MED ORDER — LIDOCAINE 20MG/ML (2%) 15 ML SYRINGE OPTIME
INTRAMUSCULAR | Status: DC | PRN
  Administered 2021-01-02: 1.5 mg/kg/h via INTRAVENOUS

## 2021-01-02 MED ORDER — DEXAMETHASONE SODIUM PHOSPHATE 10 MG/ML IJ SOLN
INTRAMUSCULAR | Status: DC | PRN
  Administered 2021-01-02: 4 mg via INTRAVENOUS

## 2021-01-02 MED ORDER — ROCURONIUM BROMIDE 10 MG/ML (PF) SYRINGE
PREFILLED_SYRINGE | INTRAVENOUS | Status: AC
  Filled 2021-01-02: qty 10

## 2021-01-02 MED ORDER — BUPIVACAINE HCL 0.25 % IJ SOLN
INTRAMUSCULAR | Status: DC | PRN
  Administered 2021-01-02: 20 mL

## 2021-01-02 MED ORDER — SODIUM CHLORIDE 0.9% FLUSH
3.0000 mL | Freq: Two times a day (BID) | INTRAVENOUS | Status: DC
Start: 2021-01-02 — End: 2021-01-02

## 2021-01-02 MED ORDER — MIDAZOLAM HCL 2 MG/2ML IJ SOLN
0.5000 mg | Freq: Once | INTRAMUSCULAR | Status: DC | PRN
Start: 2021-01-02 — End: 2021-01-02

## 2021-01-02 MED ORDER — OXYCODONE HCL 5 MG/5ML PO SOLN: 5.0000 mg | Freq: Once | ORAL | Status: AC | PRN

## 2021-01-02 MED ORDER — MIDAZOLAM HCL 2 MG/2ML IJ SOLN
INTRAMUSCULAR | Status: AC
  Filled 2021-01-02: qty 2

## 2021-01-02 MED ORDER — OXYCODONE HCL 5 MG PO TABS: 5.0000 mg | ORAL_TABLET | Freq: Once | ORAL | Status: AC | PRN

## 2021-01-02 MED ORDER — DIPHENHYDRAMINE HCL 50 MG/ML IJ SOLN
INTRAMUSCULAR | Status: DC | PRN
  Administered 2021-01-02: 6.25 mg via INTRAVENOUS

## 2021-01-02 MED ORDER — HYDROMORPHONE HCL 1 MG/ML IJ SOLN
INTRAMUSCULAR | Status: AC
  Administered 2021-01-02: 0.5 mg via INTRAVENOUS
  Filled 2021-01-02: qty 1

## 2021-01-02 MED ORDER — HYDROMORPHONE HCL 1 MG/ML IJ SOLN
0.2500 mg | INTRAMUSCULAR | Status: DC | PRN
  Administered 2021-01-02: 0.5 mg via INTRAVENOUS

## 2021-01-02 MED ORDER — LIDOCAINE 2% (20 MG/ML) 5 ML SYRINGE
INTRAMUSCULAR | Status: AC
  Filled 2021-01-02: qty 10

## 2021-01-02 MED ORDER — SUGAMMADEX SODIUM 200 MG/2ML IV SOLN
INTRAVENOUS | Status: DC | PRN
  Administered 2021-01-02: 200 mg via INTRAVENOUS

## 2021-01-02 MED ORDER — GABAPENTIN 300 MG PO CAPS
300.0000 mg | ORAL_CAPSULE | ORAL | Status: AC
  Administered 2021-01-02: 300 mg via ORAL
  Filled 2021-01-02: qty 1

## 2021-01-02 MED ORDER — DEXAMETHASONE SODIUM PHOSPHATE 10 MG/ML IJ SOLN
INTRAMUSCULAR | Status: AC
  Filled 2021-01-02: qty 1

## 2021-01-02 MED ORDER — KETAMINE HCL 10 MG/ML IJ SOLN
INTRAMUSCULAR | Status: DC | PRN
  Administered 2021-01-02: 25 mg via INTRAVENOUS

## 2021-01-02 MED ORDER — ORAL CARE MOUTH RINSE
15.0000 mL | Freq: Once | OROMUCOSAL | Status: AC
  Administered 2021-01-02: 15 mL via OROMUCOSAL

## 2021-01-02 MED ORDER — PROPOFOL 10 MG/ML IV BOLUS
INTRAVENOUS | Status: DC | PRN
  Administered 2021-01-02: 200 mg via INTRAVENOUS

## 2021-01-02 MED ORDER — LACTATED RINGERS IR SOLN
Status: DC | PRN
  Administered 2021-01-02: 1000 mL

## 2021-01-02 SURGICAL SUPPLY — 79 items
ADH SKN CLS APL DERMABOND .7 (GAUZE/BANDAGES/DRESSINGS) ×3
AGENT HMST KT MTR STRL THRMB (HEMOSTASIS)
APL ESCP 34 STRL LF DISP (HEMOSTASIS)
APPLICATOR SURGIFLO ENDO (HEMOSTASIS) IMPLANT
BACTOSHIELD CHG 4% 4OZ (MISCELLANEOUS) ×1
BAG LAPAROSCOPIC 12 15 PORT 16 (BASKET) ×2 IMPLANT
BAG RETRIEVAL 12/15 (BASKET) ×4
BAG SPEC RTRVL LRG 6X4 10 (ENDOMECHANICALS) ×6
BLADE SURG SZ10 CARB STEEL (BLADE) ×3 IMPLANT
CELLS DAT CNTRL 66122 CELL SVR (MISCELLANEOUS) IMPLANT
COVER BACK TABLE 60X90IN (DRAPES) ×4 IMPLANT
COVER TIP SHEARS 8 DVNC (MISCELLANEOUS) ×3 IMPLANT
COVER TIP SHEARS 8MM DA VINCI (MISCELLANEOUS) ×4
COVER WAND RF STERILE (DRAPES) IMPLANT
DECANTER SPIKE VIAL GLASS SM (MISCELLANEOUS) ×2 IMPLANT
DERMABOND ADVANCED (GAUZE/BANDAGES/DRESSINGS) ×1
DERMABOND ADVANCED .7 DNX12 (GAUZE/BANDAGES/DRESSINGS) ×3 IMPLANT
DRAPE ARM DVNC X/XI (DISPOSABLE) ×12 IMPLANT
DRAPE COLUMN DVNC XI (DISPOSABLE) ×3 IMPLANT
DRAPE DA VINCI XI ARM (DISPOSABLE) ×16
DRAPE DA VINCI XI COLUMN (DISPOSABLE) ×4
DRAPE SHEET LG 3/4 BI-LAMINATE (DRAPES) ×4 IMPLANT
DRAPE SURG IRRIG POUCH 19X23 (DRAPES) ×4 IMPLANT
DRSG OPSITE POSTOP 4X6 (GAUZE/BANDAGES/DRESSINGS) IMPLANT
DRSG OPSITE POSTOP 4X8 (GAUZE/BANDAGES/DRESSINGS) IMPLANT
ELECT PENCIL ROCKER SW 15FT (MISCELLANEOUS) ×2 IMPLANT
ELECT REM PT RETURN 15FT ADLT (MISCELLANEOUS) ×4 IMPLANT
GLOVE SURG ENC MOIS LTX SZ6 (GLOVE) ×16 IMPLANT
GLOVE SURG ENC MOIS LTX SZ6.5 (GLOVE) ×8 IMPLANT
GOWN STRL REUS W/ TWL LRG LVL3 (GOWN DISPOSABLE) ×14 IMPLANT
GOWN STRL REUS W/TWL LRG LVL3 (GOWN DISPOSABLE) ×20
HOLDER FOLEY CATH W/STRAP (MISCELLANEOUS) IMPLANT
IRRIG SUCT STRYKERFLOW 2 WTIP (MISCELLANEOUS) ×4
IRRIGATION SUCT STRKRFLW 2 WTP (MISCELLANEOUS) ×3 IMPLANT
KIT PROCEDURE DA VINCI SI (MISCELLANEOUS)
KIT PROCEDURE DVNC SI (MISCELLANEOUS) IMPLANT
KIT TURNOVER KIT A (KITS) ×4 IMPLANT
MANIPULATOR UTERINE 4.5 ZUMI (MISCELLANEOUS) ×4 IMPLANT
NDL SPNL 18GX3.5 QUINCKE PK (NEEDLE) IMPLANT
NEEDLE HYPO 22GX1.5 SAFETY (NEEDLE) ×4 IMPLANT
NEEDLE SPNL 18GX3.5 QUINCKE PK (NEEDLE) IMPLANT
OBTURATOR OPTICAL STANDARD 8MM (TROCAR) ×4
OBTURATOR OPTICAL STND 8 DVNC (TROCAR) ×3
OBTURATOR OPTICALSTD 8 DVNC (TROCAR) ×3 IMPLANT
PACK ROBOT GYN CUSTOM WL (TRAY / TRAY PROCEDURE) ×4 IMPLANT
PAD POSITIONING PINK XL (MISCELLANEOUS) ×4 IMPLANT
PORT ACCESS TROCAR AIRSEAL 12 (TROCAR) ×3 IMPLANT
PORT ACCESS TROCAR AIRSEAL 5M (TROCAR) ×1
POUCH SPECIMEN RETRIEVAL 10MM (ENDOMECHANICALS) ×6 IMPLANT
RETRACTOR WND ALEXIS 18 MED (MISCELLANEOUS) IMPLANT
RETRACTOR WND ALEXIS 25 LRG (MISCELLANEOUS) IMPLANT
RTRCTR WOUND ALEXIS 18CM MED (MISCELLANEOUS)
RTRCTR WOUND ALEXIS 25CM LRG (MISCELLANEOUS)
SCRUB CHG 4% DYNA-HEX 4OZ (MISCELLANEOUS) ×3 IMPLANT
SEAL CANN UNIV 5-8 DVNC XI (MISCELLANEOUS) ×9 IMPLANT
SEAL XI 5MM-8MM UNIVERSAL (MISCELLANEOUS) ×12
SET TRI-LUMEN FLTR TB AIRSEAL (TUBING) ×4 IMPLANT
SPONGE LAP 18X18 RF (DISPOSABLE) ×3 IMPLANT
SURGIFLO W/THROMBIN 8M KIT (HEMOSTASIS) IMPLANT
SUT MNCRL AB 4-0 PS2 18 (SUTURE) IMPLANT
SUT PDS AB 1 TP1 96 (SUTURE) IMPLANT
SUT VIC AB 0 CT1 27 (SUTURE) ×4
SUT VIC AB 0 CT1 27XBRD ANTBC (SUTURE) ×3 IMPLANT
SUT VIC AB 2-0 CT1 27 (SUTURE)
SUT VIC AB 2-0 CT1 TAPERPNT 27 (SUTURE) IMPLANT
SUT VIC AB 4-0 PS2 18 (SUTURE) ×8 IMPLANT
SUT VICRYL 0 UR6 27IN ABS (SUTURE) ×3 IMPLANT
SUT VLOC 180 0 9IN  GS21 (SUTURE) ×4
SUT VLOC 180 0 9IN GS21 (SUTURE) ×3 IMPLANT
SYR 10ML LL (SYRINGE) IMPLANT
SYR 20ML LL LF (SYRINGE) IMPLANT
SYR 50ML LL SCALE MARK (SYRINGE) IMPLANT
TOWEL OR NON WOVEN STRL DISP B (DISPOSABLE) ×4 IMPLANT
TRAP SPECIMEN MUCUS 40CC (MISCELLANEOUS) ×2 IMPLANT
TRAY FOLEY MTR SLVR 16FR STAT (SET/KITS/TRAYS/PACK) ×4 IMPLANT
TROCAR XCEL NON-BLD 5MMX100MML (ENDOMECHANICALS) IMPLANT
UNDERPAD 30X36 HEAVY ABSORB (UNDERPADS AND DIAPERS) ×6 IMPLANT
WATER STERILE IRR 1000ML POUR (IV SOLUTION) ×4 IMPLANT
YANKAUER SUCT BULB TIP 10FT TU (MISCELLANEOUS) ×2 IMPLANT

## 2021-01-02 NOTE — Discharge Instructions (Signed)
Return to work: 4 weeks (2 weeks with physical restrictions).  Activity: 1. Be up and out of the bed during the day.  Take a nap if needed.  You may walk up steps but be careful and use the hand rail.  Stair climbing will tire you more than you think, you may need to stop part way and rest.   2. No lifting or straining for 4 weeks.  3. No driving for 1 weeks.  Do Not drive if you are taking narcotic pain medicine.  4. Shower daily.  Use soap and water on your incision and pat dry; don't rub.   5. No sexual activity and nothing in the vagina for 8 weeks.  Medications:  - Take ibuprofen and tylenol first line for pain control. Take these regularly (every 6 hours) to decrease the build up of pain.  - If necessary, for severe pain not relieved by ibuprofen, contact Dr Serita Grit office and you will be prescribed percocet.  - While taking percocet you should take sennakot every night to reduce the likelihood of constipation. If this causes diarrhea, stop its use.  Diet: 1. Low sodium Heart Healthy Diet is recommended.  2. It is safe to use a laxative if you have difficulty moving your bowels.   Wound Care: 1. Keep clean and dry.  Shower daily.  Reasons to call the Doctor:  Fever - Oral temperature greater than 100.4 degrees Fahrenheit Foul-smelling vaginal discharge Difficulty urinating Nausea and vomiting Increased pain at the site of the incision that is unrelieved with pain medicine. Difficulty breathing with or without chest pain New calf pain especially if only on one side Sudden, continuing increased vaginal bleeding with or without clots.   Follow-up: 1. See Everitt Amber in 4 weeks.  Contacts: For questions or concerns you should contact:  Dr. Everitt Amber at [phone] After hours and on week-ends call [phone] and ask to speak to the physician on call for Gynecologic Oncology

## 2021-01-02 NOTE — Anesthesia Preprocedure Evaluation (Addendum)
Anesthesia Evaluation  Patient identified by MRN, date of birth, ID band Patient awake    Reviewed: Allergy & Precautions, NPO status , Patient's Chart, lab work & pertinent test results  History of Anesthesia Complications Negative for: history of anesthetic complications  Airway Mallampati: II  TM Distance: >3 FB Neck ROM: Full    Dental  (+) Dental Advisory Given   Pulmonary Current Smoker and Patient abstained from smoking.,    breath sounds clear to auscultation       Cardiovascular hypertension (not on medication (PIH)), (-) angina Rhythm:Regular Rate:Normal     Neuro/Psych  Headaches, PSYCHIATRIC DISORDERS (ADHD) Anxiety Depression    GI/Hepatic Neg liver ROS, GERD  Controlled,  Endo/Other  GestationalMorbid obesityPCOS  Renal/GU negative Renal ROS     Musculoskeletal   Abdominal (+) + obese,   Peds  Hematology negative hematology ROS (+)   Anesthesia Other Findings   Reproductive/Obstetrics                            Anesthesia Physical Anesthesia Plan  ASA: 2  Anesthesia Plan: General   Post-op Pain Management:    Induction: Intravenous  PONV Risk Score and Plan: 2 and Dexamethasone, Scopolamine patch - Pre-op and Diphenhydramine  Airway Management Planned: Oral ETT  Additional Equipment: None  Intra-op Plan:   Post-operative Plan: Extubation in OR  Informed Consent: I have reviewed the patients History and Physical, chart, labs and discussed the procedure including the risks, benefits and alternatives for the proposed anesthesia with the patient or authorized representative who has indicated his/her understanding and acceptance.     Dental advisory given  Plan Discussed with: CRNA and Surgeon  Anesthesia Plan Comments:        Anesthesia Quick Evaluation

## 2021-01-02 NOTE — Anesthesia Procedure Notes (Signed)
Procedure Name: Intubation Date/Time: 01/02/2021 9:48 AM Performed by: Eben Burow, CRNA Pre-anesthesia Checklist: Patient identified, Emergency Drugs available, Suction available and Patient being monitored Patient Re-evaluated:Patient Re-evaluated prior to induction Oxygen Delivery Method: Circle system utilized Preoxygenation: Pre-oxygenation with 100% oxygen Induction Type: IV induction Ventilation: Mask ventilation without difficulty Laryngoscope Size: Mac and 4 Grade View: Grade I Tube type: Oral Tube size: 7.0 mm Number of attempts: 1 Airway Equipment and Method: Stylet and Oral airway Placement Confirmation: ETT inserted through vocal cords under direct vision, positive ETCO2 and breath sounds checked- equal and bilateral Secured at: 22 cm Tube secured with: Tape Dental Injury: Teeth and Oropharynx as per pre-operative assessment

## 2021-01-02 NOTE — Anesthesia Postprocedure Evaluation (Signed)
Anesthesia Post Note  Patient: Marissa Lane  Procedure(s) Performed: XI ROBOTIC ASSISTED RIGHT SALPINGO OOPHORECTOMY (Right) XI ROBOTIC ASSISTED LEFT LAPAROSCOPIC OVARIAN CYSTECTOMY (Left)     Patient location during evaluation: PACU Anesthesia Type: General Level of consciousness: awake and alert, patient cooperative and oriented Pain management: pain level controlled Vital Signs Assessment: post-procedure vital signs reviewed and stable Respiratory status: spontaneous breathing, nonlabored ventilation and respiratory function stable Cardiovascular status: blood pressure returned to baseline and stable Postop Assessment: no apparent nausea or vomiting Anesthetic complications: no   No notable events documented.  Last Vitals:  Vitals:   01/02/21 1415 01/02/21 1445  BP: (!) 138/91 (!) 143/82  Pulse: 98 83  Resp: 18   Temp: (!) 36.4 C   SpO2: 99% 98%    Last Pain:  Vitals:   01/02/21 1445  TempSrc:   PainSc: 3                  Taylin Mans,E. Steffanie Mingle

## 2021-01-02 NOTE — Interval H&P Note (Signed)
History and Physical Interval Note:  01/02/2021 9:19 AM  [name]  has presented today for surgery, with the diagnosis of BILATERAL OVARIAN CYSTS.  The various methods of treatment have been discussed with the patient and family. After consideration of risks, benefits and other options for treatment, the patient has consented to  Procedure(s): XI ROBOTIC ASSISTED UNILATERAL SALPINGO OOPHORECTOMY POSSIBLE BILATERAL SALPINGO OOPHORECTOMY (N/A) XI ROBOTIC ASSISTED LAPAROSCOPIC OVARIAN CYSTECTOMY POSSIBLE STAGING (N/A) POSSIBLE XI ROBOTIC ASSISTED TOTAL HYSTERECTOMY (N/A) as a surgical intervention.  The patient's history has been reviewed, patient examined, no change in status, stable for surgery.  I have reviewed the patient's chart and labs.  Questions were answered to the patient's satisfaction.     [name]

## 2021-01-02 NOTE — Transfer of Care (Signed)
Immediate Anesthesia Transfer of Care Note  Patient: Marissa Lane  Procedure(s) Performed: XI ROBOTIC ASSISTED RIGHT SALPINGO OOPHORECTOMY (Right) XI ROBOTIC ASSISTED LEFT LAPAROSCOPIC OVARIAN CYSTECTOMY (Left)  Patient Location: PACU  Anesthesia Type:General  Level of Consciousness: awake, alert  and patient cooperative  Airway & Oxygen Therapy: Patient Spontanous Breathing and Patient connected to face mask oxygen  Post-op Assessment: Report given to RN and Post -op Vital signs reviewed and stable  Post vital signs: Reviewed and stable  Last Vitals:  Vitals Value Taken Time  BP    Temp    Pulse 111 01/02/21 1252  Resp 15 01/02/21 1252  SpO2 100 % 01/02/21 1252  Vitals shown include unvalidated device data.  Last Pain:  Vitals:   01/02/21 0845  TempSrc: Oral         Complications: No notable events documented.

## 2021-01-02 NOTE — Op Note (Addendum)
OPERATIVE NOTE  Date: 01/02/21  Preoperative Diagnosis: bilateral ovarian cysts   Postoperative Diagnosis:  same  Procedure(s) Performed: Robotic-assisted laparoscopic right salpingo-oophorectomy, left ovarian cystectomy  Surgeon: Everitt Amber, M.D.  Assistant Surgeon: Lahoma Crocker M.D. (an MD assistant was necessary for tissue manipulation, management of robotic instrumentation, retraction and positioning due to the complexity of the case and hospital policies).   Anesthesia: Gen. endotracheal.  Specimens: right ovary and fallopian tube, left ovarian cyst, pelvic washings  Estimated Blood Loss: 20 mL. Blood Replacement: None  Complications: none  Indication for Procedure:  the patient had bilateral ovarian cysts. On the left it appeared consistent with an 8cm dermoid. There was a 12cm cystic and solid ovarian mass on the right.   Operative Findings: 12cm cystic and solid right ovarian mass, removed in tact without rupture. 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 Normal appendix. Normal upper abdomen. No ascites.   [place] pathology was consistent with "low grade, epithelial lesion, no serous component, nothing high grade".   Procedure: The patient's taken to the operating room and placed under general endotracheal anesthesia testing difficulty. She is placed in a dorsolithotomy position and cervical acromial pad was placed. The arms were tucked with care taken to pad the olecranon process. And prepped and draped in usual sterile fashion. A uterine manipulator (zumi) was placed vaginally. A 23mm incision was made in the left upper quadrant palmer's point and a 5 mm Optiview trocar used to enter the abdomen under direct visualization. With entry into the abdomen and then maintenance of 15 mm of mercury the patient was placed in Trendelenburg position. An incision was made in the umbilicus and a 8 mm trochar was placed through this site. Two incisions were made lateral to the  umbilical incision in the left and right abdomen measuring 40mm. These incisions were made approximately 10 cm lateral to the umbilical incision. 8 mm robotic trochars were inserted. The robot was docked.  The abdomen was inspected as was the pelvis.  Pelvic washings were obtained. An incision was made on the right pelvic side wall peritoneum parallel to the IP ligament and the retroperitoneal space entered. The right ureter was identified and the para-rectal space was developed. A window was created in the right broad ligament above the ureter. The right infundibulopelvic vessels were skeletonized cauterized and transected. The utero-ovarian ligaments similarly were cauterized and transected. Specimen was placed in an Endo Catch bag.  The left ovarian cortex was scored and the 2 ovarian cysts were carefully dissected from the left ovarian cortex and placed in endocatch bags.The abdomen was copiously irrigated and drained and all operative sites inspected and hemostasis was assured  The robot was undocked. The specimens were morcellated contained within the bags and delivered through the left upper quadrant. The left upper quadrant incision needed to be extended to facilitate removing the large right ovarian solid tumor from the LUQ portsite.   The ports were all remove. The fascial closure at the umbilical incision and left upper quadrant port was made with 0 Vicryl.  All incisions were closed with a running subcuticular Monocryl suture. Dermabond was applied. Sponge, lap and needle counts were correct x 3.    The patient had sequential compression devices for VTE prophylaxis.         Disposition: PACU -stable         Condition: stable  Donaciano Eva, MD

## 2021-01-03 ENCOUNTER — Telehealth: Payer: Self-pay

## 2021-01-03 ENCOUNTER — Encounter (HOSPITAL_COMMUNITY): Payer: Self-pay | Admitting: Gynecologic Oncology

## 2021-01-03 ENCOUNTER — Other Ambulatory Visit: Payer: Self-pay | Admitting: Gynecologic Oncology

## 2021-01-03 DIAGNOSIS — N83201 Unspecified ovarian cyst, right side: Secondary | ICD-10-CM

## 2021-01-03 DIAGNOSIS — N83202 Unspecified ovarian cyst, left side: Secondary | ICD-10-CM

## 2021-01-03 DIAGNOSIS — G8918 Other acute postprocedural pain: Secondary | ICD-10-CM

## 2021-01-03 LAB — CYTOLOGY - NON PAP

## 2021-01-03 MED ORDER — TRAMADOL HCL 50 MG PO TABS: 50.0000 mg | ORAL_TABLET | Freq: Four times a day (QID) | ORAL | 0 refills | Status: DC | PRN

## 2021-01-03 NOTE — Progress Notes (Signed)
See RN note. Patient had reaction to oxycodone for post-op pain.

## 2021-01-03 NOTE — Telephone Encounter (Signed)
Marissa Lane called stating that she is feeling itchy after taking her oxycodone. She states she received oxycodone in the hospital yesterday and felt fine. She took a dose this morning and felt itchy and wasn't sure if it was from the CHG soap. She took a shower and washed off any soap residue. She took another dose this afternoon and felt itchy and had redness all over. Patient denies difficulty breathing or swelling and she said otherwise she feels fine. Joylene John, NP notified and recommends taking benadryl and not to take anymore of the oxycodone. A prescription for tramadol will be sent into [facility].  Notified patient to call with worsening symptoms, uncontrolled pain or any other questions or concerns. Instructed to go to ER if she is having difficulty breathing. Patient verbalizes understanding.   Pt has the office number [phone] and after hours number [phone] to call if she has any questions or concerns

## 2021-01-03 NOTE — Telephone Encounter (Signed)
Spoke with Marissa Lane this morning. She states she is eating, drinking and urinating well. She has not had a BM yet but is passing gas. She is taking senokot as prescribed and encouraged her to drink plenty of water. She denies fever or chills. Incisions are dry and intact. Her pain is mostly controlled with tylenol and ibuprofen. She did take one oxycodone this morning.  She states she is having some vaginal bleeding, it is bright red blood and she passed a clot. Its about as heavy as a period. Bleeding was a bit heavier yesterday but has improved today.   Instructed to call office with any fever, chills, purulent drainage, uncontrolled pain or any other questions or concerns. Patient verbalizes understanding.  Pt aware of post op appointments as well as the office number [phone] and after hours number [phone] to call if she has any questions or concerns

## 2021-01-04 LAB — SURGICAL PATHOLOGY

## 2021-01-05 ENCOUNTER — Telehealth: Payer: Self-pay

## 2021-01-05 ENCOUNTER — Ambulatory Visit: Payer: Commercial Managed Care - PPO | Admitting: Gynecologic Oncology

## 2021-01-05 NOTE — Telephone Encounter (Signed)
Spoke with Arneisha to let her know that Dr. Denman George will be discussing her pathology at tumor board on Monday 6/20.  Dr. Denman George will call [place] on Monday after speaking with the pathologist. Patient verbalized understanding.

## 2021-01-05 NOTE — Telephone Encounter (Signed)
Marissa Lane called this morning stating she saw her pathology results on MyChart and would like to speak with someone about the results. Instructed that we will take a look at the results and call her back. Patient verbalized understanding.

## 2021-01-08 ENCOUNTER — Other Ambulatory Visit: Payer: Self-pay | Admitting: Gynecologic Oncology

## 2021-01-08 ENCOUNTER — Encounter: Payer: Commercial Managed Care - PPO | Admitting: Obstetrics & Gynecology

## 2021-01-08 ENCOUNTER — Ambulatory Visit (HOSPITAL_BASED_OUTPATIENT_CLINIC_OR_DEPARTMENT_OTHER): Payer: Commercial Managed Care - PPO | Admitting: Gynecologic Oncology

## 2021-01-08 ENCOUNTER — Telehealth: Payer: Self-pay

## 2021-01-08 DIAGNOSIS — Z90721 Acquired absence of ovaries, unilateral: Secondary | ICD-10-CM

## 2021-01-08 DIAGNOSIS — C561 Malignant neoplasm of right ovary: Secondary | ICD-10-CM | POA: Insufficient documentation

## 2021-01-08 DIAGNOSIS — Z7189 Other specified counseling: Secondary | ICD-10-CM

## 2021-01-08 NOTE — Progress Notes (Signed)
Gynecologic Oncology Multi-Disciplinary Disposition Conference Note  Date of the Conference: January 08, 2021  Patient Name: Marissa Lane  Referring Provider: Dr. Janyth Pupa, Gynecologist Primary GYN Oncologist: Dr. 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 Recommendations are for referral to endocrinology for further evaluation, thyroidectomy, and radioactive iodine ablation.   This Multidisciplinary conference took place involving physicians from [place], [place], Radiation Oncology, Pathology, Radiology along with the Gynecologic Oncology Nurse Practitioner and RN.  Comprehensive assessment of the patient's malignancy, staging, need for surgery, chemotherapy, radiation therapy, and need for further testing were reviewed. Supportive measures, both inpatient and following discharge were also discussed. The recommended plan of care is documented. Greater than 35 minutes were spent correlating and coordinating this patient's care.

## 2021-01-08 NOTE — Progress Notes (Signed)
Gynecologic Oncology Telehealth Follow-up Note  I connected with Marissa Lane on 01/09/21 at 11:30 AM EDT by telephone and verified that I am speaking with the correct person using two identifiers.  I discussed the limitations, risks, security and privacy concerns of performing an evaluation and management service by telemedicine and the availability of in-person appointments. I also discussed with the patient that there may be a patient responsible charge related to this service. The patient expressed understanding and agreed to proceed.  Other persons participating in the visit and their role in the encounter: none.  Patient's location: home Provider's location: [place]  Chief Complaint:  Chief Complaint  Patient presents with   struma ovarii    Assessment/Plan:  [name]  is a 30 y.o.  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  Given the size of the malignant tumor, best case report data and recommendations support consideration of thyroidectomy, followed by radioactive iodine ablation and then serial thyroxine screening at 6 monthly intervals for 2 years, then 12 monthly for a total of 20 years surveillance.  There is no benefit for completion hysterectomy/oophorectomy in clinical stage I disease, and the potential for some harm in inducting menopause early for this patient.   We will refer her to endocrinology to discuss next steps.    HPI: Ms  Marissa Lane is a 30 year old P3 who was seen in consultation at the request of Dr Nelda Marseille for evaluation of bilateral ovarian cysts.  The patient was having some abnormal uterine bleeding that was treated with placement of a Mirena IUD in early May 2022.  2 confirmed the placement of the IUD a transvaginal ultrasound was performed in the office which identified bilateral cystic masses.  The uterus measured 8.3 x 5.5 x 5.6 cm with a 6 mm tract.  Right ovary measured 4.6 x 4.2 x 5.2 cm and a 6.3 x 6.7  x 6.4 cm anechoic structure seen within the right ovary.  There is no abnormal blood flow noted within the ovary.  The left ovary was not visualized but on subsequent MRI of the pelvis on 12/07/2020 the left ovary was visualized is an 8 cm benign appearing dermoid cyst and a separate 11 cm complex cystic and solid masses in the anterior pelvic Likely arising from the right ovary.  This mass has solid components showing contrast-enhancement and no internal fat within the mass.  The patient reported some intermittent pelvic pain preceding discovery of the mass which had lasted for approximately 2 months.  Interval Hx:  On 01/02/2021 she underwent robotic assisted right salpingo-oophorectomy and left ovarian cystectomy. Intraoperative findings were significant for an 11 cm complex right ovarian cystic mass with a large solid element, removed contained and unruptured.  It was morcellated within a containment bag for specimen retrieval.  The left ovary contained 2 dermoid cysts both removed.  The uterus was grossly normal.  The peritoneal cavity was grossly normal. Surgery was uncomplicated.  Final pathology revealed a malignant stroma ovarian measuring 8 cm in the right ovary, papillary, follicular variant.  There was no surface involvement identified washings were negative.  Benign thyroid tissue was seen within the ovary making the diagnosis of malignant tumor very more likely rather than metastatic thyroid cancer to the ovary.  Stage was stage Ia.  Her case was discussed at multidisciplinary tumor board conference and recommendations were for consideration of thyroidectomy and ablative radioactive iodine.  Since surgery she has done well  with no specific complaints.   Current Meds:  Outpatient Encounter Medications as of 01/08/2021  Medication Sig   acetaminophen (TYLENOL) 325 MG tablet Take 650 mg by mouth every 6 (six) hours as needed for moderate pain.   clonazePAM (KLONOPIN) 0.5 MG tablet Take 1/2  tab po BID prn severe anxiety   ibuprofen (ADVIL) 800 MG tablet Take 1 tablet (800 mg total) by mouth every 8 (eight) hours as needed for moderate pain. For AFTER surgery only   levonorgestrel (MIRENA) 20 MCG/24HR IUD 1 each by Intrauterine route once.   naproxen sodium (ALEVE) 220 MG tablet Take 220 mg by mouth 2 (two) times daily as needed (pain).   senna-docusate (SENOKOT-S) 8.6-50 MG tablet Take 2 tablets by mouth at bedtime. For AFTER surgery, do not take if having diarrhea   traMADol (ULTRAM) 50 MG tablet Take 1 tablet (50 mg total) by mouth every 6 (six) hours as needed for severe pain. For AFTER surgery, do not take with other narcotic pain meds, do not take and drive   No facility-administered encounter medications on file as of 01/08/2021.    Allergy:  Allergies  Allergen Reactions   Bupropion Other (See Comments)    WORSENING DEPRESSION   Cymbalta [Duloxetine Hcl] Other (See Comments)    Worsening depression   Silicone     Contacts-eyes itching   Vicodin [Hydrocodone-Acetaminophen] Other (See Comments)    Bad migraine   Zofran [Ondansetron Hcl] Itching    Social Hx:   Social History   Socioeconomic History   Marital status: Married    Spouse name: Estefany Goebel   Number of children: 3   Years of education: Not on file   Highest education level: Associate degree: occupational, Hotel manager, or vocational program  Occupational History   Occupation: LPN  Tobacco Use   Smoking status: Every Day    Packs/day: 0.50    Years: 10.00    Pack years: 5.00    Types: Cigarettes   Smokeless tobacco: Never  Vaping Use   Vaping Use: Never used  Substance and Sexual Activity   Alcohol use: No   Drug use: No   Sexual activity: Yes    Birth control/protection: I.U.D.  Other Topics Concern   Not on file  Social History Narrative   Not on file   Social Determinants of Health   Financial Resource Strain: Low Risk    Difficulty of Paying Living Expenses: Not very hard  Food  Insecurity: No Food Insecurity   Worried About Running Out of Food in the Last Year: Never true   Ran Out of Food in the Last Year: Never true  Transportation Needs: No Transportation Needs   Lack of Transportation (Medical): No   Lack of Transportation (Non-Medical): No  Physical Activity: Inactive   Days of Exercise per Week: 0 days   Minutes of Exercise per Session: 0 min  Stress: Stress Concern Present   Feeling of Stress : Rather much  Social Connections: Moderately Isolated   Frequency of Communication with Friends and Family: More than three times a week   Frequency of Social Gatherings with Friends and Family: More than three times a week   Attends Religious Services: Never   Marine scientist or Organizations: No   Attends Archivist Meetings: Never   Marital Status: Married  Human resources officer Violence: Not At Risk   Fear of Current or Ex-Partner: No   Emotionally Abused: No   Physically Abused: No   Sexually  Abused: No    Past Surgical Hx:  Past Surgical History:  Procedure Laterality Date   CHOLECYSTECTOMY  03/06/2012   Procedure: LAPAROSCOPIC CHOLECYSTECTOMY;  Surgeon: Donato Heinz, MD;  Location: [facility];  Service: General;  Laterality: N/A;   ESOPHAGOGASTRODUODENOSCOPY N/A 09/09/2012   Procedure: ESOPHAGOGASTRODUODENOSCOPY (EGD);  Surgeon: Rogene Houston, MD;  Location: [facility];  Service: Endoscopy;  Laterality: N/A;  [place] OVARIAN CYSTECTOMY Left 01/02/2021   Procedure: XI ROBOTIC ASSISTED LEFT LAPAROSCOPIC OVARIAN CYSTECTOMY;  Surgeon: Everitt Amber, MD;  Location: [facility];  Service: Gynecology;  Laterality: Left;   ROBOTIC ASSISTED SALPINGO OOPHERECTOMY Right 01/02/2021   Procedure: XI ROBOTIC ASSISTED RIGHT SALPINGO OOPHORECTOMY;  Surgeon: Everitt Amber, MD;  Location: [facility];  Service: Gynecology;  Laterality: Right;   SKIN GRAFT  2009   gum graft   TONSILLECTOMY      Past Medical Hx:  Past  Medical History:  Diagnosis Date   Acne    Anxiety    Chest pain    Chronic hypertension    Diabetes mellitus without complication (HCC)    Dyspnea    Dysthymic disorder    over dose age 89   GERD (gastroesophageal reflux disease)    Gestational diabetes    metformin   H/O urinary frequency 06/25/2013   Irregular intermenstrual bleeding 10/12/2013   Lipoma    right scapula   Obesity    Palpitations    PCOS (polycystic ovarian syndrome)    Postpartum depression    Tobacco abuse     Past Gynecological History:   No LMP recorded. (Menstrual status: IUD).  Family Hx:  Family History  Adopted: Yes  Problem Relation Age of Onset   Congestive Heart Failure Mother    Irritable bowel syndrome Mother    Ulcers Mother    Anemia Mother    Drug abuse Mother    Hypertension Brother    Colon cancer Maternal Grandmother 63   Anxiety disorder Maternal Grandmother    Heart disease Maternal Grandfather 72       MI    Review of Systems:  Constitutional  Feels well,    ENT Normal appearing ears and nares bilaterally Skin/Breast  No rash, sores, jaundice, itching, dryness Cardiovascular  No chest pain, shortness of breath, or edema  Pulmonary  No cough or wheeze.  Gastro Intestinal  No nausea, vomitting, or diarrhoea. No bright red blood per rectum, no abdominal pain, change in bowel movement, or constipation.  Genito Urinary  No frequency, urgency, dysuria, + pelvic pain Musculo Skeletal  No myalgia, arthralgia, joint swelling or pain  Neurologic  No weakness, numbness, change in gait,  Psychology  No depression, anxiety, insomnia.   Vitals:  There were no vitals taken for this visit.  Physical Exam: Deferred  I discussed the assessment and treatment plan with the patient. The patient was provided with an opportunity to ask questions and all were answered. The patient agreed with the plan and demonstrated an understanding of the instructions.   The patient was advised  to call back or see an in-person evaluation if the symptoms worsen or if the condition fails to improve as anticipated.   I provided 20 minutes of non face-to-face telephone visit time during this encounter, and > 50% was spent counseling as documented under my assessment & plan.    [name], MD  01/09/2021, 1:22 PM

## 2021-01-08 NOTE — Telephone Encounter (Signed)
Spoke with Noelene to let her know that Dr. Denman George has setup a phone visit with her today at 11:30 am. Patient is in agreement of phone visit and is ok with the time.

## 2021-01-09 ENCOUNTER — Encounter: Payer: Self-pay | Admitting: Gynecologic Oncology

## 2021-01-10 ENCOUNTER — Telehealth: Payer: Self-pay | Admitting: *Deleted

## 2021-01-10 NOTE — Telephone Encounter (Signed)
Called and left Dr Buddy Duty office a message regarding the referral; left a message to call the office back

## 2021-01-11 NOTE — Telephone Encounter (Signed)
Dr Buddy Duty office called back, they didn't receive the referral fax. Records fax today

## 2021-01-18 ENCOUNTER — Other Ambulatory Visit: Payer: Self-pay

## 2021-01-18 ENCOUNTER — Encounter: Payer: Self-pay | Admitting: General Surgery

## 2021-01-18 ENCOUNTER — Ambulatory Visit (INDEPENDENT_AMBULATORY_CARE_PROVIDER_SITE_OTHER): Payer: Commercial Managed Care - PPO | Admitting: General Surgery

## 2021-01-18 VITALS — BP 134/84 | HR 111 | Temp 98.3°F | Resp 14 | Ht 64.0 in | Wt 211.6 lb

## 2021-01-18 DIAGNOSIS — D171 Benign lipomatous neoplasm of skin and subcutaneous tissue of trunk: Secondary | ICD-10-CM | POA: Diagnosis not present

## 2021-01-18 NOTE — Progress Notes (Signed)
[facility] History and Physical  Reason for Referral: Lipoma on back  Referring Physician: Dr. Wolfgang Phoenix   Chief Complaint   New Patient (Initial Visit)     Marissa Lane is a 30 y.o. female.  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 She comes in today after noticing a bulge on her right upper back at the scalpula. She says she has lost some weight in the last year and thinks she is just noticing it more and comes in with concerns given her recent diagnosis.     Past Medical History:  Diagnosis Date   Acne    Anxiety    Chest pain    Chronic hypertension    Diabetes mellitus without complication (HCC)    Dyspnea    Dysthymic disorder    over dose age 73   GERD (gastroesophageal reflux disease)    Gestational diabetes    metformin   H/O urinary frequency 06/25/2013   Irregular intermenstrual bleeding 10/12/2013   Lipoma    right scapula   Obesity    Palpitations    PCOS (polycystic ovarian syndrome)    Postpartum depression    Tobacco abuse     Past Surgical History:  Procedure Laterality Date   CHOLECYSTECTOMY  03/06/2012   Procedure: LAPAROSCOPIC CHOLECYSTECTOMY;  Surgeon: Donato Heinz, MD;  Location: [facility];  Service: General;  Laterality: N/A;   ESOPHAGOGASTRODUODENOSCOPY N/A 09/09/2012   Procedure: ESOPHAGOGASTRODUODENOSCOPY (EGD);  Surgeon: Rogene Houston, MD;  Location: [facility];  Service: Endoscopy;  Laterality: N/A;  [place] OVARIAN CYSTECTOMY Left 01/02/2021   Procedure: XI ROBOTIC ASSISTED LEFT LAPAROSCOPIC OVARIAN CYSTECTOMY;  Surgeon: Everitt Amber, MD;  Location: [facility];  Service: Gynecology;  Laterality: Left;   ROBOTIC ASSISTED SALPINGO OOPHERECTOMY Right 01/02/2021   Procedure: XI ROBOTIC ASSISTED RIGHT SALPINGO OOPHORECTOMY;  Surgeon: Everitt Amber, MD;  Location: [facility];  Service: Gynecology;  Laterality: Right;   SKIN GRAFT  2009   gum graft   TONSILLECTOMY      Family History  Adopted: Yes  Problem Relation Age of Onset   Congestive Heart Failure Mother    Irritable bowel syndrome Mother    Ulcers Mother    Anemia Mother    Drug abuse Mother    Hypertension Brother    Colon cancer Maternal Grandmother 59   Anxiety disorder Maternal Grandmother    Heart disease Maternal Grandfather 5       MI    Social History   Tobacco Use   Smoking status: Every Day    Packs/day: 0.50    Years: 10.00    Pack years: 5.00    Types: Cigarettes   Smokeless tobacco: Never  Vaping Use   Vaping Use: Never used  Substance Use Topics   Alcohol use: No   Drug use: No    Medications: I have reviewed the patient's current medications. Allergies as of 01/18/2021       Reactions   Bupropion Other (See Comments)   WORSENING DEPRESSION   Cymbalta [duloxetine Hcl] Other (See Comments)   Worsening depression   Oxycodone Itching   Silicone    Contacts-eyes itching   Vicodin [hydrocodone-acetaminophen] Other (See Comments)   Bad migraine   Zofran [ondansetron Hcl] Itching        Medication List  Accurate as of January 18, 2021  2:36 PM. If you have any questions, ask your nurse or doctor.          STOP taking these medications    senna-docusate 8.6-50 MG tablet Commonly known as: Senokot-S Stopped by: Virl Cagey, MD   traMADol 50 MG tablet Commonly known as: ULTRAM Stopped by: Virl Cagey, MD       TAKE these medications    acetaminophen 325 MG tablet Commonly known as: TYLENOL Take 650 mg by mouth every 6 (six) hours as needed for moderate pain.   clonazePAM 0.5 MG tablet Commonly known as: KLONOPIN Take 1/2 tab po BID prn severe anxiety   ibuprofen 800 MG tablet Commonly known as: ADVIL Take 1 tablet (800 mg total) by mouth every 8 (eight) hours as needed for moderate pain. For AFTER surgery only    levonorgestrel 20 MCG/24HR IUD Commonly known as: MIRENA 1 each by Intrauterine route once.   naproxen sodium 220 MG tablet Commonly known as: ALEVE Take 220 mg by mouth 2 (two) times daily as needed (pain).         ROS:  A comprehensive review of systems was negative except for: Gastrointestinal: positive for abdominal pain Musculoskeletal: positive for back pain  Blood pressure 134/84, pulse (!) 111, temperature 98.3 F (36.8 C), temperature source Oral, resp. rate 14, height 5\' 4"  (1.626 m), weight 211 lb 9.6 oz (96 kg), SpO2 98 %. Physical Exam Vitals reviewed.  Constitutional:      Appearance: Normal appearance.  HENT:     Head: Normocephalic.     Nose: Nose normal.  Eyes:     Extraocular Movements: Extraocular movements intact.  Cardiovascular:     Rate and Rhythm: Normal rate and regular rhythm.  Pulmonary:     Effort: Pulmonary effort is normal.     Breath sounds: Normal breath sounds.  Abdominal:     General: There is no distension.     Palpations: Abdomen is soft.  Musculoskeletal:        General: Normal range of motion.     Comments: Right upper back with fatty, soft mass consistent with lipoma at the right scapula area/ just inferior ~ 10cm  Skin:    General: Skin is warm.  Neurological:     General: No focal deficit present.     Mental Status: She is alert and oriented to person, place, and time.  Psychiatric:        Mood and Affect: Mood normal.        Behavior: Behavior normal.        Thought Content: Thought content normal.        Judgment: Judgment normal.    Results: CLINICAL DATA:  Lump on back.   EXAM: ULTRASOUND OF CHEST SOFT TISSUES   TECHNIQUE: Ultrasound examination of the chest wall soft tissues was performed in the area of clinical concern.   COMPARISON:  No recent prior.   FINDINGS: A 12.3 x 3.8 x 9.3 cm isoechoic to hyperechoic well-circumscribed solid mass noted over the right back in the area of clinical finding. This  is most likely a lipoma. MRI should be considered for confirmation. No abnormal fluid collections.   IMPRESSION: 12.3 x 3.8 x 9.3 cm isoechoic to hyperechoic well-circumscribed solid mass noted over the right back in area of clinical abnormality. This is most likely a lipoma. MRI should be considered for confirmation.     Electronically Signed   By: Marcello Moores  Register  On: 12/20/2020 10:23  Personally reviewed CT 11/2020 and right scalpula area just inferior, see part of the fatty tissue consistent with lipoma   Assessment & Plan:  Marissa Lane is a 30 y.o. female with a lipoma on the right back. I discussed with her that this is likely a lipoma given the characteristics and that we can remove it versus getting an MRI to give her more information. Discussed excision and risk of bleeding, infection, recurrence, or finding something unexpected. She is in the process of seeing someone for a total thyroidectomy for her malignant tumor ovarii and wants to see when that is scheduled before making any plans.   -Discussed her calling when she is ready to schedule, would do sutures that remain in place given the location and movement    All questions were answered to the satisfaction of the patient.   Virl Cagey 01/18/2021, 2:36 PM

## 2021-01-18 NOTE — Patient Instructions (Signed)
Lipoma  A lipoma is a noncancerous (benign) tumor that is made up of fat cells. This is a very common type of soft-tissue growth. Lipomas are usually found under the skin (subcutaneous). They may occur in any tissue of the body that contains fat. Common areas forlipomas to appear include the back, arms, shoulders, buttocks, and thighs. Lipomas grow slowly, and they are usually painless. Most lipomas do not causeproblems and do not require treatment. What are the causes? The cause of this condition is not known. What increases the risk? You are more likely to develop this condition if: You are 40-60 years old. You have a family history of lipomas. What are the signs or symptoms? A lipoma usually appears as a small, round bump under the skin. In most cases, the lump will: Feel soft or rubbery. Not cause pain or other symptoms. However, if a lipoma is located in an area where it pushes on nerves, it canbecome painful or cause other symptoms. How is this diagnosed? A lipoma can usually be diagnosed with a physical exam. You may also have tests to confirm the diagnosis and to rule out other conditions. Tests may include: Imaging tests, such as a CT scan or an MRI. Removal of a tissue sample to be looked at under a microscope (biopsy). How is this treated? Treatment for this condition depends on the size of the lipoma and whether it is causing any symptoms. For small lipomas that are not causing problems, no treatment is needed. If a lipoma is bigger or it causes problems, surgery may be done to remove the lipoma. Lipomas can also be removed to improve appearance. Most often, the procedure is done after applying a medicine that numbs the area (local anesthetic). Liposuction may be done to reduce the size of the lipoma before it is removed through surgery, or it may be done to remove the lipoma. Lipomas are removed with this method in order to limit incision size and scarring. A liposuction tube is  inserted through a small incision into the lipoma, and the contents of the lipoma are removed through the tube with suction. Follow these instructions at home: Watch your lipoma for any changes. Keep all follow-up visits as told by your health care provider. This is important. Contact a health care provider if: Your lipoma becomes larger or hard. Your lipoma becomes painful, red, or increasingly swollen. These could be signs of infection or a more serious condition. Get help right away if: You develop tingling or numbness in an area near the lipoma. This could indicate that the lipoma is causing nerve damage. Summary A lipoma is a noncancerous tumor that is made up of fat cells. Most lipomas do not cause problems and do not require treatment. If a lipoma is bigger or it causes problems, surgery may be done to remove the lipoma. Contact a health care provider if your lipoma becomes larger or hard, or if it becomes painful, red, or increasingly swollen. Pain, redness, and swelling could be signs of infection or a more serious condition. This information is not intended to replace advice given to you by your health care provider. Make sure you discuss any questions you have with your healthcare provider. Document Revised: 02/22/2019 Document Reviewed: 02/22/2019 Elsevier Patient Education  2022 Elsevier Inc.  

## 2021-01-23 ENCOUNTER — Inpatient Hospital Stay: Payer: Commercial Managed Care - PPO | Attending: Gynecologic Oncology | Admitting: Gynecologic Oncology

## 2021-01-23 ENCOUNTER — Other Ambulatory Visit: Payer: Self-pay

## 2021-01-23 VITALS — BP 144/85 | HR 93 | Temp 97.3°F | Resp 18 | Ht 64.0 in | Wt 214.6 lb

## 2021-01-23 DIAGNOSIS — Z7189 Other specified counseling: Secondary | ICD-10-CM

## 2021-01-23 DIAGNOSIS — F17210 Nicotine dependence, cigarettes, uncomplicated: Secondary | ICD-10-CM | POA: Insufficient documentation

## 2021-01-23 DIAGNOSIS — Z90721 Acquired absence of ovaries, unilateral: Secondary | ICD-10-CM | POA: Diagnosis not present

## 2021-01-23 DIAGNOSIS — C561 Malignant neoplasm of right ovary: Secondary | ICD-10-CM | POA: Diagnosis not present

## 2021-01-23 NOTE — Patient Instructions (Signed)
Dr Denman George is recommending return to work at 4 weeks postop. At that time there will be no further restrictions.  She recommends ongoing routine gyn follow-up with Dr Nelda Marseille.

## 2021-01-23 NOTE — Progress Notes (Signed)
Gynecologic Oncology  Follow-up Note  Chief Complaint:  Chief Complaint  Patient presents with   Malignant struma ovarii of right ovary [facility])    Assessment/Plan:  [name]  is a 30 y.o.  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  Given the size of the malignant tumor, best case report data and recommendations support consideration of thyroidectomy, followed by radioactive iodine ablation and then serial thyroxine screening at 6 monthly intervals for 2 years, then 12 monthly for a total of 20 years surveillance.  She has established care with endocrinology and general surgery for this care. She will follow-up with endocrinology for monitoring of her thyroid enzymes long-term (effectively serving as tumor markers).     HPI: Ms  Marissa Lane is a 30 year old P3 who was seen in consultation at the request of Dr Nelda Marseille for evaluation of bilateral ovarian cysts.  The patient was having some abnormal uterine bleeding that was treated with placement of a Mirena IUD in early May 2022.  2 confirmed the placement of the IUD a transvaginal ultrasound was performed in the office which identified bilateral cystic masses.  The uterus measured 8.3 x 5.5 x 5.6 cm with a 6 mm tract.  Right ovary measured 4.6 x 4.2 x 5.2 cm and a 6.3 x 6.7 x 6.4 cm anechoic structure seen within the right ovary.  There is no abnormal blood flow noted within the ovary.  The left ovary was not visualized but on subsequent MRI of the pelvis on 12/07/2020 the left ovary was visualized is an 8 cm benign appearing dermoid cyst and a separate 11 cm complex cystic and solid masses in the anterior pelvic Likely arising from the right ovary.  This mass has solid components showing contrast-enhancement and no internal fat within the mass.  The patient reported some intermittent pelvic pain preceding discovery of the mass which had lasted for approximately 2 months.  Interval Hx:  On 01/02/2021 she underwent robotic  assisted right salpingo-oophorectomy and left ovarian cystectomy. Intraoperative findings were significant for an 11 cm complex right ovarian cystic mass with a large solid element, removed contained and unruptured.  It was morcellated within a containment bag for specimen retrieval.  The left ovary contained 2 dermoid cysts both removed.  The uterus was grossly normal.  The peritoneal cavity was grossly normal. Surgery was uncomplicated.  Final pathology revealed a malignant stroma ovarian measuring 8 cm in the right ovary, papillary, follicular variant.  There was no surface involvement identified washings were negative.  Benign thyroid tissue was seen within the ovary making the diagnosis of malignant tumor very more likely rather than metastatic thyroid cancer to the ovary.  Stage was stage Ia.  Her case was discussed at multidisciplinary tumor board conference and recommendations were for consideration of thyroidectomy and ablative radioactive iodine.  Since surgery she has done well with no specific complaints.   Current Meds:  Outpatient Encounter Medications as of 01/23/2021  Medication Sig   acetaminophen (TYLENOL) 325 MG tablet Take 650 mg by mouth every 6 (six) hours as needed for moderate pain.   clonazePAM (KLONOPIN) 0.5 MG tablet Take 1/2 tab po BID prn severe anxiety   ibuprofen (ADVIL) 800 MG tablet Take 1 tablet (800 mg total) by mouth every 8 (eight) hours as needed for moderate pain. For AFTER surgery only   levonorgestrel (MIRENA) 20 MCG/24HR IUD 1 each by Intrauterine route once.   naproxen sodium (ALEVE) 220 MG tablet Take  220 mg by mouth 2 (two) times daily as needed (pain).   [DISCONTINUED] senna-docusate (SENOKOT-S) 8.6-50 MG tablet Take 2 tablets by mouth at bedtime. For AFTER surgery, do not take if having diarrhea   [DISCONTINUED] traMADol (ULTRAM) 50 MG tablet Take 1 tablet (50 mg total) by mouth every 6 (six) hours as needed for severe pain. For AFTER surgery, do not take  with other narcotic pain meds, do not take and drive   No facility-administered encounter medications on file as of 01/23/2021.    Allergy:  Allergies  Allergen Reactions   Bupropion Other (See Comments)    WORSENING DEPRESSION   Cymbalta [Duloxetine Hcl] Other (See Comments)    Worsening depression   Oxycodone Itching   Silicone     Contacts-eyes itching   Vicodin [Hydrocodone-Acetaminophen] Other (See Comments)    Bad migraine   Zofran [Ondansetron Hcl] Itching    Social Hx:   Social History   Socioeconomic History   Marital status: Married    Spouse name: Tiernan Millikin   Number of children: 3   Years of education: Not on file   Highest education level: Associate degree: occupational, Hotel manager, or vocational program  Occupational History   Occupation: LPN  Tobacco Use   Smoking status: Every Day    Packs/day: 0.50    Years: 10.00    Pack years: 5.00    Types: Cigarettes   Smokeless tobacco: Never  Vaping Use   Vaping Use: Never used  Substance and Sexual Activity   Alcohol use: No   Drug use: No   Sexual activity: Yes    Birth control/protection: I.U.D.  Other Topics Concern   Not on file  Social History Narrative   Not on file   Social Determinants of Health   Financial Resource Strain: Low Risk    Difficulty of Paying Living Expenses: Not very hard  Food Insecurity: No Food Insecurity   Worried About Running Out of Food in the Last Year: Never true   Ran Out of Food in the Last Year: Never true  Transportation Needs: No Transportation Needs   Lack of Transportation (Medical): No   Lack of Transportation (Non-Medical): No  Physical Activity: Inactive   Days of Exercise per Week: 0 days   Minutes of Exercise per Session: 0 min  Stress: Stress Concern Present   Feeling of Stress : Rather much  Social Connections: Moderately Isolated   Frequency of Communication with Friends and Family: More than three times a week   Frequency of Social Gatherings  with Friends and Family: More than three times a week   Attends Religious Services: Never   Marine scientist or Organizations: No   Attends Archivist Meetings: Never   Marital Status: Married  Human resources officer Violence: Not At Risk   Fear of Current or Ex-Partner: No   Emotionally Abused: No   Physically Abused: No   Sexually Abused: No    Past Surgical Hx:  Past Surgical History:  Procedure Laterality Date   CHOLECYSTECTOMY  03/06/2012   Procedure: LAPAROSCOPIC CHOLECYSTECTOMY;  Surgeon: Donato Heinz, MD;  Location: [facility];  Service: General;  Laterality: N/A;   ESOPHAGOGASTRODUODENOSCOPY N/A 09/09/2012   Procedure: ESOPHAGOGASTRODUODENOSCOPY (EGD);  Surgeon: Rogene Houston, MD;  [facility];  Service: Endoscopy;  Laterality: N/A;  340   MOUTH SURGERY     ROBOTIC ASSISTED LAPAROSCOPIC OVARIAN CYSTECTOMY Left 01/02/2021   Procedure: XI ROBOTIC ASSISTED LEFT LAPAROSCOPIC OVARIAN CYSTECTOMY;  Surgeon:  Everitt Amber, MD;  Location: [facility];  Service: Gynecology;  Laterality: Left;   ROBOTIC ASSISTED SALPINGO OOPHERECTOMY Right 01/02/2021   Procedure: XI ROBOTIC ASSISTED RIGHT SALPINGO OOPHORECTOMY;  Surgeon: Everitt Amber, MD;  Location: [facility];  Service: Gynecology;  Laterality: Right;   SKIN GRAFT  2009   gum graft   TONSILLECTOMY      Past Medical Hx:  Past Medical History:  Diagnosis Date   Acne    Anxiety    Chest pain    Chronic hypertension    Diabetes mellitus without complication (HCC)    Dyspnea    Dysthymic disorder    over dose age 46   GERD (gastroesophageal reflux disease)    Gestational diabetes    metformin   H/O urinary frequency 06/25/2013   Irregular intermenstrual bleeding 10/12/2013   Lipoma    right scapula   Obesity    Palpitations    PCOS (polycystic ovarian syndrome)    Postpartum depression    Tobacco abuse     Past Gynecological History:   No LMP recorded. (Menstrual status: IUD).  Family Hx:  Family History   Adopted: Yes  Problem Relation Age of Onset   Congestive Heart Failure Mother    Irritable bowel syndrome Mother    Ulcers Mother    Anemia Mother    Drug abuse Mother    Hypertension Brother    Colon cancer Maternal Grandmother 15   Anxiety disorder Maternal Grandmother    Heart disease Maternal Grandfather 40       MI    Review of Systems:  Constitutional  Feels well,    ENT Normal appearing ears and nares bilaterally Skin/Breast  No rash, sores, jaundice, itching, dryness Cardiovascular  No chest pain, shortness of breath, or edema  Pulmonary  No cough or wheeze.  Gastro Intestinal  No nausea, vomitting, or diarrhoea. No bright red blood per rectum, no abdominal pain, change in bowel movement, or constipation.  Genito Urinary  No frequency, urgency, dysuria, + persistent left pelvic pain Musculo Skeletal  No myalgia, arthralgia, joint swelling or pain  Neurologic  No weakness, numbness, change in gait,  Psychology  No depression, anxiety, insomnia.   Vitals:  Blood pressure (!) 144/85, pulse 93, temperature (!) 97.3 F (36.3 C), temperature source Tympanic, resp. rate 18, height 5\' 4"  (1.626 m), weight 214 lb 9.6 oz (97.3 kg), SpO2 99 %. Physical Exam: WD in NAD Neck  Supple NROM, without any enlargements.  Lymph Node Survey No cervical supraclavicular or inguinal adenopathy Cardiovascular  Well perfused peripheries Lungs  No increased WOB Skin  No rash/lesions/breakdown  Psychiatry  Alert and oriented to person, place, and time  Abdomen  Normoactive bowel sounds, abdomen soft, non-tender and obese without evidence of hernia. Well healed incisions.  Back No CVA tenderness Genito Urinary  deferred Rectal  deferred Extremities  No bilateral cyanosis, clubbing or edema.   30 minutes of direct face to face counseling time was spent with the patient. This included discussion about prognosis, therapy recommendations and postoperative side effects and  are beyond the scope of routine postoperative care.   [name], MD  01/23/2021, 5:36 PM

## 2021-01-24 ENCOUNTER — Telehealth: Payer: Self-pay | Admitting: *Deleted

## 2021-01-24 NOTE — Telephone Encounter (Signed)
Patient had an appt with [facility] on 01/16/2021 at 3:30 pm

## 2021-01-26 ENCOUNTER — Telehealth: Payer: Self-pay

## 2021-01-26 NOTE — Telephone Encounter (Signed)
Told Marissa Lane that the disability forms were faxed back to [employer] today.  She was written out of work through 02-13-21. Pt verbalized understanding.

## 2021-01-31 ENCOUNTER — Telehealth: Payer: Self-pay | Admitting: *Deleted

## 2021-01-31 NOTE — Telephone Encounter (Signed)
Per patient requested emailed the her short disability and life insurance policy to her. Also refax her short disability

## 2021-03-01 ENCOUNTER — Ambulatory Visit: Payer: Self-pay | Admitting: Surgery

## 2021-03-13 NOTE — Progress Notes (Addendum)
COVID swab appointment: 03/21/21  COVID Vaccine Completed: yes x2 Date COVID Vaccine completed:  Has received booster: COVID vaccine manufacturer: [place]   Date of COVID positive in last 90 days: No  PCP - Sallee Lange, MD Cardiologist - N/a  Chest x-ray - 03/14/21 Epic EKG - 12/27/20 Epic Stress Test - N/a ECHO - N/a Cardiac Cath - N/a Pacemaker/ICD device last checked: N/a Spinal Cord Stimulator: N/a  Sleep Study - N/a CPAP -   Fasting Blood Sugar - N/a Checks Blood Sugar _____ times a day  Blood Thinner Instructions: N/a Aspirin Instructions: Last Dose:  Activity level: Can go up a flight of stairs and perform activities of daily living without stopping and without symptoms of chest pain or shortness of breath.    Anesthesia review:   Patient denies shortness of breath, fever, cough and chest pain at PAT appointment   Patient verbalized understanding of instructions that were given to them at the PAT appointment. Patient was also instructed that they will need to review over the PAT instructions again at home before surgery.

## 2021-03-13 NOTE — Patient Instructions (Addendum)
DUE TO COVID-19 ONLY ONE VISITOR IS ALLOWED TO COME WITH YOU AND STAY IN THE WAITING ROOM ONLY DURING PRE OP AND PROCEDURE.   **NO VISITORS ARE ALLOWED IN THE SHORT STAY AREA OR RECOVERY ROOM!!**       Your procedure is scheduled on: 03/23/21   Report to [facility] Main  Entrance    Report to admitting at 9:45 AM   Call this number if you have problems the morning of surgery [phone]   Do not eat food :After Midnight.   May have liquids until  9:00 AM day of surgery  CLEAR LIQUID DIET  Foods Allowed                                                                     Foods Excluded  Water, Black Coffee and tea (NO milk or creamer)          liquids that you cannot  Plain Jell-O in any flavor  (No red)                                    see through such as: Fruit ices (not with fruit pulp)                                            milk, soups, orange juice              Iced Popsicles (No red)                                              All solid food                                   Apple juices Sports drinks like Gatorade (No red) Lightly seasoned clear broth or consume(fat free) Sugar, honey syrup    Oral Hygiene is also important to reduce your risk of infection.                                    Remember - BRUSH YOUR TEETH THE MORNING OF SURGERY WITH YOUR REGULAR TOOTHPASTE   Do NOT smoke after Midnight   Take these medicines the morning of surgery with A SIP OF WATER: Tylenol                              You may not have any metal on your body including hair pins, jewelry, and body piercing             Do not wear make-up, lotions, powders, perfumes, or deodorant  Do not wear nail polish including gel and S&S, artificial/acrylic nails, or any other type of covering on natural nails including finger and toenails. If you have artificial  nails, gel coating, etc. that needs to be removed by a nail salon please have this removed prior to surgery or surgery may  need to be canceled/ delayed if the surgeon/ anesthesia feels like they are unable to be safely monitored.   Do not shave  48 hours prior to surgery.    Do not bring valuables to the hospital. [place].   Contacts, dentures or bridgework may not be worn into surgery.    Patients discharged the day of surgery will not be allowed to drive home.   Please read over the following fact sheets you were given: IF YOU HAVE QUESTIONS ABOUT YOUR PRE OP INSTRUCTIONS PLEASE CALL [place] - Preparing for Surgery Before surgery, you can play an important role.  Because skin is not sterile, your skin needs to be as free of germs as possible.  You can reduce the number of germs on your skin by washing with CHG (chlorahexidine gluconate) soap before surgery.  CHG is an antiseptic cleaner which kills germs and bonds with the skin to continue killing germs even after washing. Please DO NOT use if you have an allergy to CHG or antibacterial soaps.  If your skin becomes reddened/irritated stop using the CHG and inform your nurse when you arrive at Short Stay. Do not shave (including legs and underarms) for at least 48 hours prior to the first CHG shower.  You may shave your face/neck.  Please follow these instructions carefully:  1.  Shower with CHG Soap the night before surgery and the  morning of surgery.  2.  If you choose to wash your hair, wash your hair first as usual with your normal  shampoo.  3.  After you shampoo, rinse your hair and body thoroughly to remove the shampoo.                             4.  Use CHG as you would any other liquid soap.  You can apply chg directly to the skin and wash.  Gently with a scrungie or clean washcloth.  5.  Apply the CHG Soap to your body ONLY FROM THE NECK DOWN.   Do   not use on face/ open                           Wound or open sores. Avoid contact with eyes, ears mouth and   genitals  (private parts).                       Wash face,  Genitals (private parts) with your normal soap.             6.  Wash thoroughly, paying special attention to the area where your    surgery  will be performed.  7.  Thoroughly rinse your body with warm water from the neck down.  8.  DO NOT shower/wash with your normal soap after using and rinsing off the CHG Soap.                9.  Pat yourself dry with a clean towel.            10.  Wear clean pajamas.  11.  Place clean sheets on your bed the night of your first shower and do not  sleep with pets. Day of Surgery : Do not apply any lotions/deodorants the morning of surgery.  Please wear clean clothes to the hospital/surgery center.  FAILURE TO FOLLOW THESE INSTRUCTIONS MAY RESULT IN THE CANCELLATION OF YOUR SURGERY  PATIENT SIGNATURE_________________________________  NURSE SIGNATURE__________________________________  ________________________________________________________________________

## 2021-03-14 ENCOUNTER — Encounter (HOSPITAL_COMMUNITY): Payer: Self-pay

## 2021-03-14 ENCOUNTER — Encounter (HOSPITAL_COMMUNITY)
Admission: RE | Admit: 2021-03-14 | Discharge: 2021-03-14 | Disposition: A | Discharge status: Home / Self Care | Payer: Commercial Managed Care - PPO | Source: Ambulatory Visit | Attending: Surgery | Admitting: Surgery

## 2021-03-14 ENCOUNTER — Other Ambulatory Visit: Payer: Self-pay

## 2021-03-14 ENCOUNTER — Ambulatory Visit (HOSPITAL_COMMUNITY)
Admission: RE | Admit: 2021-03-14 | Discharge: 2021-03-14 | Disposition: A | Discharge status: Home / Self Care | Payer: Commercial Managed Care - PPO | Source: Ambulatory Visit | Attending: Anesthesiology | Admitting: Anesthesiology

## 2021-03-14 DIAGNOSIS — Z01818 Encounter for other preprocedural examination: Secondary | ICD-10-CM | POA: Diagnosis present

## 2021-03-14 HISTORY — DX: Malignant (primary) neoplasm, unspecified: C80.1

## 2021-03-14 LAB — CBC
HCT: 41.3 % (ref 36.0–46.0)
Hemoglobin: 14.1 g/dL (ref 12.0–15.0)
MCH: 31.1 pg (ref 26.0–34.0)
MCHC: 34.1 g/dL (ref 30.0–36.0)
MCV: 91 fL (ref 80.0–100.0)
Platelets: 287 10*3/uL (ref 150–400)
RBC: 4.54 MIL/uL (ref 3.87–5.11)
RDW: 12.1 % (ref 11.5–15.5)
WBC: 10.1 10*3/uL (ref 4.0–10.5)
nRBC: 0 % (ref 0.0–0.2)

## 2021-03-14 LAB — BASIC METABOLIC PANEL
Anion gap: 7 (ref 5–15)
BUN: 14 mg/dL (ref 6–20)
CO2: 26 mmol/L (ref 22–32)
Calcium: 9.1 mg/dL (ref 8.9–10.3)
Chloride: 106 mmol/L (ref 98–111)
Creatinine, Ser: 0.66 mg/dL (ref 0.44–1.00)
GFR, Estimated: >60 mL/min (ref >60)
Glucose, Bld: 102 mg/dL — ABNORMAL HIGH (ref 70–99)
Potassium: 3.9 mmol/L (ref 3.5–5.1)
Sodium: 139 mmol/L (ref 135–145)

## 2021-03-14 NOTE — Progress Notes (Signed)
During interview patient thought she was spending a night in observation but surgery was booked as ambulatory. Writer called [facility] to confirm. [facility] returned call and changed surgery to outpatient in a bed. Called patient and gave her testing site information and instructions to get test on 03/21/21. Patient verbalized understanding and will get her test that day.

## 2021-03-15 ENCOUNTER — Encounter: Payer: Self-pay | Admitting: Family Medicine

## 2021-03-20 ENCOUNTER — Encounter (HOSPITAL_COMMUNITY): Payer: Self-pay | Admitting: Surgery

## 2021-03-20 DIAGNOSIS — C73 Malignant neoplasm of thyroid gland: Secondary | ICD-10-CM | POA: Diagnosis present

## 2021-03-20 DIAGNOSIS — M7989 Other specified soft tissue disorders: Secondary | ICD-10-CM | POA: Diagnosis present

## 2021-03-20 NOTE — H&P (Signed)
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 Patient had experienced irregular menses in January 2022. She underwent evaluation including a pelvic ultrasound, CT scan, and MRI. Patient ultimately came to surgery with Dr. Everitt Amber and underwent right oophorectomy and removal of a cyst from the left ovary. 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 Patient has been seen by Dr. Buddy Duty and plans are being made for treatment with radioactive iodine. In order to successfully treat the ovarian disease, she will need to have thyroidectomy to remove the normal thyroid tissue. It would be extremely rare to have primary disease within the thyroid. Patient did have a thyroid ultrasound performed at [facility]. Apparently there was some abnormality of at least one of the lymph nodes in the thyroid bed. We are trying to obtain a copy of that ultrasound report for review.  Patient has no prior history of thyroid disease. She has never been on thyroid medication. She has had no prior head or neck surgery. There is no family history of thyroid disease.  Patient also notes a soft tissue mass on the right posterior shoulder overlying the scapula. She had undergone evaluation for this in anticipation of surgical excision. Ultrasound was performed on December 20, 2020. This showed a 12 x 4 x 9 cm well-circumscribed solid mass consistent with lipoma. Patient was seen by Dr. Blake Divine at [facility] for possible excision. The patient would like to undergo concurrent excision at  the time of her thyroid surgery.  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  Review of Systems: A complete review of systems was obtained from the patient. I have reviewed this information and discussed as appropriate with the patient. See HPI as well for other ROS.  Review of Systems  Constitutional: Negative.  HENT: Negative.  Eyes: Negative.  Respiratory: Negative.  Cardiovascular: Negative.  Gastrointestinal: Negative.  Genitourinary: Negative.  Musculoskeletal: Negative.  Skin: Negative.  Neurological: Negative.  Endo/Heme/Allergies: Negative.  Psychiatric/Behavioral: Negative.    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   Allergies  Allergen Reactions   Hydrocodone-Acetaminophen Headache and Other (See Comments)  Bad migraine   Oxycodone Itching   Current Outpatient Medications on File Prior to Visit  Medication Sig  Dispense Refill   levonorgestreL (MIRENA 52 MG) 20 mcg/24 hr (6 years) IUD Insert into the uterus   No current facility-administered medications on file prior to visit.   No family history on file.   Social History   Tobacco Use   Smoking Status Former Smoker   Packs/day: 1.00  Smokeless Tobacco Never Used    Social History   Socioeconomic History   Marital status: Married  Tobacco Use   Smoking status: Former Smoker  Packs/day: 1.00   Smokeless tobacco: Never Used  Scientific laboratory technician Use: Never used  Substance and Sexual Activity   Alcohol use: Not Currently   Drug use: Defer   Sexual activity: Defer   Objective:   Vitals:  03/01/21 1425  BP: 120/86  Pulse: 102  Temp: 37 C (98.6 F)  SpO2: 100%  Weight: 98 kg (216 lb)  Height: 162.6 cm ('5\' 4"'$ )   Body mass index is 37.08 kg/m.  Physical Exam   GENERAL APPEARANCE Development: normal Nutritional status: normal Gross deformities: none  SKIN Rash, lesions, ulcers: none Induration, erythema: none Nodules: none palpable  EYES Conjunctiva and lids: normal Pupils: equal and reactive Iris: normal bilaterally  EARS, NOSE, MOUTH, THROAT External ears: no lesion or deformity External nose: no lesion or deformity Hearing: grossly normal Due to Covid-19 pandemic, patient is wearing a mask.  NECK Symmetric: yes Trachea: midline Thyroid: no palpable nodules in the thyroid bed; no associated lymphadenopathy  CHEST Respiratory effort: normal Retraction or accessory muscle use: no Breath sounds: normal bilaterally Rales, rhonchi, wheeze: none  CARDIOVASCULAR Auscultation: regular rhythm, normal rate Murmurs: none Pulses: radial pulse 2+ palpable Lower extremity edema: none  MUSCULOSKELETAL Station and gait: normal Digits and nails: no clubbing or cyanosis Muscle strength: grossly normal all extremities Range of motion: grossly normal all extremities Deformity: Overlying the right scapula in the upper right back is a large soft tissue mass which is relatively firm, discrete, mobile, and nontender measuring approximately 12 cm in greatest dimension. It is consistent with a large lipoma.  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 Patient presents today to discuss total thyroidectomy. She also wishes to discuss concurrent surgical excision of a large lipoma from the right posterior shoulder.  Patient provided with a copy of "The Thyroid Book: Medical and Surgical Treatment of Thyroid Problems", published by [employer], 16 pages. Book reviewed and explained to patient during visit today.  Today we discussed proceeding with total thyroidectomy. We would also perform a limited lymph node dissection of the central compartment. We discussed the size and location of the surgical incision. We discussed the length of the procedure. We discussed the hospital stay to be anticipated. We discussed the risk of the surgery including the risk of recurrent laryngeal nerve injury and injury to parathyroid glands. We discussed the need for lifelong thyroid hormone replacement. Patient also has a prescription for her thyroid medication provided by Dr. Buddy Duty. She plans treatment with radioactive iodine approximately 4 weeks following her thyroidectomy.  We also discussed excision of the soft  tissue mass from the right shoulder. We would likely do that at the beginning of the procedure. It may or may not require placement of a subcutaneous drain. We will send it to pathology for evaluation. We discussed potential complications including seroma formation. The patient understands and wishes to proceed.  The risks and  benefits of the procedure have been discussed at length with the patient. The patient understands the proposed procedure, potential alternative treatments, and the course of recovery to be expected. All of the patient's questions have been answered at this time. The patient wishes to proceed with surgery.   [facility] Office: [phone]

## 2021-03-21 ENCOUNTER — Other Ambulatory Visit: Payer: Self-pay | Admitting: Surgery

## 2021-03-21 LAB — SARS CORONAVIRUS 2 (TAT 6-24 HRS): SARS Coronavirus 2: NEGATIVE

## 2021-03-23 ENCOUNTER — Encounter (HOSPITAL_COMMUNITY)
Admission: RE | Disposition: A | Discharge status: Home / Self Care | Payer: Self-pay | Source: Home / Self Care | Attending: Surgery

## 2021-03-23 ENCOUNTER — Ambulatory Visit (HOSPITAL_COMMUNITY): Payer: Commercial Managed Care - PPO | Admitting: Anesthesiology

## 2021-03-23 ENCOUNTER — Other Ambulatory Visit: Payer: Self-pay

## 2021-03-23 ENCOUNTER — Ambulatory Visit (HOSPITAL_COMMUNITY)
Admission: RE | Admit: 2021-03-23 | Discharge: 2021-03-24 | Disposition: A | Discharge status: Home / Self Care | Payer: Commercial Managed Care - PPO | Attending: Surgery | Admitting: Surgery

## 2021-03-23 ENCOUNTER — Encounter (HOSPITAL_COMMUNITY): Payer: Self-pay | Admitting: Surgery

## 2021-03-23 DIAGNOSIS — C561 Malignant neoplasm of right ovary: Secondary | ICD-10-CM | POA: Diagnosis present

## 2021-03-23 DIAGNOSIS — D34 Benign neoplasm of thyroid gland: Secondary | ICD-10-CM | POA: Insufficient documentation

## 2021-03-23 DIAGNOSIS — C569 Malignant neoplasm of unspecified ovary: Secondary | ICD-10-CM | POA: Diagnosis present

## 2021-03-23 DIAGNOSIS — Z8632 Personal history of gestational diabetes: Secondary | ICD-10-CM | POA: Diagnosis not present

## 2021-03-23 DIAGNOSIS — G4730 Sleep apnea, unspecified: Secondary | ICD-10-CM | POA: Diagnosis not present

## 2021-03-23 DIAGNOSIS — Z975 Presence of (intrauterine) contraceptive device: Secondary | ICD-10-CM | POA: Insufficient documentation

## 2021-03-23 DIAGNOSIS — Z8619 Personal history of other infectious and parasitic diseases: Secondary | ICD-10-CM | POA: Diagnosis not present

## 2021-03-23 DIAGNOSIS — Z885 Allergy status to narcotic agent status: Secondary | ICD-10-CM | POA: Insufficient documentation

## 2021-03-23 DIAGNOSIS — Z87891 Personal history of nicotine dependence: Secondary | ICD-10-CM | POA: Diagnosis not present

## 2021-03-23 DIAGNOSIS — M7989 Other specified soft tissue disorders: Secondary | ICD-10-CM | POA: Diagnosis present

## 2021-03-23 DIAGNOSIS — D1721 Benign lipomatous neoplasm of skin and subcutaneous tissue of right arm: Secondary | ICD-10-CM | POA: Diagnosis not present

## 2021-03-23 DIAGNOSIS — I10 Essential (primary) hypertension: Secondary | ICD-10-CM | POA: Insufficient documentation

## 2021-03-23 DIAGNOSIS — Z85850 Personal history of malignant neoplasm of thyroid: Secondary | ICD-10-CM | POA: Insufficient documentation

## 2021-03-23 DIAGNOSIS — Z6837 Body mass index (BMI) 37.0-37.9, adult: Secondary | ICD-10-CM | POA: Insufficient documentation

## 2021-03-23 DIAGNOSIS — C73 Malignant neoplasm of thyroid gland: Secondary | ICD-10-CM | POA: Diagnosis present

## 2021-03-23 HISTORY — PX: MASS EXCISION: SHX2000

## 2021-03-23 HISTORY — PX: THYROIDECTOMY: SHX17

## 2021-03-23 LAB — HCG, SERUM, QUALITATIVE: Preg, Serum: NEGATIVE

## 2021-03-23 SURGERY — THYROIDECTOMY
Anesthesia: General | Site: Neck | Laterality: Right

## 2021-03-23 MED ORDER — HEMOSTATIC AGENTS (NO CHARGE) OPTIME
TOPICAL | Status: DC | PRN
  Administered 2021-03-23: 1 via TOPICAL

## 2021-03-23 MED ORDER — PROMETHAZINE HCL 25 MG/ML IJ SOLN: 6.2500 mg | INTRAMUSCULAR | Status: DC | PRN

## 2021-03-23 MED ORDER — SCOPOLAMINE 1 MG/3DAYS TD PT72
MEDICATED_PATCH | TRANSDERMAL | Status: AC
  Filled 2021-03-23: qty 1

## 2021-03-23 MED ORDER — CHLORHEXIDINE GLUCONATE 0.12 % MT SOLN
15.0000 mL | Freq: Once | OROMUCOSAL | Status: AC
  Administered 2021-03-23: 15 mL via OROMUCOSAL

## 2021-03-23 MED ORDER — ACETAMINOPHEN 325 MG PO TABS: 650.0000 mg | ORAL_TABLET | Freq: Four times a day (QID) | ORAL | Status: DC | PRN

## 2021-03-23 MED ORDER — ONDANSETRON HCL 4 MG/2ML IJ SOLN
INTRAMUSCULAR | Status: AC
  Filled 2021-03-23: qty 2

## 2021-03-23 MED ORDER — LIDOCAINE 2% (20 MG/ML) 5 ML SYRINGE
INTRAMUSCULAR | Status: AC
  Filled 2021-03-23: qty 5

## 2021-03-23 MED ORDER — ORAL CARE MOUTH RINSE: 15.0000 mL | Freq: Once | OROMUCOSAL | Status: AC

## 2021-03-23 MED ORDER — SCOPOLAMINE 1 MG/3DAYS TD PT72
MEDICATED_PATCH | TRANSDERMAL | Status: DC | PRN
  Administered 2021-03-23: 1 via TRANSDERMAL

## 2021-03-23 MED ORDER — CALCIUM CARBONATE 1250 (500 CA) MG PO TABS
2.0000 | ORAL_TABLET | Freq: Three times a day (TID) | ORAL | Status: DC
  Administered 2021-03-24: 1000 mg via ORAL
  Filled 2021-03-23: qty 1

## 2021-03-23 MED ORDER — CHLORHEXIDINE GLUCONATE CLOTH 2 % EX PADS
6.0000 | MEDICATED_PAD | Freq: Once | CUTANEOUS | Status: AC
  Administered 2021-03-23: 6 via TOPICAL

## 2021-03-23 MED ORDER — ROCURONIUM BROMIDE 10 MG/ML (PF) SYRINGE
PREFILLED_SYRINGE | INTRAVENOUS | Status: AC
  Filled 2021-03-23: qty 10

## 2021-03-23 MED ORDER — ONDANSETRON 4 MG PO TBDP: 4.0000 mg | ORAL_TABLET | Freq: Four times a day (QID) | ORAL | Status: DC | PRN

## 2021-03-23 MED ORDER — DIPHENHYDRAMINE HCL 50 MG/ML IJ SOLN
INTRAMUSCULAR | Status: AC
  Filled 2021-03-23: qty 1

## 2021-03-23 MED ORDER — MIDAZOLAM HCL 5 MG/5ML IJ SOLN
INTRAMUSCULAR | Status: DC | PRN
  Administered 2021-03-23: 2 mg via INTRAVENOUS

## 2021-03-23 MED ORDER — FENTANYL CITRATE (PF) 100 MCG/2ML IJ SOLN
INTRAMUSCULAR | Status: AC
  Filled 2021-03-23: qty 2

## 2021-03-23 MED ORDER — KETOROLAC TROMETHAMINE 30 MG/ML IJ SOLN
INTRAMUSCULAR | Status: AC
  Filled 2021-03-23: qty 1

## 2021-03-23 MED ORDER — LACTATED RINGERS IV SOLN: INTRAVENOUS | Status: DC

## 2021-03-23 MED ORDER — OXYCODONE HCL 5 MG PO TABS
5.0000 mg | ORAL_TABLET | ORAL | Status: DC | PRN
  Filled 2021-03-23: qty 2

## 2021-03-23 MED ORDER — DEXAMETHASONE SODIUM PHOSPHATE 10 MG/ML IJ SOLN
INTRAMUSCULAR | Status: DC | PRN
  Administered 2021-03-23: 10 mg via INTRAVENOUS

## 2021-03-23 MED ORDER — ACETAMINOPHEN 10 MG/ML IV SOLN
INTRAVENOUS | Status: AC
  Filled 2021-03-23: qty 100

## 2021-03-23 MED ORDER — KETOROLAC TROMETHAMINE 30 MG/ML IJ SOLN
30.0000 mg | Freq: Once | INTRAMUSCULAR | Status: AC
  Administered 2021-03-23: 30 mg via INTRAVENOUS

## 2021-03-23 MED ORDER — FENTANYL CITRATE PF 50 MCG/ML IJ SOSY
PREFILLED_SYRINGE | INTRAMUSCULAR | Status: AC
  Filled 2021-03-23: qty 3

## 2021-03-23 MED ORDER — MIDAZOLAM HCL 2 MG/2ML IJ SOLN
INTRAMUSCULAR | Status: AC
  Filled 2021-03-23: qty 2

## 2021-03-23 MED ORDER — CEFAZOLIN SODIUM-DEXTROSE 2-4 GM/100ML-% IV SOLN
2.0000 g | INTRAVENOUS | Status: AC
  Administered 2021-03-23: 2 g via INTRAVENOUS
  Filled 2021-03-23: qty 100

## 2021-03-23 MED ORDER — ACETAMINOPHEN 650 MG RE SUPP: 650.0000 mg | Freq: Four times a day (QID) | RECTAL | Status: DC | PRN

## 2021-03-23 MED ORDER — VANCOMYCIN HCL IN DEXTROSE 1-5 GM/200ML-% IV SOLN
1000.0000 mg | INTRAVENOUS | Status: DC
  Filled 2021-03-23: qty 200

## 2021-03-23 MED ORDER — SUGAMMADEX SODIUM 200 MG/2ML IV SOLN
INTRAVENOUS | Status: DC | PRN
  Administered 2021-03-23: 200 mg via INTRAVENOUS

## 2021-03-23 MED ORDER — 0.9 % SODIUM CHLORIDE (POUR BTL) OPTIME
TOPICAL | Status: DC | PRN
  Administered 2021-03-23: 1000 mL

## 2021-03-23 MED ORDER — PROPOFOL 10 MG/ML IV BOLUS
INTRAVENOUS | Status: AC
  Filled 2021-03-23: qty 20

## 2021-03-23 MED ORDER — PROPOFOL 10 MG/ML IV BOLUS
INTRAVENOUS | Status: DC | PRN
  Administered 2021-03-23: 200 mg via INTRAVENOUS

## 2021-03-23 MED ORDER — LIDOCAINE HCL (CARDIAC) PF 100 MG/5ML IV SOSY
PREFILLED_SYRINGE | INTRAVENOUS | Status: DC | PRN
Start: 2021-03-23 — End: 2021-03-23
  Administered 2021-03-23: 60 mg via INTRAVENOUS

## 2021-03-23 MED ORDER — SODIUM CHLORIDE 0.45 % IV SOLN: INTRAVENOUS | Status: DC

## 2021-03-23 MED ORDER — ONDANSETRON HCL 4 MG/2ML IJ SOLN: 4.0000 mg | Freq: Four times a day (QID) | INTRAMUSCULAR | Status: DC | PRN

## 2021-03-23 MED ORDER — FENTANYL CITRATE PF 50 MCG/ML IJ SOSY
25.0000 ug | PREFILLED_SYRINGE | INTRAMUSCULAR | Status: DC | PRN
  Administered 2021-03-23 (×3): 50 ug via INTRAVENOUS

## 2021-03-23 MED ORDER — FENTANYL CITRATE (PF) 100 MCG/2ML IJ SOLN
INTRAMUSCULAR | Status: DC | PRN
  Administered 2021-03-23 (×4): 50 ug via INTRAVENOUS
  Administered 2021-03-23 (×2): 100 ug via INTRAVENOUS

## 2021-03-23 MED ORDER — AMISULPRIDE (ANTIEMETIC) 5 MG/2ML IV SOLN: 10.0000 mg | Freq: Once | INTRAVENOUS | Status: DC | PRN

## 2021-03-23 MED ORDER — TRAMADOL HCL 50 MG PO TABS
50.0000 mg | ORAL_TABLET | Freq: Four times a day (QID) | ORAL | Status: DC | PRN
Start: 2021-03-23 — End: 2021-03-24
  Administered 2021-03-23 – 2021-03-24 (×2): 50 mg via ORAL
  Filled 2021-03-23 (×2): qty 1

## 2021-03-23 MED ORDER — ACETAMINOPHEN 10 MG/ML IV SOLN
1000.0000 mg | Freq: Once | INTRAVENOUS | Status: DC | PRN
  Administered 2021-03-23: 1000 mg via INTRAVENOUS

## 2021-03-23 MED ORDER — HYDROMORPHONE HCL 1 MG/ML IJ SOLN
1.0000 mg | INTRAMUSCULAR | Status: DC | PRN
Start: 2021-03-23 — End: 2021-03-24
  Administered 2021-03-23: 1 mg via INTRAVENOUS
  Filled 2021-03-23: qty 1

## 2021-03-23 MED ORDER — ROCURONIUM BROMIDE 100 MG/10ML IV SOLN
INTRAVENOUS | Status: DC | PRN
  Administered 2021-03-23 (×2): 20 mg via INTRAVENOUS
  Administered 2021-03-23: 40 mg via INTRAVENOUS
  Administered 2021-03-23: 60 mg via INTRAVENOUS

## 2021-03-23 MED ORDER — DIPHENHYDRAMINE HCL 50 MG/ML IJ SOLN
INTRAMUSCULAR | Status: DC | PRN
  Administered 2021-03-23: 12.5 mg via INTRAVENOUS

## 2021-03-23 MED ORDER — IBUPROFEN 200 MG PO TABS: 400.0000 mg | ORAL_TABLET | Freq: Four times a day (QID) | ORAL | Status: DC | PRN

## 2021-03-23 SURGICAL SUPPLY — 52 items
ADH SKN CLS APL DERMABOND .7 (GAUZE/BANDAGES/DRESSINGS) ×6
APL PRP STRL LF DISP 70% ISPRP (MISCELLANEOUS) ×4
ATTRACTOMAT 16X20 MAGNETIC DRP (DRAPES) ×3 IMPLANT
BAG COUNTER SPONGE SURGICOUNT (BAG) ×3 IMPLANT
BAG SPNG CNTER NS LX DISP (BAG) ×2
BLADE SURG 15 STRL LF DISP TIS (BLADE) ×2 IMPLANT
BLADE SURG 15 STRL SS (BLADE) ×3
CHLORAPREP W/TINT 26 (MISCELLANEOUS) ×4 IMPLANT
CLIP TI MEDIUM 6 (CLIP) ×9 IMPLANT
CLIP TI WIDE RED SMALL 6 (CLIP) ×6 IMPLANT
COVER SURGICAL LIGHT HANDLE (MISCELLANEOUS) ×3 IMPLANT
DECANTER SPIKE VIAL GLASS SM (MISCELLANEOUS) IMPLANT
DERMABOND ADVANCED (GAUZE/BANDAGES/DRESSINGS) ×3
DERMABOND ADVANCED .7 DNX12 (GAUZE/BANDAGES/DRESSINGS) ×2 IMPLANT
DRAPE LAPAROSCOPIC ABDOMINAL (DRAPES) IMPLANT
DRAPE LAPAROTOMY T 102X78X121 (DRAPES) IMPLANT
DRAPE LAPAROTOMY T 98X78 PEDS (DRAPES) ×3 IMPLANT
DRAPE LAPAROTOMY TRNSV 102X78 (DRAPES) IMPLANT
DRAPE SHEET LG 3/4 BI-LAMINATE (DRAPES) IMPLANT
DRAPE UTILITY XL STRL (DRAPES) ×3 IMPLANT
ELECT PENCIL ROCKER SW 15FT (MISCELLANEOUS) ×3 IMPLANT
ELECT REM PT RETURN 15FT ADLT (MISCELLANEOUS) ×3 IMPLANT
GAUZE 4X4 16PLY ~~LOC~~+RFID DBL (SPONGE) ×3 IMPLANT
GAUZE SPONGE 4X4 12PLY STRL (GAUZE/BANDAGES/DRESSINGS) IMPLANT
GLOVE SURG ORTHO LTX SZ8 (GLOVE) ×3 IMPLANT
GLOVE SURG SYN 7.5  E (GLOVE) ×6
GLOVE SURG SYN 7.5 E (GLOVE) ×4 IMPLANT
GLOVE SURG SYN 7.5 PF PI (GLOVE) ×4 IMPLANT
GLOVE SURG UNDER POLY LF SZ7 (GLOVE) ×3 IMPLANT
GOWN STRL REUS W/TWL XL LVL3 (GOWN DISPOSABLE) ×6 IMPLANT
HEMOSTAT SURGICEL 2X4 FIBR (HEMOSTASIS) ×3 IMPLANT
ILLUMINATOR WAVEGUIDE N/F (MISCELLANEOUS) ×3 IMPLANT
KIT BASIN OR (CUSTOM PROCEDURE TRAY) ×3 IMPLANT
KIT TURNOVER KIT A (KITS) ×3 IMPLANT
MARKER SKIN DUAL TIP RULER LAB (MISCELLANEOUS) IMPLANT
NDL HYPO 25X1 1.5 SAFETY (NEEDLE) ×2 IMPLANT
NEEDLE HYPO 25X1 1.5 SAFETY (NEEDLE) ×3 IMPLANT
NS IRRIG 1000ML POUR BTL (IV SOLUTION) ×3 IMPLANT
PACK BASIC VI WITH GOWN DISP (CUSTOM PROCEDURE TRAY) ×3 IMPLANT
PACK GENERAL/GYN (CUSTOM PROCEDURE TRAY) ×3 IMPLANT
SHEARS HARMONIC 9CM CVD (BLADE) ×2 IMPLANT
SPONGE T-LAP 4X18 ~~LOC~~+RFID (SPONGE) IMPLANT
STAPLER VISISTAT 35W (STAPLE) IMPLANT
STRIP CLOSURE SKIN 1/2X4 (GAUZE/BANDAGES/DRESSINGS) IMPLANT
SUT MNCRL AB 3-0 PS2 18 (SUTURE) ×1 IMPLANT
SUT MNCRL AB 4-0 PS2 18 (SUTURE) ×5 IMPLANT
SUT VIC AB 3-0 SH 18 (SUTURE) ×7 IMPLANT
SYR BULB IRRIG 60ML STRL (SYRINGE) ×3 IMPLANT
SYR CONTROL 10ML LL (SYRINGE) ×3 IMPLANT
TOWEL OR 17X26 10 PK STRL BLUE (TOWEL DISPOSABLE) ×3 IMPLANT
TOWEL OR NON WOVEN STRL DISP B (DISPOSABLE) ×3 IMPLANT
TUBING CONNECTING 10 (TUBING) ×3 IMPLANT

## 2021-03-23 NOTE — Op Note (Addendum)
Procedure Note  Pre-operative Diagnosis:  1. History of malignant struma ovarii (papillary thyroid carcinoma, follicular variant)  2. Soft tissue mass posterior right shoulder  Post-operative Diagnosis:  same  Surgeon:  Armandina Gemma, MD  Assistant:  none   Procedure:  1. Total thyroidectomy  2. Excision of soft tissue tumor right posterior shoulder, 16 x 10 x 5 cm, subcutaneous  Anesthesia:  General  Estimated Blood Loss:  minimal  Drains: none         Specimen: thyroid to pathology  Indications:  Patient is referred by Dr. 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 Patient had experienced irregular menses in January 2022. She underwent evaluation including a pelvic ultrasound, CT scan, and MRI. Patient ultimately came to surgery with Dr. Everitt Amber and underwent right oophorectomy and removal of a cyst from the left ovary. 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 Patient has been seen by Dr. Buddy Duty and plans are being made for treatment with radioactive iodine. In order to successfully treat the ovarian disease, she will need to have thyroidectomy to remove the normal thyroid tissue. It would be extremely rare to have primary disease within the thyroid. Patient did have a thyroid ultrasound performed at [facility].   Patient has no prior history of thyroid disease. She has never been on thyroid medication. She has had no prior head or neck surgery. There is no family history of thyroid disease.  Patient also notes a soft tissue mass on the right posterior shoulder overlying the scapula. She had undergone evaluation for this in anticipation of surgical excision. Ultrasound was performed on December 20, 2020. This showed a 12 x 4 x 9 cm well-circumscribed solid mass consistent with lipoma. Patient was seen by Dr. Blake Divine at [facility] for  possible excision. The patient would like to undergo concurrent excision at the time of her thyroid surgery.  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  Procedure Details: Procedure was done in OR #1 at the [facility]. The patient was brought to the operating room and placed in a supine position on the operating room table. Following administration of general anesthesia, the patient was turned to a left lateral decubitus position and properly padded and secured on the beanbag.  There was a large soft tissue mass on the posterior right shoulder.  Skin was prepped and draped in the usual aseptic fashion.  After a timeout and assuring that an adequate level of anesthesia been achieved, an incision is made transversely over the mass with a #15 blade.  Dissection was carried through subcutaneous tissues using the electrocautery for hemostasis.  The mass is identified in the deep subcutaneous tissues.  It appears to be a multilobulated lipoma.  Using the electrocautery the mass is completely excised down to the underlying muscle fascia.  It is excised in its entirety.  It measures 16 x 10 x 5 cm in dimension and is submitted to pathology for review.  Wound is irrigated with warm saline and good hemostasis is noted.  Subcutaneous tissues are closed with interrupted 3-0 Vicryl sutures.  Skin is closed with a running 3-0 Monocryl subcuticular suture.  Wound is washed and dried and Dermabond is applied as dressing.  Next the patient is taken out of the right lateral decubitus position and returned to a supine  position on the operating room table.  Another timeout was conducted for the thyroidectomy portion of the procedure.  The patient was positioned and then prepped and draped in the usual aseptic fashion. After ascertaining that an adequate level of anesthesia had been achieved, a small Kocher incision was made with #15 blade. Dissection was carried  through subcutaneous tissues and platysma.Hemostasis was achieved with the electrocautery. Skin flaps were elevated cephalad and caudad from the thyroid notch to the sternal notch. A [place]ner self-retaining retractor was placed for exposure. Strap muscles were incised in the midline and dissection was begun on the left side.  Strap muscles were reflected laterally.  Left thyroid lobe was normal in size without nodules.  The left lobe was gently mobilized with blunt dissection. Superior pole vessels were dissected out and divided individually between small and medium ligaclips with the harmonic scalpel. The thyroid lobe was rolled anteriorly. Branches of the inferior thyroid artery were divided between small ligaclips with the harmonic scalpel. Inferior venous tributaries were divided between ligaclips. Both the superior and inferior parathyroid glands were identified and preserved on their vascular pedicles. The recurrent laryngeal nerve was identified and preserved along its course. The ligament of Gwenlyn Found was released with the electrocautery and the gland was mobilized onto the anterior trachea. Isthmus was mobilized across the midline. There was a small pyramidal lobe present  which was resected with the isthmus. Dry pack was placed in the left neck.  The right thyroid lobe was gently mobilized with blunt dissection. Right thyroid lobe was normal in size without nodules. Superior pole vessels were dissected out and divided between small and medium ligaclips with the Harmonic scalpel. Superior parathyroid was identified and preserved. Inferior venous tributaries were divided between medium ligaclips with the harmonic scalpel. The right thyroid lobe was rolled anteriorly and the branches of the inferior thyroid artery divided between small ligaclips. The right recurrent laryngeal nerve was identified and preserved along its course. The inferior parathyroid was identified and preserved on its vascular pedicle. The  ligament of Gwenlyn Found was released with the electrocautery. The right thyroid lobe was mobilized onto the anterior trachea and the remainder of the thyroid was dissected off the anterior trachea and the thyroid was completely excised. A suture was used to mark the left lobe. The entire thyroid gland was submitted to pathology for review.  The neck was explored for lymphadenopathy.  The ultrasound finding appears to be a normal, soft lymph node in the right thyroid bed which is intimately associated with the superior parathyroid and recurrent nerve.  There is no evidence of tumor.  The node is left in situ.  The neck was irrigated with warm saline. Fibrillar was placed throughout the operative field. Strap muscles were approximated in the midline with interrupted 3-0 Vicryl sutures. Platysma was closed with interrupted 3-0 Vicryl sutures. Skin was closed with a running 4-0 Monocryl subcuticular suture. Wound was washed and Dermabond was applied. The patient was awakened from anesthesia and brought to the recovery room. The patient tolerated the procedure well.   Armandina Gemma, MD [facility] Office: [phone] 1.

## 2021-03-23 NOTE — Transfer of Care (Signed)
Immediate Anesthesia Transfer of Care Note  Patient: Marissa Lane  Procedure(s) Performed: TOTAL THYROIDECTOMY (Neck) EXCISION SOFT TISSUE MASS ON RIGHT SHOULDER (Right: Back)  Patient Location: PACU  Anesthesia Type:General  Level of Consciousness: awake and alert   Airway & Oxygen Therapy: Patient Spontanous Breathing, Patient connected to face mask oxygen and c/o pain.  Post-op Assessment: Report given to RN and Post -op Vital signs reviewed and stable  Post vital signs: Reviewed and stable  Last Vitals:  Vitals Value Taken Time  BP 163/109 03/23/21 1605  Temp    Pulse 112 03/23/21 1608  Resp 16 03/23/21 1608  SpO2 100 % 03/23/21 1608  Vitals shown include unvalidated device data.  Last Pain:  Vitals:   03/23/21 1103  TempSrc: Oral  PainSc: 0-No pain         Complications: No notable events documented.

## 2021-03-23 NOTE — Anesthesia Postprocedure Evaluation (Signed)
Anesthesia Post Note  Patient: Tonnette Zatorski  Procedure(s) Performed: TOTAL THYROIDECTOMY (Neck) EXCISION SOFT TISSUE MASS ON RIGHT SHOULDER (Right: Back)     Patient location during evaluation: PACU Anesthesia Type: General Level of consciousness: awake Pain management: pain level controlled Vital Signs Assessment: post-procedure vital signs reviewed and stable Respiratory status: spontaneous breathing, nonlabored ventilation, respiratory function stable and patient connected to nasal cannula oxygen Cardiovascular status: blood pressure returned to baseline and stable Postop Assessment: no apparent nausea or vomiting Anesthetic complications: no   No notable events documented.  Last Vitals:  Vitals:   03/23/21 1800 03/23/21 1831  BP: (!) 142/80 (!) 148/102  Pulse: 78 82  Resp: 17 18  Temp:  36.8 C  SpO2: 100% 99%    Last Pain:  Vitals:   03/23/21 1849  TempSrc:   PainSc: 6                  Jenafer Winterton P Hollee Fate

## 2021-03-23 NOTE — Anesthesia Procedure Notes (Signed)
Procedure Name: Intubation Date/Time: 03/23/2021 1:18 PM Performed by: [place] Breeanna Galgano C, CRNA Pre-anesthesia Checklist: Patient identified, Emergency Drugs available, Suction available and Patient being monitored Patient Re-evaluated:Patient Re-evaluated prior to induction Oxygen Delivery Method: Circle system utilized Preoxygenation: Pre-oxygenation with 100% oxygen Induction Type: IV induction Ventilation: Mask ventilation without difficulty Laryngoscope Size: Mac and 3 Grade View: Grade II Tube type: Oral Tube size: 7.0 mm Number of attempts: 1 Airway Equipment and Method: Stylet and Oral airway Placement Confirmation: ETT inserted through vocal cords under direct vision, positive ETCO2 and breath sounds checked- equal and bilateral Secured at: 21 cm Tube secured with: Tape Dental Injury: Teeth and Oropharynx as per pre-operative assessment

## 2021-03-23 NOTE — Interval H&P Note (Signed)
History and Physical Interval Note:  03/23/2021 12:30 PM  Marissa Lane  has presented today for surgery, with the diagnosis of PAPILLARY THYCARCINOMA, STRUMA OVARII, SOFT TISSUE MASS RIGHT SHOULDER.  The various methods of treatment have been discussed with the patient and family. After consideration of risks, benefits and other options for treatment, the patient has consented to    Procedure(s): TOTAL THYROIDECTOMY (N/A) EXCISION SOFT TISSUE MASS ON RIGHT SHOULDER (Right) as a surgical intervention.    The patient's history has been reviewed, patient examined, no change in status, stable for surgery.  I have reviewed the patient's chart and labs.  Questions were answered to the patient's satisfaction.    Armandina Gemma, [facility] Office: [place]

## 2021-03-23 NOTE — Anesthesia Preprocedure Evaluation (Signed)
Anesthesia Evaluation  Patient identified by MRN, date of birth, ID band Patient awake    Reviewed: Allergy & Precautions, NPO status , Patient's Chart, lab work & pertinent test results  Airway Mallampati: II  TM Distance: >3 FB Neck ROM: Full    Dental no notable dental hx.    Pulmonary Current Smoker and Patient abstained from smoking.,    Pulmonary exam normal breath sounds clear to auscultation       Cardiovascular hypertension, Normal cardiovascular exam Rhythm:Regular Rate:Normal     Neuro/Psych  Headaches, PSYCHIATRIC DISORDERS Anxiety Depression    GI/Hepatic Neg liver ROS, PUD,   Endo/Other  PCOS (polycystic ovarian syndrome)  Renal/GU negative Renal ROS     Musculoskeletal negative musculoskeletal ROS (+)   Abdominal   Peds  Hematology negative hematology ROS (+)   Anesthesia Other Findings PAPILLARY THYCARCINOMA, STRUMA OVARII, SOFT TISSUE MASS RIGHT SHOULDER  Reproductive/Obstetrics hcg negative                             Anesthesia Physical Anesthesia Plan  ASA: 2  Anesthesia Plan: General   Post-op Pain Management:    Induction: Intravenous  PONV Risk Score and Plan: 3 and Ondansetron, Dexamethasone, Midazolam and Treatment may vary due to age or medical condition  Airway Management Planned: Oral ETT  Additional Equipment:   Intra-op Plan:   Post-operative Plan: Extubation in OR  Informed Consent: I have reviewed the patients History and Physical, chart, labs and discussed the procedure including the risks, benefits and alternatives for the proposed anesthesia with the patient or authorized representative who has indicated his/her understanding and acceptance.     Dental advisory given  Plan Discussed with: CRNA  Anesthesia Plan Comments:         Anesthesia Quick Evaluation

## 2021-03-23 NOTE — Plan of Care (Signed)
  Problem: Activity: Goal: Risk for activity intolerance will decrease Outcome: Progressing   Problem: Elimination: Goal: Will not experience complications related to bowel motility Outcome: Progressing   Problem: Pain Managment: Goal: General experience of comfort will improve Outcome: Progressing   Problem: Elimination: Goal: Will not experience complications related to urinary retention Outcome: Progressing   Problem: Skin Integrity: Goal: Risk for impaired skin integrity will decrease Outcome: Progressing   Problem: Safety: Goal: Ability to remain free from injury will improve Outcome: Progressing

## 2021-03-24 ENCOUNTER — Encounter (HOSPITAL_COMMUNITY): Payer: Self-pay | Admitting: Surgery

## 2021-03-24 DIAGNOSIS — C561 Malignant neoplasm of right ovary: Secondary | ICD-10-CM | POA: Diagnosis not present

## 2021-03-24 LAB — BASIC METABOLIC PANEL
Anion gap: 7 (ref 5–15)
BUN: 10 mg/dL (ref 6–20)
CO2: 24 mmol/L (ref 22–32)
Calcium: 8.9 mg/dL (ref 8.9–10.3)
Chloride: 108 mmol/L (ref 98–111)
Creatinine, Ser: 0.39 mg/dL — ABNORMAL LOW (ref 0.44–1.00)
GFR, Estimated: >60 mL/min (ref >60)
Glucose, Bld: 145 mg/dL — ABNORMAL HIGH (ref 70–99)
Potassium: 4.1 mmol/L (ref 3.5–5.1)
Sodium: 139 mmol/L (ref 135–145)

## 2021-03-24 MED ORDER — TRAMADOL HCL 50 MG PO TABS: 50.0000 mg | ORAL_TABLET | Freq: Four times a day (QID) | ORAL | 0 refills | Status: DC | PRN

## 2021-03-24 MED ORDER — CALCIUM CARBONATE ANTACID 500 MG PO CHEW: 2.0000 | CHEWABLE_TABLET | Freq: Two times a day (BID) | ORAL | 1 refills | Status: DC

## 2021-03-24 NOTE — Discharge Instructions (Signed)
[facility]  THYROID & PARATHYROID SURGERY:  POST-OP INSTRUCTIONS  Always review your discharge instruction sheet from the facility where your surgery was performed.  A prescription for pain medication may be given to you upon discharge.  Take your pain medication as prescribed.  If narcotic pain medicine is not needed, then you may take acetaminophen (Tylenol) or ibuprofen (Advil) as needed.  Take your usually prescribed medications unless otherwise directed.  If you need a refill on your pain medication, please contact our office during regular business hours.  Prescriptions cannot be processed by our office after 5 pm or on weekends.  Start with a light diet upon arrival home, such as soup and crackers or toast.  Be sure to drink plenty of fluids daily.  Resume your normal diet the day after surgery.  Most patients will experience some swelling and bruising on the chest and neck area.  Ice packs will help.  Swelling and bruising can take several days to resolve.   It is common to experience some constipation after surgery.  Increasing fluid intake and taking a stool softener (Colace) will usually help or prevent this problem.  A mild laxative (Milk of Magnesia or Miralax) should be taken according to package directions if there has been no bowel movement after 48 hours.  You have steri-strips and a gauze dressing over your incision.  You may remove the gauze bandage on the second day after surgery, and you may shower at that time.  Leave your steri-strips (small skin tapes) in place directly over the incision.  These strips should remain on the skin for 5-7 days and then be removed.  You may get them wet in the shower and pat them dry.  You may resume regular (light) daily activities beginning the next day (such as daily self-care, walking, climbing stairs) gradually increasing activities as tolerated.  You may have sexual intercourse when it is comfortable.  Refrain from  any heavy lifting or straining until approved by your doctor.  You may drive when you no longer are taking prescription pain medication, you can comfortably wear a seatbelt, and you can safely maneuver your car and apply brakes.  You should see your doctor in the office for a follow-up appointment approximately three weeks after your surgery.  Make sure that you call for this appointment within a day or two after you arrive home to insure a convenient appointment time.  WHEN TO CALL YOUR DOCTOR: -- Fever greater than 101.5 -- Inability to urinate -- Nausea and/or vomiting - persistent -- Extreme swelling or bruising -- Continued bleeding from incision -- Increased pain, redness, or drainage from the incision -- Difficulty swallowing or breathing -- Muscle cramping or spasms -- Numbness or tingling in hands or around lips  The clinic staff is available to answer your questions during regular business hours.  Please don't hesitate to call and ask to speak to one of the nurses if you have concerns.  Heloise Gordan, MD [facility] Office: [phone] 

## 2021-03-24 NOTE — Discharge Summary (Signed)
Physician Discharge Summary   Patient ID: Marissa Lane MRN: [id number] DOB/AGE: [birth year] 30 y.o.  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  Post op course was uncomplicated.  Pain was well controlled.  Tolerated diet.  Post op calcium level on morning following surgery was 8.9 mg/dl.  Patient was prepared for discharge home on POD#1.   Consults: None  Treatments: surgery: total thyroidectomy, excision of mass right shoulder  Discharge Exam: Blood pressure 116/72, pulse 75, temperature 97.6 F (36.4 C), temperature source Oral, resp. rate 20, height '5\' 4"'$  (1.626 m), weight 98.5 kg, SpO2 100 %. HEENT - clear Neck - wound dry and intact; minimal STS; voice near normal Chest - clear bilaterally Cor - RRR Back - wound dry and intact without seroma  Disposition: Home  Discharge Instructions     Diet - low sodium heart healthy   Complete by: As directed    Ice pack   Complete by: As directed    Increase activity slowly   Complete by: As directed    No dressing needed   Complete by: As directed       Allergies as of 03/24/2021       Reactions   Bupropion Other (See Comments)   WORSENING DEPRESSION   Cymbalta [duloxetine Hcl] Other (See Comments)   Worsening depression   Oxycodone Itching   Silicone Itching   Contacts-eyes itching   Vicodin [hydrocodone-acetaminophen] Other (See Comments)   Bad migraine   Zofran [ondansetron Hcl] Itching        Medication List     TAKE these medications    acetaminophen 325 MG tablet Commonly known as: TYLENOL Take 650 mg by mouth every 6 (six) hours as needed for moderate pain.   calcium  carbonate 500 MG chewable tablet Commonly known as: Tums Chew 2 tablets (400 mg of elemental calcium total) by mouth 2 (two) times daily.   clonazePAM 0.5 MG tablet Commonly known as: KLONOPIN Take 1/2 tab po BID prn severe anxiety   ibuprofen 800 MG tablet Commonly known as: ADVIL Take 1 tablet (800 mg total) by mouth every 8 (eight) hours as needed for moderate pain. For AFTER surgery only   levonorgestrel 20 MCG/24HR IUD Commonly known as: MIRENA 1 each by Intrauterine route once.   traMADol 50 MG tablet Commonly known as: ULTRAM Take 1-2 tablets (50-100 mg total) by mouth every 6 (six) hours as needed for moderate pain.               Discharge Care Instructions  (From admission, onward)           Start     Ordered   03/24/21 0000  No dressing needed        03/24/21 [id number]            Follow-up Information     Armandina Gemma, MD. Schedule an appointment as soon as possible for a visit in 3 week(s).   Specialty: General Surgery Why: For wound re-check Contact information: [address] [place] [address] [phone]                 Armandina Gemma, MD [facility] Office: [phone]   Signed: Armandina Gemma 03/24/2021, 8:22 AM

## 2021-03-24 NOTE — Progress Notes (Signed)
Patient given discharge, follow up, and medication instructions, verbalized understanding, IV removed, personal belongings with patient, family to transport home  

## 2021-03-27 ENCOUNTER — Encounter (HOSPITAL_COMMUNITY): Payer: Self-pay

## 2021-03-27 ENCOUNTER — Other Ambulatory Visit: Payer: Self-pay

## 2021-03-27 ENCOUNTER — Emergency Department (HOSPITAL_COMMUNITY): Payer: Commercial Managed Care - PPO

## 2021-03-27 DIAGNOSIS — R0789 Other chest pain: Secondary | ICD-10-CM | POA: Insufficient documentation

## 2021-03-27 DIAGNOSIS — R202 Paresthesia of skin: Secondary | ICD-10-CM | POA: Insufficient documentation

## 2021-03-27 DIAGNOSIS — R110 Nausea: Secondary | ICD-10-CM | POA: Insufficient documentation

## 2021-03-27 DIAGNOSIS — I10 Essential (primary) hypertension: Secondary | ICD-10-CM | POA: Insufficient documentation

## 2021-03-27 DIAGNOSIS — E119 Type 2 diabetes mellitus without complications: Secondary | ICD-10-CM | POA: Diagnosis not present

## 2021-03-27 DIAGNOSIS — Z85850 Personal history of malignant neoplasm of thyroid: Secondary | ICD-10-CM | POA: Insufficient documentation

## 2021-03-27 DIAGNOSIS — F17210 Nicotine dependence, cigarettes, uncomplicated: Secondary | ICD-10-CM | POA: Diagnosis not present

## 2021-03-27 NOTE — ED Triage Notes (Signed)
Pov from home with cc of chest pain. That goes on her left side arm and jaw. Says its also tingly  Had thyroid surgery Friday, removed completely and a place removed on her shoulder

## 2021-03-28 ENCOUNTER — Emergency Department (HOSPITAL_COMMUNITY)
Admission: EM | Admit: 2021-03-28 | Discharge: 2021-03-28 | Disposition: A | Discharge status: Home / Self Care | Payer: Commercial Managed Care - PPO | Attending: Emergency Medicine | Admitting: Emergency Medicine

## 2021-03-28 DIAGNOSIS — R0789 Other chest pain: Secondary | ICD-10-CM

## 2021-03-28 DIAGNOSIS — R202 Paresthesia of skin: Secondary | ICD-10-CM

## 2021-03-28 LAB — BASIC METABOLIC PANEL
Anion gap: 7 (ref 5–15)
BUN: 8 mg/dL (ref 6–20)
CO2: 26 mmol/L (ref 22–32)
Calcium: 9.1 mg/dL (ref 8.9–10.3)
Chloride: 104 mmol/L (ref 98–111)
Creatinine, Ser: 0.63 mg/dL (ref 0.44–1.00)
GFR, Estimated: >60 mL/min (ref >60)
Glucose, Bld: 114 mg/dL — ABNORMAL HIGH (ref 70–99)
Potassium: 4.2 mmol/L (ref 3.5–5.1)
Sodium: 137 mmol/L (ref 135–145)

## 2021-03-28 LAB — CBC
HCT: 40.2 % (ref 36.0–46.0)
Hemoglobin: 13.9 g/dL (ref 12.0–15.0)
MCH: 31.6 pg (ref 26.0–34.0)
MCHC: 34.6 g/dL (ref 30.0–36.0)
MCV: 91.4 fL (ref 80.0–100.0)
Platelets: 333 10*3/uL (ref 150–400)
RBC: 4.4 MIL/uL (ref 3.87–5.11)
RDW: 12.3 % (ref 11.5–15.5)
WBC: 10 10*3/uL (ref 4.0–10.5)
nRBC: 0 % (ref 0.0–0.2)

## 2021-03-28 LAB — TROPONIN I (HIGH SENSITIVITY)
Troponin I (High Sensitivity): <2 ng/L (ref <18)
Troponin I (High Sensitivity): <2 ng/L (ref <18)

## 2021-03-28 LAB — SURGICAL PATHOLOGY

## 2021-03-28 NOTE — Discharge Instructions (Addendum)
Your evaluation today did not show any sign of any heart or lung problem.  However, if your symptoms are getting worse, please return for further evaluation.

## 2021-03-28 NOTE — Progress Notes (Signed)
All pathology results are benign.  Good news, as expected.  [place], MD [facility] [place] [facility] Office: [phone]

## 2021-03-28 NOTE — ED Provider Notes (Signed)
[facility] EMERGENCY DEPARTMENT Provider Note   CSN: [id number] Arrival date & time: 03/27/21  2227     History Chief Complaint  Patient presents with   Chest Pain    Marissa Lane is a 30 y.o. female.  The history is provided by the patient.  Chest Pain She has history of gestational diabetes and gestational hypertension and recently diagnosed thyroid cancer in struma ovarii and is 5 days status post thyroidectomy.  She comes in tonight because of a vague chest discomfort with associated paresthesias of her left arm and a feeling like the left side of her face was scrunching up.  This started this evening just prior to her coming to the ED.  She denies any true chest pain and symptoms are not worse with deep breath.  She denies any dyspnea.  She has been having some ongoing problems with nausea, but this has not changed.  She denies any diaphoresis.  Symptoms are mostly resolved although she still has some mild paresthesias in the left left arm.  She is a cigarette smoker but denies history of hyperlipidemia.  Mother died in her mid 41s of congestive heart failure, but it is not known if she had obstructive coronary disease.  Past Medical History:  Diagnosis Date   Acne    Anxiety    Cancer ([place])    thyroid   Chest pain    Chronic hypertension    Diabetes mellitus without complication (HCC)    Dyspnea    Dysthymic disorder    over dose age 20   GERD (gastroesophageal reflux disease)    Gestational diabetes    metformin   H/O urinary frequency 06/25/2013   Irregular intermenstrual bleeding 10/12/2013   Lipoma    right scapula   Obesity    Palpitations    PCOS (polycystic ovarian syndrome)    Postpartum depression    Tobacco abuse     Patient Active Problem List   Diagnosis Date Noted   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[place]) 03/23/2021   Mass of soft tissue of shoulder 03/20/2021   Papillary thyroid carcinoma ([place]) 03/20/2021   Lipoma of back 01/18/2021   Malignant struma  ovarii of right ovary ([place]) 01/08/2021   Cyst of right ovary 01/02/2021   Cyst of ovary, left 11/02/2020   Encounter for IUD insertion 11/02/2020   Pregnancy examination or test, negative result 11/02/2020   GAD (generalized anxiety disorder) 08/24/2019   Attention deficit hyperactivity disorder (ADHD), 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   Cervical high risk HPV (human papillomavirus) test positive 01/09/2017   History of gestational diabetes 07/19/2016   Chronic hypertension 04/30/2016   Smoker 03/05/2016   Migraine without aura and with status migrainosus, not intractable 04/06/2015   Anxiety 09/21/2013   PCOS (polycystic ovarian syndrome) 12/11/2012   Erosive esophagitis 11/05/2012   Morbid obesity ([place]) 09/14/2010    Past Surgical History:  Procedure Laterality Date   CHOLECYSTECTOMY  03/06/2012   Procedure: LAPAROSCOPIC CHOLECYSTECTOMY;  Surgeon: Donato Heinz, MD;  Location: [facility] ORS;  Service: General;  Laterality: N/A;   ESOPHAGOGASTRODUODENOSCOPY N/A 09/09/2012   Procedure: ESOPHAGOGASTRODUODENOSCOPY (EGD);  Surgeon: Rogene Houston, MD;  Location: [facility] ENDO SUITE;  Service: Endoscopy;  Laterality: N/A;  340   MASS EXCISION Right 03/23/2021   Procedure: EXCISION SOFT TISSUE MASS ON RIGHT SHOULDER;  Surgeon: Armandina Gemma, MD;  Location: [facility] ORS;  Service: General;  Laterality: Right;   MOUTH SURGERY     ROBOTIC ASSISTED LAPAROSCOPIC OVARIAN CYSTECTOMY Left 01/02/2021  Procedure: XI ROBOTIC ASSISTED LEFT LAPAROSCOPIC OVARIAN CYSTECTOMY;  Surgeon: Everitt Amber, MD;  Location: [facility] ORS;  Service: Gynecology;  Laterality: Left;   ROBOTIC ASSISTED SALPINGO OOPHERECTOMY Right 01/02/2021   Procedure: XI ROBOTIC ASSISTED RIGHT SALPINGO OOPHORECTOMY;  Surgeon: Everitt Amber, MD;  Location: [facility] ORS;  Service: Gynecology;  Laterality: Right;   SKIN GRAFT  2009   gum graft   THYROIDECTOMY N/A 03/23/2021   Procedure: TOTAL THYROIDECTOMY;  Surgeon: Armandina Gemma, MD;  Location: [facility]S;  Service: General;   Laterality: N/A;   TONSILLECTOMY       OB History     Gravida  3   Para  3   Term  3   Preterm      AB      Living  3      SAB      IAB      Ectopic      Multiple  0   Live Births  3           Family History  Adopted: Yes  Problem Relation Age of Onset   Congestive Heart Failure Mother    Irritable bowel syndrome Mother    Ulcers Mother    Anemia Mother    Drug abuse Mother    Hypertension Brother    Colon cancer Maternal Grandmother 44   Anxiety disorder Maternal Grandmother    Heart disease Maternal Grandfather 30       MI    Social History   Tobacco Use   Smoking status: Every Day    Packs/day: 1.00    Years: 10.00    Pack years: 10.00    Types: Cigarettes   Smokeless tobacco: Never  Vaping Use   Vaping Use: Never used  Substance Use Topics   Alcohol use: No   Drug use: No    Home Medications Prior to Admission medications   Medication Sig Start Date End Date Taking? Authorizing Provider  acetaminophen (TYLENOL) 325 MG tablet Take 650 mg by mouth every 6 (six) hours as needed for moderate pain.    [provider]  calcium carbonate (TUMS) 500 MG chewable tablet Chew 2 tablets (400 mg of elemental calcium total) by mouth 2 (two) times daily. 03/24/21   Armandina Gemma, MD  clonazePAM (KLONOPIN) 0.5 MG tablet Take 1/2 tab po BID prn severe anxiety Patient not taking: Reported on 03/09/2021 08/21/19   Nilda Simmer, NP  ibuprofen (ADVIL) 800 MG tablet Take 1 tablet (800 mg total) by mouth every 8 (eight) hours as needed for moderate pain. For AFTER surgery only Patient not taking: Reported on 03/09/2021 12/12/20   Joylene John D, NP  levonorgestrel (MIRENA) 20 MCG/24HR IUD 1 each by Intrauterine route once.    [provider]  traMADol (ULTRAM) 50 MG tablet Take 1-2 tablets (50-100 mg total) by mouth every 6 (six) hours as needed for moderate pain. 03/24/21   Armandina Gemma, MD    Allergies    Bupropion, Cymbalta [duloxetine  hcl], Oxycodone, Silicone, Vicodin [hydrocodone-acetaminophen], and Zofran [ondansetron hcl]  Review of Systems   Review of Systems  Cardiovascular:  Positive for chest pain.  All other systems reviewed and are negative.  Physical Exam Updated Vital Signs BP (!) 107/93   Pulse 88   Temp 98.1 F (36.7 C)   Resp 18   Ht '5\' 4"'$  (1.626 m)   Wt 98.5 kg   LMP 03/21/2021 (Approximate)   SpO2 100%   BMI 37.27 kg/m  Physical Exam Vitals and nursing note reviewed.  30 year old female, resting comfortably and in no acute distress. Vital signs are normal. Oxygen saturation is 100%, which is normal. Head is normocephalic and atraumatic. PERRLA, EOMI. Oropharynx is clear. Neck is nontender and supple without adenopathy or JVD. Back is nontender and there is no CVA tenderness.  Area in the right suprascapular area which is fluctuant but nontender with no erythema or warmth Lungs are clear without rales, wheezes, or rhonchi. Chest is nontender. Heart has regular rate and rhythm without murmur. Abdomen is soft, flat, nontender without masses or hepatosplenomegaly and peristalsis is normoactive. Extremities have no cyanosis or edema, full range of motion is present. Skin is warm and dry without rash. Neurologic: Mental status is normal, cranial nerves are intact, there are no motor or sensory deficits.  Negative Chvostek's Sign.  Strength is 5/5 in all muscle groups.  No areas of decreased sensation in the left arm or face.  ED Results / Procedures / Treatments   Labs (all labs ordered are listed, but only abnormal results are displayed) Labs Reviewed  BASIC METABOLIC PANEL - Abnormal; Notable for the following components:      Result Value   Glucose, Bld 114 (*)    All other components within normal limits  CBC  TROPONIN I (HIGH SENSITIVITY)  TROPONIN I (HIGH SENSITIVITY)    EKG EKG Interpretation  Date/Time:  Tuesday March 27 2021 22:58:33 EDT Ventricular Rate:  87 PR  Interval:  158 QRS Duration: 94 QT Interval:  350 QTC Calculation: 421 R Axis:   77 Text Interpretation: Normal sinus rhythm Normal ECG When compared with ECG of 12/27/2020, No significant change was found Confirmed by Delora Fuel ([id number]) on 03/27/2021 11:09:30 PM  Radiology DG Chest 2 View  Result Date: 03/27/2021 CLINICAL DATA:  Chest pain EXAM: CHEST - 2 VIEW COMPARISON:  03/14/2021 FINDINGS: No acute airspace disease or effusion. Normal heart size. No pneumothorax. Air-fluid level over the right lateral chest, appears to localize to posterior soft tissues. IMPRESSION: 1. No radiographic evidence for acute cardiothoracic abnormality 2. Air-fluid level and cystic lesion over the right posterior chest wall soft tissues, may correspond to history right posterior shoulder mass removal, correlate with physical exam. Electronically Signed   By: Donavan Foil M.D.   On: 03/27/2021 23:21    Procedures Procedures   Medications Ordered in ED Medications - No data to display  ED Course  I have reviewed the triage vital signs and the nursing notes.  Pertinent labs & imaging results that were available during my care of the patient were reviewed by me and considered in my medical decision making (see chart for details).  MDM Rules/Calculators/A&P                         Atypical chest pain with paresthesias of the left arm.  Exam is benign.  There is a cystic area in the right suprascapular area where a lipoma was recently removed.  She is at risk for pulmonary embolism being several days postoperative, but has normal vital signs and no symptoms that are suggestive of pulmonary embolism.  ECG is normal, and troponin is normal x2.  Calcium is normal.  She is felt to be safe for discharge with outpatient follow-up with her primary care provider and endocrinologist.  Final Clinical Impression(s) / ED Diagnoses Final diagnoses:  Atypical chest pain  Paresthesia of left arm    Rx / DC  Orders ED  Discharge Orders     None        Delora Fuel, MD [id number] [phone]

## 2021-04-17 ENCOUNTER — Other Ambulatory Visit (HOSPITAL_COMMUNITY): Payer: Self-pay | Admitting: Internal Medicine

## 2021-04-17 ENCOUNTER — Other Ambulatory Visit: Payer: Self-pay | Admitting: Women's Health

## 2021-04-17 DIAGNOSIS — Z13220 Encounter for screening for lipoid disorders: Secondary | ICD-10-CM

## 2021-04-17 DIAGNOSIS — Z131 Encounter for screening for diabetes mellitus: Secondary | ICD-10-CM

## 2021-04-17 DIAGNOSIS — C73 Malignant neoplasm of thyroid gland: Secondary | ICD-10-CM

## 2021-04-17 NOTE — Written Directive (Addendum)
MOLECULAR IMAGING AND THERAPEUTICS WRITTEN DIRECTIVE   PATIENT NAME: Marissa Lane  PT DOB:   11-Oct-1990                                              MRN: [id number]  ---------------------------------------------------------------------------------------------------------------------   I-131 THYROID CANCER THERAPY   RADIOPHARMACEUTICAL:  Iodine-131 Capsule    PRESCRIBED DOSE FOR ADMINISTRATION: 100 mCi   ROUTE OFADMINISTRATION:  PO   DIAGNOSIS: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papillary thyroid carcinoma,  follicular variant). Status post total thyroidectomy.   REFERRING PHYSICIAN:KERR   THYROGEN STIMULATION OR HORMONE WITHDRAW:THYROGEN   REMNANT ABLATION OR ADJUVANT THERAPY:Remnant ablation   DATE OF THYROIDECTOMY:03/23/21   SURGEON:GERKIN   TSH:   Lab Results  Component Value Date   TSH 2.120 09/08/2020   TSH 0.832 04/27/2019   TSH 0.532 01/03/2017     PRIOR I-131 THERAPY (Date and Dose):none   Pathology:  Cell type: [x]   Papillary  []   Follicular  []   [place]   Largest tumor focus:  9    cm  Extrathyroidal Extension?     Yes []   No []   Does not apply  Lymphovascular Invasion?  Yes  []   No  []   Does not apply  Margins positive ? Yes []   No []   Does not apply  Lymph nodes positive? Yes []   No  []    Not sampled   # positive nodes:  n/a # negative nodes:  n/a   TNM staging: pT:  1a       PN: not assigned        Mx:    ADDITIONAL PHYSICIAN COMMENTS/NOTES   AUTHORIZED USER SIGNATURE & TIME STAMP:  Angelita Ingles, MD   04/17/21    3:32 PM

## 2021-04-18 ENCOUNTER — Other Ambulatory Visit: Payer: Self-pay | Admitting: *Deleted

## 2021-04-18 DIAGNOSIS — Z131 Encounter for screening for diabetes mellitus: Secondary | ICD-10-CM

## 2021-04-19 ENCOUNTER — Encounter: Payer: Commercial Managed Care - PPO | Admitting: Women's Health

## 2021-04-19 ENCOUNTER — Ambulatory Visit (INDEPENDENT_AMBULATORY_CARE_PROVIDER_SITE_OTHER): Payer: Commercial Managed Care - PPO | Admitting: Family Medicine

## 2021-04-19 ENCOUNTER — Other Ambulatory Visit: Payer: Self-pay

## 2021-04-19 DIAGNOSIS — F411 Generalized anxiety disorder: Secondary | ICD-10-CM

## 2021-04-19 DIAGNOSIS — R739 Hyperglycemia, unspecified: Secondary | ICD-10-CM | POA: Diagnosis not present

## 2021-04-19 DIAGNOSIS — E785 Hyperlipidemia, unspecified: Secondary | ICD-10-CM

## 2021-04-19 LAB — LIPID PANEL
Chol/HDL Ratio: 8.1 ratio — ABNORMAL HIGH (ref 0.0–4.4)
Cholesterol, Total: 258 mg/dL — ABNORMAL HIGH (ref 100–199)
HDL: 32 mg/dL — ABNORMAL LOW (ref >39)
LDL Chol Calc (NIH): 171 mg/dL — ABNORMAL HIGH (ref 0–99)
Triglycerides: 285 mg/dL — ABNORMAL HIGH (ref 0–149)
VLDL Cholesterol Cal: 55 mg/dL — ABNORMAL HIGH (ref 5–40)

## 2021-04-19 LAB — GLUCOSE, RANDOM: Glucose: 111 mg/dL — ABNORMAL HIGH (ref 70–99)

## 2021-04-19 MED ORDER — FLUOXETINE HCL 10 MG PO CAPS: 10.0000 mg | ORAL_CAPSULE | Freq: Every day | ORAL | 3 refills | Status: DC

## 2021-04-19 MED ORDER — HYDROXYZINE HCL 10 MG PO TABS: 10.0000 mg | ORAL_TABLET | Freq: Three times a day (TID) | ORAL | 0 refills | Status: DC | PRN

## 2021-04-19 NOTE — Progress Notes (Signed)
   Subjective:    Patient ID: Marissa Lane, female    DOB: 1990-10-18, 30 y.o.   MRN: [id number]  HPI  Hyperlipidemia, unspecified hyperlipidemia type - Plan: Lipid panel  Hyperglycemia - Plan: Hemoglobin A1c  Patient recently had surgery to remove her thyroid gland and a lipoma there is no tumor present but she did have a thyroid tumor removed from within her abdomen.  She is following up with specialist there is possibility there could be additional tumors elsewhere she does not know  Have a lot of stress anxiety related into her symptoms and related into her uncertainty of her current illness she does admit to having some depression symptoms but also a fair amount of anxiousness  Review of Systems     Objective:   Physical Exam  Lungs clear heart regular pulse normal      Assessment & Plan:  1. Hyperlipidemia, unspecified hyperlipidemia type Watch diet stay active check lab work 2. Hyperglycemia History of hyperglycemia check A1c - Hemoglobin A1c GAD-start low-dose Prozac.  Follow-up if progressive troubles or worse  Give [place] feedback within 4 weeks how things are going certainly if getting worse notify us patient denies being suicidal homicidal  Follow-up by January

## 2021-04-20 NOTE — Progress Notes (Signed)
Pt here for physical for work, had physical in Jan, note given that she does not need another physical/pap.   This encounter was created in error - please disregard.

## 2021-04-23 ENCOUNTER — Encounter: Payer: Self-pay | Admitting: Family Medicine

## 2021-05-01 ENCOUNTER — Other Ambulatory Visit: Payer: Self-pay

## 2021-05-01 ENCOUNTER — Encounter (HOSPITAL_COMMUNITY)
Admission: RE | Admit: 2021-05-01 | Discharge: 2021-05-01 | Disposition: A | Discharge status: Home / Self Care | Payer: Commercial Managed Care - PPO | Source: Ambulatory Visit | Attending: Internal Medicine | Admitting: Internal Medicine

## 2021-05-01 DIAGNOSIS — C73 Malignant neoplasm of thyroid gland: Secondary | ICD-10-CM | POA: Insufficient documentation

## 2021-05-01 MED ORDER — THYROTROPIN ALFA 0.9 MG IM SOLR
0.9000 mg | INTRAMUSCULAR | Status: AC
  Administered 2021-05-01: 0.9 mg via INTRAMUSCULAR

## 2021-05-01 MED ORDER — STERILE WATER FOR INJECTION IJ SOLN
INTRAMUSCULAR | Status: AC
  Filled 2021-05-01: qty 10

## 2021-05-02 ENCOUNTER — Inpatient Hospital Stay (HOSPITAL_COMMUNITY)
Admission: RE | Admit: 2021-05-02 | Discharge: 2021-05-02 | Disposition: A | Discharge status: Home / Self Care | Payer: Self-pay | Source: Ambulatory Visit | Attending: Internal Medicine | Admitting: Internal Medicine

## 2021-05-02 ENCOUNTER — Encounter (HOSPITAL_COMMUNITY)
Admission: RE | Admit: 2021-05-02 | Discharge: 2021-05-02 | Disposition: A | Discharge status: Home / Self Care | Payer: Commercial Managed Care - PPO | Source: Ambulatory Visit | Attending: Internal Medicine | Admitting: Internal Medicine

## 2021-05-02 DIAGNOSIS — C73 Malignant neoplasm of thyroid gland: Secondary | ICD-10-CM | POA: Diagnosis not present

## 2021-05-02 MED ORDER — THYROTROPIN ALFA 0.9 MG IM SOLR
0.9000 mg | INTRAMUSCULAR | Status: AC
  Administered 2021-05-02: 0.9 mg via INTRAMUSCULAR

## 2021-05-02 MED ORDER — STERILE WATER FOR INJECTION IJ SOLN
INTRAMUSCULAR | Status: AC
  Filled 2021-05-02: qty 10

## 2021-05-03 ENCOUNTER — Ambulatory Visit (HOSPITAL_COMMUNITY): Payer: Self-pay

## 2021-05-03 ENCOUNTER — Encounter (HOSPITAL_COMMUNITY)
Admission: RE | Admit: 2021-05-03 | Discharge: 2021-05-03 | Disposition: A | Discharge status: Home / Self Care | Payer: Commercial Managed Care - PPO | Source: Ambulatory Visit | Attending: Internal Medicine | Admitting: Internal Medicine

## 2021-05-03 ENCOUNTER — Encounter (HOSPITAL_COMMUNITY): Payer: Commercial Managed Care - PPO

## 2021-05-03 ENCOUNTER — Other Ambulatory Visit: Payer: Self-pay

## 2021-05-03 DIAGNOSIS — C73 Malignant neoplasm of thyroid gland: Secondary | ICD-10-CM | POA: Insufficient documentation

## 2021-05-03 LAB — HCG, SERUM, QUALITATIVE: Preg, Serum: NEGATIVE

## 2021-05-03 MED ORDER — SODIUM IODIDE I 131 CAPSULE
100.5000 | Freq: Once | INTRAVENOUS | Status: AC | PRN
  Administered 2021-05-03: 100.5 via ORAL

## 2021-05-04 ENCOUNTER — Ambulatory Visit (HOSPITAL_COMMUNITY): Payer: Self-pay

## 2021-05-05 ENCOUNTER — Encounter: Payer: Self-pay | Admitting: Family Medicine

## 2021-05-14 ENCOUNTER — Encounter (HOSPITAL_COMMUNITY)
Admission: RE | Admit: 2021-05-14 | Discharge: 2021-05-14 | Disposition: A | Discharge status: Home / Self Care | Payer: Commercial Managed Care - PPO | Source: Ambulatory Visit | Attending: Internal Medicine | Admitting: Internal Medicine

## 2021-05-14 ENCOUNTER — Other Ambulatory Visit: Payer: Self-pay

## 2021-05-14 DIAGNOSIS — C73 Malignant neoplasm of thyroid gland: Secondary | ICD-10-CM | POA: Insufficient documentation

## 2021-06-12 ENCOUNTER — Encounter: Payer: Self-pay | Admitting: Family Medicine

## 2021-06-13 ENCOUNTER — Telehealth: Payer: Self-pay | Admitting: Family Medicine

## 2021-06-13 NOTE — Telephone Encounter (Signed)
All of the family members are sick with flulike illness over the past few days now she has body aches headache fever chills she was here in the office with her children.  I went ahead listen to her lungs today are clear she is not respiratory distress I believe she has the flu started last evening into today recommend Tamiflu twice daily and vomiting with medicine stop medicine warning signs regarding progressive fluid was discussed work excuse was given  Prescription for Tamiflu given for her husband if he starts having symptoms along with precautions regarding the medication.  They will get filled if they need  If any ongoing troubles or problems to follow-up

## 2021-07-27 ENCOUNTER — Telehealth: Payer: Self-pay | Admitting: Adult Health

## 2021-07-27 NOTE — Telephone Encounter (Signed)
Patient called to schedule a transvaginal u/s. I see orders in there, but they are from last April. She said she has to have a follow up for a malignant struma ovarii 6 months post rai. Please advise.

## 2021-07-27 NOTE — Telephone Encounter (Signed)
Returned pt's call, two identifiers used. Informed pt that her concerns would be sent to Dr Nelda Marseille, but that she was out of the office until 1/25, so it may not be addressed right away. Pt confirmed understanding.

## 2021-08-01 ENCOUNTER — Ambulatory Visit: Payer: Commercial Managed Care - PPO | Admitting: Family Medicine

## 2021-08-01 ENCOUNTER — Encounter: Payer: Self-pay | Admitting: Family Medicine

## 2021-08-01 ENCOUNTER — Other Ambulatory Visit: Payer: Self-pay

## 2021-08-01 VITALS — BP 127/83 | HR 97 | Temp 98.2°F | Wt 205.4 lb

## 2021-08-01 DIAGNOSIS — F411 Generalized anxiety disorder: Secondary | ICD-10-CM

## 2021-08-01 LAB — HEMOGLOBIN A1C
Est. average glucose Bld gHb Est-mCnc: 108 mg/dL
Hgb A1c MFr Bld: 5.4 % (ref 4.8–5.6)

## 2021-08-01 LAB — LIPID PANEL
Chol/HDL Ratio: 6.1 ratio — ABNORMAL HIGH (ref 0.0–4.4)
Cholesterol, Total: 221 mg/dL — ABNORMAL HIGH (ref 100–199)
HDL: 36 mg/dL — ABNORMAL LOW (ref >39)
LDL Chol Calc (NIH): 148 mg/dL — ABNORMAL HIGH (ref 0–99)
Triglycerides: 201 mg/dL — ABNORMAL HIGH (ref 0–149)
VLDL Cholesterol Cal: 37 mg/dL (ref 5–40)

## 2021-08-01 MED ORDER — FLUOXETINE HCL 20 MG PO CAPS: 20.0000 mg | ORAL_CAPSULE | Freq: Every day | ORAL | 1 refills | Status: DC

## 2021-08-01 NOTE — Patient Instructions (Signed)
[facility] in [place] update within 4 to 6 weeks on how 20 mg of prozac is doing

## 2021-08-01 NOTE — Progress Notes (Signed)
° °  Subjective:    Patient ID: Marissa Lane, female    DOB: [birth year], 31 y.o.   MRN: [id number]  HPI Pt here for follow up on labs and medications. Pt was placed on Prozac 10 mg back in September. Began taking it regular about 2 months ago. Patient finds himself feeling stressed and anxious to a degree denies any major setbacks.  Denies being depressed To some degree and she feels she does have underlying PTSD from previous issues she also relates longstanding anxiety that goes way back to her younger days   Pt placed on 200 mcg Levothyroxine; has labs for that next week.  History of thyroid cancer under the care of endocrinology they are trying to keep TSH at a very low level to suppress   Review of Systems     Objective:   Physical Exam Lungs clear heart regular blood pressure good on recheck       Assessment & Plan:  Hyperthyroidism being followed by specialist they are trying to keep her TSH in a suppressed level of minimize her risk of reoccurrence of the condition Certainly though there is increased side effects as well as osteopenia that can occur with this but this is being monitored by her frequent allergy  Significant underlying anxiety along with history of PTSD recommend counseling she will check with her insurance company and see which counselors could help her I would recommend cognitive behavioral therapy.  In addition to this patient should follow-up closely.  Recommend gradually increasing Prozac we will go to 20 mg currently in 4 to 6 weeks if doing well go up to 40 mg with the potential to go to 60 mg.  Patient to follow-up with him in 4 months she is to send [place] my chart updates  FMLA will be filled out for her anxiety and thyroid condition with potential intermittent absences related to flareups of the anxiety as well as needing to see [place] on a regular basis counselor on a regular basis and also endocrinologist multiple times a year

## 2021-08-09 ENCOUNTER — Encounter: Payer: Self-pay | Admitting: Family Medicine

## 2021-08-26 ENCOUNTER — Telehealth: Payer: Self-pay | Admitting: Emergency Medicine

## 2021-08-26 ENCOUNTER — Ambulatory Visit
Admission: RE | Admit: 2021-08-26 | Discharge: 2021-08-26 | Disposition: A | Discharge status: Home / Self Care | Payer: Commercial Managed Care - PPO | Source: Ambulatory Visit | Attending: Urgent Care | Admitting: Urgent Care

## 2021-08-26 ENCOUNTER — Other Ambulatory Visit: Payer: Self-pay

## 2021-08-26 VITALS — BP 148/79 | HR 78 | Temp 98.1°F | Resp 18

## 2021-08-26 DIAGNOSIS — S39012A Strain of muscle, fascia and tendon of lower back, initial encounter: Secondary | ICD-10-CM

## 2021-08-26 DIAGNOSIS — M545 Low back pain, unspecified: Secondary | ICD-10-CM

## 2021-08-26 MED ORDER — TIZANIDINE HCL 4 MG PO TABS: 4.0000 mg | ORAL_TABLET | Freq: Every day | ORAL | 0 refills | Status: DC

## 2021-08-26 MED ORDER — NAPROXEN 500 MG PO TABS: 500.0000 mg | ORAL_TABLET | Freq: Two times a day (BID) | ORAL | 0 refills | Status: DC

## 2021-08-26 NOTE — ED Triage Notes (Signed)
Lower back pain more towards left side that started Friday.  Felt pain when picking something up from floor.

## 2021-08-26 NOTE — ED Provider Notes (Signed)
[facility]   MRN: [id number] DOB: 10/10/1990  Subjective:   Marissa Lane is a 31 y.o. female presenting for 3 day history of acute onset persistent moderate to severe left lower back pain.  Symptoms started after patient bent down to pick something off the floor and then felt a sudden pain as she came back up.  She has since had decreased range of motion, difficulty moving around.  No midline tenderness, weakness, numbness or tingling, changes to bowel or urinary habits, falls, trauma.  Patient cannot tolerate narcotic medications.  Patient does not drink much water.  She drinks a lot of diet soda, multiple cups of coffee per day.  No current facility-administered medications for this encounter.  Current Outpatient Medications:    acetaminophen (TYLENOL) 325 MG tablet, Take 650 mg by mouth every 6 (six) hours as needed for moderate pain., Disp: , Rfl:    calcium carbonate (TUMS) 500 MG chewable tablet, Chew 2 tablets (400 mg of elemental calcium total) by mouth 2 (two) times daily., Disp: 90 tablet, Rfl: 1   FLUoxetine (PROZAC) 20 MG capsule, Take 1 capsule (20 mg total) by mouth daily., Disp: 90 capsule, Rfl: 1   hydrOXYzine (ATARAX/VISTARIL) 10 MG tablet, Take 1 tablet (10 mg total) by mouth every 8 (eight) hours as needed., Disp: 21 tablet, Rfl: 0   levonorgestrel (MIRENA) 20 MCG/24HR IUD, 1 each by Intrauterine route once., Disp: , Rfl:    levothyroxine (SYNTHROID) 200 MCG tablet, Take 200 mcg by mouth every morning., Disp: , Rfl:    Allergies  Allergen Reactions   Bupropion Other (See Comments)    WORSENING DEPRESSION   Cymbalta [Duloxetine Hcl] Other (See Comments)    Worsening depression   Oxycodone Itching   Silicone Itching    Contacts-eyes itching   Vicodin [Hydrocodone-Acetaminophen] Other (See Comments)    Bad migraine   Zofran [Ondansetron Hcl] Itching    Past Medical History:  Diagnosis Date   Acne    Anxiety    Cancer ([place])    thyroid   Chest  pain    Chronic hypertension    Diabetes mellitus without complication ([place])    Dyspnea    Dysthymic disorder    over dose age 65   GERD (gastroesophageal reflux disease)    Gestational diabetes    metformin   H/O urinary frequency 06/25/2013   Irregular intermenstrual bleeding 10/12/2013   Lipoma    right scapula   Obesity    Palpitations    PCOS (polycystic ovarian syndrome)    Postpartum depression    Tobacco abuse      Past Surgical History:  Procedure Laterality Date   CHOLECYSTECTOMY  03/06/2012   Procedure: LAPAROSCOPIC CHOLECYSTECTOMY;  Surgeon: Donato Heinz, MD;  Location: [facility];  Service: General;  Laterality: N/A;   ESOPHAGOGASTRODUODENOSCOPY N/A 09/09/2012   Procedure: ESOPHAGOGASTRODUODENOSCOPY (EGD);  Surgeon: Rogene Houston, MD;  Location: [facility];  Service: Endoscopy;  Laterality: N/A;  340   MASS EXCISION Right 03/23/2021   Procedure: EXCISION SOFT TISSUE MASS ON RIGHT SHOULDER;  Surgeon: Armandina Gemma, MD;  Location: [facility];  Service: General;  Laterality: Right;   MOUTH SURGERY     ROBOTIC ASSISTED LAPAROSCOPIC OVARIAN CYSTECTOMY Left 01/02/2021   Procedure: XI ROBOTIC ASSISTED LEFT LAPAROSCOPIC OVARIAN CYSTECTOMY;  Surgeon: Everitt Amber, MD;  Location[facility];  Service: Gynecology;  Laterality: Left;   ROBOTIC ASSISTED SALPINGO OOPHERECTOMY Right 01/02/2021   Procedure: XI ROBOTIC ASSISTED RIGHT SALPINGO OOPHORECTOMY;  Surgeon:  Everitt Amber, MD;  [facility];  Service: Gynecology;  Laterality: Right;   SKIN GRAFT  2009   gum graft   THYROIDECTOMY N/A 03/23/2021   Procedure: TOTAL THYROIDECTOMY;  Surgeon: Armandina Gemma, MD;  [facility];  Service: General;  Laterality: N/A;   TONSILLECTOMY      Family History  Adopted: Yes  Problem Relation Age of Onset   Congestive Heart Failure Mother    Irritable bowel syndrome Mother    Ulcers Mother    Anemia Mother    Drug abuse Mother    Hypertension Brother    Colon cancer Maternal Grandmother 42    Anxiety disorder Maternal Grandmother    Heart disease Maternal Grandfather 34       MI    Social History   Tobacco Use   Smoking status: Every Day    Packs/day: 1.00    Years: 10.00    Pack years: 10.00    Types: Cigarettes   Smokeless tobacco: Never  Vaping Use   Vaping Use: Never used  Substance Use Topics   Alcohol use: No   Drug use: No    ROS   Objective:   Vitals: BP (!) 148/79 (BP Location: Right Arm)    Pulse 78    Temp 98.1 F (36.7 C) (Oral)    Resp 18    SpO2 97%   Physical Exam Constitutional:      General: She is not in acute distress.    Appearance: Normal appearance. She is well-developed. She is not ill-appearing, toxic-appearing or diaphoretic.  HENT:     Head: Normocephalic and atraumatic.     Nose: Nose normal.     Mouth/Throat:     Mouth: Mucous membranes are moist.  Eyes:     General: No scleral icterus.       Right eye: No discharge.        Left eye: No discharge.     Extraocular Movements: Extraocular movements intact.  Cardiovascular:     Rate and Rhythm: Normal rate.  Pulmonary:     Effort: Pulmonary effort is normal.  Musculoskeletal:     Comments: Full range of motion throughout.  Strength 5/5 for lower extremities.  Patient ambulates without any assistance at expected pace.  No ecchymosis, swelling, lacerations or abrasions.  Patient does have paraspinal muscle tenderness along the left lumbar region of her back excluding the midline.  No radicular symptoms.  Negative straight leg raise bilaterally.    Skin:    General: Skin is warm and dry.  Neurological:     General: No focal deficit present.     Mental Status: She is alert and oriented to person, place, and time.     Motor: No weakness.     Coordination: Coordination normal.     Gait: Gait normal.     Deep Tendon Reflexes: Reflexes normal.  Psychiatric:        Mood and Affect: Mood normal.        Behavior: Behavior normal.        Thought Content: Thought content normal.     Assessment and Plan :   PDMP not reviewed this encounter.  1. Acute left-sided low back pain without sciatica   2. Lumbar strain, initial encounter     Will manage conservatively for back strain with NSAID and muscle relaxant, rest and modification of physical activity.  Anticipatory guidance provided.  Recommended hydrating much better.  Back care reviewed.  Counseled patient on potential  for adverse effects with medications prescribed/recommended today, ER and return-to-clinic precautions discussed, patient verbalized understanding.    Jaynee Eagles, PA-C 08/26/21 1323

## 2021-08-26 NOTE — Telephone Encounter (Signed)
Spoke with pharmacy they had not received prescriptions electronically, verbally gave prescriptions to pharmacists.

## 2021-08-29 ENCOUNTER — Other Ambulatory Visit: Payer: Self-pay

## 2021-08-29 ENCOUNTER — Encounter: Payer: Self-pay | Admitting: Nurse Practitioner

## 2021-08-29 ENCOUNTER — Ambulatory Visit (INDEPENDENT_AMBULATORY_CARE_PROVIDER_SITE_OTHER): Payer: Commercial Managed Care - PPO | Admitting: Nurse Practitioner

## 2021-08-29 VITALS — BP 120/82 | HR 80 | Temp 98.2°F | Wt 208.4 lb

## 2021-08-29 DIAGNOSIS — M545 Low back pain, unspecified: Secondary | ICD-10-CM | POA: Diagnosis not present

## 2021-08-29 NOTE — Progress Notes (Signed)
° °  Subjective:    Patient ID: Marissa Lane, female    DOB: 05/31/1991, 31 y.o.   MRN: [id number]  HPI  Patient here for follow-up of urgent care visit on 08/26/2020 for left sided lower back pain.  Patient was prescribed Zanaflex and naproxen at that visit.  Patient has been taking naproxen as needed for pain.  Patient states that she has not been taking Zanaflex as frequently because she has small kids and concerned about being drowsy.  Patient states that her pain has gotten better since tweaking her back about 5 days ago while picking something off the floor.  Patient states that her back feels stiff and that she has some upper back pain because she feels like she has been compensating for her left lower back pain.  Patient denies any shooting pains down her leg, leg weakness, decreased range of motion, changes to bowel or voiding habits.  Patient states that pain is worse at night when she is trying to get comfortable.  Patient using Biofreeze as needed for pain with relief.  Review of Systems  Musculoskeletal:  Positive for back pain.      Objective:   Physical Exam Constitutional:      General: She is not in acute distress.    Appearance: Normal appearance. She is normal weight. She is not ill-appearing or toxic-appearing.  Cardiovascular:     Rate and Rhythm: Normal rate and regular rhythm.     Pulses: Normal pulses.     Heart sounds: Normal heart sounds. No murmur heard. Pulmonary:     Effort: Pulmonary effort is normal. No respiratory distress.     Breath sounds: Normal breath sounds. No wheezing.  Musculoskeletal:        General: Tenderness present. No swelling, deformity or signs of injury. Normal range of motion.     Cervical back: Normal range of motion.     Comments: Point tenderness noted to the left lower back.  No bruising, erythema, swelling noted to area.  Range of motion intact.  Strength 5 out of 5 to lower extremities  Skin:    General: Skin is warm.     Capillary  Refill: Capillary refill takes less than 2 seconds.  Neurological:     General: No focal deficit present.     Mental Status: She is alert and oriented to person, place, and time.  Psychiatric:        Mood and Affect: Mood normal.        Behavior: Behavior normal.          Assessment & Plan:   1. Lumbar pain - Lumbar strain likely -Continue taking naproxen and Zanaflex as prescribed. -I believe this patient will benefit from physical therapy and overall strengthening. - Ambulatory referral to Physical Therapy -Engage in weightbearing exercises as tolerated such as walking. -Return to clinic if pain not better or worse.

## 2021-09-10 ENCOUNTER — Ambulatory Visit (INDEPENDENT_AMBULATORY_CARE_PROVIDER_SITE_OTHER): Payer: Commercial Managed Care - PPO

## 2021-09-10 ENCOUNTER — Encounter: Payer: Self-pay | Admitting: Obstetrics & Gynecology

## 2021-09-10 ENCOUNTER — Other Ambulatory Visit: Payer: Self-pay

## 2021-09-10 ENCOUNTER — Ambulatory Visit: Payer: Commercial Managed Care - PPO | Admitting: Obstetrics & Gynecology

## 2021-09-10 VITALS — BP 145/98 | HR 98 | Ht 64.0 in | Wt 207.8 lb

## 2021-09-10 DIAGNOSIS — C561 Malignant neoplasm of right ovary: Secondary | ICD-10-CM | POA: Diagnosis not present

## 2021-09-10 DIAGNOSIS — R102 Pelvic and perineal pain: Secondary | ICD-10-CM

## 2021-09-10 DIAGNOSIS — N83202 Unspecified ovarian cyst, left side: Secondary | ICD-10-CM | POA: Diagnosis not present

## 2021-09-10 NOTE — Progress Notes (Signed)
° °  GYN VISIT Patient name: Marissa Lane MRN [id number]  Date of birth: 05/26/1991 Chief Complaint:   Follow-up ([place] today)  History of Present Illness:   Marissa Lane is a 31 y.o. 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  Records reviewed- last seen by Dr. Denman George 01/2021.  Monitoring to be completed from endocrine standpoint, currently being followed by Dr. Buddy Duty  AUB: Some improvement s/p surgery.  Typically spotting for 1-2wks, sometimes less.  Then stop for a few weeks or other times will go up to 6wks without a period.  Currently bleeding pattern is tolerable, just a bit unpredictable.  Pelvic pain: On occasion will have left-sided pain, improves with rest.  Does not require medication.  Not as significant as before.  No nausea/vomiting.  No other acute complaints.  Pelvic US today-09/10/21: homogeneous anteverted uterus,WNL,IUD is centrally located within the endometrium,EEC 9.8 mm,right oophorectomy,right adnexa WNL,simple left ovarian cyst 3.8 x 3.3 x 2.8 cm,1.4 x .7 x .8 cm non shadowing echogenic mass within the left ovary,no color flow visualized,no free fluid,no pain during ultrasound  Last pap 04/2019 Depression screen [facility] 2/9 04/19/2021 08/21/2020 08/24/2019 04/27/2019 04/09/2018  Decreased Interest 0 0 3 0 0  Down, Depressed, Hopeless 0 0 3 1 0  PHQ - 2 Score 0 0 6 1 0  Altered sleeping 1 1 3  - 1  Tired, decreased energy 1 1 3  - 1  Change in appetite 0 1 3 - 0  Feeling bad or failure about yourself  0 1 3 - 0  Trouble concentrating 1 1 3  - 0  Moving slowly or fidgety/restless 0 0 1 - 0  Suicidal thoughts 0 0 0 - 0  PHQ-9 Score 3 5 22  - 2  Difficult doing work/chores Somewhat difficult - Very difficult - Not difficult at all  Some recent data might be hidden     Review of Systems:    Pertinent items are noted in HPI Denies fever/chills, dizziness, headaches, visual disturbances, fatigue, shortness of breath, chest pain, abdominal pain, vomiting, bowel movements, urination, or intercourse unless otherwise stated above.  Pertinent History Reviewed:  Reviewed past medical,surgical, social, obstetrical and family history.  Reviewed problem list, medications and allergies. Physical Assessment:   Vitals:   09/10/21 0947  BP: (!) 145/98  Pulse: 98  Weight: 207 lb 12.8 oz (94.3 kg)  Height: 5\' 4"  (1.626 m)  Body mass index is 35.67 kg/m.       Physical Examination:   General appearance: alert, well appearing, and in no distress  Psych: mood appropriate, normal affect  Skin: warm & dry   Cardiovascular: normal heart rate noted  Respiratory: normal respiratory effort, no distress  Abdomen: soft, non-tender   Pelvic: examination not indicated  Extremities: no edema   Chaperone: N/A    Assessment & Plan:  1) Struma Ovarri -s/p oophorectomy -no further monitoring/follow up from a gyn standpoint warranted  2) IUD in place- confirmed by [place]  3) Pelvic pain -reviewed US findings, small simple cyst in left ovary -reviewed conservative management and will plan to continue to monitor symptoms -consider repeat US as clinically indicated   Return for Oct 2023- annual with Dr. Nelda Marseille.   Janyth Pupa, DO Attending [place], [facility] for [employer], [place]

## 2021-09-10 NOTE — Progress Notes (Signed)
PELVIC US TA/TV: homogeneous anteverted uterus,WNL,IUD is centrally located within the endometrium,EEC 9.8 mm,right oophorectomy,right adnexa WNL,simple left ovarian cyst 3.8 x 3.3 x 2.8 cm,1.4 x .7 x .8 cm non shadowing echogenic mass within the left ovary,no color flow visualized,no free fluid,no pain during ultrasound  Chaperone Peggy

## 2021-09-12 ENCOUNTER — Encounter: Payer: Self-pay | Admitting: Family Medicine

## 2021-09-18 ENCOUNTER — Ambulatory Visit (HOSPITAL_COMMUNITY): Payer: Commercial Managed Care - PPO | Admitting: Physical Therapy

## 2021-10-12 ENCOUNTER — Other Ambulatory Visit: Payer: Self-pay

## 2021-10-12 ENCOUNTER — Encounter (HOSPITAL_COMMUNITY): Payer: Self-pay | Admitting: Physical Therapy

## 2021-10-12 ENCOUNTER — Ambulatory Visit (HOSPITAL_COMMUNITY): Payer: Commercial Managed Care - PPO | Attending: Nurse Practitioner | Admitting: Physical Therapy

## 2021-10-12 DIAGNOSIS — M542 Cervicalgia: Secondary | ICD-10-CM | POA: Insufficient documentation

## 2021-10-12 DIAGNOSIS — M545 Low back pain, unspecified: Secondary | ICD-10-CM | POA: Insufficient documentation

## 2021-10-12 NOTE — Therapy (Signed)
?OUTPATIENT PHYSICAL THERAPY THORACOLUMBAR EVALUATION ? ? ?Patient Name: Marissa Lane ?MRN: [id number] ?DOB:1990/10/26, 31 y.o., female ?Today's Date: 10/12/2021 ? ? PT End of Session - 10/12/21 0943   ? ? Visit Number 1   ? Number of Visits 4   ? Date for PT Re-Evaluation 11/02/21   ? Authorization Type UMR/ UHC PPO ([id number])   ? PT Start Time [phone]   ? PT Stop Time 9798   ? PT Time Calculation (min) 50 min   ? Activity Tolerance Patient tolerated treatment well   ? Behavior During Therapy [facility] for tasks assessed/performed   ? ?  ?  ? ?  ? ? ?Past Medical History:  ?Diagnosis Date  ? Acne   ? Anxiety   ? Cancer [facility])   ? thyroid  ? Chest pain   ? Chronic hypertension   ? Diabetes mellitus without complication ([place])   ? Dyspnea   ? Dysthymic disorder   ? over dose age 88  ? GERD (gastroesophageal reflux disease)   ? Gestational diabetes   ? metformin  ? H/O urinary frequency 06/25/2013  ? Irregular intermenstrual bleeding 10/12/2013  ? Lipoma   ? right scapula  ? Obesity   ? Palpitations   ? PCOS (polycystic ovarian syndrome)   ? Postpartum depression   ? Tobacco abuse   ? ?Past Surgical History:  ?Procedure Laterality Date  ? CHOLECYSTECTOMY  03/06/2012  ? Procedure: LAPAROSCOPIC CHOLECYSTECTOMY;  Surgeon: Donato Heinz, MD;  Location: [facility];  Service: General;  Laterality: N/A;  ? ESOPHAGOGASTRODUODENOSCOPY N/A 09/09/2012  ? Procedure: ESOPHAGOGASTRODUODENOSCOPY (EGD);  Surgeon: Rogene Houston, MD;  [facility];  Service: Endoscopy;  Laterality: N/A;  340  ? MASS EXCISION Right 03/23/2021  ? Procedure: EXCISION SOFT TISSUE MASS ON RIGHT SHOULDER;  Surgeon: Armandina Gemma, MD;  [facility];  Service: General;  Laterality: Right;  ? MOUTH SURGERY    ? ROBOTIC ASSISTED LAPAROSCOPIC OVARIAN CYSTECTOMY Left 01/02/2021  ? Procedure: XI ROBOTIC ASSISTED LEFT LAPAROSCOPIC OVARIAN CYSTECTOMY;  Surgeon: Everitt Amber, MD;  [facility];  Service: Gynecology;  Laterality: Left;  ? ROBOTIC ASSISTED SALPINGO  OOPHERECTOMY Right 01/02/2021  ? Procedure: XI ROBOTIC ASSISTED RIGHT SALPINGO OOPHORECTOMY;  Surgeon: Everitt Amber, MD;  [facility];  Service: Gynecology;  Laterality: Right;  ? SKIN GRAFT  2009  ? gum graft  ? THYROIDECTOMY N/A 03/23/2021  ? Procedure: TOTAL THYROIDECTOMY;  Surgeon: Armandina Gemma, MD;  [facility];  Service: General;  Laterality: N/A;  ? TONSILLECTOMY    ? ?Patient Active Problem List  ? Diagnosis Date Noted  ? 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 Mass of soft tissue of shoulder 03/20/2021  ? Papillary thyroid carcinoma ([place]) 03/20/2021  ? Lipoma of back 01/18/2021  ? 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 Cyst of right ovary 01/02/2021  ? Cyst of ovary, left 11/02/2020  ? Encounter for IUD insertion 11/02/2020  ? Pregnancy examination or test, negative result 11/02/2020  ? GAD (generalized anxiety disorder) 08/24/2019  ? 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 Cervical high risk HPV (human papillomavirus) test positive 01/09/2017  ? History of gestational diabetes 07/19/2016  ? Chronic hypertension 04/30/2016  ? Smoker 03/05/2016  ? Migraine without aura and with status migrainosus, not intractable 04/06/2015  ? Anxiety 09/21/2013  ? PCOS (polycystic ovarian syndrome) 12/11/2012  ? Erosive esophagitis 11/05/2012  ? Morbid obesity ([place]) 09/14/2010  ? ? ?PCP: Wolfgang Phoenix,  Elayne Snare, MD ? ?REFERRING PROVIDER: [facility], Trenton Gammon, NP ? ?REFERRING DIAG: M54.50 (ICD-10-CM) - Lumbar pain  ? ?THERAPY DIAG:  ?Low back pain, unspecified back pain laterality, unspecified chronicity, unspecified whether sciatica present ? ?Cervicalgia ? ?ONSET DATE: February 2023 ? ?SUBJECTIVE:                                                                                                                                                                                          ? ?SUBJECTIVE STATEMENT: ?Patient presents to therapy for lumbar strain, but also reports  recent ongoing issues with cervical pain and radiculopathy. She says she strained her back about a month ago but this has gotten much better recently. She notes within the last 2 weeks she has had onset of cervical radiculopathy in her LT arm and hand. She has taken muscle relaxer and tapered steroid. She has also been doing some exercises online which has been helpful.  ? ?PERTINENT HISTORY:  ?Hx of thyroid cancer  ? ?PAIN:  ?PAIN:  ?Are you having pain? No ? ? ? ?PRECAUTIONS: None ? ?WEIGHT BEARING RESTRICTIONS No ? ?FALLS:  ?Has patient fallen in last 6 months? No, Number of falls:  ? ?LIVING ENVIRONMENT: ?Lives with: lives with their family ?Lives in: House/apartment ? ?OCCUPATION: LPN ? ?PLOF: Independent ? ?PATIENT GOALS Learn exercises for prevention, and what to do  ? ? ?OBJECTIVE:  ? ?DIAGNOSTIC FINDINGS:  ?None ? ?PATIENT SURVEYS:  ?FOTO 94% function  ? ? ? ?COGNITION: ? Overall cognitive status: Within functional limits for tasks assessed   ? ?POSTURE:  ?Increased thoracic kyphosis, FHRS  ? ?PALPATION: ?Min TTP about bilateral upper trap ? ?LUMBAR ROM:  ? ?Active  A/PROM  ?10/12/2021  ?Flexion 15% limited  ?Extension 25% limited  ?Right lateral flexion WFL  ?Left lateral flexion WFL  ?Right rotation   ?Left rotation   ? (Blank rows = not tested) ?Cervical AROM: ?Flexion 40  ?Extension 45  ?Right lateral flexion   ?Left lateral flexion   ?Right rotation 63  ?Left rotation 64  ? ?LE MMT: ? ?MMT Right ?10/12/2021 Left ?10/12/2021  ?Hip flexion 5 5  ?Hip extension 4+ 4+  ?Hip abduction 4+ 4+  ?Hip adduction    ?Hip internal rotation    ?Hip external rotation    ?Knee flexion    ?Knee extension 5 5  ?Ankle dorsiflexion 5 5  ?Ankle plantarflexion    ?Ankle inversion    ?Ankle eversion    ? (Blank rows = not tested) ? ?UE MMT: 5/5 grossly ? ? ?TODAY'S TREATMENT  ?10/12/21 ?Ab brace x5 ?Ab march x5 ?Bridge x5 ?Chin  tuck x5 ?Scap retraction x5 ? ? ?PATIENT EDUCATION:  ?Education details: on evaluation findings, POC  and HEP  ?Person educated: Patient ?Education method: Explanation and Demonstration ?Education comprehension: verbalized understanding and returned demonstration ? ? ?HOME EXERCISE PROGRAM: ?Access Code: [email or web address] ?URL: [email or web address] ?Date: 10/12/2021 ?Prepared by: Josue Hector ? ?Exercises ?- Supine Transversus Abdominis Bracing - Hands on Stomach  - 2-3 x daily - 7 x weekly - 1-2 sets - 10 reps - 5 second hold ?- Supine March  - 2-3 x daily - 7 x weekly - 1-2 sets - 10 reps ?- Supine Bridge  - 2-3 x daily - 7 x weekly - 1-2 sets - 10 reps - 5 second hold ?- Seated Passive Cervical Retraction  - 2-3 x daily - 7 x weekly - 1-2 sets - 10 reps ?- Seated Scapular Retraction  - 2-3 x daily - 7 x weekly - 1-2 sets - 10 reps - 5 second hold ? ?ASSESSMENT: ? ?CLINICAL IMPRESSION: ?Patient is a 31 y.o. female who presents to physical therapy with complaint of lumbar and neck pain. Patient demonstrates very mild muscle weakness, slight reduction ROM, postural deficits and fascial restrictions which are likely contributing to symptoms of pain and are negatively impacting patient ability to perform ADLs and functional mobility tasks. Patient will benefit from skilled physical therapy services to address these deficits to reduce pain and improve level of function with ADLs and functional mobility tasks. ? ? ? ?OBJECTIVE IMPAIRMENTS Abnormal gait, decreased activity tolerance, decreased ROM, decreased strength, increased fascial restrictions, improper body mechanics, postural dysfunction, and pain.  ? ?ACTIVITY LIMITATIONS cleaning, community activity, meal prep, occupation, laundry, yard work, shopping, and yard work.  ? ?PERSONAL FACTORS  None  are also affecting patient's functional outcome.  ? ? ?REHAB POTENTIAL: Excellent ? ?CLINICAL DECISION MAKING: Stable/uncomplicated ? ?EVALUATION COMPLEXITY: Moderate ? ? ?GOALS: ?SHORT TERM GOALS: Target date: 10/26/2021 ? ?Patient will be independent with initial  HEP and self-management strategies to improve functional outcomes ?Baseline:  ?Goal status: INITIAL  ? ? ?LONG TERM GOALS: Target date: 11/09/2021 ? ?Patient will be independent with advanced HEP and self-mana

## 2021-10-18 ENCOUNTER — Ambulatory Visit (HOSPITAL_COMMUNITY): Payer: Commercial Managed Care - PPO

## 2021-10-18 ENCOUNTER — Encounter (HOSPITAL_COMMUNITY): Payer: Self-pay

## 2021-10-18 DIAGNOSIS — M545 Low back pain, unspecified: Secondary | ICD-10-CM | POA: Diagnosis not present

## 2021-10-18 DIAGNOSIS — M542 Cervicalgia: Secondary | ICD-10-CM

## 2021-10-18 NOTE — Therapy (Signed)
OUTPATIENT PHYSICAL THERAPY THORACOLUMBAR EVALUATION   Patient Name: Marissa Lane MRN: [id number] DOB:04-06-1991, 31 y.o., female Today's Date: 10/18/2021   PT End of Session - 10/18/21 1803     Visit Number 2    Number of Visits 4    Date for PT Re-Evaluation 11/02/21    Authorization Type UMR/ UHC PPO (30VL)    PT Start Time 1740    PT Stop Time 1820    PT Time Calculation (min) 40 min    Activity Tolerance Patient tolerated treatment well;No increased pain    Behavior During Therapy [facility] for tasks assessed/performed              Past Medical History:  Diagnosis Date   Acne    Anxiety    Cancer ([place])    thyroid   Chest pain    Chronic hypertension    Diabetes mellitus without complication (HCC)    Dyspnea    Dysthymic disorder    over dose age 10   GERD (gastroesophageal reflux disease)    Gestational diabetes    metformin   H/O urinary frequency 06/25/2013   Irregular intermenstrual bleeding 10/12/2013   Lipoma    right scapula   Obesity    Palpitations    PCOS (polycystic ovarian syndrome)    Postpartum depression    Tobacco abuse    Past Surgical History:  Procedure Laterality Date   CHOLECYSTECTOMY  03/06/2012   Procedure: LAPAROSCOPIC CHOLECYSTECTOMY;  Surgeon: Donato Heinz, MD;  Location: [facility];  Service: General;  Laterality: N/A;   ESOPHAGOGASTRODUODENOSCOPY N/A 09/09/2012   Procedure: ESOPHAGOGASTRODUODENOSCOPY (EGD);  Surgeon: Rogene Houston, MD;  Location: [facility];  Service: Endoscopy;  Laterality: N/A;  340   MASS EXCISION Right 03/23/2021   Procedure: EXCISION SOFT TISSUE MASS ON RIGHT SHOULDER;  Surgeon: Armandina Gemma, MD;  Location: [facility];  Service: General;  Laterality: Right;   MOUTH SURGERY     ROBOTIC ASSISTED LAPAROSCOPIC OVARIAN CYSTECTOMY Left 01/02/2021   Procedure: XI ROBOTIC ASSISTED LEFT LAPAROSCOPIC OVARIAN CYSTECTOMY;  Surgeon: Everitt Amber, MD;  Location: [facility];  Service: Gynecology;  Laterality: Left;   ROBOTIC  ASSISTED SALPINGO OOPHERECTOMY Right 01/02/2021   Procedure: XI ROBOTIC ASSISTED RIGHT SALPINGO OOPHORECTOMY;  Surgeon: Everitt Amber, MD;  [facility];  Service: Gynecology;  Laterality: Right;   SKIN GRAFT  2009   gum graft   THYROIDECTOMY N/A 03/23/2021   Procedure: TOTAL THYROIDECTOMY;  Surgeon: Armandina Gemma, MD;  [facility]: [facility];  Service: General;  Laterality: N/A;   TONSILLECTOMY     Patient Active Problem List   Diagnosis Date Noted   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[place]) 03/23/2021   Mass of soft tissue of shoulder 03/20/2021   Papillary thyroid carcinoma ([place]) 03/20/2021   Lipoma of back 01/18/2021   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[place]) 01/08/2021   Cyst of right ovary 01/02/2021   Cyst of ovary, left 11/02/2020   Encounter for IUD insertion 11/02/2020   Pregnancy examination or test, negative result 11/02/2020   GAD (generalized anxiety disorder) 08/24/2019   Attention deficit hyperactivity disorder (ADHD), 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   Cervical high risk HPV (human papillomavirus) test positive 01/09/2017   History of gestational diabetes 07/19/2016   Chronic hypertension 04/30/2016   Smoker 03/05/2016   Migraine without aura and with status migrainosus, not intractable 04/06/2015   Anxiety 09/21/2013   PCOS (polycystic ovarian syndrome) 12/11/2012   Erosive esophagitis 11/05/2012   Morbid obesity ([place]) 09/14/2010  PCP: Kathyrn Drown, MD  REFERRING PROVIDER: Claire Shown, NP  REFERRING DIAG: M54.50 (ICD-10-CM) - Lumbar pain   THERAPY DIAG:  Low back pain, unspecified back pain laterality, unspecified chronicity, unspecified whether sciatica present  Cervicalgia  ONSET DATE: February 2023  SUBJECTIVE:                                                                                                                                                                                           SUBJECTIVE STATEMENT: Pt reports she is feeling good this  afternoon.  Reports most discomfort in the morning.    PERTINENT HISTORY:  Hx of thyroid cancer   PAIN:  PAIN:  Are you having pain? No    PRECAUTIONS: None  WEIGHT BEARING RESTRICTIONS No  FALLS:  Has patient fallen in last 6 months? No, Number of falls:   LIVING ENVIRONMENT: Lives with: lives with their family Lives in: House/apartment  OCCUPATION: LPN  PLOF: Independent  PATIENT GOALS Learn exercises for prevention, and what to do    OBJECTIVE:   DIAGNOSTIC FINDINGS:  None  PATIENT SURVEYS:  FOTO 94% function     COGNITION:  Overall cognitive status: Within functional limits for tasks assessed    POSTURE:  Increased thoracic kyphosis, FHRS   PALPATION: Min TTP about bilateral upper trap  LUMBAR ROM:   Active  A/PROM  10/18/2021  Flexion 15% limited  Extension 25% limited  Right lateral flexion WFL  Left lateral flexion WFL  Right rotation   Left rotation    (Blank rows = not tested) Cervical AROM: Flexion 40  Extension 45  Right lateral flexion   Left lateral flexion   Right rotation 63  Left rotation 64   LE MMT:  MMT Right 10/18/2021 Left 10/18/2021  Hip flexion 5 5  Hip extension 4+ 4+  Hip abduction 4+ 4+  Hip adduction    Hip internal rotation    Hip external rotation    Knee flexion    Knee extension 5 5  Ankle dorsiflexion 5 5  Ankle plantarflexion    Ankle inversion    Ankle eversion     (Blank rows = not tested)  UE MMT: 5/5 grossly   TODAY'S TREATMENT  10/17/21:  Supine: Diaphragmatic breathing x 2 min Diaphragmatic breathing paired with ab sets Marching with ab set 10x5" Bridge 10x   Seated: posterior pelvic tilt with ab set 10x 5"   Cervical retraction 10x 5"   Wback10x  Standing: lumbar extension    Ab set against wall 10x5"   RTB: scapular retraction, rows, shoulder extension.    10/12/21 Ab brace  x5 Ab march x5 Bridge x5 Chin tuck x5 Scap retraction x5   PATIENT EDUCATION:  Education  details: Reviewed goals, educated importance of HEP compliance for maximal benefits Person educated: Patient Education method: Customer service manager Education comprehension: verbalized understanding and returned demonstration   HOME EXERCISE PROGRAM: Access Code: [email or web address] URL: [email or web address] Date: 10/12/2021 Prepared by: Josue Hector  Exercises - Supine Transversus Abdominis Bracing - Hands on Stomach  - 2-3 x daily - 7 x weekly - 1-2 sets - 10 reps - 5 second hold - Supine March  - 2-3 x daily - 7 x weekly - 1-2 sets - 10 reps - Supine Bridge  - 2-3 x daily - 7 x weekly - 1-2 sets - 10 reps - 5 second hold - Seated Passive Cervical Retraction  - 2-3 x daily - 7 x weekly - 1-2 sets - 10 reps - Seated Scapular Retraction  - 2-3 x daily - 7 x weekly - 1-2 sets - 10 reps - 5 second hold  3/30:  GTB scapular retraction, row and extension   ASSESSMENT:  CLINICAL IMPRESSION: Reviewed goals, educated importance of HEP compliance for maximal benefits, reviewed HEP with min cueing to improve abdominal bracing paired with diaphragmatic breathing to improve sequencing.  Session focus on abdominal strengthening and postural strengthening.  Pt shown abdominal sets in seated/standing position as well.  Progressed to theraband postural strengthening with good form and mechanics, added to HEP.      OBJECTIVE IMPAIRMENTS Abnormal gait, decreased activity tolerance, decreased ROM, decreased strength, increased fascial restrictions, improper body mechanics, postural dysfunction, and pain.   ACTIVITY LIMITATIONS cleaning, community activity, meal prep, occupation, laundry, yard work, shopping, and yard work.   PERSONAL FACTORS  None  are also affecting patient's functional outcome.    REHAB POTENTIAL: Excellent  CLINICAL DECISION MAKING: Stable/uncomplicated  EVALUATION COMPLEXITY: Moderate   GOALS: SHORT TERM GOALS: Target date: 11/01/2021  Patient will be  independent with initial HEP and self-management strategies to improve functional outcomes Baseline:  Goal status: Ongoing    LONG TERM GOALS: Target date: 11/15/2021  Patient will be independent with advanced HEP and self-management strategies to improve functional outcomes Baseline:  Goal status: Ongoing  2.  Patient will report consistent (3 consecutive days) reduction of neck and back pain to 0/10 at rest for improved quality of life and ability to perform ADLs  Baseline:  Goal status: Ongoing  3. Patient will report at least 75% overall improvement in subjective complaint to indicate improvement in ability to perform ADLs. Baseline:  Goal status: Ongoing    PLAN: PT FREQUENCY: 1x/week  PT DURATION: 3 weeks  PLANNED INTERVENTIONS: Therapeutic exercises, Therapeutic activity, Neuromuscular re-education, Balance training, Gait training, Patient/Family education, Joint manipulation, Joint mobilization, Stair training, Aquatic Therapy, Dry Needling, Electrical stimulation, Spinal manipulation, Spinal mobilization, Cryotherapy, Moist heat, scar mobilization, Taping, Traction, Ultrasound, Biofeedback, Ionotophoresis '4mg'$ /ml Dexamethasone, and Manual therapy.   PLAN FOR NEXT SESSION: Review HEP. Progress weekly HEP for core and postural strengthening.  Next session progress postural stability with additional cervical retraction and UE flexion with ab set, paloff,...  4th session review functional strengthening exercises ex: squat, lunge all with ab set.    Ihor Austin, LPTA/CLT; Delana Meyer [phone]  6:26 PM, 10/18/21

## 2021-10-25 ENCOUNTER — Encounter (HOSPITAL_COMMUNITY): Payer: Commercial Managed Care - PPO | Admitting: Physical Therapy

## 2021-10-25 ENCOUNTER — Telehealth (HOSPITAL_COMMUNITY): Payer: Self-pay | Admitting: Physical Therapy

## 2021-10-30 ENCOUNTER — Encounter: Payer: Self-pay | Admitting: Family Medicine

## 2021-10-30 ENCOUNTER — Telehealth: Payer: Self-pay | Admitting: Obstetrics & Gynecology

## 2021-10-30 NOTE — Telephone Encounter (Signed)
I had called the patient because we received documents from Dr. Buddy Duty and patient stated that she didn't need a follow up for the referral but would like a follow up U/S from when she seen you back in February for the pelvic pain. States that she understands that you didn't need to see her back until Oct  for a annual but for peace of mind she is wanting another ultra sound just to make sure everything is okay. Please advise. I did scan in the office notes that Dr. Buddy Duty sent over.  ?

## 2021-11-01 ENCOUNTER — Encounter (HOSPITAL_COMMUNITY): Payer: Self-pay

## 2021-11-01 ENCOUNTER — Ambulatory Visit (HOSPITAL_COMMUNITY): Payer: Commercial Managed Care - PPO | Attending: Nurse Practitioner

## 2021-11-01 DIAGNOSIS — M545 Low back pain, unspecified: Secondary | ICD-10-CM | POA: Insufficient documentation

## 2021-11-01 DIAGNOSIS — M542 Cervicalgia: Secondary | ICD-10-CM | POA: Diagnosis present

## 2021-11-01 NOTE — Therapy (Addendum)
OUTPATIENT PHYSICAL THERAPY THORACOLUMBAR EVALUATION   Patient Name: Marissa Lane MRN: [id number] DOB:September 15, 1990, 31 y.o., female Today's Date: 11/01/2021  PHYSICAL THERAPY DISCHARGE SUMMARY  Visits from Start of Care: 3  Current functional level related to goals / functional outcomes: See below    Remaining deficits: See below    Education / Equipment: See assessment   Patient agrees to discharge. Patient goals were met. Patient is being discharged due to meeting the stated rehab goals.    PT End of Session - 11/01/21 1609     Visit Number 3    Number of Visits 4    Date for PT Re-Evaluation 11/02/21    Authorization Type UMR/ UHC PPO (30VL)    PT Start Time 1532    PT Stop Time 1610    PT Time Calculation (min) 38 min    Activity Tolerance Patient tolerated treatment well;No increased pain    Behavior During Therapy [facility] for tasks assessed/performed               Past Medical History:  Diagnosis Date   Acne    Anxiety    Cancer ([place])    thyroid   Chest pain    Chronic hypertension    Diabetes mellitus without complication (HCC)    Dyspnea    Dysthymic disorder    over dose age 28   GERD (gastroesophageal reflux disease)    Gestational diabetes    metformin   H/O urinary frequency 06/25/2013   Irregular intermenstrual bleeding 10/12/2013   Lipoma    right scapula   Obesity    Palpitations    PCOS (polycystic ovarian syndrome)    Postpartum depression    Tobacco abuse    Past Surgical History:  Procedure Laterality Date   CHOLECYSTECTOMY  03/06/2012   Procedure: LAPAROSCOPIC CHOLECYSTECTOMY;  Surgeon: Donato Heinz, MD;  Location: [facility];  Service: General;  Laterality: N/A;   ESOPHAGOGASTRODUODENOSCOPY N/A 09/09/2012   Procedure: ESOPHAGOGASTRODUODENOSCOPY (EGD);  Surgeon: Rogene Houston, MD;  [facility];  Service: Endoscopy;  Laterality: N/A;  340   MASS EXCISION Right 03/23/2021   Procedure: EXCISION SOFT TISSUE MASS ON RIGHT  SHOULDER;  Surgeon: Armandina Gemma, MD;  [facility];  Service: General;  Laterality: Right;   MOUTH SURGERY     ROBOTIC ASSISTED LAPAROSCOPIC OVARIAN CYSTECTOMY Left 01/02/2021   Procedure: XI ROBOTIC ASSISTED LEFT LAPAROSCOPIC OVARIAN CYSTECTOMY;  Surgeon: Everitt Amber, MD;  [facility];  Service: Gynecology;  Laterality: Left;   ROBOTIC ASSISTED SALPINGO OOPHERECTOMY Right 01/02/2021   Procedure: XI ROBOTIC ASSISTED RIGHT SALPINGO OOPHORECTOMY;  Surgeon: Everitt Amber, MD;  [facility];  Service: Gynecology;  Laterality: Right;   SKIN GRAFT  2009   gum graft   THYROIDECTOMY N/A 03/23/2021   Procedure: TOTAL THYROIDECTOMY;  Surgeon: Armandina Gemma, MD;  [facility];  Service: General;  Laterality: N/A;   TONSILLECTOMY     Patient Active Problem List   Diagnosis Date Noted   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[place]) 03/23/2021   Mass of soft tissue of shoulder 03/20/2021   Papillary thyroid carcinoma ([place]) 03/20/2021   Lipoma of back 01/18/2021   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[place]) 01/08/2021   Cyst of right ovary 01/02/2021   Cyst of ovary, left 11/02/2020   Encounter for IUD insertion 11/02/2020   Pregnancy examination or test, negative result 11/02/2020   GAD (generalized anxiety disorder) 08/24/2019   Attention deficit hyperactivity disorder (ADHD), 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  Cervical high risk HPV (human papillomavirus) test positive 01/09/2017   History of gestational diabetes 07/19/2016   Chronic hypertension 04/30/2016   Smoker 03/05/2016   Migraine without aura and with status migrainosus, not intractable 04/06/2015   Anxiety 09/21/2013   PCOS (polycystic ovarian syndrome) 12/11/2012   Erosive esophagitis 11/05/2012   Morbid obesity ([place]) 09/14/2010    PCP: Kathyrn Drown, MD  REFERRING PROVIDER: Claire Shown, NP  REFERRING DIAG: M54.50 (ICD-10-CM) - Lumbar pain   THERAPY DIAG:  Low back pain, unspecified back pain laterality, unspecified chronicity,  unspecified whether sciatica present  Cervicalgia  ONSET DATE: February 2023  SUBJECTIVE:                                                                                                                                                                                           SUBJECTIVE STATEMENT: Pt stated it's been hard to get to all her exercises in.  Reports her neck has been feeling good this week.  Reports some stomach ache from abdominal soreness.   PAIN:  Are you having pain? No   PERTINENT HISTORY:  Hx of thyroid cancer   PAIN:  PAIN:  Are you having pain? No    PRECAUTIONS: None  WEIGHT BEARING RESTRICTIONS No  FALLS:  Has patient fallen in last 6 months? No, Number of falls:   LIVING ENVIRONMENT: Lives with: lives with their family Lives in: House/apartment  OCCUPATION: LPN  PLOF: Independent  PATIENT GOALS Learn exercises for prevention, and what to do    OBJECTIVE:   DIAGNOSTIC FINDINGS:  None  PATIENT SURVEYS:  Eval:FOTO 94% function     11/01/21: 94% functional   COGNITION:  Overall cognitive status: Within functional limits for tasks assessed    POSTURE:  Increased thoracic kyphosis, FHRS   PALPATION: Min TTP about bilateral upper trap  LUMBAR ROM:   Active  A/PROM  10/12/21 AROM 11/01/21  Flexion 15% limited WFL  Extension 25% limited WFL  Right lateral flexion WFL   Left lateral flexion WFL   Right rotation    Left rotation     (Blank rows = not tested) Cervical AROM: Flexion 40   Extension 45 45  Right lateral flexion    Left lateral flexion    Right rotation 63 73  Left rotation 64 75   LE MMT:  MMT Right 10/12/21 Left 11/01/21 Right  11/01/21 Left  11/01/21  Hip flexion 5 5 5/5 5/5  Hip extension 4+ 4+ 5/5 5/5  Hip abduction 4+ 4+ 5/5 5/5  Hip adduction      Hip internal rotation  Hip external rotation      Knee flexion      Knee extension 5 5 5/5 5/5  Ankle dorsiflexion 5 5 5/5 5/5  Ankle plantarflexion       Ankle inversion      Ankle eversion       (Blank rows = not tested)  UE MMT: 5/5 grossly   TODAY'S TREATMENT  11/01/21: AROM MMT Squat 10x Proper lifting 10 18# box GTB paloff Forward lunge 15x   10/17/21:  Supine: Diaphragmatic breathing x 2 min Diaphragmatic breathing paired with ab sets Marching with ab set 10x5" Bridge 10x   Seated: posterior pelvic tilt with ab set 10x 5"   Cervical retraction 10x 5"   Wback10x  Standing: lumbar extension    Ab set against wall 10x5"   RTB: scapular retraction, rows, shoulder extension.    10/12/21 Ab brace x5 Ab march x5 Bridge x5 Chin tuck x5 Scap retraction x5   PATIENT EDUCATION:  Education details: Reviewed goals, educated importance of HEP compliance for maximal benefits Person educated: Patient Education method: Customer service manager Education comprehension: verbalized understanding and returned demonstration   HOME EXERCISE PROGRAM: Access Code: [email or web address] URL: [email or web address] Date: 10/12/2021 Prepared by: Josue Hector  Exercises - Supine Transversus Abdominis Bracing - Hands on Stomach  - 2-3 x daily - 7 x weekly - 1-2 sets - 10 reps - 5 second hold - Supine March  - 2-3 x daily - 7 x weekly - 1-2 sets - 10 reps - Supine Bridge  - 2-3 x daily - 7 x weekly - 1-2 sets - 10 reps - 5 second hold - Seated Passive Cervical Retraction  - 2-3 x daily - 7 x weekly - 1-2 sets - 10 reps - Seated Scapular Retraction  - 2-3 x daily - 7 x weekly - 1-2 sets - 10 reps - 5 second hold  3/30:  GTB scapular retraction, row and extension   ASSESSMENT:  CLINICAL IMPRESSION: Reviewed goals, AROM, FOTO, MMT complete.  Pt met all goals.  Session focus on functional strengthening to improve mechanics to reduce risk of injury.  Pt able to demonstrate appropriate mechanics with all exercises.    OBJECTIVE IMPAIRMENTS Abnormal gait, decreased activity tolerance, decreased ROM, decreased strength,  increased fascial restrictions, improper body mechanics, postural dysfunction, and pain.   ACTIVITY LIMITATIONS cleaning, community activity, meal prep, occupation, laundry, yard work, shopping, and yard work.   PERSONAL FACTORS  None  are also affecting patient's functional outcome.    REHAB POTENTIAL: Excellent  CLINICAL DECISION MAKING: Stable/uncomplicated  EVALUATION COMPLEXITY: Moderate   GOALS: SHORT TERM GOALS: Target date: 11/15/2021  Patient will be independent with initial HEP and self-management strategies to improve functional outcomes Baseline:  Goal status: Acheived   LONG TERM GOALS: Target date: 11/29/2021  Patient will be independent with advanced HEP and self-management strategies to improve functional outcomes Baseline:  Goal status: Acheived  2.  Patient will report consistent (3 consecutive days) reduction of neck and back pain to 0/10 at rest for improved quality of life and ability to perform ADLs  Baseline:  Goal status: Acheived  3. Patient will report at least 75% overall improvement in subjective complaint to indicate improvement in ability to perform ADLs. Baseline:  Reports 100% improvements Goal status: Acheived    PLAN: PT FREQUENCY: 1x/week  PT DURATION: 3 weeks  PLANNED INTERVENTIONS: Therapeutic exercises, Therapeutic activity, Neuromuscular re-education, Balance training, Gait training, Patient/Family education, Joint manipulation, Joint mobilization, Stair training, Aquatic  Therapy, Dry Needling, Electrical stimulation, Spinal manipulation, Spinal mobilization, Cryotherapy, Moist heat, scar mobilization, Taping, Traction, Ultrasound, Biofeedback, Ionotophoresis 52m/ml Dexamethasone, and Manual therapy.   PLAN FOR NEXT SESSION: DC to [place] LPTA/CLT; CBIS [phone] 4:10 PM, 11/01/21

## 2021-11-27 NOTE — Telephone Encounter (Signed)
Pt stated she did not have a babysitter for her appt today.  ?

## 2021-11-30 ENCOUNTER — Encounter: Payer: Self-pay | Admitting: Nurse Practitioner

## 2021-11-30 ENCOUNTER — Ambulatory Visit (INDEPENDENT_AMBULATORY_CARE_PROVIDER_SITE_OTHER): Payer: Commercial Managed Care - PPO | Admitting: Nurse Practitioner

## 2021-11-30 VITALS — Temp 97.9°F | Ht 64.0 in | Wt 216.4 lb

## 2021-11-30 DIAGNOSIS — J069 Acute upper respiratory infection, unspecified: Secondary | ICD-10-CM | POA: Diagnosis not present

## 2021-11-30 MED ORDER — AZITHROMYCIN 250 MG PO TABS: ORAL_TABLET | ORAL | 0 refills | Status: DC

## 2021-11-30 NOTE — Progress Notes (Signed)
? ?  Subjective:  ? ? Patient ID: Marissa Lane, female    DOB: June 10, 1991, 31 y.o.   MRN: [id number] ? ?Cough ?This is a new problem. The current episode started in the past 7 days. Associated symptoms include ear congestion, ear pain and nasal congestion.  ?States her symptoms have improved overall.  Still producing green-yellow mucus when blowing her nose or coughing.  No wheezing.  Having some left ear popping and pressure with slight muffled sound at times.  No fever or sore throat.  No wheezing.  Taking fluids well.  Voiding normal limit.  Smokes half to 3/4 pack/day. ? ? ?Review of Systems  ?HENT:  Positive for ear pain.   ?Respiratory:  Positive for cough.   ? ?   ?Objective:  ? Physical Exam ?NAD.  Alert, oriented.  TMs clear effusion, no erythema.  Pharynx minimal erythema with PND noted.  Neck supple with mild soft anterior adenopathy.  Lungs clear.  Heart regular rate rhythm. ? ?Today's Vitals  ? 11/30/21 1603  ?Temp: 97.9 ?F (36.6 ?C)  ?TempSrc: Oral  ?Weight: 216 lb 6.4 oz (98.2 kg)  ?Height: '5\' 4"'$  (1.626 m)  ? ?Body mass index is 37.14 kg/m?. ? ? ? ? ?   ?Assessment & Plan:  ?Acute URI ? ?Reviewed symptomatic care and warning signs.  A prescription for Z-Pak was sent into the pharmacy to have on hold for the weekend in case her symptoms worsen.  Do not feel it is necessary at this time since her symptoms are improving but some concerns because she is a smoker.  Given samples of Mucinex DM for cough. ? ?

## 2021-12-04 ENCOUNTER — Ambulatory Visit: Payer: Commercial Managed Care - PPO | Admitting: Family Medicine

## 2021-12-06 MED ORDER — LIDOCAINE HCL (PF) 2 % IJ SOLN
INTRAMUSCULAR | Status: AC
  Filled 2021-12-06: qty 5

## 2021-12-06 MED ORDER — ONDANSETRON HCL 4 MG/2ML IJ SOLN
INTRAMUSCULAR | Status: AC
  Filled 2021-12-06: qty 2

## 2021-12-06 MED ORDER — EPHEDRINE 5 MG/ML INJ
INTRAVENOUS | Status: AC
  Filled 2021-12-06: qty 5

## 2021-12-06 MED ORDER — FENTANYL CITRATE (PF) 100 MCG/2ML IJ SOLN
INTRAMUSCULAR | Status: AC
Start: 2021-12-06 — End: ?
  Filled 2021-12-06: qty 2

## 2021-12-06 MED ORDER — MIDAZOLAM HCL 2 MG/2ML IJ SOLN
INTRAMUSCULAR | Status: AC
  Filled 2021-12-06: qty 2

## 2021-12-07 ENCOUNTER — Encounter: Payer: Self-pay | Admitting: Nurse Practitioner

## 2021-12-07 ENCOUNTER — Ambulatory Visit (INDEPENDENT_AMBULATORY_CARE_PROVIDER_SITE_OTHER): Payer: Commercial Managed Care - PPO | Admitting: Nurse Practitioner

## 2021-12-07 VITALS — BP 138/90 | HR 81 | Temp 98.0°F | Wt 216.0 lb

## 2021-12-07 DIAGNOSIS — Z79899 Other long term (current) drug therapy: Secondary | ICD-10-CM

## 2021-12-07 DIAGNOSIS — F902 Attention-deficit hyperactivity disorder, combined type: Secondary | ICD-10-CM | POA: Diagnosis not present

## 2021-12-07 MED ORDER — AMPHETAMINE-DEXTROAMPHET ER 10 MG PO CP24: 10.0000 mg | ORAL_CAPSULE | Freq: Every day | ORAL | 0 refills | Status: DC

## 2021-12-07 NOTE — Progress Notes (Signed)
   Subjective:    Patient ID: Marissa Lane, female    DOB: [birth year], 31 y.o.   MRN: [id number]  HPI  Patient here to discuss weight loss and ADHD.  Has been struggling with her diet, states she has been craving sweets and has been doing some binge eating at times.  Has taken phentermine twice which worked well but the second time had to stop it due to increased anxiety and agitation. Was on Intuniv for her ADHD, would like to start another medication.  Has been having some difficulty focusing and organizing at work, has been able to adapt by using lists and other measures but feel her ADHD needs to be treated again.  Review of Systems  Constitutional:  Positive for appetite change and fatigue.  Respiratory:  Negative for cough, chest tightness, shortness of breath and wheezing.   Cardiovascular:  Negative for chest pain.       Heart rate at rest in the 70s but will occasionally go up a little higher into the low 100s.  Psychiatric/Behavioral:  Positive for decreased concentration.       Objective:   Physical Exam NAD.  Alert, oriented.  Calm cheerful affect.  Making good eye contact.  Dressed appropriately for the weather.  Speech clear.  Thoughts logical coherent and relevant.  Lungs clear.  Heart regular rate rhythm.  No murmur or gallop noted.  EKG NSR.  Today's Vitals   12/07/21 1514  BP: 138/90  Pulse: 81  Temp: 98 F (36.7 C)  TempSrc: Temporal  SpO2: 98%  Weight: 216 lb (98 kg)   Body mass index is 37.08 kg/m.        Assessment & Plan:   Problem List Items Addressed This Visit       Other   Attention deficit hyperactivity disorder (ADHD), combined type - Primary   Other Visit Diagnoses     High risk medication use       Relevant Orders   EKG 12-Lead (Completed)      Meds ordered this encounter  Medications   amphetamine-dextroamphetamine (ADDERALL XR) 10 MG 24 hr capsule    Sig: Take 1 capsule (10 mg total) by mouth daily.    Dispense:  30 capsule     Refill:  0    Order Specific Question:   Supervising Provider    Answer:   Sallee Lange A [[id number]]   Discussed medication options.  Patient will be without insurance for the month of June and then switching to a new plan after that.  It is unclear what medications will be covered.  Ideally would like to use Vyvanse for binge eating as well as ADHD but it is unlikely that her insurance will cover this especially being namebrand medication. We will start with Adderall XR 10 mg each morning.  Reviewed potential adverse effects.  Discontinue medication and contact office if any problems. Patient to call back or send a message in 1 month to let us know if she needs to change the dosage or make further adjustments. Return in about 3 months (around 03/09/2022).

## 2022-01-08 ENCOUNTER — Encounter: Payer: Self-pay | Admitting: Nurse Practitioner

## 2022-01-14 ENCOUNTER — Other Ambulatory Visit: Payer: Self-pay | Admitting: Nurse Practitioner

## 2022-01-14 MED ORDER — AMPHETAMINE-DEXTROAMPHET ER 20 MG PO CP24: 20.0000 mg | ORAL_CAPSULE | ORAL | 0 refills | Status: DC

## 2022-02-20 ENCOUNTER — Other Ambulatory Visit: Payer: Self-pay | Admitting: Nurse Practitioner

## 2022-02-22 MED ORDER — AMPHETAMINE-DEXTROAMPHET ER 20 MG PO CP24: 20.0000 mg | ORAL_CAPSULE | ORAL | 0 refills | Status: DC

## 2022-02-25 ENCOUNTER — Other Ambulatory Visit: Payer: Commercial Managed Care - PPO

## 2022-03-27 ENCOUNTER — Other Ambulatory Visit: Payer: Self-pay | Admitting: Obstetrics & Gynecology

## 2022-03-27 DIAGNOSIS — N83209 Unspecified ovarian cyst, unspecified side: Secondary | ICD-10-CM

## 2022-03-28 ENCOUNTER — Ambulatory Visit (INDEPENDENT_AMBULATORY_CARE_PROVIDER_SITE_OTHER): Payer: Self-pay | Admitting: Obstetrics & Gynecology

## 2022-03-28 ENCOUNTER — Other Ambulatory Visit: Payer: Self-pay | Admitting: Obstetrics & Gynecology

## 2022-03-28 ENCOUNTER — Other Ambulatory Visit (HOSPITAL_COMMUNITY)
Admission: RE | Admit: 2022-03-28 | Discharge: 2022-03-28 | Disposition: A | Discharge status: Home / Self Care | Payer: Self-pay | Source: Ambulatory Visit | Attending: Obstetrics & Gynecology | Admitting: Obstetrics & Gynecology

## 2022-03-28 ENCOUNTER — Ambulatory Visit (INDEPENDENT_AMBULATORY_CARE_PROVIDER_SITE_OTHER): Payer: Self-pay

## 2022-03-28 ENCOUNTER — Encounter: Payer: Self-pay | Admitting: Obstetrics & Gynecology

## 2022-03-28 VITALS — BP 135/90 | HR 104 | Ht 64.0 in | Wt 228.0 lb

## 2022-03-28 DIAGNOSIS — Z01419 Encounter for gynecological examination (general) (routine) without abnormal findings: Secondary | ICD-10-CM | POA: Insufficient documentation

## 2022-03-28 DIAGNOSIS — N9489 Other specified conditions associated with female genital organs and menstrual cycle: Secondary | ICD-10-CM

## 2022-03-28 DIAGNOSIS — N83209 Unspecified ovarian cyst, unspecified side: Secondary | ICD-10-CM

## 2022-03-28 DIAGNOSIS — C561 Malignant neoplasm of right ovary: Secondary | ICD-10-CM

## 2022-03-28 DIAGNOSIS — Z975 Presence of (intrauterine) contraceptive device: Secondary | ICD-10-CM

## 2022-03-28 NOTE — Progress Notes (Signed)
WELL-WOMAN EXAMINATION Patient name: Marissa Lane MRN [id number]  Date of birth: 09-05-1990 Chief Complaint:   Gynecologic Exam  History of Present Illness:   Marissa Lane is a 31 y.o. [id number] female being seen today for a routine well-woman exam and follow up regarding the following:  -Ovarian strumii- s/p right oophorectomy.  Plan was repeat US every 6 mos x 2 yrs.  She has noted pelvic pain on her left side.  Pain is intermittent, sometimes positional.  Does not require medication, but she does try to avoid meds if possible.  No nausea/vomiting.  Pain seems to be worse if constipated.  Pt also followed by Dr. Buddy Duty  Menses are irregular- typically light; however a few periods ago noted moderate to heavy bleeding and cramping- but only that one period.     No LMP recorded. (Menstrual status: IUD).  The current method of family planning is IUD.    Last pap 2020.  Last mammogram: n/a. Last colonoscopy: n/a     03/28/2022    1:36 PM 04/19/2021   11:50 AM 08/21/2020    4:00 PM 08/24/2019    8:55 AM 04/27/2019    1:59 PM  Depression screen PHQ 2/9  Decreased Interest 0 0 0 3 0  Down, Depressed, Hopeless 1 0 0 3 1  PHQ - 2 Score 1 0 0 6 1  Altered sleeping 0 '1 1 3   '$ Tired, decreased energy '3 1 1 3   '$ Change in appetite 3 0 1 3   Feeling bad or failure about yourself  1 0 1 3   Trouble concentrating 0 '1 1 3   '$ Moving slowly or fidgety/restless 0 0 0 1   Suicidal thoughts 0 0 0 0   PHQ-9 Score '8 3 5 22   '$ Difficult doing work/chores  Somewhat difficult  Very difficult       Review of Systems:   Pertinent items are noted in HPI Denies any headaches, blurred vision, fatigue, shortness of breath, chest pain, abdominal pain, bowel movements, urination, or intercourse unless otherwise stated above.  Pertinent History Reviewed:  Reviewed past medical,surgical, social and family history.  Reviewed problem list, medications and allergies. Physical Assessment:   Vitals:   03/28/22  1332  BP: (!) 135/90  Pulse: (!) 104  Weight: 228 lb (103.4 kg)  Height: '5\' 4"'$  (1.626 m)  Body mass index is 39.14 kg/m.        Physical Examination:   General appearance - well appearing, and in no distress  Mental status - alert, oriented to person, place, and time  Psych:  She has a normal mood and affect  Skin - warm and dry, normal color, no suspicious lesions noted  Chest - effort normal, all lung fields clear to auscultation bilaterally  Heart - normal rate and regular rhythm  Neck:  midline trachea, no thyromegaly or nodules  Breasts - breasts appear normal, no suspicious masses, no skin or nipple changes or  axillary nodes  Abdomen - soft, no reproducible tenderness on left,  nondistended, no masses   Pelvic - VULVA: normal appearing vulva with no masses, tenderness or lesions  VAGINA: normal appearing vagina with normal color and discharge, no lesions  CERVIX: normal appearing cervix without discharge or lesions, no CMT- strings not well visualized  Thin prep pap is done with HR HPV cotesting  UTERUS: uterus is felt to be normal size, shape, consistency and nontender   ADNEXA: left adnexal fullness with some tenderness noted  Extremities:  No swelling or varicosities noted  Chaperone:  pt declined      Assessment & Plan:  1) Well-Woman Exam -pap collected, reviewed guidelines  2) Left ovarian cyst -while unilocular cysts remain stable, new hypoechoic vascular portion noted -additionally pt noting discomfort on that side -reviewed risk/benefit of continued observation vs surgical intervention -recommendation to consider surgical intervention with gyn/oncology   Meds: No orders of the defined types were placed in this encounter.   Follow-up: Next step with GYN/oncology Return in about 1 year (around 03/29/2023) for Annual.   Janyth Pupa, DO Attending [place], [place] for [facility], [place]

## 2022-03-28 NOTE — Progress Notes (Signed)
[place] TA/TV: homogeneous anteverted uterus,WNL,EEC 12.8 mm,IUD is centrally located within the endometrium,small amount of simple cul de sac fluid,right oophorectomy,simple unilocular left ovarian cyst 3.1 x 2.8 x 2.9 cm,1.2 x 1 x .7cm non shadowing echogenic mass,1.7 x 1.3 x 1.5 cm solid hypoechoic vascular mass   Chaperone Peggy

## 2022-03-29 ENCOUNTER — Encounter: Payer: Self-pay | Admitting: Family Medicine

## 2022-04-01 ENCOUNTER — Encounter: Payer: Self-pay | Admitting: Obstetrics & Gynecology

## 2022-04-02 ENCOUNTER — Encounter: Payer: Self-pay | Admitting: Obstetrics & Gynecology

## 2022-04-02 ENCOUNTER — Encounter: Payer: Self-pay | Admitting: Psychiatry

## 2022-04-03 LAB — CYTOLOGY - PAP
Adequacy: ABSENT
Comment: NEGATIVE
Diagnosis: NEGATIVE
High risk HPV: NEGATIVE

## 2022-04-08 ENCOUNTER — Encounter: Payer: Self-pay | Admitting: Psychiatry

## 2022-04-08 ENCOUNTER — Inpatient Hospital Stay: Payer: Self-pay | Attending: Psychiatry | Admitting: Psychiatry

## 2022-04-08 ENCOUNTER — Other Ambulatory Visit: Payer: Self-pay

## 2022-04-08 VITALS — BP 148/100 | HR 112 | Temp 98.7°F | Resp 20 | Ht 64.0 in | Wt 231.0 lb

## 2022-04-08 DIAGNOSIS — Z975 Presence of (intrauterine) contraceptive device: Secondary | ICD-10-CM | POA: Insufficient documentation

## 2022-04-08 DIAGNOSIS — F17210 Nicotine dependence, cigarettes, uncomplicated: Secondary | ICD-10-CM

## 2022-04-08 DIAGNOSIS — Z8543 Personal history of malignant neoplasm of ovary: Secondary | ICD-10-CM

## 2022-04-08 DIAGNOSIS — F40240 Claustrophobia: Secondary | ICD-10-CM

## 2022-04-08 DIAGNOSIS — C561 Malignant neoplasm of right ovary: Secondary | ICD-10-CM | POA: Insufficient documentation

## 2022-04-08 DIAGNOSIS — Z90721 Acquired absence of ovaries, unilateral: Secondary | ICD-10-CM | POA: Insufficient documentation

## 2022-04-08 DIAGNOSIS — R978 Other abnormal tumor markers: Secondary | ICD-10-CM

## 2022-04-08 DIAGNOSIS — R7989 Other specified abnormal findings of blood chemistry: Secondary | ICD-10-CM | POA: Insufficient documentation

## 2022-04-08 DIAGNOSIS — R935 Abnormal findings on diagnostic imaging of other abdominal regions, including retroperitoneum: Secondary | ICD-10-CM | POA: Insufficient documentation

## 2022-04-08 MED ORDER — ALPRAZOLAM 0.5 MG PO TABS: 0.5000 mg | ORAL_TABLET | Freq: Once | ORAL | 0 refills | Status: DC

## 2022-04-08 NOTE — Progress Notes (Addendum)
Gynecologic Oncology Return Clinic Visit  Date of Service: 04/08/2022 Referring Provider: Janyth Pupa, DO   Assessment & Plan: Marissa Lane is a 31 y.o. woman with a history of malignant ovarian strumii, most recently with a pelvic ultrasound with a new finding of a 1.7 cm solid hypoechoic vascular lesion.   Discussed with patient that it is unclear whether this new finding on ultrasound represents a benign or malignant process representing recurrence of her malignant ovarian strumii.  At patient's last surgery, she had multiple cysts removed from the left ovary which were mature teratoma's.  Based on my review of the imaging, discussed with patient that the smaller echogenic lesion could possibly represent another dermoid cyst.  However, the 1.7 cm lesion of concern does not have classic findings consistent with a dermoid.  Patient does likely ovulate given that her contraception is a Mirena IUD.  She reports that her last period was in the middle of August.  One possible etiology of this lesion could be a collapsing corpus luteum.  However, her endocrinologist has been following her thyroglobulin which was slightly higher on most recent check than prior.  Discussed that the most definitive evaluation of her ovarian lesion would be surgery.  Given the size of the lesion, it would be difficult to identify this exact lesion and perform a cystectomy.  In that case an oophorectomy would need to be performed to definitively remove the lesion of question.  However, given her prior right oophorectomy, this would put her into surgical menopause. Patient also uncertain of her future fertility desires. Discussed that if an oophorectomy performed, she could received hormone replacement. If uninvolved, her uterus could remain in situ and she could still carry a pregnancy although this would require oocyte donation and IVF.   Discussed with patient that we could repeat imaging for further characterization of  this new lesion prior to making definitive decision regarding surgery.  I discussed that I also would reach out to her endocrinologist to better gauge his level of concern about her recent change in thyroglobulin level.  We discussed options for pelvic MRI now versus repeat pelvic ultrasound in about 6 weeks from her prior ultrasound.  Patient wishes to proceed with pelvic MRI.  We will work to get this scheduled.  Patient understands that if any persistent concerns based on MRI or from her endocrinologist, I may more strongly recommend that we proceed with surgery.  MRI in approximately 2 weeks.  Will discuss results with patient, and next steps, when available.   Bernadene Bell, MD Gynecologic Oncology   Medical Decision Making I personally spent  TOTAL 38 minutes face-to-face and non-face-to-face in the care of this patient, which includes all pre, intra, and post visit time on the date of service.  8 minutes spent reviewing records prior to the visit 25 Minutes in patient contact 5 minutes charting , conferring with consultants etc.   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 OVARIAN CYST, LEFT, #1, EXCISION:  -  Mature teratoma  -  See comment   C. OVARIAN CYST, LEFT, #2, EXCISION:  -  Mature teratoma  -  See comment    03/23/2021 Surgery    SURGICAL PATHOLOGY  CASE: [id number]  1. Total  thyroidectomy  2. Excision of soft tissue tumor right posterior shoulder, 16 x 10 x 5 cm, subcutaneous   FINAL MICROSCOPIC DIAGNOSIS:   A. LIPOMA, RIGHT POSTERIOR SHOULDER, EXCISION:  -  Mature adipose tissue consistent with lipoma   B. THYROID, TOTAL, THYROIDECTOMY:  -  Benign thyroid  -  No carcinoma identified      Interval History: Patient was last seen on 01/23/2021 by Dr. Denman George, at which time she was recommended to undergo consideration of thyroidectomy and establish care with endocrinology/general surgery for this care.   More recently she was seen by her OB/GYN, Dr. Nelda Marseille, on 03/28/2022.  At that time she noted intermittent left sided pelvic pain.  Pelvic ultrasound noted a stable simple unilocular 3.1 cm left ovarian cyst as well as a new 1.2 cm nonshadowing echogenic mass and a 1.7 x 1.3 x 1.5 cm solid hypoechoic vascular mass.  Today, patient presents with her husband.  She reports that she has been having intermittent yet persistent left lower quadrant pain since follow-up from her ovarian surgery.  She feels that this has been slightly increased over the last couple months.  Pain is dull and achy and occurs most every day.  She feels that it is made worse when she needs to urinate or defecate.  She has been following with Dr. Buddy Duty in endocrinology.  She reports that the marker he has been following primarily for her cancer has been thyroglobulin which was 1.5 on 04/16/2022, down to 0.4 on 10/24/2021, and slightly increased to 1.9 on 03/29/2022, with repeat on 04/08/2022 of 2.0.  Given this and the new solid 1.7 cm lesion on the ultrasound, the question has been as if this represents recurrence.  Patient has a Mirena IUD in place.  She reports that her last menstrual period was middle of August.  She denies headache, vision change, current dizziness.   Past Medical/Surgical History: Past Medical History:  Diagnosis Date   Acne    Anxiety    Cancer ([place])    thyroid   Chest pain     Chronic hypertension    Diabetes mellitus without complication (HCC)    Dyspnea    Dysthymic disorder    over dose age 89   GERD (gastroesophageal reflux disease)    Gestational diabetes    metformin   H/O urinary frequency 06/25/2013   Irregular intermenstrual bleeding 10/12/2013   Lipoma    right scapula   Obesity    Palpitations    PCOS (polycystic ovarian syndrome)    Postpartum depression    Tobacco abuse     Past Surgical History:  Procedure Laterality Date   CHOLECYSTECTOMY  03/06/2012   Procedure: LAPAROSCOPIC CHOLECYSTECTOMY;  Surgeon: Donato Heinz, MD;  Location: [facility];  Service: General;  Laterality: N/A;   ESOPHAGOGASTRODUODENOSCOPY N/A 09/09/2012   Procedure: ESOPHAGOGASTRODUODENOSCOPY (EGD);  Surgeon: Rogene Houston, MD;  Location: [facility];  Service: Endoscopy;  Laterality: N/A;  340   MASS EXCISION Right 03/23/2021   Procedure: EXCISION SOFT TISSUE MASS ON RIGHT SHOULDER;  Surgeon: Armandina Gemma, MD;  Location: [facility];  Service: General;  Laterality: Right;   MOUTH SURGERY     ROBOTIC ASSISTED LAPAROSCOPIC OVARIAN CYSTECTOMY Left 01/02/2021   Procedure: XI ROBOTIC  ASSISTED LEFT LAPAROSCOPIC OVARIAN CYSTECTOMY;  Surgeon: Everitt Amber, MD;  Location: [facility];  Service: Gynecology;  Laterality: Left;   ROBOTIC ASSISTED SALPINGO OOPHERECTOMY Right 01/02/2021   Procedure: XI ROBOTIC ASSISTED RIGHT SALPINGO OOPHORECTOMY;  Surgeon: Everitt Amber, MD;  Location: [facility];  Service: Gynecology;  Laterality: Right;   SKIN GRAFT  2009   gum graft   THYROIDECTOMY N/A 03/23/2021   Procedure: TOTAL THYROIDECTOMY;  Surgeon: Armandina Gemma, MD;  Location: [facility];  Service: General;  Laterality: N/A;   TONSILLECTOMY      Family History  Adopted: Yes  Problem Relation Age of Onset   Congestive Heart Failure Mother    Irritable bowel syndrome Mother    Ulcers Mother    Anemia Mother    Drug abuse Mother    Hypertension Brother    Colon cancer Maternal Grandmother 65   Anxiety  disorder Maternal Grandmother    Heart disease Maternal Grandfather 66       MI    Social History   Socioeconomic History   Marital status: Married    Spouse name: Nile Dorning   Number of children: 3   Years of education: Not on file   Highest education level: Associate degree: occupational, Hotel manager, or vocational program  Occupational History   Occupation: LPN  Tobacco Use   Smoking status: Every Day    Packs/day: 1.00    Years: 10.00    Total pack years: 10.00    Types: Cigarettes   Smokeless tobacco: Never  Vaping Use   Vaping Use: Never used  Substance and Sexual Activity   Alcohol use: No   Drug use: No   Sexual activity: Yes    Birth control/protection: I.U.D.  Other Topics Concern   Not on file  Social History Narrative   Not on file   Social Determinants of Health   Financial Resource Strain: Low Risk  (03/28/2022)   Overall Financial Resource Strain (CARDIA)    Difficulty of Paying Living Expenses: Not very hard  Food Insecurity: No Food Insecurity (03/28/2022)   Hunger Vital Sign    Worried About Running Out of Food in the Last Year: Never true    Ran Out of Food in the Last Year: Never true  Transportation Needs: No Transportation Needs (08/21/2020)   PRAPARE - Hydrologist (Medical): No    Lack of Transportation (Non-Medical): No  Physical Activity: Inactive (03/28/2022)   Exercise Vital Sign    Days of Exercise per Week: 0 days    Minutes of Exercise per Session: 0 min  Stress: No Stress Concern Present (03/28/2022)   [place]    Feeling of Stress : Only a little  Social Connections: Moderately Integrated (03/28/2022)   Social Connection and Isolation Panel [NHANES]    Frequency of Communication with Friends and Family: More than three times a week    Frequency of Social Gatherings with Friends and Family: Once a week    Attends Religious Services: 1 to 4  times per year    Active Member of [employer] or Organizations: No    Attends Music therapist: Never    Marital Status: Married    Current Medications:  Current Outpatient Medications:    levonorgestrel (MIRENA) 20 MCG/24HR IUD, 1 each by Intrauterine route once., Disp: , Rfl:    levothyroxine (SYNTHROID) 200 MCG tablet, Take 200 mcg by mouth every morning. 175 alternating with 200, Disp: ,  Rfl:   Review of Symptoms: Complete 10-system review is notable for occasional constipation, pelvic pain, fatigue, occasional pain with sexual intercourse, 20-25+ weight gain, vaginal discharge  Physical Exam: BP (!) 150/110 (BP Location: Left Arm, Patient Position: Sitting) Comment: taken manual, MF notified....pt is having dizziness  Pulse (!) 112 Comment: MD notified  Temp 98.7 F (37.1 C)   Resp 20   Ht '5\' 4"'$  (1.626 m)   Wt 231 lb (104.8 kg)   BMI 39.65 kg/m  General: Alert, oriented, no acute distress. HEENT: Normocephalic, atraumatic. Neck symmetric without masses. Sclera anicteric.  Chest: Normal work of breathing.  Cardiovascular: Sinus tachycardia Abdomen: Soft, nontender.   Extremities: Grossly normal range of motion.  Warm, well perfused.  No edema bilaterally. Skin: No rashes or lesions noted. Lymphatics: No cervical, supraclavicular, or inguinal adenopathy. GU: Normal appearing external genitalia without erythema, excoriation, or lesions.  Speculum exam reveals normal cervix and vaginal mucosa.  Bimanual exam reveals uterus.  No adnexal masses palpated, although exam limited by body habitus.   Exam chaperoned by Joylene John, NP  Laboratory & Radiologic Studies:   US Transvaginal Non-OB 03/28/2022  Narrative Images from the original result were not included.    ..an Sales executive of Ultrasound Medicine Diplomatic Services operational officer) accredited practice  Center for [facility] [place] [address]    Ordering Provider: Janyth Pupa, DO  GYNECOLOGIC SONOGRAM   Marissa Lane is a 31 y.o. [id number] No LMP recorded. (Menstrual status: IUD). She is here for a pelvic sonogram to follow up left ovarian cyst. HX of right oophorectomy,papillary thyroid carcinoma  Uterus                      6.2 x 5.7 x 5.9 cm, Total uterine volume 109 cc,homogeneous anteverted uterus,WNL  Endometrium          12.8 mm, symmetrical, IUD is centrally located within the endometrium  Right ovary            right oophorectomy,right adnexa WNL  Left ovary               5.1 x  x 3.7 X 3.4 cm,simple unilocular left ovarian cyst 3.1 x 2.8 x 2.9 cm,1.2 x 1 x .7cm non shadowing echogenic mass,1.7 x 1.3 x 1.5 cm solid hypoechoic vascular mass   Technician Comments: [place] TA/TV: homogeneous anteverted uterus,WNL,EEC 12.8 mm,IUD is centrally located within the endometrium,small amount of simple cul de sac fluid,right oophorectomy,simple unilocular left ovarian cyst 3.1 x 2.8 x 2.9 cm,1.2 x 1 x .7cm non shadowing echogenic mass,1.7 x 1.3 x 1.5 cm solid hypoechoic vascular mass  Chaperone [address] Heide Guile 03/28/2022 2:27 PM  Clinical Impression and recommendations:  I have reviewed the sonogram results above, combined with the patient's current clinical course, below are my impressions and any appropriate recommendations for management based on the sonographic findings.  Normal uterine size and shape. Normal endometrium with IUD centrally located. Left ovary with unilocular 3.1 x 2.8 x 2.9 cm cyst and 1.2 x 1 x .7cm non shadowing echogenic mass- both were previously seen and same size as previously.  Additional 1.7x1.3x1.5cm solid hypoechoic vascular mass noted.  Recommend surgical intervention due to new suspicious finding and prior history of struma ovarii. Based on the US findings, O-RADS Cat 5  (O-RADS [place] Risk Stratification and Management System: A Consensus Guidelines from the ACR Ovarian-Adnexal Reporting and Data System, Radiology 2020;  [phone])

## 2022-04-08 NOTE — Patient Instructions (Signed)
It was a pleasure to see you in clinic today. -We discussed the options to go ahead and proceed with surgery, versus further work-up with imaging first.  We discussed repeat pelvic ultrasound versus pelvic MRI.  We will try to get a pelvic MRI scheduled in the next few weeks. -I will also reach out to your endocrinologist to discuss your care.  I would let you know if, based on his level concern, we should proceed with surgery regardless of imaging.  Thank you very much for allowing me to provide care for you today.  I appreciate your confidence in choosing our Gynecologic Oncology team at [facility].  If you have any questions about your visit today please call our office or send [place] a MyChart message and we will get back to you as soon as possible.

## 2022-04-09 ENCOUNTER — Encounter: Payer: Self-pay | Admitting: Family Medicine

## 2022-04-10 NOTE — Telephone Encounter (Signed)
Nurses Please reach out to Marissa Lane It would be best to do a office visit this coming week if possible with me on open slot.  I will do the best I can charging the lowest office visit code there is plus [facility] should be able to do a discount to make it more affordable  At that time I will check her blood pressure and she and I can have a shared discussion regarding medication options, cost related issues etc.

## 2022-04-10 NOTE — Telephone Encounter (Signed)
Spoke with patient on the phone she made appt with Dr Nicki Reaper on 04/15/2022 at 11:20

## 2022-04-10 NOTE — Telephone Encounter (Signed)
Left message to return call 

## 2022-04-15 ENCOUNTER — Ambulatory Visit (INDEPENDENT_AMBULATORY_CARE_PROVIDER_SITE_OTHER): Payer: Self-pay | Admitting: Family Medicine

## 2022-04-15 ENCOUNTER — Other Ambulatory Visit: Payer: Self-pay | Admitting: Gynecologic Oncology

## 2022-04-15 ENCOUNTER — Encounter: Payer: Self-pay | Admitting: Psychiatry

## 2022-04-15 ENCOUNTER — Telehealth: Payer: Self-pay

## 2022-04-15 ENCOUNTER — Encounter: Payer: Self-pay | Admitting: Family Medicine

## 2022-04-15 VITALS — BP 138/88 | HR 91 | Wt 230.0 lb

## 2022-04-15 DIAGNOSIS — N83209 Unspecified ovarian cyst, unspecified side: Secondary | ICD-10-CM

## 2022-04-15 DIAGNOSIS — I10 Essential (primary) hypertension: Secondary | ICD-10-CM

## 2022-04-15 DIAGNOSIS — C561 Malignant neoplasm of right ovary: Secondary | ICD-10-CM

## 2022-04-15 IMAGING — CT CT ABD-PELV W/ CM
2 of 4 series · 14 of 46 positions shown, 16 images · IV contrast (Omnipaque or Isovue)
Comparison: CT 11/06/2013

CLINICAL DATA: Diverticulitis suspected

EXAM:
CT ABDOMEN AND PELVIS WITH CONTRAST
TECHNIQUE: Multidetector CT imaging of the abdomen and pelvis was performed
using the standard protocol following bolus administration of
intravenous contrast.
CONTRAST:  100mL OMNIPAQUE IOHEXOL 300 MG/ML  SOLN

[Series 2: axial st · axial · 0.72mm/px · z∈[+850,+1276]mm · 11 of 95 slices shown, 13 images]
[im 5/95  soft-tissue]
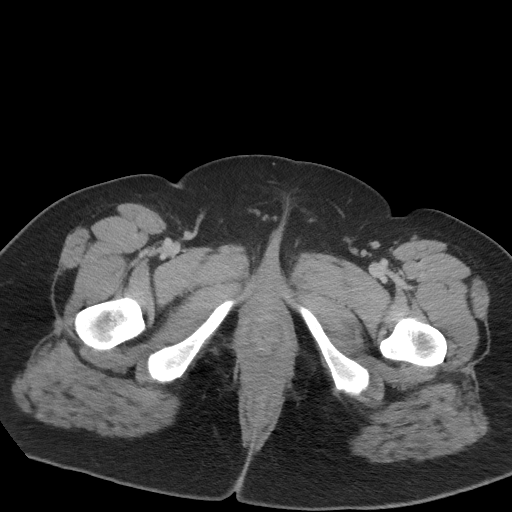
[im 5/95  bone]
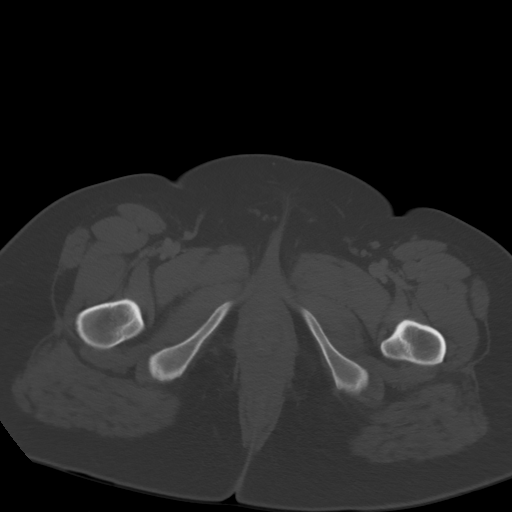
[im 13/95  soft-tissue]
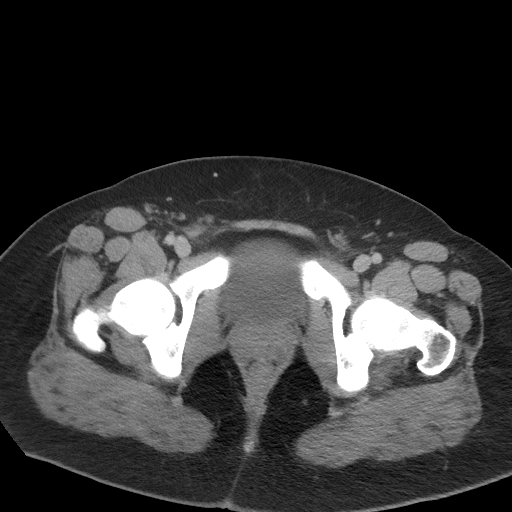
[im 22/95  soft-tissue]
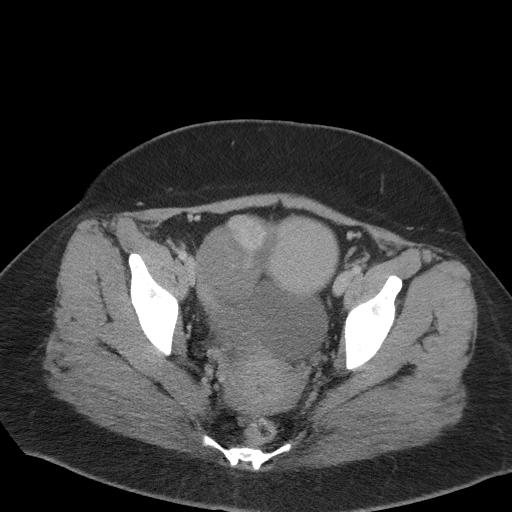
[im 30/95  soft-tissue]
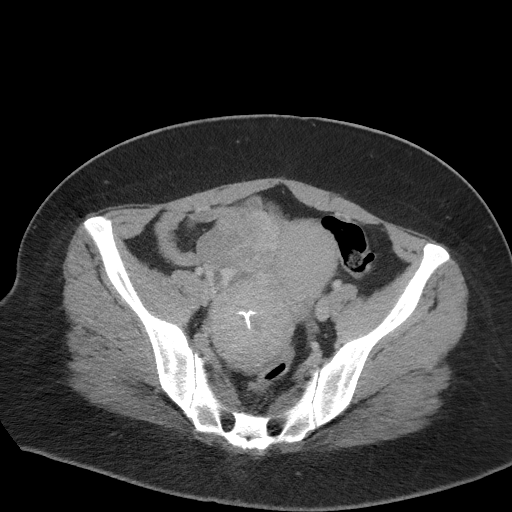
[im 39/95  soft-tissue]
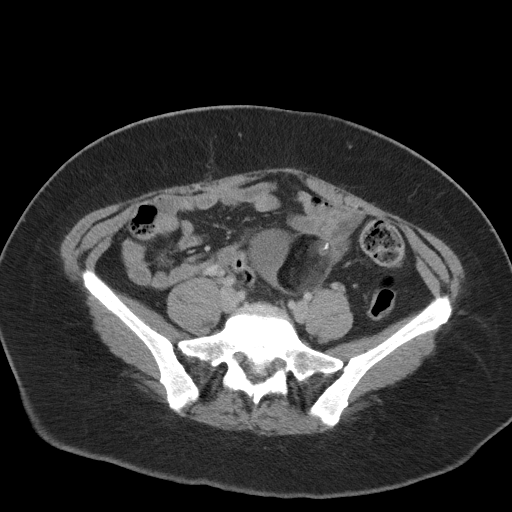
[im 48/95  soft-tissue]
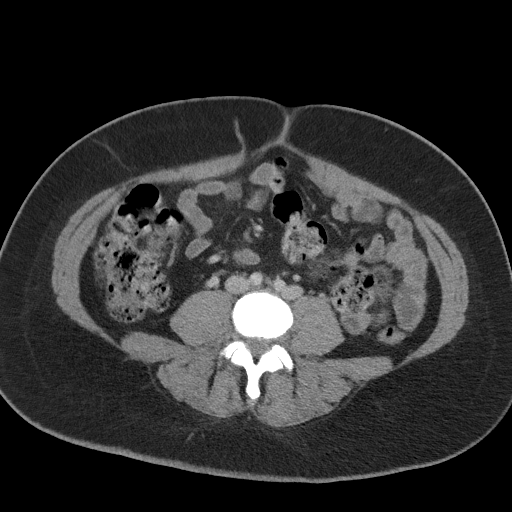
[im 56/95  soft-tissue]
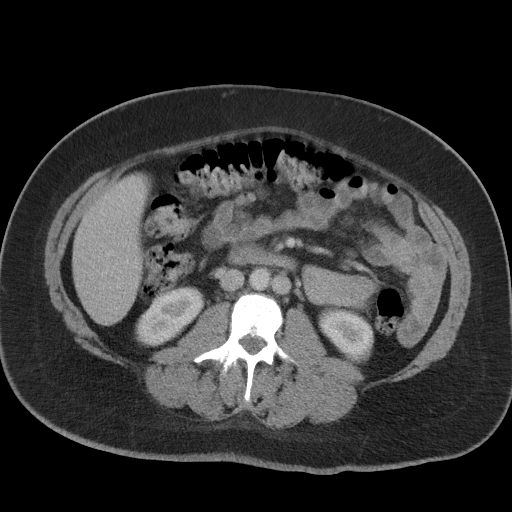
[im 65/95  soft-tissue]
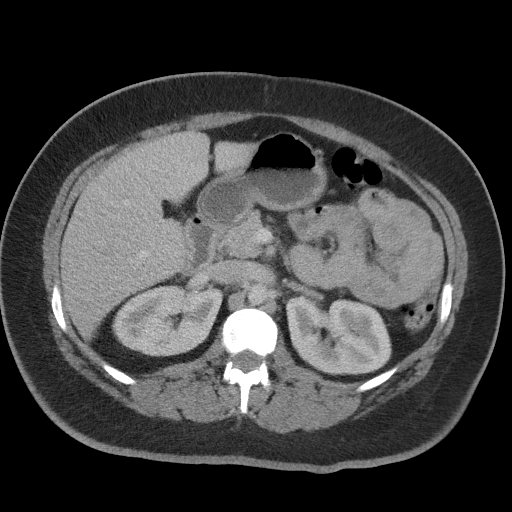
[im 73/95  soft-tissue]
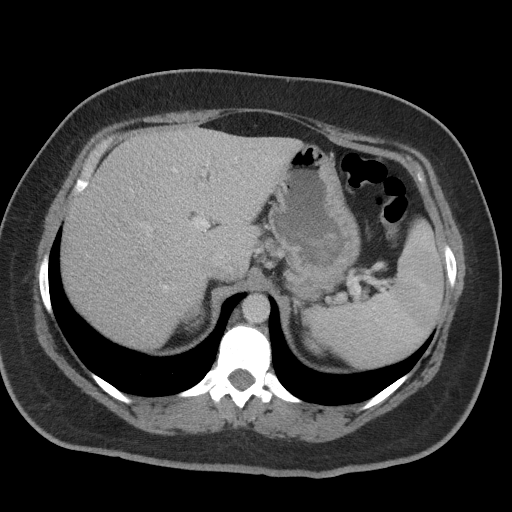
[im 73/95  bone]
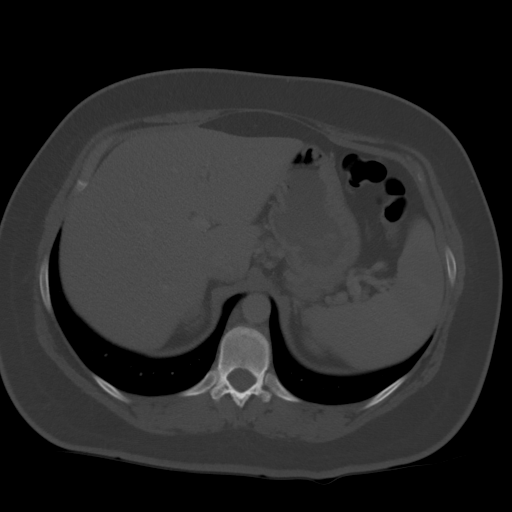
[im 82/95  soft-tissue]
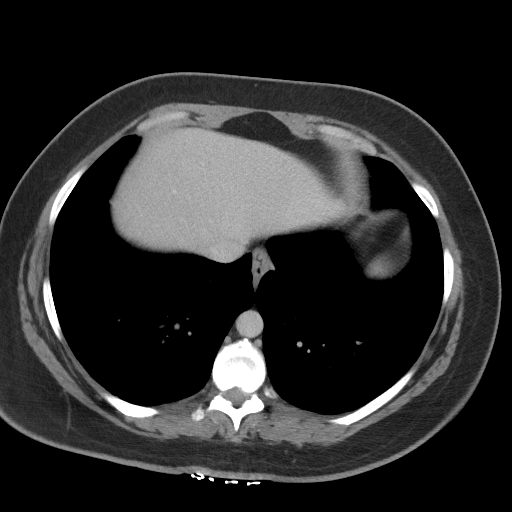
[im 90/95  soft-tissue]
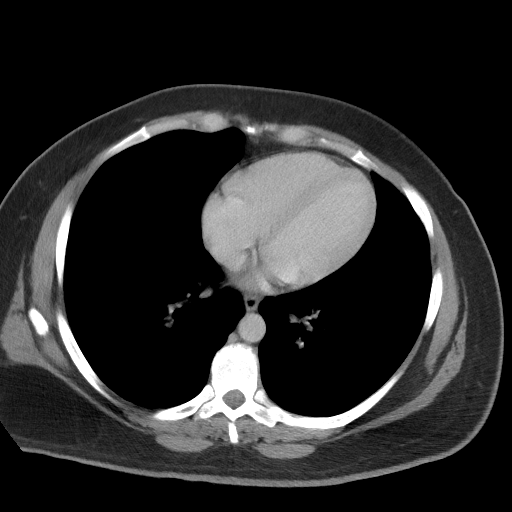

[Series 5: coronal st · coronal · 0.82mm/px · 3 of 89 slices shown]
[im 30/89  soft-tissue]
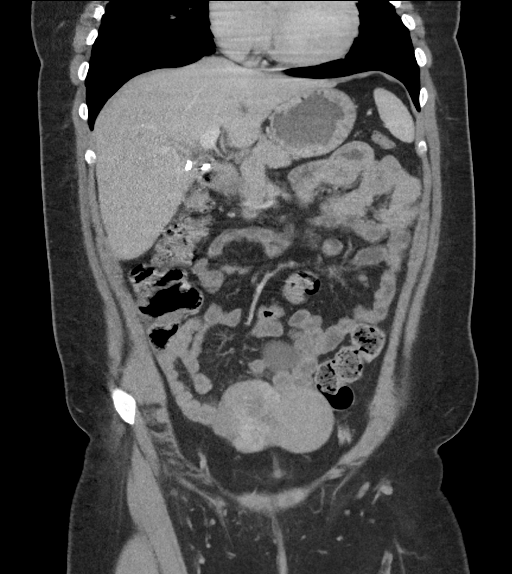
[im 40/89  soft-tissue]
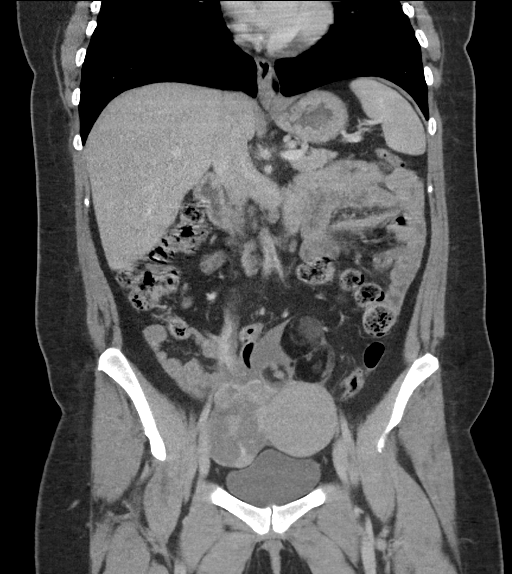
[im 49/89  soft-tissue]
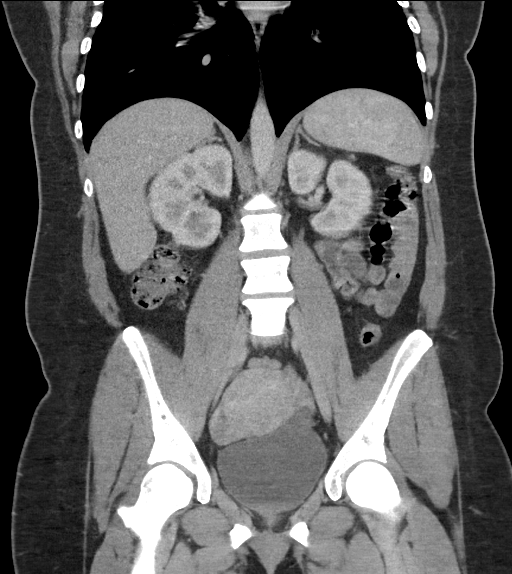

[14 of 46 positions shown; findings below may reference images not displayed]

FINDINGS: Lower chest: Minimal basilar atelectasis in the otherwise clear lung
bases. Normal heart size. No pericardial effusion.

Hepatobiliary: No worrisome focal liver lesions. Smooth liver
surface contour. Normal hepatic attenuation. Prior cholecystectomy.
No significant biliary dilatation or intraductal gallstones.

Pancreas: No pancreatic ductal dilatation or surrounding
inflammatory changes.

Spleen: Normal splenic size. Heterogeneous enhancement of the
splenic parenchyma, possibly related to contrast timing on this late
arterial phase exam.

Adrenals/Urinary Tract: Normal adrenal glands. Kidneys are normally
located with symmetric enhancement. No suspicious renal lesion,
urolithiasis or hydronephrosis. Urinary bladder is unremarkable for
the degree of distention.

Stomach/Bowel: Distal esophagus, stomach and duodenal sweep are
unremarkable. No small bowel wall thickening or dilatation. No
evidence of obstruction. A normal appendix is visualized. No colonic
dilatation or wall thickening.

Vascular/Lymphatic: Minimal atherosclerotic plaque in the abdominal
aorta and branch vessels. No pathologically enlarged nodes are seen
in the abdomen or pelvis.

Reproductive: Anteverted uterus with radiopaque IUD in position.

There are 2 macroscopic fat containing lesions in the region of the
left adnexa contiguous with the left gonadal vein with a separate
more intermediate attenuation cystic appearing structure as well
measuring up to 5.5 cm in size (2/61). No significant twisting of
the gonadal vein or surrounding inflammation is seen at the level of
this lesion.

There is however some a hazy stranding and fluid tracking along the
right gonadal veins with venous congestion noted, contiguous with a
heterogeneously attenuating bilobed masslike focus measuring 6.4 x
11.2 cm in size in the right adnexa though extends across midline
and anterior to the uterus. This likely corresponds to the cystic
finding on comparison ultrasound with solid components on that exam
demonstrating some internal color flow. No uncontained
intraperitoneal hemorrhage is seen.

Other: Trace low-attenuation free fluid is present. No free air. No
bowel containing hernia. Small ventral rectus diastasis.

Musculoskeletal: No acute osseous abnormality or suspicious osseous
lesion. Musculature is normal and symmetric.
IMPRESSION: Large bilobed lesion arising in the right adnexa measuring
approximately 6.4 x 11.2 cm in size though extending anterior to the
uterus and crossing midline. Heterogeneous attenuation within this
structure is seen centrally with a more low-attenuation portion
along the right lateral aspect and a larger more uniformly
hyperattenuating focus along the left margin. Appearance is
nonspecific though could could reflect enlarged, hemorrhagic cysts.
Some associated right gonadal venous dilatation and fluid tracking
along the right gonadal vein, could reflect a degree of venous
congestion versus sequela of a torsion-detorsion. Gynecologic
consultation is warranted.

Paired macroscopic fat containing lesions suggestive of ovarian
dermoids are seen abutting a more cystic appearing 5.5 cm focus
arising from what is likely the left ovary, contiguous with the left
gonadal vein. No abnormal venous congestion or twisting is seen.
Shadowing from these dermoids may have resulted in the
nonvisualization of the ovary on the comparison ultrasound.

Small volume of low-attenuation free fluid in the deep pelvis is
nonspecific and can be a physiologic finding.

Anteverted uterus with radiodense IUD in position towards the
uterine fundus.

Heterogeneous splenic enhancement, possibly related to contrast
timing. Correlate for left upper quadrant symptoms.

These results were called by telephone at the time of interpretation
on 11/21/2020 at [DATE] to provider NEPTALI HASHIMI , who verbally
acknowledged these results.

## 2022-04-15 MED ORDER — AMLODIPINE BESYLATE 5 MG PO TABS: 5.0000 mg | ORAL_TABLET | Freq: Every day | ORAL | 4 refills | Status: DC

## 2022-04-15 NOTE — Progress Notes (Signed)
See mychart message from 04/15/22. Per pt request, plan for repeat US with appt same day if possible.

## 2022-04-15 NOTE — Progress Notes (Signed)
   Subjective:    Patient ID: Marissa Lane, female    DOB: 09-13-1990, 31 y.o.   MRN: [id number]  HPI Pt arrives with concerns of high blood pressure. Pt states she was having some chest discomfort. Pt states blood pressure has been running between 150-170/90/110. Blood pressure is elevated despite taking it at work also had a recent doctor office visit plus also at home I did discuss with her proper cuff size how that makes a difference I do not recommend wrist blood pressure monitor is   Review of Systems     Objective:   Physical Exam  Lungs clear heart regular pulse normal BP slightly elevated      Assessment & Plan:  HTN Start amlodipine 5 mg daily Healthy diet regular activity Follow-up in approximately 4 to 6 weeks Follow-up sooner if any problems

## 2022-04-15 NOTE — Patient Instructions (Signed)

## 2022-04-15 NOTE — Telephone Encounter (Signed)
Spoke with patient and setup [place] appointment for 05/13/2022 at 1:30pm.  Also scheduled visit with Dr Ernestina Patches same day at 3:00pm.  Patient understood and confirmed.

## 2022-04-19 ENCOUNTER — Ambulatory Visit (HOSPITAL_COMMUNITY): Payer: Self-pay

## 2022-04-26 ENCOUNTER — Ambulatory Visit (HOSPITAL_COMMUNITY): Payer: Self-pay

## 2022-04-30 ENCOUNTER — Other Ambulatory Visit: Payer: Self-pay | Admitting: *Deleted

## 2022-04-30 NOTE — Progress Notes (Signed)
The proposed treatment discussed in conference is for discussion purpose only and is not a binding recommendation.  The patients have not been physically examined, or presented with their treatment options.  Therefore, final treatment plans cannot be decided.  

## 2022-05-06 ENCOUNTER — Telehealth: Payer: Self-pay | Admitting: Psychiatry

## 2022-05-06 ENCOUNTER — Telehealth: Payer: Self-pay

## 2022-05-06 DIAGNOSIS — C561 Malignant neoplasm of right ovary: Secondary | ICD-10-CM

## 2022-05-06 NOTE — Telephone Encounter (Signed)
Reviewed discussion from tumor board including consideration for nuclear medicine scan to evaluate for ectopic thyroid tissue given her mildly elevated thyroglobulin level.  Recommend the patient keep her pelvic ultrasound as scheduled as well.  Reviewed that if possible tumor tissue is too small, the nuclear medicine scan could be a false negative.  However if a positive finding, then we could address and possibly rule in or rule out her other ovary as a contributing factor.  All questions answered.  We will work to get scheduled.

## 2022-05-06 NOTE — Telephone Encounter (Signed)
I called to schedule the NM whole body scan ordered by Dr. Ernestina Patches. Teola Bradley, Radiology dept states she will call pt and get her scheduled.

## 2022-05-06 NOTE — Telephone Encounter (Signed)
Pt is scheduled for NM whole Body scan w/ Thyrogen on 05/13/22 @ 1:30

## 2022-05-10 ENCOUNTER — Encounter: Payer: Self-pay | Admitting: Psychiatry

## 2022-05-13 ENCOUNTER — Inpatient Hospital Stay: Payer: Self-pay | Admitting: Psychiatry

## 2022-05-13 ENCOUNTER — Ambulatory Visit (HOSPITAL_COMMUNITY)
Admission: RE | Admit: 2022-05-13 | Discharge: 2022-05-13 | Disposition: A | Discharge status: Home / Self Care | Payer: Self-pay | Source: Ambulatory Visit | Attending: Gynecologic Oncology | Admitting: Gynecologic Oncology

## 2022-05-13 DIAGNOSIS — N83209 Unspecified ovarian cyst, unspecified side: Secondary | ICD-10-CM | POA: Insufficient documentation

## 2022-05-13 DIAGNOSIS — C561 Malignant neoplasm of right ovary: Secondary | ICD-10-CM

## 2022-05-15 NOTE — Progress Notes (Signed)
This encounter was created in error - please disregard.

## 2022-05-17 ENCOUNTER — Telehealth: Payer: Self-pay

## 2022-05-17 NOTE — Telephone Encounter (Signed)
Patient called to see about speaking with Dr Ernestina Patches to go over [place] results that she received in [place].  I advised I would send a message to Dr Ernestina Patches to have her get in touch with her.

## 2022-05-17 NOTE — Written Directive (Cosign Needed)
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 Bernadene Bell   THYROGEN STIMULATION OR HORMONE WITHDRAW: Thyrogen Stimulation   DATE OF THYROIDECTOMY: 03/23/2021   SURGEON: Dr. Harlow Asa   TSH:  1.12 as of 05/13/2022 Lab Results  Component Value Date   TSH 2.120 09/08/2020   TSH 0.832 04/27/2019   TSH 0.532 01/03/2017     PRIOR I-131 THERAPY (Date and Dose):   ADDITIONAL PHYSICIAN COMMENTS/NOTES   AUTHORIZED USER SIGNATURE & TIME STAMP: R[place] Golden, MD   05/19/22    12:21 PM

## 2022-05-17 NOTE — Telephone Encounter (Signed)
Pt called back. Relayed below message from Dr Ernestina Patches to patient. Patient verbalized understanding and had no further questions at this time.

## 2022-05-20 ENCOUNTER — Encounter (HOSPITAL_COMMUNITY)
Admission: RE | Admit: 2022-05-20 | Discharge: 2022-05-20 | Disposition: A | Discharge status: Home / Self Care | Payer: Self-pay | Source: Ambulatory Visit | Attending: Psychiatry | Admitting: Psychiatry

## 2022-05-20 ENCOUNTER — Encounter (HOSPITAL_COMMUNITY): Admission: RE | Admit: 2022-05-20 | Payer: Self-pay | Source: Ambulatory Visit

## 2022-05-20 DIAGNOSIS — C73 Malignant neoplasm of thyroid gland: Secondary | ICD-10-CM | POA: Insufficient documentation

## 2022-05-20 DIAGNOSIS — C561 Malignant neoplasm of right ovary: Secondary | ICD-10-CM | POA: Insufficient documentation

## 2022-05-20 MED ORDER — THYROTROPIN ALFA 0.9 MG IM SOLR
INTRAMUSCULAR | Status: AC
  Administered 2022-05-20: 0.9 mg via INTRAMUSCULAR
  Filled 2022-05-20: qty 0.9

## 2022-05-20 MED ORDER — THYROTROPIN ALFA 0.9 MG IM SOLR: 0.9000 mg | INTRAMUSCULAR | Status: AC

## 2022-05-21 ENCOUNTER — Encounter (HOSPITAL_COMMUNITY): Payer: Self-pay

## 2022-05-21 ENCOUNTER — Encounter (HOSPITAL_COMMUNITY)
Admission: RE | Admit: 2022-05-21 | Discharge: 2022-05-21 | Disposition: A | Discharge status: Home / Self Care | Payer: Self-pay | Source: Ambulatory Visit | Attending: Psychiatry | Admitting: Psychiatry

## 2022-05-21 MED ORDER — THYROTROPIN ALFA 0.9 MG IM SOLR
0.9000 mg | INTRAMUSCULAR | Status: AC
  Administered 2022-05-21: 0.9 mg via INTRAMUSCULAR

## 2022-05-21 MED ORDER — THYROTROPIN ALFA 0.9 MG IM SOLR
INTRAMUSCULAR | Status: AC
  Filled 2022-05-21: qty 0.9

## 2022-05-22 ENCOUNTER — Encounter (HOSPITAL_COMMUNITY)
Admission: RE | Admit: 2022-05-22 | Discharge: 2022-05-22 | Disposition: A | Discharge status: Home / Self Care | Payer: Self-pay | Source: Ambulatory Visit | Attending: Psychiatry | Admitting: Psychiatry

## 2022-05-22 ENCOUNTER — Other Ambulatory Visit (HOSPITAL_COMMUNITY): Payer: Self-pay

## 2022-05-22 DIAGNOSIS — C561 Malignant neoplasm of right ovary: Secondary | ICD-10-CM

## 2022-05-22 DIAGNOSIS — C73 Malignant neoplasm of thyroid gland: Secondary | ICD-10-CM

## 2022-05-22 LAB — PREGNANCY, URINE: Preg Test, Ur: NEGATIVE

## 2022-05-22 MED ORDER — SODIUM IODIDE I 131 CAPSULE
3.8000 | Freq: Once | INTRAVENOUS | Status: AC | PRN
  Administered 2022-05-22: 3.8 via ORAL

## 2022-05-24 ENCOUNTER — Ambulatory Visit (HOSPITAL_COMMUNITY)
Admission: RE | Admit: 2022-05-24 | Discharge: 2022-05-24 | Disposition: A | Discharge status: Home / Self Care | Payer: Self-pay | Source: Ambulatory Visit | Attending: Psychiatry | Admitting: Psychiatry

## 2022-05-24 ENCOUNTER — Other Ambulatory Visit (HOSPITAL_COMMUNITY): Payer: Self-pay

## 2022-05-24 MED ORDER — SODIUM IODIDE I 131 CAPSULE
3.8000 | Freq: Once | INTRAVENOUS | Status: AC
  Administered 2022-05-23: 3.8 via ORAL

## 2022-05-27 ENCOUNTER — Inpatient Hospital Stay: Payer: Self-pay | Attending: Psychiatry | Admitting: Psychiatry

## 2022-05-27 ENCOUNTER — Other Ambulatory Visit (HOSPITAL_COMMUNITY): Payer: Self-pay

## 2022-05-27 DIAGNOSIS — N83202 Unspecified ovarian cyst, left side: Secondary | ICD-10-CM

## 2022-05-27 DIAGNOSIS — C561 Malignant neoplasm of right ovary: Secondary | ICD-10-CM | POA: Insufficient documentation

## 2022-05-27 NOTE — Progress Notes (Unsigned)
Gynecologic Oncology Telehealth Follow-Up Note  I connected with Marissa Lane on 05/27/22 at  3:45 PM EST by telephone and verified that I am speaking with the correct person using two identifiers.  I discussed the limitations, risks, security and privacy concerns of performing an evaluation and management service by telemedicine and the availability of in-person appointments. I also discussed with the patient that there may be a patient responsible charge related to this service. The patient expressed understanding and agreed to proceed.  Other persons participating in the visit and their role in the encounter: none.  Patient's location: [place] Provider's location: [facility]  Date of Service: 05/27/2022 Referring Provider: Janyth Pupa, DO  Assessment & Plan: Marissa Lane is a 31 y.o. woman with a history of malignant ovarian strumii, following up after repeat imaging after a previously identified 1.7 cm solid hypoechoic vascular lesion on pelvic ultrasound in September.    Malignant ovarian strumii: - Reviewed patient's recent imaging in detail. - Most recent pelvic ultrasound on 05/13/2022 demonstrated resolution of the previously identified 17 mm hypoechoic nodule within the left ovary.  It did note 2 small simple cysts, one measuring 15 mm and the other measuring 14 mm. - Nuclear medicine scan showed focal activity in the central pelvis favored to represent urine in the bladder.  Otherwise no evidence of activity within the ovary or elsewhere. - Discussed with patient that based on these findings, at this time I do not feel that there is an indication to remove her 1 remaining ovary.  - Recommended repeat ultrasound in 3 months here at the hospital for similar comparison to the scan on 05/13/2022.  If this is stable with no concerning findings, could likely discontinue screening ultrasounds of this ovary, and would not feel that this ovary has evidence of disease. - Discussed that we could  continue to follow along with her endocrinologist who is primarily managing the malignant ovarian strumii.  Based on my prior discussion with Dr. Buddy Duty, he felt that this can be a slow moving process, and would consider repeat imaging if further increases in thyroglobulin.  Will defer to him on timing of additional imaging if needed. - Ultrasound ordered for early February. Will discuss results with patient when available.   RTC pending repeat ultrasound results.  Bernadene Bell, MD Gynecologic Oncology   Medical Decision Making I personally spent  TOTAL 30 minutes face-to-face and non-face-to-face in the care of this patient, which includes all pre, intra, and post visit time on the date of service.  3 minutes spent reviewing records prior to the visit 20 Minutes in patient contact 7 minutes charting , conferring with consultants etc.   ----------------------- Reason for Visit: Follow-up  Treatment History: Oncology History  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[place])  01/02/2021 Surgery   Robotic-assisted laparoscopic right salpingo-oophorectomy, left ovarian cystectomy   01/02/2021 Initial Diagnosis   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[place])  FINAL MICROSCOPIC DIAGNOSIS:   A. 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 OVARIAN CYST, LEFT, #1, EXCISION:  -  Mature teratoma  -  See comment   C. OVARIAN CYST, LEFT, #2, EXCISION:  -  Mature teratoma  -  See comment    03/23/2021 Surgery    SURGICAL PATHOLOGY  CASE: [id number]  1. Total thyroidectomy  2. Excision of soft tissue tumor right posterior shoulder,  16 x 10 x 5 cm, subcutaneous   FINAL MICROSCOPIC DIAGNOSIS:   A. LIPOMA, RIGHT POSTERIOR SHOULDER, EXCISION:  -  Mature adipose tissue consistent with lipoma   B. THYROID, TOTAL, THYROIDECTOMY:  -   Benign thyroid  -  No carcinoma identified      Interval History: Since the patient's last visit with me on 04/08/2022, she has undergone a pelvic ultrasound and a nuclear medicine thyroid scan.  Patient reports that she is overall doing well with no new complaints.  She believes that her next endocrinology labs are due in 07/08/2022 but she does not think that she is getting her thyroglobulin checked at that time.  She reports that she will have insurance starting January 1.  Past Medical/Surgical History: Past Medical History:  Diagnosis Date   Acne    Anxiety    Cancer ([place])    thyroid   Chest pain    Chronic hypertension    Diabetes mellitus without complication (HCC)    Dyspnea    Dysthymic disorder    over dose age 52   GERD (gastroesophageal reflux disease)    Gestational diabetes    metformin   H/O urinary frequency 06/25/2013   Irregular intermenstrual bleeding 10/12/2013   Lipoma    right scapula   Obesity    Palpitations    PCOS (polycystic ovarian syndrome)    Postpartum depression    Tobacco abuse     Past Surgical History:  Procedure Laterality Date   CHOLECYSTECTOMY  03/06/2012   Procedure: LAPAROSCOPIC CHOLECYSTECTOMY;  Surgeon: Donato Heinz, MD;  Location: [facility];  Service: General;  Laterality: N/A;   ESOPHAGOGASTRODUODENOSCOPY N/A 09/09/2012   Procedure: ESOPHAGOGASTRODUODENOSCOPY (EGD);  Surgeon: Rogene Houston, MD;  Location: [facility];  Service: Endoscopy;  Laterality: N/A;  340   MASS EXCISION Right 03/23/2021   Procedure: EXCISION SOFT TISSUE MASS ON RIGHT SHOULDER;  Surgeon: Armandina Gemma, MD;  Location: [facility];  Service: General;  Laterality: Right;   MOUTH SURGERY     ROBOTIC ASSISTED LAPAROSCOPIC OVARIAN CYSTECTOMY Left 01/02/2021   Procedure: XI ROBOTIC ASSISTED LEFT LAPAROSCOPIC OVARIAN CYSTECTOMY;  Surgeon: Everitt Amber, MD;  Location: [facility];  Service: Gynecology;  Laterality: Left;   ROBOTIC ASSISTED SALPINGO OOPHERECTOMY Right 01/02/2021    Procedure: XI ROBOTIC ASSISTED RIGHT SALPINGO OOPHORECTOMY;  Surgeon: Everitt Amber, MD;  Location: [facility];  Service: Gynecology;  Laterality: Right;   SKIN GRAFT  2009   gum graft   THYROIDECTOMY N/A 03/23/2021   Procedure: TOTAL THYROIDECTOMY;  Surgeon: Armandina Gemma, MD;  Location: [facility];  Service: General;  Laterality: N/A;   TONSILLECTOMY      Family History  Adopted: Yes  Problem Relation Age of Onset   Congestive Heart Failure Mother    Irritable bowel syndrome Mother    Ulcers Mother    Anemia Mother    Drug abuse Mother    Hypertension Brother    Colon cancer Maternal Grandmother 44   Anxiety disorder Maternal Grandmother    Heart disease Maternal Grandfather 57       MI    Social History   Socioeconomic History   Marital status: Married    Spouse name: Nerine Pulse   Number of children: 3   Years of education: Not on file   Highest education level: Associate degree: occupational, Hotel manager, or vocational program  Occupational History   Occupation: LPN  Tobacco Use   Smoking status: Every Day    Packs/day: 1.00  Years: 10.00    Total pack years: 10.00    Types: Cigarettes   Smokeless tobacco: Never  Vaping Use   Vaping Use: Never used  Substance and Sexual Activity   Alcohol use: No   Drug use: No   Sexual activity: Yes    Birth control/protection: I.U.D.  Other Topics Concern   Not on file  Social History Narrative   Not on file   Social Determinants of Health   Financial Resource Strain: Low Risk  (03/28/2022)   Overall Financial Resource Strain (CARDIA)    Difficulty of Paying Living Expenses: Not very hard  Food Insecurity: No Food Insecurity (03/28/2022)   Hunger Vital Sign    Worried About Running Out of Food in the Last Year: Never true    Ran Out of Food in the Last Year: Never true  Transportation Needs: No Transportation Needs (08/21/2020)   PRAPARE - Hydrologist (Medical): No    Lack of Transportation  (Non-Medical): No  Physical Activity: Inactive (03/28/2022)   Exercise Vital Sign    Days of Exercise per Week: 0 days    Minutes of Exercise per Session: 0 min  Stress: No Stress Concern Present (03/28/2022)   [place]    Feeling of Stress : Only a little  Social Connections: Moderately Integrated (03/28/2022)   Social Connection and Isolation Panel [NHANES]    Frequency of Communication with Friends and Family: More than three times a week    Frequency of Social Gatherings with Friends and Family: Once a week    Attends Religious Services: 1 to 4 times per year    Active Member of [employer] or Organizations: No    Attends Archivist Meetings: Never    Marital Status: Married    Current Medications:  Current Outpatient Medications:    amLODipine (NORVASC) 5 MG tablet, Take 1 tablet (5 mg total) by mouth daily., Disp: 30 tablet, Rfl: 4   levonorgestrel (MIRENA) 20 MCG/24HR IUD, 1 each by Intrauterine route once., Disp: , Rfl:    levothyroxine (SYNTHROID) 200 MCG tablet, Take 200 mcg by mouth every morning. 175 alternating with 200, Disp: , Rfl:   Review of Symptoms: Pertinent positives as per HPI.  Physical Exam: Deferred given limitations of phone visit.  Laboratory & Radiologic Studies: NM Whole Body I131 Scan W/Thyrogen 05/24/2022  Narrative CLINICAL DATA:  Malignant struma ovarii I (papillary thyroid carcinoma follicular variant) post thyroidectomy. 4.0  EXAM: THYROGEN-STIMULATED I-131 WHOLE BODY SCAN  TECHNIQUE: The patient received 0.9 mg Thyrogen intramuscularly every 24 hours for two doses. On the third day the patient returned and received the radiopharmaceutical, per orally. On the fifth day, the patient returned and whole body planar images were obtained in the anterior and posterior projections.  RADIOPHARMACEUTICALS:  4.0 mCi I-131 sodium iodide orally  COMPARISON:  Post I 131 adjuvant  therapy 05/03/2021, MRI pelvis 12/07/2020, ultrasound pelvis 05/13/2022  FINDINGS: No radiotracer activity in thyroid bed  Physiologic activity noted in the GI track. Focal activity in the central pelvis is favored bladder activity.  No evidence of metastatic papillary thyroid carcinoma.  IMPRESSION: 1. Focal activity within the central pelvis is favor benign physiologic urine activity within the bladder. 2. No evidence of ovarian activity on planar imaging. Recent ultrasound demonstrated no LEFT ovarian mass. 3. No evidence of metastatic disease elsewhere. 4. No evidence of metastatic recurrence within the thyroid bed.   Electronically Signed By:  Suzy Bouchard M.D. On: 05/27/2022 08:24   US PELVIC COMPLETE WITH TRANSVAGINAL 05/13/2022  Narrative CLINICAL DATA:  Malignant ovarian stroma, recent ultrasound demonstrating a new finding of a 1.7 cm diameter solid hypoechoic vascular lesion. Prior RIGHT oophorectomy  EXAM: TRANSABDOMINAL AND TRANSVAGINAL ULTRASOUND OF PELVIS  TECHNIQUE: Both transabdominal and transvaginal ultrasound examinations of the pelvis were performed. Transabdominal technique was performed for global imaging of the pelvis including uterus, ovaries, adnexal regions, and pelvic cul-de-sac. It was necessary to proceed with endovaginal exam following the transabdominal exam to visualize the uterus, endometrium, and ovaries.  COMPARISON:  03/28/2022, 09/10/2021  FINDINGS: Uterus  Measurements: 8.7 x 5.2 x 6.2 cm = volume: 148 mL. Anteverted. Slightly heterogeneous myometrium. No focal mass. Small nabothian cysts at cervix.  Endometrium  Thickness: 5 mm. IUD identified at expected position at upper uterine endometrial canal. No endometrial fluid or mass  Right ovary  Surgically absent  Left ovary  Measurements: 3.6 x 1.9 x 1.7 cm = volume: 6.1 mL. Small cyst within LEFT ovary 12 x 15 x 13 mm. Additional small cyst within LEFT ovary 14  x 9 x 9 mm with a single questionably irregular wall. No additional masses. Small hypoechoic nodule within the LEFT ovary on the previous exam measuring 17 mm is no longer identified.  Other findings  No free pelvic fluid or adnexal masses.  IMPRESSION: IUD identified at expected position at upper uterine endometrial canal.  Two small cysts within LEFT ovary, the larger of which is simple measuring 15 mm greatest diameter and the smaller of which measures 14 mm greatest diameter and has a single questionably irregular wall; in light of the history of prior tumor, follow-up ultrasound recommended in 6-12 weeks to reassess.  Remainder of exam unremarkable.  Specifically, the 17 mm diameter hypoechoic nodule seen within LEFT ovary on the previous exam is not identified.   Electronically Signed By: Lavonia Dana M.D. On: 05/13/2022 17:05

## 2022-05-28 ENCOUNTER — Other Ambulatory Visit (HOSPITAL_COMMUNITY): Payer: Self-pay

## 2022-05-29 ENCOUNTER — Encounter: Payer: Self-pay | Admitting: Psychiatry

## 2022-05-29 ENCOUNTER — Other Ambulatory Visit (HOSPITAL_COMMUNITY): Payer: Self-pay

## 2022-05-29 NOTE — Addendum Note (Signed)
Addended by: Bernadene Bell on: 05/29/2022 01:11 PM   Modules accepted: Orders

## 2022-05-31 ENCOUNTER — Ambulatory Visit: Payer: Self-pay | Admitting: Family Medicine

## 2022-05-31 ENCOUNTER — Other Ambulatory Visit (HOSPITAL_COMMUNITY): Payer: Self-pay

## 2022-06-04 ENCOUNTER — Encounter (INDEPENDENT_AMBULATORY_CARE_PROVIDER_SITE_OTHER): Payer: Self-pay | Admitting: Gastroenterology

## 2022-06-26 ENCOUNTER — Emergency Department (HOSPITAL_COMMUNITY)
Admission: EM | Admit: 2022-06-26 | Discharge: 2022-06-26 | Disposition: A | Discharge status: Home / Self Care | Payer: Self-pay | Attending: Emergency Medicine | Admitting: Emergency Medicine

## 2022-06-26 ENCOUNTER — Encounter (HOSPITAL_COMMUNITY): Payer: Self-pay

## 2022-06-26 ENCOUNTER — Other Ambulatory Visit: Payer: Self-pay

## 2022-06-26 DIAGNOSIS — M542 Cervicalgia: Secondary | ICD-10-CM | POA: Insufficient documentation

## 2022-06-26 DIAGNOSIS — Z79899 Other long term (current) drug therapy: Secondary | ICD-10-CM | POA: Insufficient documentation

## 2022-06-26 DIAGNOSIS — R6884 Jaw pain: Secondary | ICD-10-CM | POA: Insufficient documentation

## 2022-06-26 DIAGNOSIS — I10 Essential (primary) hypertension: Secondary | ICD-10-CM | POA: Insufficient documentation

## 2022-06-26 DIAGNOSIS — E039 Hypothyroidism, unspecified: Secondary | ICD-10-CM | POA: Insufficient documentation

## 2022-06-26 MED ORDER — CEPHALEXIN 500 MG PO CAPS: 500.0000 mg | ORAL_CAPSULE | Freq: Three times a day (TID) | ORAL | 0 refills | Status: AC

## 2022-06-26 NOTE — ED Triage Notes (Signed)
Pt presents to ED with complaints of left sided jaw pain started Monday progressively worse since then, denies injury, pain radiates into left ear.

## 2022-06-26 NOTE — ED Provider Notes (Signed)
[facility] EMERGENCY DEPARTMENT Provider Note   CSN: [id number] Arrival date & time: 06/26/22  1701     History  Chief Complaint  Patient presents with   Jaw Pain    Fawna Cranmer is a 31 y.o. female with past medical history hypertension and hypothyroidism who presents to the ED complaining of left-sided jaw and neck pain for the last 2 days.  She states when she wakes up that she feels fine but towards the end of the day she has a pain under her tongue that goes to the left side of her neck that sometimes extends up to the left jaw in a linear fashion.  She denies sore throat, dental pain, jaw/neck injury or swelling, chest pain, nausea, vomiting, fever, chills, difficulty swallowing, swollen lymph nodes, headaches, dry eyes or mouth, worsening pain when swallowing, or other symptoms at this time she last saw a dentist in January of this year for a regular visit but has never had any dental problems.  Patient is a Marine scientist and at first was concerned for a clogged salivary gland so she has been sucking on hard candy to attempt to relieve symptoms but has noticed no change since doing so.  Of note, she started with amlodipine 5 mg for elevated blood pressure approximately 1 month ago but denies any difficulty swallowing, swelling to the throat tongue or lips, or any other reaction since starting this medicine.      Home Medications Prior to Admission medications   Medication Sig Start Date End Date Taking? Authorizing Provider  cephALEXin (KEFLEX) 500 MG capsule Take 1 capsule (500 mg total) by mouth 3 (three) times daily for 7 days. 06/26/22 07/03/22 Yes Oneal Biglow L, PA-C  amLODipine (NORVASC) 5 MG tablet Take 1 tablet (5 mg total) by mouth daily. 04/15/22   Kathyrn Drown, MD  levonorgestrel (MIRENA) 20 MCG/24HR IUD 1 each by Intrauterine route once.    [provider]  levothyroxine (SYNTHROID) 200 MCG tablet Take 200 mcg by mouth every morning. 175 alternating with 200  07/13/21   [provider]      Allergies    Bupropion, Cymbalta [duloxetine hcl], Oxycodone, Silicone, Vicodin [hydrocodone-acetaminophen], and Zofran [ondansetron hcl]    Review of Systems   Review of Systems  Constitutional:  Negative for appetite change, chills and fever.  HENT:  Negative for congestion, dental problem, drooling, ear pain, facial swelling, mouth sores, postnasal drip, rhinorrhea, sinus pressure, sinus pain, trouble swallowing and voice change.   Eyes:  Negative for pain and visual disturbance.  Respiratory:  Negative for apnea, cough, choking, chest tightness, shortness of breath and wheezing.   Cardiovascular:  Negative for chest pain and palpitations.  Gastrointestinal:  Negative for abdominal pain, constipation, diarrhea, nausea and vomiting.  Genitourinary:  Negative for dysuria and hematuria.  Musculoskeletal:  Negative for arthralgias and back pain.  Skin:  Negative for color change and rash.  Neurological:  Negative for dizziness, seizures, syncope, facial asymmetry, speech difficulty, light-headedness and headaches.  Hematological:  Negative for adenopathy.  All other systems reviewed and are negative.   Physical Exam Updated Vital Signs BP (!) 173/89   Pulse (!) 101   Temp 99 F (37.2 C) (Oral)   Resp 17   Ht '5\' 4"'$  (1.626 m)   Wt 103.4 kg   SpO2 100%   BMI 39.14 kg/m  Physical Exam Vitals and nursing note reviewed.  Constitutional:      General: She is not in acute distress.  Appearance: Normal appearance. She is not ill-appearing, toxic-appearing or diaphoretic.  HENT:     Head: Normocephalic and atraumatic.     Jaw: No trismus, tenderness, swelling, pain on movement or malocclusion.     Salivary Glands: Right salivary gland is not diffusely enlarged or tender. Left salivary gland is not diffusely enlarged or tender.     Nose: Nose normal.     Mouth/Throat:     Lips: No lesions.     Mouth: Mucous membranes are moist. No injury,  oral lesions or angioedema.     Dentition: Normal dentition. No dental tenderness, gingival swelling, dental caries, dental abscesses or gum lesions.     Tongue: No lesions. Tongue does not deviate from midline.     Palate: No lesions.     Pharynx: Oropharynx is clear. Uvula midline. No pharyngeal swelling, oropharyngeal exudate, posterior oropharyngeal erythema or uvula swelling.     Tonsils: No tonsillar exudate or tonsillar abscesses.     Comments: No palpable lymphadenopathy over anterior and posterior cervical chains, supraclavicular, preauricular, or postauricular chains Eyes:     Conjunctiva/sclera: Conjunctivae normal.  Cardiovascular:     Rate and Rhythm: Normal rate and regular rhythm.     Heart sounds: No murmur heard. Pulmonary:     Effort: Pulmonary effort is normal.     Breath sounds: Normal breath sounds.  Abdominal:     General: Abdomen is flat.     Palpations: Abdomen is soft.     Tenderness: There is no abdominal tenderness.  Musculoskeletal:        General: Normal range of motion.     Cervical back: Neck supple.     Right lower leg: No edema.     Left lower leg: No edema.  Skin:    General: Skin is warm and dry.     Capillary Refill: Capillary refill takes less than 2 seconds.  Neurological:     Mental Status: She is alert. Mental status is at baseline.  Psychiatric:        Behavior: Behavior normal.     ED Results / Procedures / Treatments   Labs (all labs ordered are listed, but only abnormal results are displayed) Labs Reviewed - No data to display  EKG None  Radiology No results found.  Procedures None  Medications Ordered in ED Medications - No data to display  ED Course/ Medical Decision Making/ A&P                           Medical Decision Making Risk Prescription drug management.   This patient presents with nonspecific left neck pain for the last 2 days that is intermittent and with vague associated symptoms including extension  of the pain to the left jaw and under the tongue but with no identifiable abscess, tenderness, erythema, swelling, or other abnormality on exam.  Dentition is appropriate, pharynx is unremarkable, airway is patent, and no identifiable lymphadenopathy.  Patient has had no injury to face, jaw, or neck, and has full range of motion of her neck.  She has had no associated infectious symptoms including fever, chills, nausea, vomiting, or localized signs of infection.  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 Attending MD also evaluated pt and agreed with treatment plan. Pt given strict return precautions for airway compromise, signs of infection, decreased  range of motion of the jaw or neck, or other concerns.         Final Clinical Impression(s) / ED Diagnoses Final diagnoses:  Neck pain    Rx / DC Orders ED Discharge Orders          Ordered    cephALEXin (KEFLEX) 500 MG capsule  3 times daily        06/26/22 1815              Turner Daniels 06/26/22 2114    Noemi Chapel, MD 06/27/22 1536

## 2022-06-26 NOTE — Discharge Instructions (Signed)
Thank you for letting us take care of you today.  As discussed, your exam was reassuring and we did not feel further testing was indicated at this time.  We have prescribed you Keflex if you develop a swollen lymph node, fever, chills, or other signs of infection. You should not start this medication unless indicated.  If your symptoms are not improving over the next week, please follow-up with your primary care provider for reevaluation.\  You may take over the counter Tylenol or ibuprofen for pain.   If you develop trouble breathing, chest pain, difficulty swallowing, or other concerns, please return to ED sooner for reevaluation.

## 2022-07-02 ENCOUNTER — Ambulatory Visit: Payer: Self-pay | Admitting: Family Medicine

## 2022-07-04 ENCOUNTER — Telehealth: Payer: Self-pay | Admitting: Family Medicine

## 2022-07-04 ENCOUNTER — Encounter: Payer: Self-pay | Admitting: Psychiatry

## 2022-07-04 NOTE — Telephone Encounter (Signed)
Patient is requesting a prescription be sent to [facility] on stand-by because both of her children were seen at Urgent Care with pink eye

## 2022-07-04 NOTE — Telephone Encounter (Signed)
Nurses-pinkeye is actually more of a viral illness that gradually gets better on its own-in other words antibiotics do not do anything for viruses-I would recommend very detailed handwashing whenever she is taking care of her children and avoid touching her face to help prevent getting this herself

## 2022-07-05 NOTE — Telephone Encounter (Signed)
Mychart message sent to patient.

## 2022-07-08 ENCOUNTER — Telehealth: Payer: Self-pay | Admitting: *Deleted

## 2022-07-08 NOTE — Telephone Encounter (Signed)
Pt read my chart message per EPIC

## 2022-07-08 NOTE — Telephone Encounter (Signed)
LMOM for the patient to call the office back; patient needs to be scheduled for an [place] scan in February

## 2022-07-09 NOTE — Telephone Encounter (Signed)
LMOM for the patient to call the office back; patient needs to be scheduled for an [place] scan in February

## 2022-07-30 ENCOUNTER — Encounter: Payer: Self-pay | Admitting: Family Medicine

## 2022-07-30 ENCOUNTER — Ambulatory Visit (INDEPENDENT_AMBULATORY_CARE_PROVIDER_SITE_OTHER): Payer: Commercial Managed Care - HMO | Admitting: Family Medicine

## 2022-07-30 VITALS — BP 127/84 | Wt 234.2 lb

## 2022-07-30 DIAGNOSIS — E785 Hyperlipidemia, unspecified: Secondary | ICD-10-CM | POA: Diagnosis not present

## 2022-07-30 DIAGNOSIS — I10 Essential (primary) hypertension: Secondary | ICD-10-CM

## 2022-07-30 NOTE — Progress Notes (Signed)
   Subjective:    Patient ID: Marissa Lane, female    DOB: 25-Dec-1990, 32 y.o.   MRN: [id number]  HPI Patient arrives for follow up on HTN. Pt states she feels her blood pressure is up today. No s/s of HTN.  Patient for blood pressure check up.  The patient does have hypertension.   Patient relates dietary measures try to minimize salt The importance of healthy diet and activity were discussed Patient relates compliance   Review of Systems     Objective:   Physical Exam General-in no acute distress Eyes-no discharge Lungs-respiratory rate normal, CTA CV-no murmurs,RRR Extremities skin warm dry no edema Neuro grossly normal Behavior normal, alert  Patient was encouraged to check into GLP-1 medicines and see if they are covered by her insurance      Assessment & Plan:   HTN- patient seen for follow-up regarding HTN.   Diet, medication compliance, appropriate labs and refills were completed.   Importance of keeping blood pressure under good control to lessen the risk of complications discussed Regular follow-up visits discussed  Morbid obesity patient recommended for portion control regular physical activity  Hyperlipidemia check lipid profile healthy diet  Follow-up within 4 to 6 months

## 2022-07-30 NOTE — Patient Instructions (Signed)
Wegovy Zepbound Saxenda

## 2022-08-30 ENCOUNTER — Ambulatory Visit (HOSPITAL_COMMUNITY)
Admission: RE | Admit: 2022-08-30 | Discharge: 2022-08-30 | Disposition: A | Discharge status: Home / Self Care | Payer: Commercial Managed Care - HMO | Source: Ambulatory Visit | Attending: Psychiatry | Admitting: Psychiatry

## 2022-08-30 DIAGNOSIS — C561 Malignant neoplasm of right ovary: Secondary | ICD-10-CM

## 2022-08-30 DIAGNOSIS — N83202 Unspecified ovarian cyst, left side: Secondary | ICD-10-CM | POA: Insufficient documentation

## 2022-09-02 ENCOUNTER — Other Ambulatory Visit: Payer: Self-pay | Admitting: Psychiatry

## 2022-09-02 ENCOUNTER — Telehealth: Payer: Self-pay

## 2022-09-02 DIAGNOSIS — N83202 Unspecified ovarian cyst, left side: Secondary | ICD-10-CM

## 2022-09-02 NOTE — Telephone Encounter (Signed)
Marissa Lane calling this morning wanting to get a call regarding ultrasound results.

## 2022-09-02 NOTE — Telephone Encounter (Signed)
LVM for patient to call the office regarding her ultrasound result and needing an MRI (per Dr.Newton)

## 2022-09-02 NOTE — Telephone Encounter (Signed)
I spoke to Ms. Krisher, she states she is fine with scheduling an MRI, however, she is claustrophobic and wonders if something light can be called in. She has had Xanax in the past.

## 2022-09-03 ENCOUNTER — Other Ambulatory Visit: Payer: Self-pay | Admitting: Gynecologic Oncology

## 2022-09-03 ENCOUNTER — Telehealth: Payer: Self-pay

## 2022-09-03 DIAGNOSIS — F40240 Claustrophobia: Secondary | ICD-10-CM

## 2022-09-03 MED ORDER — ALPRAZOLAM 0.5 MG PO TABS: 0.5000 mg | ORAL_TABLET | Freq: Once | ORAL | 0 refills | Status: AC

## 2022-09-03 NOTE — Progress Notes (Signed)
See CMA note. Given script for one time xanax prior to MRI

## 2022-09-03 NOTE — Telephone Encounter (Signed)
Error/disregard

## 2022-09-03 NOTE — Telephone Encounter (Signed)
MRI scheduled for 09/13/22 @ 12:00 at [facility]

## 2022-09-09 ENCOUNTER — Telehealth: Payer: Self-pay | Admitting: Family Medicine

## 2022-09-09 ENCOUNTER — Telehealth: Payer: Self-pay

## 2022-09-09 NOTE — Telephone Encounter (Signed)
It would be fine for her to utilize an additional amlodipine 5 mg If her blood pressure has been persistently we will need to adjust the dose of amlodipine if this was just a 1 day event I would recommend that she monitor it intermittently and if it continues to stay elevated I would want her to let us know so we could work her in sooner

## 2022-09-09 NOTE — Telephone Encounter (Signed)
Pt calling in and states her blood pressure was 160/100. Pt states she began to feel weird a little while ago; had some issues with shortness of breath but that has improved.Pt states she felt "full" like she couldn't get a deep breath-has improved. Pt states she did eat something salty for lunch about 12:30p and her blood pressure was elevated about an hour afterwards. Pt is wanting to know if she can take another Norvasc 5 mg. Please advise. Thank you.

## 2022-09-09 NOTE — Telephone Encounter (Signed)
error 

## 2022-09-09 NOTE — Telephone Encounter (Signed)
Pt responded back and verbalized understanding.

## 2022-09-09 NOTE — Telephone Encounter (Signed)
Mychart message sent to patient.

## 2022-09-13 ENCOUNTER — Ambulatory Visit (HOSPITAL_COMMUNITY)
Admission: RE | Admit: 2022-09-13 | Discharge: 2022-09-13 | Disposition: A | Discharge status: Home / Self Care | Payer: Commercial Managed Care - HMO | Source: Ambulatory Visit | Attending: Psychiatry | Admitting: Psychiatry

## 2022-09-13 DIAGNOSIS — N83202 Unspecified ovarian cyst, left side: Secondary | ICD-10-CM | POA: Insufficient documentation

## 2022-09-13 MED ORDER — GADOBUTROL 1 MMOL/ML IV SOLN
10.0000 mL | Freq: Once | INTRAVENOUS | Status: AC | PRN
  Administered 2022-09-13: 10 mL via INTRAVENOUS

## 2022-09-17 ENCOUNTER — Telehealth: Payer: Self-pay

## 2022-09-17 NOTE — Telephone Encounter (Signed)
Pt calling to get results from the MRI she had on 2/23. She is aware Dr. Ernestina Patches is in the OR today so it may be tomorrow or the next day before she gets a call back. She voices an understanding and is fine with waiting.

## 2022-09-19 ENCOUNTER — Telehealth: Payer: Self-pay | Admitting: Psychiatry

## 2022-09-19 NOTE — Telephone Encounter (Signed)
Patient called again, requesting results from her MRI, stating she sees the results on mychart and understands it says benign but wants to know what Dr Romona Curls recommendation is moving forward based on MRI results. She also states she would like to have results today, since tomorrow is her last day at her current job and she needs to know if she will need to plan for surgery. Patient advised that Dr Ernestina Patches would be notified and someone from our office will call her with results.

## 2022-09-19 NOTE — Telephone Encounter (Signed)
Per Dr Ernestina Patches, called patient to let her know of appointment times for 3/11 to see her and pre-op with Melissa. Patient aware of surgery date for 3/19. Post op also scheduled for 4/1. Patient verbalized understanding and had no other questions at this time.

## 2022-09-19 NOTE — Telephone Encounter (Signed)
Called pt regarding MRI results. 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 Discussed robotic assisted ovarian cystectomy and frozen pathology. If any concerns, would plan for USO. Pt expressed that if USO performed would desire concurrent hysterectomy. Briefly reviewed additional risks and surgical time as well as recovery restrictions. Pt understanding of these things and would want to pursue hyst if USO. Will tentatively get pt scheduled for 3/19 for surgery with plan for preop on 3/11. Pt will discuss with work and see if we need to adjust surgical timing as she is planning to change jobs soon.

## 2022-09-20 ENCOUNTER — Telehealth: Payer: Self-pay | Admitting: *Deleted

## 2022-09-20 NOTE — Telephone Encounter (Signed)
Patient called and left a message that she wants to move her surgery from 3/19 to the first week in April due to insurance. Spoke with the patient and explained the office will call her back next week. Patient not available on 4/9.

## 2022-09-24 ENCOUNTER — Other Ambulatory Visit: Payer: Self-pay | Admitting: Gynecologic Oncology

## 2022-09-24 DIAGNOSIS — N83202 Unspecified ovarian cyst, left side: Secondary | ICD-10-CM

## 2022-09-24 DIAGNOSIS — C561 Malignant neoplasm of right ovary: Secondary | ICD-10-CM

## 2022-09-25 ENCOUNTER — Other Ambulatory Visit (HOSPITAL_COMMUNITY): Payer: Commercial Managed Care - HMO

## 2022-09-30 ENCOUNTER — Inpatient Hospital Stay: Payer: Commercial Managed Care - HMO | Admitting: Gynecologic Oncology

## 2022-09-30 ENCOUNTER — Encounter: Payer: Commercial Managed Care - HMO | Admitting: Gynecologic Oncology

## 2022-09-30 ENCOUNTER — Inpatient Hospital Stay: Payer: Commercial Managed Care - HMO | Admitting: Psychiatry

## 2022-10-02 NOTE — Progress Notes (Signed)
This encounter was created in error - please disregard. (Appointment rescheduled)

## 2022-10-21 ENCOUNTER — Inpatient Hospital Stay: Payer: Commercial Managed Care - HMO | Attending: Psychiatry | Admitting: Psychiatry

## 2022-10-21 ENCOUNTER — Inpatient Hospital Stay (HOSPITAL_BASED_OUTPATIENT_CLINIC_OR_DEPARTMENT_OTHER): Payer: Commercial Managed Care - HMO | Admitting: Gynecologic Oncology

## 2022-10-21 ENCOUNTER — Encounter: Payer: Self-pay | Admitting: Gynecologic Oncology

## 2022-10-21 ENCOUNTER — Other Ambulatory Visit: Payer: Self-pay

## 2022-10-21 ENCOUNTER — Encounter: Payer: Commercial Managed Care - HMO | Admitting: Psychiatry

## 2022-10-21 VITALS — BP 138/94 | HR 103 | Temp 99.9°F | Resp 18 | Ht 64.0 in | Wt 223.0 lb

## 2022-10-21 DIAGNOSIS — Z8543 Personal history of malignant neoplasm of ovary: Secondary | ICD-10-CM | POA: Insufficient documentation

## 2022-10-21 DIAGNOSIS — F17210 Nicotine dependence, cigarettes, uncomplicated: Secondary | ICD-10-CM | POA: Diagnosis not present

## 2022-10-21 DIAGNOSIS — C561 Malignant neoplasm of right ovary: Secondary | ICD-10-CM

## 2022-10-21 DIAGNOSIS — N83202 Unspecified ovarian cyst, left side: Secondary | ICD-10-CM | POA: Insufficient documentation

## 2022-10-21 DIAGNOSIS — N83292 Other ovarian cyst, left side: Secondary | ICD-10-CM | POA: Insufficient documentation

## 2022-10-21 DIAGNOSIS — Z9079 Acquired absence of other genital organ(s): Secondary | ICD-10-CM | POA: Diagnosis not present

## 2022-10-21 DIAGNOSIS — N83209 Unspecified ovarian cyst, unspecified side: Secondary | ICD-10-CM

## 2022-10-21 DIAGNOSIS — Z90721 Acquired absence of ovaries, unilateral: Secondary | ICD-10-CM | POA: Insufficient documentation

## 2022-10-21 MED ORDER — IBUPROFEN 800 MG PO TABS: 800.0000 mg | ORAL_TABLET | Freq: Two times a day (BID) | ORAL | 0 refills | Status: DC | PRN

## 2022-10-21 MED ORDER — ESTRADIOL 0.1 MG/24HR TD PTWK: 0.1000 mg | MEDICATED_PATCH | TRANSDERMAL | 12 refills | Status: DC

## 2022-10-21 MED ORDER — SENNOSIDES-DOCUSATE SODIUM 8.6-50 MG PO TABS: 2.0000 | ORAL_TABLET | Freq: Every day | ORAL | 0 refills | Status: DC

## 2022-10-21 MED ORDER — TRAMADOL HCL 50 MG PO TABS: 50.0000 mg | ORAL_TABLET | Freq: Four times a day (QID) | ORAL | 0 refills | Status: DC | PRN

## 2022-10-21 NOTE — Patient Instructions (Signed)
Preparing for your Surgery  Plan for surgery on November 05, 2022 with Dr. Bernadene Bell at [place] will be scheduled for robotic assisted laparoscopic left ovarian cystectomy, possible unilateral salpingo-oophorectomy, possible robotic assisted laparoscopic total hysterectomy, and any other indicated procedures .   Pre-operative Testing -You will receive a phone call from presurgical testing at [facility] to arrange for a pre-operative appointment and lab work.  -Bring your insurance card, copy of an advanced directive if applicable, medication list  -At that visit, you will be asked to sign a consent for a possible blood transfusion in case a transfusion becomes necessary during surgery.  The need for a blood transfusion is rare but having consent is a necessary part of your care.     -You should not be taking blood thinners or aspirin at least ten days prior to surgery unless instructed by your surgeon.  -Do not take supplements such as fish oil (omega 3), red yeast rice, turmeric before your surgery. You want to avoid medications with aspirin in them including headache powders such as BC or Goody's), Excedrin migraine.  Day Before Surgery at [place] will be asked to take in a light diet the day before surgery. You will be advised you can have clear liquids up until 3 hours before your surgery.    Eat a light diet the day before surgery.  Examples including soups, broths, toast, yogurt, mashed potatoes.  AVOID GAS PRODUCING FOODS AND BEVERAGES. Things to avoid include carbonated beverages (fizzy beverages, sodas), raw fruits and raw vegetables (uncooked), or beans.   If your bowels are filled with gas, your surgeon will have difficulty visualizing your pelvic organs which increases your surgical risks.  Your role in recovery Your role is to become active as soon as directed by your doctor, while still giving yourself time to heal.  Rest when you feel tired. You  will be asked to do the following in order to speed your recovery:  - Cough and breathe deeply. This helps to clear and expand your lungs and can prevent pneumonia after surgery.  - [place]. Do mild physical activity. Walking or moving your legs help your circulation and body functions return to normal. Do not try to get up or walk alone the first time after surgery.   -If you develop swelling on one leg or the other, pain in the back of your leg, redness/warmth in one of your legs, please call the office or go to the Emergency Room to have a doppler to rule out a blood clot. For shortness of breath, chest pain-seek care in the Emergency Room as soon as possible. - Actively manage your pain. Managing your pain lets you move in comfort. We will ask you to rate your pain on a scale of zero to 10. It is your responsibility to tell your doctor or nurse where and how much you hurt so your pain can be treated.  Special Considerations -If you are diabetic, you may be placed on insulin after surgery to have closer control over your blood sugars to promote healing and recovery.  This does not mean that you will be discharged on insulin.  If applicable, your oral antidiabetics will be resumed when you are tolerating a solid diet.  -Your final pathology results from surgery should be available around one week after surgery and the results will be relayed to you when available.  -Dr. Lahoma Crocker is the surgeon that  assists your GYN Oncologist with surgery.  If you end up staying the night, the next day after your surgery you will either see Dr. Berline Lopes, Dr. Ernestina Patches, or Dr. Lahoma Crocker.  -FMLA forms can be faxed to [phone] and please allow 5-7 business days for completion.  Pain Management After Surgery -You have been prescribed your pain medication and bowel regimen medications before surgery so that you can have these available when you are discharged from the hospital.  The pain medication is for use ONLY AFTER surgery and a new prescription will not be given.   -Make sure that you have Tylenol and Ibuprofen IF YOU ARE ABLE TO TAKE THESE MEDICATIONS at home to use on a regular basis after surgery for pain control. We recommend alternating the medications every hour to six hours since they work differently and are processed in the body differently for pain relief.  -Review the attached handout on narcotic use and their risks and side effects.   Bowel Regimen -You have been prescribed Sennakot-S to take nightly to prevent constipation especially if you are taking the narcotic pain medication intermittently.  It is important to prevent constipation and drink adequate amounts of liquids. You can stop taking this medication when you are not taking pain medication and you are back on your normal bowel routine.  Risks of Surgery Risks of surgery are low but include bleeding, infection, damage to surrounding structures, re-operation, blood clots, and very rarely death.   Blood Transfusion Information (For the consent to be signed before surgery)  We will be checking your blood type before surgery so in case of emergencies, we will know what type of blood you would need.                                            WHAT IS A BLOOD TRANSFUSION?  A transfusion is the replacement of blood or some of its parts. Blood is made up of multiple cells which provide different functions. Red blood cells carry oxygen and are used for blood loss replacement. White blood cells fight against infection. Platelets control bleeding. Plasma helps clot blood. Other blood products are available for specialized needs, such as hemophilia or other clotting disorders. BEFORE THE TRANSFUSION  Who gives blood for transfusions?  You may be able to donate blood to be used at a later date on yourself (autologous donation). Relatives can be asked to donate blood. This is generally not any safer  than if you have received blood from a stranger. The same precautions are taken to ensure safety when a relative's blood is donated. Healthy volunteers who are fully evaluated to make sure their blood is safe. This is blood bank blood. Transfusion therapy is the safest it has ever been in the practice of medicine. Before blood is taken from a donor, a complete history is taken to make sure that person has no history of diseases nor engages in risky social behavior (examples are intravenous drug use or sexual activity with multiple partners). The donor's travel history is screened to minimize risk of transmitting infections, such as malaria. The donated blood is tested for signs of infectious diseases, such as HIV and hepatitis. The blood is then tested to be sure it is compatible with you in order to minimize the chance of a transfusion reaction. If you or a relative donates blood, this is often  done in anticipation of surgery and is not appropriate for emergency situations. It takes many days to process the donated blood. RISKS AND COMPLICATIONS Although transfusion therapy is very safe and saves many lives, the main dangers of transfusion include:  Getting an infectious disease. Developing a transfusion reaction. This is an allergic reaction to something in the blood you were given. Every precaution is taken to prevent this. The decision to have a blood transfusion has been considered carefully by your caregiver before blood is given. Blood is not given unless the benefits outweigh the risks.  AFTER SURGERY INSTRUCTIONS  Return to work: 4-6 weeks if applicable  Activity: 1. Be up and out of the bed during the day.  Take a nap if needed.  You may walk up steps but be careful and use the hand rail.  Stair climbing will tire you more than you think, you may need to stop part way and rest.   2. No lifting or straining for 6 weeks over 10 pounds. No pushing, pulling, straining for 6 weeks.  3. No  driving for around 1 week(s).  Do not drive if you are taking narcotic pain medicine and make sure that your reaction time has returned.   4. You can shower as soon as the next day after surgery. Shower daily.  Use your regular soap and water (not directly on the incision) and pat your incision(s) dry afterwards; don't rub.  No tub baths or submerging your body in water until cleared by your surgeon. If you have the soap that was given to you by pre-surgical testing that was used before surgery, you do not need to use it afterwards because this can irritate your incisions.   5. No sexual activity and nothing in the vagina for 6 weeks, 12 weeks (if you have a hysterectomy).  6. You may experience a small amount of clear drainage from your incisions, which is normal.  If the drainage persists, increases, or changes color please call the office.  7. Do not use creams, lotions, or ointments such as neosporin on your incisions after surgery until advised by your surgeon because they can cause removal of the dermabond glue on your incisions.    8. You may experience vaginal spotting after surgery or around the 6-8 week mark from surgery when the stitches at the top of the vagina begin to dissolve (if you have a hysterectomy).  The spotting is normal but if you experience heavy bleeding, call our office.  9. Take Tylenol or ibuprofen first for pain if you are able to take these medications and only use narcotic pain medication for severe pain not relieved by the Tylenol or Ibuprofen.  Monitor your Tylenol intake to a max of 4,000 mg in a 24 hour period. You can alternate these medications after surgery.  Diet: 1. Low sodium Heart Healthy Diet is recommended but you are cleared to resume your normal (before surgery) diet after your procedure.  2. It is safe to use a laxative, such as Miralax or Colace, if you have difficulty moving your bowels. You have been prescribed Sennakot-S to take at bedtime every  evening after surgery to keep bowel movements regular and to prevent constipation.    Wound Care: 1. Keep clean and dry.  Shower daily.  Reasons to call the Doctor: Fever - Oral temperature greater than 100.4 degrees Fahrenheit Foul-smelling vaginal discharge Difficulty urinating Nausea and vomiting Increased pain at the site of the incision that is unrelieved with  pain medicine. Difficulty breathing with or without chest pain New calf pain especially if only on one side Sudden, continuing increased vaginal bleeding with or without clots.   Contacts: For questions or concerns you should contact:  Dr. Bernadene Bell at [place], NP at [phone]  After Hours: call [phone] and have the GYN Oncologist paged/contacted (after 5 pm or on the weekends). You will speak with an after hours RN and let he or she know you have had surgery.  Messages sent via mychart are for non-urgent matters and are not responded to after hours so for urgent needs, please call the after hours number.

## 2022-10-21 NOTE — H&P (View-Only) (Signed)
Gynecologic Oncology Return Clinic Visit  Date of Service: 10/21/2022 Referring Provider: Luking, Scott A, MD [address] [place],  [place] [address]***  Assessment & Plan: Marissa Lane is a 31 y.o. woman with Stage *** who presents for ***.  ***Patient was consented for: Robotic assisted left ovarian cystectomy, possible left salpingo-oophorectomy, hysterectomy on 11/05/22.  The risks of surgery were discussed in detail and she understands these to including but not limited to bleeding requiring a blood transfusion, infection, {Op Risks:28210}.  If the patient experiences any of these events, she understands that her hospitalization or recovery may be prolonged and that she may need to take additional medications for a prolonged period. The patient will receive DVT and antibiotic prophylaxis as indicated. She voiced a clear understanding. She had the opportunity to ask questions and informed consent was obtained today. She wishes to proceed.  She will proceed to the lab today for CBC, BMP, T&S, ***.  She does ***not require preoperative clearance. ***Her METs are >4.  All preoperative instructions were reviewed. Postoperative expectations were also reviewed. Written handouts were provided to the patient.  Climara patch 0.1   ***VTE Prophylaxis: - Khorana score = ***  ***Preventative Screening: {Preventative screening:28493}  RTC ***.  Shirleen Mcfaul, MD Gynecologic Oncology   Medical Decision Making I personally spent  TOTAL *** minutes face-to-face and non-face-to-face in the care of this patient, which includes all pre, intra, and post visit time on the date of service.  *** minutes spent reviewing records prior to the visit *** Minutes in patient contact      *** minutes in other billable services *** minutes charting , conferring with consultants etc.   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 OVARIAN CYST, LEFT, #1, EXCISION:  -  Mature teratoma  -  See comment   C. OVARIAN CYST, LEFT, #2, EXCISION:  -  Mature teratoma  -  See comment    03/23/2021 Surgery    SURGICAL PATHOLOGY  CASE: [id number]  1. Total thyroidectomy  2. Excision of soft tissue tumor right posterior shoulder, 16 x 10 x 5 cm, subcutaneous   FINAL MICROSCOPIC DIAGNOSIS:   A. LIPOMA, RIGHT POSTERIOR SHOULDER, EXCISION:  -  Mature adipose tissue consistent with lipoma   B. THYROID, TOTAL, THYROIDECTOMY:  -  Benign thyroid  -  No carcinoma identified      Interval History: *** Job with lifting    Past Medical/Surgical History: Past Medical History:  Diagnosis Date   Acne    Anxiety    Cancer (HCC)    thyroid   Chest pain    Chronic hypertension    Diabetes mellitus without complication (HCC)    Dyspnea    Dysthymic disorder    over dose age 16   GERD (gastroesophageal reflux disease)    Gestational diabetes    metformin   H/O urinary frequency 06/25/2013   Irregular intermenstrual bleeding 10/12/2013   Lipoma    right scapula   Obesity    Palpitations    PCOS (polycystic ovarian syndrome)      Postpartum depression    Tobacco abuse     Past Surgical History:  Procedure Laterality Date   CHOLECYSTECTOMY  03/06/2012   Procedure: LAPAROSCOPIC CHOLECYSTECTOMY;  Surgeon: Brent C Ziegler, MD;  Location: [facility];  Service: General;  Laterality: N/A;   ESOPHAGOGASTRODUODENOSCOPY N/A 09/09/2012   Procedure: ESOPHAGOGASTRODUODENOSCOPY (EGD);  Surgeon:  Najeeb U Rehman, MD;  Location: [facility];  Service: Endoscopy;  Laterality: N/A;  340   MASS EXCISION Right 03/23/2021   Procedure: EXCISION SOFT TISSUE MASS ON RIGHT SHOULDER;  Surgeon: Gerkin, Todd, MD;  Location: [facility];  Service: General;  Laterality: Right;   MOUTH SURGERY     ROBOTIC ASSISTED LAPAROSCOPIC OVARIAN CYSTECTOMY Left 01/02/2021   Procedure: XI ROBOTIC ASSISTED LEFT LAPAROSCOPIC OVARIAN CYSTECTOMY;  Surgeon: Rossi, Emma, MD;  Location: [facility];  Service: Gynecology;  Laterality: Left;   ROBOTIC ASSISTED SALPINGO OOPHERECTOMY Right 01/02/2021   Procedure: XI ROBOTIC ASSISTED RIGHT SALPINGO OOPHORECTOMY;  Surgeon: Rossi, Emma, MD;  Location: [facility];  Service: Gynecology;  Laterality: Right;   SKIN GRAFT  2009   gum graft   THYROIDECTOMY N/A 03/23/2021   Procedure: TOTAL THYROIDECTOMY;  Surgeon: Gerkin, Todd, MD;  Location: [facility];  Service: General;  Laterality: N/A;   TONSILLECTOMY      Family History  Adopted: Yes  Problem Relation Age of Onset   Congestive Heart Failure Mother    Irritable bowel syndrome Mother    Ulcers Mother    Anemia Mother    Drug abuse Mother    Hypertension Brother    Colon cancer Maternal Grandmother 40   Anxiety disorder Maternal Grandmother    Heart disease Maternal Grandfather 40       MI    Social History   Socioeconomic History   Marital status: Married    Spouse name: Jeffery Jock   Number of children: 3   Years of education: Not on file   Highest education level: Associate degree: occupational, technical, or vocational program  Occupational History   Occupation: LPN  Tobacco Use   Smoking status: Every Day    Packs/day: 1.00    Years: 10.00    Additional pack years: 0.00    Total pack years: 10.00    Types: Cigarettes   Smokeless tobacco: Never  Vaping Use   Vaping Use: Never used  Substance and Sexual Activity   Alcohol use: No   Drug use: No   Sexual activity: Yes    Birth control/protection: I.U.D.  Other  Topics Concern   Not on file  Social History Narrative   Not on file   Social Determinants of Health   Financial Resource Strain: Low Risk  (03/28/2022)   Overall Financial Resource Strain (CARDIA)    Difficulty of Paying Living Expenses: Not very hard  Food Insecurity: No Food Insecurity (03/28/2022)   Hunger Vital Sign    Worried About Running Out of Food in the Last Year: Never true    Ran Out of Food in the Last Year: Never true  Transportation Needs: No Transportation Needs (08/21/2020)   PRAPARE - Transportation    Lack of Transportation (Medical): No    Lack of Transportation (Non-Medical): No  Physical Activity: Inactive (03/28/2022)   Exercise Vital Sign    Days of Exercise per Week: 0 days    Minutes of Exercise per Session: 0 min  Stress: No Stress Concern Present (03/28/2022)   [employer] - Occupational Stress Questionnaire    Feeling of Stress :   Only a little  Social Connections: Moderately Integrated (03/28/2022)   Social Connection and Isolation Panel [NHANES]    Frequency of Communication with Friends and Family: More than three times a week    Frequency of Social Gatherings with Friends and Family: Once a week    Attends Religious Services: 1 to 4 times per year    Active Member of Clubs or Organizations: No    Attends Club or Organization Meetings: Never    Marital Status: Married    Current Medications:  Current Outpatient Medications:    estradiol (CLIMARA) 0.1 mg/24hr patch, Place 1 patch (0.1 mg total) onto the skin once a week., Disp: 4 patch, Rfl: 12   amLODipine (NORVASC) 5 MG tablet, Take 1 tablet (5 mg total) by mouth daily., Disp: 30 tablet, Rfl: 4   levonorgestrel (MIRENA) 20 MCG/24HR IUD, 1 each by Intrauterine route once., Disp: , Rfl:    levothyroxine (SYNTHROID) 200 MCG tablet, Take 200 mcg by mouth every morning. 175 alternating with 200, Disp: , Rfl:   Review of Symptoms: Complete 10-system review is negative except as  above in Interval History.  Physical Exam: BP (!) 138/94 (BP Location: Left Arm, Patient Position: Sitting)   Pulse (!) 103   Temp 99.9 F (37.7 C) (Oral)   Resp 18   Ht 5' 4" (1.626 m)   Wt 223 lb (101.2 kg)   SpO2 98%   BMI 38.28 kg/m  General: Alert, oriented, no acute distress. HEENT: Normocephalic, atraumatic. Neck symmetric without masses. Sclera anicteric. Chest: Normal work of breathing. Clear to auscultation bilaterally.   Cardiovascular: Regular rate and rhythm, no murmurs. Abdomen: Soft, nontender.  Normoactive bowel sounds.  Extremities: Grossly normal range of motion.  Warm, well perfused.  No edema bilaterally. Skin: No rashes or lesions noted.  Laboratory & Radiologic Studies: MR Pelvis W Wo Contrast 09/13/2022  Narrative CLINICAL DATA:  Malignant struma ovarii. Previous right salpingo-oophorectomy. Left ovarian cystic lesion on recent ultrasound.  EXAM: MRI PELVIS WITHOUT AND WITH CONTRAST  TECHNIQUE: Multiplanar multisequence MR imaging of the pelvis was performed both before and after administration of intravenous contrast.  CONTRAST:  10mL GADAVIST GADOBUTROL 1 MMOL/ML IV SOLN  COMPARISON:  Ultrasound on 08/30/2022, and MRI on 12/07/2020  FINDINGS: Lower Urinary Tract: No urinary bladder or urethral abnormality identified.  Bowel: Unremarkable pelvic bowel loops.  Vascular/Lymphatic: Unremarkable. No pathologically enlarged pelvic lymph nodes identified.  Reproductive:  -- Uterus: Measures 9.7 x 5.8 by 5.8 cm (volume = 170 cm^3). No fibroids or other masses identified. IUD seen in appropriate location in the endometrial cavity. Cervix and vagina are unremarkable.  -- Right ovary: Surgically absent. No right adnexal mass identified.  -- Left ovary: Previously seen left ovarian teratoma on MRI of 2022 is no longer visualized. A simple left ovarian cyst is now seen which measures 6.0 x 4.7 by 4.1 cm. No complex features are seen.  Other:  No peritoneal thickening or abnormal free fluid.  Musculoskeletal:  Unremarkable.  IMPRESSION: 6 cm simple left ovarian cyst, with benign characteristics. This may be physiologic in a premenopausal female. Follow-up by US is recommended in 3 months. Note: This recommendation does not apply to premenarchal patients and to those with increased risk (genetic, family history, elevated tumor markers or other high-risk factors) of ovarian cancer. Reference: JACR 2020 Feb; 17(2):248-254  IUD in appropriate location in the endometrial cavity.   Electronically Signed By: John A Stahl M.D. On: 09/13/2022 17:25  

## 2022-10-21 NOTE — Progress Notes (Signed)
Gynecologic Oncology Return Clinic Visit  Date of Service: 10/21/2022 Referring Provider: Babs Sciara, MD [id number] [place] [employer],  [place] [address]***  Assessment & Plan: Marissa Lane is a 32 y.o. woman with Stage *** who presents for ***.  ***Patient was consented for: Robotic assisted left ovarian cystectomy, possible left salpingo-oophorectomy, hysterectomy on 11/05/22.  The risks of surgery were discussed in detail and she understands these to including but not limited to bleeding requiring a blood transfusion, infection, {Op Risks:28210}.  If the patient experiences any of these events, she understands that her hospitalization or recovery may be prolonged and that she may need to take additional medications for a prolonged period. The patient will receive DVT and antibiotic prophylaxis as indicated. She voiced a clear understanding. She had the opportunity to ask questions and informed consent was obtained today. She wishes to proceed.  She will proceed to the lab today for CBC, BMP, T&S, ***.  She does ***not require preoperative clearance. ***Her METs are >4.  All preoperative instructions were reviewed. Postoperative expectations were also reviewed. Written handouts were provided to the patient.  Climara patch 0.1   ***VTE Prophylaxis: - K[place] score = ***  ***Preventative Screening: {Preventative screening:28493}  RTC ***.  Clide Cliff, MD Gynecologic Oncology   Medical Decision Making I personally spent  TOTAL *** minutes face-to-face and non-face-to-face in the care of this patient, which includes all pre, intra, and post visit time on the date of service.  *** minutes spent reviewing records prior to the visit *** Minutes in patient contact      *** minutes in other billable services *** minutes charting , conferring with consultants etc.   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 OVARIAN CYST, LEFT, #1, EXCISION:  -  Mature teratoma  -  See comment   C. OVARIAN CYST, LEFT, #2, EXCISION:  -  Mature teratoma  -  See comment    03/23/2021 Surgery    SURGICAL PATHOLOGY  CASE: [id number]  1. Total thyroidectomy  2. Excision of soft tissue tumor right posterior shoulder, 16 x 10 x 5 cm, subcutaneous   FINAL MICROSCOPIC DIAGNOSIS:   A. LIPOMA, RIGHT POSTERIOR SHOULDER, EXCISION:  -  Mature adipose tissue consistent with lipoma   B. THYROID, TOTAL, THYROIDECTOMY:  -  Benign thyroid  -  No carcinoma identified      Interval History: *** Job with lifting    Past Medical/Surgical History: Past Medical History:  Diagnosis Date   Acne    Anxiety    Cancer (HCC)    thyroid   Chest pain    Chronic hypertension    Diabetes mellitus without complication (HCC)    Dyspnea    Dysthymic disorder    over dose age 56   GERD (gastroesophageal reflux disease)    Gestational diabetes    metformin   H/O urinary frequency 06/25/2013   Irregular intermenstrual bleeding 10/12/2013   Lipoma    right scapula   Obesity    Palpitations    PCOS (polycystic ovarian syndrome)  Postpartum depression    Tobacco abuse     Past Surgical History:  Procedure Laterality Date   CHOLECYSTECTOMY  03/06/2012   Procedure: LAPAROSCOPIC CHOLECYSTECTOMY;  Surgeon: Fabio Bering, MD;  Location: [facility];  Service: General;  Laterality: N/A;   ESOPHAGOGASTRODUODENOSCOPY N/A 09/09/2012   Procedure: ESOPHAGOGASTRODUODENOSCOPY (EGD);  Surgeon:  Malissa Hippo, MD;  Location: [facility];  Service: Endoscopy;  Laterality: N/A;  340   MASS EXCISION Right 03/23/2021   Procedure: EXCISION SOFT TISSUE MASS ON RIGHT SHOULDER;  Surgeon: Darnell Level, MD;  Location: [facility];  Service: General;  Laterality: Right;   MOUTH SURGERY     ROBOTIC ASSISTED LAPAROSCOPIC OVARIAN CYSTECTOMY Left 01/02/2021   Procedure: XI ROBOTIC ASSISTED LEFT LAPAROSCOPIC OVARIAN CYSTECTOMY;  Surgeon: Adolphus Birchwood, MD;  Location: [facility];  Service: Gynecology;  Laterality: Left;   ROBOTIC ASSISTED SALPINGO OOPHERECTOMY Right 01/02/2021   Procedure: XI ROBOTIC ASSISTED RIGHT SALPINGO OOPHORECTOMY;  Surgeon: Adolphus Birchwood, MD;  Location: [facility];  Service: Gynecology;  Laterality: Right;   SKIN GRAFT  2009   gum graft   THYROIDECTOMY N/A 03/23/2021   Procedure: TOTAL THYROIDECTOMY;  Surgeon: Darnell Level, MD;  Location: [facility];  Service: General;  Laterality: N/A;   TONSILLECTOMY      Family History  Adopted: Yes  Problem Relation Age of Onset   Congestive Heart Failure Mother    Irritable bowel syndrome Mother    Ulcers Mother    Anemia Mother    Drug abuse Mother    Hypertension Brother    Colon cancer Maternal Grandmother 22   Anxiety disorder Maternal Grandmother    Heart disease Maternal Grandfather 54       MI    Social History   Socioeconomic History   Marital status: Married    Spouse name: Malgorzata Friar   Number of children: 3   Years of education: Not on file   Highest education level: Associate degree: occupational, Scientist, product/process development, or vocational program  Occupational History   Occupation: LPN  Tobacco Use   Smoking status: Every Day    Packs/day: 1.00    Years: 10.00    Additional pack years: 0.00    Total pack years: 10.00    Types: Cigarettes   Smokeless tobacco: Never  Vaping Use   Vaping Use: Never used  Substance and Sexual Activity   Alcohol use: No   Drug use: No   Sexual activity: Yes    Birth control/protection: I.U.D.  Other  Topics Concern   Not on file  Social History Narrative   Not on file   Social Determinants of Health   Financial Resource Strain: Low Risk  (03/28/2022)   Overall Financial Resource Strain (CARDIA)    Difficulty of Paying Living Expenses: Not very hard  Food Insecurity: No Food Insecurity (03/28/2022)   Hunger Vital Sign    Worried About Running Out of Food in the Last Year: Never true    Ran Out of Food in the Last Year: Never true  Transportation Needs: No Transportation Needs (08/21/2020)   PRAPARE - Administrator, Civil Service (Medical): No    Lack of Transportation (Non-Medical): No  Physical Activity: Inactive (03/28/2022)   Exercise Vital Sign    Days of Exercise per Week: 0 days    Minutes of Exercise per Session: 0 min  Stress: No Stress Concern Present (03/28/2022)   [employer] of Occupational Health - Occupational Stress Questionnaire    Feeling of Stress :  Only a little  Social Connections: Moderately Integrated (03/28/2022)   Social Connection and Isolation Panel [NHANES]    Frequency of Communication with Friends and Family: More than three times a week    Frequency of Social Gatherings with Friends and Family: Once a week    Attends Religious Services: 1 to 4 times per year    Active Member of [employer] or Organizations: No    Attends Engineer, structural: Never    Marital Status: Married    Current Medications:  Current Outpatient Medications:    estradiol (CLIMARA) 0.1 mg/24hr patch, Place 1 patch (0.1 mg total) onto the skin once a week., Disp: 4 patch, Rfl: 12   amLODipine (NORVASC) 5 MG tablet, Take 1 tablet (5 mg total) by mouth daily., Disp: 30 tablet, Rfl: 4   levonorgestrel (MIRENA) 20 MCG/24HR IUD, 1 each by Intrauterine route once., Disp: , Rfl:    levothyroxine (SYNTHROID) 200 MCG tablet, Take 200 mcg by mouth every morning. 175 alternating with 200, Disp: , Rfl:   Review of Symptoms: Complete 10-system review is negative except as  above in Interval History.  Physical Exam: BP (!) 138/94 (BP Location: Left Arm, Patient Position: Sitting)   Pulse (!) 103   Temp 99.9 F (37.7 C) (Oral)   Resp 18   Ht 5\' 4"  (1.626 m)   Wt 223 lb (101.2 kg)   SpO2 98%   BMI 38.28 kg/m  General: Alert, oriented, no acute distress. HEENT: Normocephalic, atraumatic. Neck symmetric without masses. Sclera anicteric. Chest: Normal work of breathing. Clear to auscultation bilaterally.   Cardiovascular: Regular rate and rhythm, no murmurs. Abdomen: Soft, nontender.  Normoactive bowel sounds.  Extremities: Grossly normal range of motion.  Warm, well perfused.  No edema bilaterally. Skin: No rashes or lesions noted.  Laboratory & Radiologic Studies: MR Pelvis W Wo Contrast Oct 04, 2022  Narrative CLINICAL DATA:  Malignant struma ovarii. Previous right salpingo-oophorectomy. Left ovarian cystic lesion on recent ultrasound.  EXAM: MRI PELVIS WITHOUT AND WITH CONTRAST  TECHNIQUE: Multiplanar multisequence MR imaging of the pelvis was performed both before and after administration of intravenous contrast.  CONTRAST:  10mL GADAVIST GADOBUTROL 1 MMOL/ML IV SOLN  COMPARISON:  Ultrasound on 08/30/2022, and MRI on 12/07/2020  FINDINGS: Lower Urinary Tract: No urinary bladder or urethral abnormality identified.  Bowel: Unremarkable pelvic bowel loops.  Vascular/Lymphatic: Unremarkable. No pathologically enlarged pelvic lymph nodes identified.  Reproductive:  -- Uterus: Measures 9.7 x 5.8 by 5.8 cm (volume = 170 cm^3). No fibroids or other masses identified. IUD seen in appropriate location in the endometrial cavity. Cervix and vagina are unremarkable.  -- Right ovary: Surgically absent. No right adnexal mass identified.  -- Left ovary: Previously seen left ovarian teratoma on MRI of 2022 is no longer visualized. A simple left ovarian cyst is now seen which measures 6.0 x 4.7 by 4.1 cm. No complex features are seen.  Other:  No peritoneal thickening or abnormal free fluid.  Musculoskeletal:  Unremarkable.  IMPRESSION: 6 cm simple left ovarian cyst, with benign characteristics. This may be physiologic in a premenopausal female. Follow-up by [place] is recommended in 3 months. Note: This recommendation does not apply to premenarchal patients and to those with increased risk (genetic, family history, elevated tumor markers or other high-risk factors) of ovarian cancer. Reference: JACR 2020 Feb; 17(2):248-254  IUD in appropriate location in the endometrial cavity.   Electronically Signed By: Danae Orleans M.D. On: 04-Oct-2022 17:25

## 2022-10-22 NOTE — Progress Notes (Signed)
Patient here for follow up with Dr. Ernestina Patches and for a pre-operative appointment prior to her scheduled surgery on November 05, 2022. She is scheduled for  robotic assisted laparoscopic left ovarian cystectomy, possible unilateral salpingo-oophorectomy, possible robotic assisted laparoscopic total hysterectomy, and any other indicated procedures. The surgery was discussed in detail.  See after visit summary for additional details.    Discussed post-op pain management in detail including the aspects of the enhanced recovery pathway.  Advised her that a new prescription would be sent in for tramadol and it is only to be used for after her upcoming surgery.  We discussed the use of tylenol post-op and to monitor for a maximum of 4,000 mg in a 24 hour period.  Also prescribed sennakot to be used after surgery and to hold if having loose stools.  Discussed bowel regimen in detail.     Discussed the use of SCDs and measures to take at home to prevent DVT including frequent mobility.  Reportable signs and symptoms of DVT discussed. Post-operative instructions discussed and expectations for after surgery. Incisional care discussed as well including reportable signs and symptoms including erythema, drainage, wound separation.     10 minutes spent preparing information and with the patient.  Verbalizing understanding of material discussed. No needs or concerns voiced at the end of the visit.   Advised patient to call for any needs.  Advised that her post-operative medications had been prescribed and could be picked up at any time.    This appointment is included in the global surgical bundle as pre-operative teaching and has no charge.

## 2022-10-22 NOTE — Patient Instructions (Signed)
Preparing for your Surgery   Plan for surgery on November 05, 2022 with Dr. Bernadene Bell at [facility] will be scheduled for robotic assisted laparoscopic left ovarian cystectomy, possible unilateral salpingo-oophorectomy, possible robotic assisted laparoscopic total hysterectomy, and any other indicated procedures .    Pre-operative Testing -You will receive a phone call from presurgical testing at [facility] to arrange for a pre-operative appointment and lab work.   -Bring your insurance card, copy of an advanced directive if applicable, medication list   -At that visit, you will be asked to sign a consent for a possible blood transfusion in case a transfusion becomes necessary during surgery.  The need for a blood transfusion is rare but having consent is a necessary part of your care.      -You should not be taking blood thinners or aspirin at least ten days prior to surgery unless instructed by your surgeon.   -Do not take supplements such as fish oil (omega 3), red yeast rice, turmeric before your surgery. You want to avoid medications with aspirin in them including headache powders such as BC or Goody's), Excedrin migraine.   Day Before Surgery at [place] will be asked to take in a light diet the day before surgery. You will be advised you can have clear liquids up until 3 hours before your surgery.     Eat a light diet the day before surgery.  Examples including soups, broths, toast, yogurt, mashed potatoes.  AVOID GAS PRODUCING FOODS AND BEVERAGES. Things to avoid include carbonated beverages (fizzy beverages, sodas), raw fruits and raw vegetables (uncooked), or beans.    If your bowels are filled with gas, your surgeon will have difficulty visualizing your pelvic organs which increases your surgical risks.   Your role in recovery Your role is to become active as soon as directed by your doctor, while still giving yourself time to heal.  Rest when you feel  tired. You will be asked to do the following in order to speed your recovery:   - Cough and breathe deeply. This helps to clear and expand your lungs and can prevent pneumonia after surgery.  - [place]. Do mild physical activity. Walking or moving your legs help your circulation and body functions return to normal. Do not try to get up or walk alone the first time after surgery.   -If you develop swelling on one leg or the other, pain in the back of your leg, redness/warmth in one of your legs, please call the office or go to the Emergency Room to have a doppler to rule out a blood clot. For shortness of breath, chest pain-seek care in the Emergency Room as soon as possible. - Actively manage your pain. Managing your pain lets you move in comfort. We will ask you to rate your pain on a scale of zero to 10. It is your responsibility to tell your doctor or nurse where and how much you hurt so your pain can be treated.   Special Considerations -If you are diabetic, you may be placed on insulin after surgery to have closer control over your blood sugars to promote healing and recovery.  This does not mean that you will be discharged on insulin.  If applicable, your oral antidiabetics will be resumed when you are tolerating a solid diet.   -Your final pathology results from surgery should be available around one week after surgery and the results will be  relayed to you when available.   -Dr. Lahoma Crocker is the surgeon that assists your GYN Oncologist with surgery.  If you end up staying the night, the next day after your surgery you will either see Dr. Berline Lopes, Dr. Ernestina Patches, or Dr. Lahoma Crocker.   -FMLA forms can be faxed to [phone] and please allow 5-7 business days for completion.   Pain Management After Surgery -You have been prescribed your pain medication and bowel regimen medications before surgery so that you can have these available when you are discharged  from the hospital. The pain medication is for use ONLY AFTER surgery and a new prescription will not be given.    -Make sure that you have Tylenol and Ibuprofen IF YOU ARE ABLE TO TAKE THESE MEDICATIONS at home to use on a regular basis after surgery for pain control. We recommend alternating the medications every hour to six hours since they work differently and are processed in the body differently for pain relief.   -Review the attached handout on narcotic use and their risks and side effects.    Bowel Regimen -You have been prescribed Sennakot-S to take nightly to prevent constipation especially if you are taking the narcotic pain medication intermittently.  It is important to prevent constipation and drink adequate amounts of liquids. You can stop taking this medication when you are not taking pain medication and you are back on your normal bowel routine.   Risks of Surgery Risks of surgery are low but include bleeding, infection, damage to surrounding structures, re-operation, blood clots, and very rarely death.     Blood Transfusion Information (For the consent to be signed before surgery)   We will be checking your blood type before surgery so in case of emergencies, we will know what type of blood you would need.                                             WHAT IS A BLOOD TRANSFUSION?   A transfusion is the replacement of blood or some of its parts. Blood is made up of multiple cells which provide different functions. Red blood cells carry oxygen and are used for blood loss replacement. White blood cells fight against infection. Platelets control bleeding. Plasma helps clot blood. Other blood products are available for specialized needs, such as hemophilia or other clotting disorders. BEFORE THE TRANSFUSION  Who gives blood for transfusions?  You may be able to donate blood to be used at a later date on yourself (autologous donation). Relatives can be asked to donate blood. This  is generally not any safer than if you have received blood from a stranger. The same precautions are taken to ensure safety when a relative's blood is donated. Healthy volunteers who are fully evaluated to make sure their blood is safe. This is blood bank blood. Transfusion therapy is the safest it has ever been in the practice of medicine. Before blood is taken from a donor, a complete history is taken to make sure that person has no history of diseases nor engages in risky social behavior (examples are intravenous drug use or sexual activity with multiple partners). The donor's travel history is screened to minimize risk of transmitting infections, such as malaria. The donated blood is tested for signs of infectious diseases, such as HIV and hepatitis. The blood is then tested to be sure  it is compatible with you in order to minimize the chance of a transfusion reaction. If you or a relative donates blood, this is often done in anticipation of surgery and is not appropriate for emergency situations. It takes many days to process the donated blood. RISKS AND COMPLICATIONS Although transfusion therapy is very safe and saves many lives, the main dangers of transfusion include:  Getting an infectious disease. Developing a transfusion reaction. This is an allergic reaction to something in the blood you were given. Every precaution is taken to prevent this. The decision to have a blood transfusion has been considered carefully by your caregiver before blood is given. Blood is not given unless the benefits outweigh the risks.   AFTER SURGERY INSTRUCTIONS   Return to work: 4-6 weeks if applicable   Activity: 1. Be up and out of the bed during the day.  Take a nap if needed.  You may walk up steps but be careful and use the hand rail.  Stair climbing will tire you more than you think, you may need to stop part way and rest.    2. No lifting or straining for 6 weeks over 10 pounds. No pushing, pulling,  straining for 6 weeks.   3. No driving for around 1 week(s).  Do not drive if you are taking narcotic pain medicine and make sure that your reaction time has returned.    4. You can shower as soon as the next day after surgery. Shower daily.  Use your regular soap and water (not directly on the incision) and pat your incision(s) dry afterwards; don't rub.  No tub baths or submerging your body in water until cleared by your surgeon. If you have the soap that was given to you by pre-surgical testing that was used before surgery, you do not need to use it afterwards because this can irritate your incisions.    5. No sexual activity and nothing in the vagina for 6 weeks, 12 weeks (if you have a hysterectomy).   6. You may experience a small amount of clear drainage from your incisions, which is normal.  If the drainage persists, increases, or changes color please call the office.   7. Do not use creams, lotions, or ointments such as neosporin on your incisions after surgery until advised by your surgeon because they can cause removal of the dermabond glue on your incisions.     8. You may experience vaginal spotting after surgery or around the 6-8 week mark from surgery when the stitches at the top of the vagina begin to dissolve (if you have a hysterectomy).  The spotting is normal but if you experience heavy bleeding, call our office.   9. Take Tylenol or ibuprofen first for pain if you are able to take these medications and only use narcotic pain medication for severe pain not relieved by the Tylenol or Ibuprofen.  Monitor your Tylenol intake to a max of 4,000 mg in a 24 hour period. You can alternate these medications after surgery.   Diet: 1. Low sodium Heart Healthy Diet is recommended but you are cleared to resume your normal (before surgery) diet after your procedure.   2. It is safe to use a laxative, such as Miralax or Colace, if you have difficulty moving your bowels. You have been  prescribed Sennakot-S to take at bedtime every evening after surgery to keep bowel movements regular and to prevent constipation.     Wound Care: 1. Keep clean and  dry.  Shower daily.   Reasons to call the Doctor: Fever - Oral temperature greater than 100.4 degrees Fahrenheit Foul-smelling vaginal discharge Difficulty urinating Nausea and vomiting Increased pain at the site of the incision that is unrelieved with pain medicine. Difficulty breathing with or without chest pain New calf pain especially if only on one side Sudden, continuing increased vaginal bleeding with or without clots.   Contacts: For questions or concerns you should contact:   Dr. Bernadene Bell at [facility], NP at [phone]   After Hours: call [phone] and have the GYN Oncologist paged/contacted (after 5 pm or on the weekends). You will speak with an after hours RN and let he or she know you have had surgery.   Messages sent via mychart are for non-urgent matters and are not responded to after hours so for urgent needs, please call the after hours number.

## 2022-10-23 ENCOUNTER — Other Ambulatory Visit: Payer: Self-pay

## 2022-10-23 ENCOUNTER — Ambulatory Visit
Admission: RE | Admit: 2022-10-23 | Discharge: 2022-10-23 | Disposition: A | Discharge status: Home / Self Care | Payer: BC Managed Care – PPO | Source: Ambulatory Visit | Attending: Nurse Practitioner | Admitting: Nurse Practitioner

## 2022-10-23 ENCOUNTER — Encounter: Payer: Self-pay | Admitting: Psychiatry

## 2022-10-23 VITALS — BP 141/92 | HR 99 | Temp 98.2°F | Resp 20

## 2022-10-23 DIAGNOSIS — M545 Low back pain, unspecified: Secondary | ICD-10-CM | POA: Diagnosis not present

## 2022-10-23 MED ORDER — PREDNISONE 20 MG PO TABS: 40.0000 mg | ORAL_TABLET | Freq: Every day | ORAL | 0 refills | Status: AC

## 2022-10-23 MED ORDER — CYCLOBENZAPRINE HCL 10 MG PO TABS: 10.0000 mg | ORAL_TABLET | Freq: Two times a day (BID) | ORAL | 0 refills | Status: DC | PRN

## 2022-10-23 NOTE — ED Triage Notes (Signed)
Pt reports she was reaching out to get a towel and she heard the lower back pop x 1 day. Then she fell yesterday night due to back pain. Says it locks up. Took tylenol and ibuprofen and heat which gave slight relief.    Some meds are for her procedure on 4/16.

## 2022-10-23 NOTE — ED Provider Notes (Signed)
[facility]    CSN: [id number] Arrival date & time: 10/23/22  1657      History   Chief Complaint Chief Complaint  Patient presents with   Back Pain    Muscle spasm/lower back pain - Entered by patient    HPI Marissa Lane is a 32 y.o. female.   The history is provided by the patient.   Patient presents for complaints of mid low back pain that started over the past 24 hours.  Patient states she was reaching to get a towel and when she heard a "pop" in her lower back.  She also states that she felt it.  Patient states that she also fell yesterday because of the back pain.  States that sitting up helps decrease her back pain, back pain is made worse with lying flat.  Patient states she has a history of the same or similar symptoms.  States that the back locks up from time to time.  She states her last episode was more than a year ago.  She denies any numbness, tingling, radiation of pain, lower extremity weakness, or loss of bowel or bladder function.  Patient states she has been taking Tylenol and ibuprofen and using heat that have given her some relief.  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   Mass of soft tissue of shoulder 03/20/2021   Papillary thyroid carcinoma 03/20/2021   Lipoma of back 01/18/2021   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   Cyst of right ovary 01/02/2021   Cyst of ovary,  left 11/02/2020   Encounter for IUD insertion 11/02/2020   Pregnancy examination or test, negative result 11/02/2020   GAD (generalized anxiety disorder) 08/24/2019   Attention deficit hyperactivity disorder (ADHD), 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   Cervical high risk HPV (human papillomavirus) test positive 01/09/2017   History of gestational diabetes 07/19/2016   Chronic hypertension 04/30/2016   Smoker 03/05/2016   Migraine without aura and with status migrainosus, not intractable 04/06/2015   Anxiety 09/21/2013   PCOS (polycystic ovarian syndrome) 12/11/2012   Erosive esophagitis 11/05/2012   Morbid obesity 09/14/2010    Past Surgical History:  Procedure Laterality Date   CHOLECYSTECTOMY  03/06/2012   Procedure: LAPAROSCOPIC CHOLECYSTECTOMY;  Surgeon: Donato Heinz, MD;  Location: [facility];  Service: General;  Laterality: N/A;   ESOPHAGOGASTRODUODENOSCOPY N/A 09/09/2012   Procedure: ESOPHAGOGASTRODUODENOSCOPY (EGD);  Surgeon: Rogene Houston, MD;  Location: [facility];  Service: Endoscopy;  Laterality: N/A;  340   MASS EXCISION Right 03/23/2021   Procedure: EXCISION SOFT TISSUE MASS ON RIGHT SHOULDER;  Surgeon: Armandina Gemma, MD;  Location: [facility];  Service: General;  Laterality: Right;   MOUTH SURGERY     ROBOTIC ASSISTED LAPAROSCOPIC OVARIAN CYSTECTOMY Left 01/02/2021  Procedure: XI ROBOTIC ASSISTED LEFT LAPAROSCOPIC OVARIAN CYSTECTOMY;  Surgeon: Everitt Amber, MD;  Location: [facility];  Service: Gynecology;  Laterality: Left;   ROBOTIC ASSISTED SALPINGO OOPHERECTOMY Right 01/02/2021   Procedure: XI ROBOTIC ASSISTED RIGHT SALPINGO OOPHORECTOMY;  Surgeon: Everitt Amber, MD;  Location: [facility];  Service: Gynecology;  Laterality: Right;   SKIN GRAFT  2009   gum graft   THYROIDECTOMY N/A 03/23/2021   Procedure: TOTAL THYROIDECTOMY;  Surgeon: Armandina Gemma, MD;  Location: [facility];  Service: General;  Laterality: N/A;   TONSILLECTOMY      OB History     Gravida  3   Para  3   Term  3   Preterm       AB      Living  3      SAB      IAB      Ectopic      Multiple  0   Live Births  3            Home Medications    Prior to Admission medications   Medication Sig Start Date End Date Taking? Authorizing Provider  amLODipine (NORVASC) 5 MG tablet Take 1 tablet (5 mg total) by mouth daily. 04/15/22   Kathyrn Drown, MD  cyclobenzaprine (FLEXERIL) 10 MG tablet Take 1 tablet (10 mg total) by mouth 2 (two) times daily as needed for muscle spasms. 10/23/22  Yes Selam Pietsch-Warren, Alda Lea, NP  estradiol (CLIMARA) 0.1 mg/24hr patch Place 1 patch (0.1 mg total) onto the skin once a week. 10/21/22   Bernadene Bell, MD  ibuprofen (ADVIL) 800 MG tablet Take 1 tablet (800 mg total) by mouth every 12 (twelve) hours as needed for moderate pain. For AFTER surgery only 10/21/22   Joylene John D, NP  levonorgestrel (MIRENA) 20 MCG/24HR IUD 1 each by Intrauterine route once.    [provider]  levothyroxine (SYNTHROID) 200 MCG tablet Take 200 mcg by mouth every morning. 175 alternating with 200 07/13/21   [provider]  predniSONE (DELTASONE) 20 MG tablet Take 2 tablets (40 mg total) by mouth daily with breakfast for 5 days. 10/23/22 10/28/22 Yes Euriah Matlack-Warren, Alda Lea, NP  senna-docusate (SENOKOT-S) 8.6-50 MG tablet Take 2 tablets by mouth at bedtime. For AFTER surgery, do not take if having diarrhea 10/21/22   Joylene John D, NP  traMADol (ULTRAM) 50 MG tablet Take 1 tablet (50 mg total) by mouth every 6 (six) hours as needed for severe pain. For AFTER surgery only, do not take and drive [id number]   Joylene John D, NP    Family History Family History  Adopted: Yes  Problem Relation Age of Onset   Congestive Heart Failure Mother    Irritable bowel syndrome Mother    Ulcers Mother    Anemia Mother    Drug abuse Mother    Hypertension Brother    Colon cancer Maternal Grandmother 39   Anxiety disorder Maternal Grandmother    Heart disease Maternal Grandfather 68        MI    Social History Social History   Tobacco Use   Smoking status: Every Day    Packs/day: 1.00    Years: 10.00    Additional pack years: 0.00    Total pack years: 10.00    Types: Cigarettes   Smokeless tobacco: Never  Vaping Use   Vaping Use: Never used  Substance Use Topics   Alcohol use: No   Drug use: No  Allergies   Other, Bupropion, Cymbalta [duloxetine hcl], Oxycodone, Silicone, Vicodin [hydrocodone-acetaminophen], and Zofran [ondansetron hcl]   Review of Systems Review of Systems Per HPI  Physical Exam Triage Vital Signs ED Triage Vitals  Enc Vitals Group     BP 10/23/22 1711 (!) 141/92     Pulse Rate 10/23/22 1711 99     Resp 10/23/22 1711 20     Temp 10/23/22 1711 98.2 F (36.8 C)     Temp Source 10/23/22 1711 Oral     SpO2 10/23/22 1711 94 %     Weight --      Height --      Head Circumference --      Peak Flow --      Pain Score 10/23/22 1714 8     Pain Loc --      Pain Edu? --      Excl. in [place]? --    No data found.  Updated Vital Signs BP (!) 141/92 (BP Location: Right Arm)   Pulse 99   Temp 98.2 F (36.8 C) (Oral)   Resp 20   LMP 05/29/2022 (Approximate)   SpO2 94%   Visual Acuity Right Eye Distance:   Left Eye Distance:   Bilateral Distance:    Right Eye Near:   Left Eye Near:    Bilateral Near:     Physical Exam Vitals and nursing note reviewed.  Constitutional:      Appearance: Normal appearance. She is not toxic-appearing.  HENT:     Head: Normocephalic.  Eyes:     Extraocular Movements: Extraocular movements intact.     Pupils: Pupils are equal, round, and reactive to light.  Cardiovascular:     Rate and Rhythm: Regular rhythm.     Pulses: Normal pulses.     Heart sounds: Normal heart sounds.  Pulmonary:     Effort: Pulmonary effort is normal. No respiratory distress.     Breath sounds: Normal breath sounds. No stridor. No wheezing, rhonchi or rales.  Abdominal:     General: Bowel sounds are normal.      Palpations: Abdomen is soft.     Tenderness: There is no abdominal tenderness.  Musculoskeletal:     Cervical back: Normal range of motion.     Lumbar back: Spasms present. No swelling, deformity, signs of trauma, lacerations or tenderness. Decreased range of motion.  Skin:    General: Skin is warm and dry.  Neurological:     General: No focal deficit present.     Mental Status: She is alert and oriented to person, place, and time.  Psychiatric:        Mood and Affect: Mood normal.        Behavior: Behavior normal.      UC Treatments / Results  Labs (all labs ordered are listed, but only abnormal results are displayed) Labs Reviewed - No data to display  EKG   Radiology No results found.  Procedures Procedures (including critical care time)  Medications Ordered in UC Medications - No data to display  Initial Impression / Assessment and Plan / UC Course  I have reviewed the triage vital signs and the nursing notes.  Pertinent labs & imaging results that were available during my care of the patient were reviewed by me and considered in my medical decision making (see chart for details).  The patient is well-appearing, she is in no acute distress, vital signs are stable.  Patient appears to experience a lumbar sprain/strain.  Patient was prescribed Flexeril 10 mg to help with back spasm.  Prednisone 40 mg was prescribed; however, patient has an upcoming surgery.  Patient was advised to follow-up with the physician to ensure that use of prednisone will not delay her surgery.  Patient was advised to use Tylenol arthritis strength 650 mg tablets for her back pain or discomfort.  Supportive care recommendations were provided to the patient to include use of ice or heat, gentle stretching and range of motion exercises, along with remaining active.  Patient was given strict ER follow-up precautions.  Patient is in agreement with this plan of care and verbalizes understanding.  All  questions were answered.  Patient stable for discharge.  Work note was provided.  Final Clinical Impressions(s) / UC Diagnoses   Final diagnoses:  Midline low back pain without sciatica, unspecified chronicity     Discharge Instructions          Take medication as prescribed. Try to remain as active as possible. Gentle range of motion and stretching exercises to help with back spasm and pain. May apply ice or heat as needed.  Ice is recommended for pain or swelling, heat for spasm or stiffness.  Apply for 20 minutes, remove for 1 hour, then repeat. Recommend Tylenol arthritis strength 650 mg tablets every 8 hours as needed for back pain or discomfort. As discussed, please contact your physician who will be performing her surgery to ensure that it is safe for you to take steroids. Go to the emergency department immediately if you develop weakness in your legs or feet, inability to walk, loss of bowel or bladder function, difficulty urinating or passing a bowel movement, or other concerns. Follow-up with your primary care physician if your symptoms do not improve.      ED Prescriptions     Medication Sig Dispense Auth. Provider   cyclobenzaprine (FLEXERIL) 10 MG tablet Take 1 tablet (10 mg total) by mouth 2 (two) times daily as needed for muscle spasms. 20 tablet Aevah Stansbery-Warren, Alda Lea, NP   predniSONE (DELTASONE) 20 MG tablet Take 2 tablets (40 mg total) by mouth daily with breakfast for 5 days. 10 tablet Bailee Thall-Warren, Alda Lea, NP      PDMP not reviewed this encounter.   Tish Men, NP 10/23/22 1806

## 2022-10-23 NOTE — Discharge Instructions (Addendum)
  Take medication as prescribed. Try to remain as active as possible. Gentle range of motion and stretching exercises to help with back spasm and pain. May apply ice or heat as needed.  Ice is recommended for pain or swelling, heat for spasm or stiffness.  Apply for 20 minutes, remove for 1 hour, then repeat. Recommend Tylenol arthritis strength 650 mg tablets every 8 hours as needed for back pain or discomfort. As discussed, please contact your physician who will be performing her surgery to ensure that it is safe for you to take steroids. Go to the emergency department immediately if you develop weakness in your legs or feet, inability to walk, loss of bowel or bladder function, difficulty urinating or passing a bowel movement, or other concerns. Follow-up with your primary care physician if your symptoms do not improve.

## 2022-10-28 NOTE — Progress Notes (Signed)
COVID Vaccine Completed:  Yes  Date of COVID positive in last 90 days:  PCP - [name], MD Cardiologist - [name], MD (last OV 2012)  Chest x-ray -  EKG - 12-07-21 Epic Stress Test -  ECHO -  Cardiac Cath -  Pacemaker/ICD device last checked: Spinal Cord Stimulator:  Bowel Prep -   Sleep Study -  CPAP -   Fasting Blood Sugar -  Checks Blood Sugar _____ times a day  Last dose of GLP1 agonist-  N/A GLP1 instructions:  N/A   Last dose of SGLT-2 inhibitors-  N/A SGLT-2 instructions: N/A   Blood Thinner Instructions: Aspirin Instructions: Last Dose:  Activity level:  Can go up a flight of stairs and perform activities of daily living without stopping and without symptoms of chest pain or shortness of breath.  Able to exercise without symptoms  Unable to go up a flight of stairs without symptoms of     Anesthesia review:   Patient denies shortness of breath, fever, cough and chest pain at PAT appointment  Patient verbalized understanding of instructions that were given to them at the PAT appointment. Patient was also instructed that they will need to review over the PAT instructions again at home before surgery.

## 2022-10-28 NOTE — Patient Instructions (Signed)
SURGICAL WAITING ROOM VISITATION  Patients having surgery or a procedure may have no more than 2 support people in the waiting area - these visitors may rotate.    Children under the age of 33 must have an adult with them who is not the patient.  Due to an increase in RSV and influenza rates and associated hospitalizations, children ages 4812 and under may not visit patients in [facility] hospitals.  If the patient needs to stay at the hospital during part of their recovery, the visitor guidelines for inpatient rooms apply. Pre-op nurse will coordinate an appropriate time for 1 support person to accompany patient in pre-op.  This support person may not rotate.    Please refer to the [facility] website for the visitor guidelines for Inpatients (after your surgery is over and you are in a regular room).       Your procedure is scheduled on: 11-05-22   [facility]    Report to admitting at 10:25 AM   Call this number if you have problems the morning of surgery [phone]   Follow a light diet the day before surgery (avoid gas producing foods)   Do not eat food :After Midnight.   After Midnight you may have the following liquids until 9:30 AM DAY OF SURGERY  Water Non-Citrus Juices (without pulp, NO RED-Apple, White grape, White cranberry) Black Coffee (NO MILK/CREAM OR CREAMERS, sugar ok)  Clear Tea (NO MILK/CREAM OR CREAMERS, sugar ok) regular and decaf                             Plain Jell-O (NO RED)                                           Fruit ices (not with fruit pulp, NO RED)                                     Popsicles (NO RED)                                                               Sports drinks like Gatorade (NO RED)                  If you have questions, please contact your surgeon's office.   FOLLOW  ANY ADDITIONAL PRE OP INSTRUCTIONS YOU RECEIVED FROM YOUR SURGEON'S OFFICE!!!     Oral Hygiene is also important to reduce  your risk of infection.                                    Remember - BRUSH YOUR TEETH THE MORNING OF SURGERY WITH YOUR REGULAR TOOTHPASTE  DENTURES WILL BE REMOVED PRIOR TO SURGERY PLEASE DO NOT APPLY "Poly grip" OR ADHESIVES!!!   Do NOT smoke after Midnight   Take these medicines the morning of surgery with A SIP OF WATER:   Amlodipine  Pepcid  Levothyroxine  Prednisone  Tylenol if needed  DO NOT TAKE ANY ORAL DIABETIC MEDICATIONS DAY OF YOUR SURGERY  Bring CPAP mask and tubing day of surgery.                              You may not have any metal on your body including hair pins, jewelry, and body piercing             Do not wear make-up, lotions, powders, perfumes or deodorant  Do not wear nail polish including gel and S&S, artificial/acrylic nails, or any other type of covering on natural nails including finger and toenails. If you have artificial nails, gel coating, etc. that needs to be removed by a nail salon please have this removed prior to surgery or surgery may need to be canceled/ delayed if the surgeon/ anesthesia feels like they are unable to be safely monitored.   Do not shave  48 hours prior to surgery.    Do not bring valuables to the hospital. [facility] IS NOT  RESPONSIBLE   FOR VALUABLES.   Contacts, glasses, dentures or bridgework may not be worn into surgery.  DO NOT BRING YOUR HOME MEDICATIONS TO THE HOSPITAL. PHARMACY WILL DISPENSE MEDICATIONS LISTED ON YOUR MEDICATION LIST TO YOU DURING YOUR ADMISSION IN THE HOSPITAL!    Patients discharged on the day of surgery will not be allowed to drive home.  Someone NEEDS to stay with you for the first 24 hours after anesthesia.   Special Instructions: Bring a copy of your healthcare power of attorney and living will documents the day of surgery if you haven't scanned them before.              Please read over the following fact sheets you were given: IF YOU HAVE QUESTIONS ABOUT YOUR PRE-OP INSTRUCTIONS PLEASE  CALL [phone] [place]   If you received a COVID test during your pre-op visit  it is requested that you wear a mask when out in public, stay away from anyone that may not be feeling well and notify your surgeon if you develop symptoms. If you test positive for Covid or have been in contact with anyone that has tested positive in the last 10 days please notify you surgeon.    [facility] - Preparing for Surgery Before surgery, you can play an important role.  Because skin is not sterile, your skin needs to be as free of germs as possible.  You can reduce the number of germs on your skin by washing with CHG (chlorahexidine gluconate) soap before surgery.  CHG is an antiseptic cleaner which kills germs and bonds with the skin to continue killing germs even after washing. Please DO NOT use if you have an allergy to CHG or antibacterial soaps.  If your skin becomes reddened/irritated stop using the CHG and inform your nurse when you arrive at Short Stay. Do not shave (including legs and underarms) for at least 48 hours prior to the first CHG shower.  You may shave your face/neck.  Please follow these instructions carefully:  1.  Shower with CHG Soap the night before surgery and the  morning of surgery.  2.  If you choose to wash your hair, wash your hair first as usual with your normal  shampoo.  3.  After you shampoo, rinse your hair and body thoroughly to remove the shampoo.  4.  Use CHG as you would any other liquid soap.  You can apply chg directly to the skin and wash.  Gently with a scrungie or clean washcloth.  5.  Apply the CHG Soap to your body ONLY FROM THE NECK DOWN.   Do   not use on face/ open                           Wound or open sores. Avoid contact with eyes, ears mouth and   genitals (private parts).                       Wash face,  Genitals (private parts) with your normal soap.             6.  Wash thoroughly, paying special attention to the area where  your    surgery  will be performed.  7.  Thoroughly rinse your body with warm water from the neck down.  8.  DO NOT shower/wash with your normal soap after using and rinsing off the CHG Soap.                9.  Pat yourself dry with a clean towel.            10.  Wear clean pajamas.            11.  Place clean sheets on your bed the night of your first shower and do not  sleep with pets. Day of Surgery : Do not apply any lotions/deodorants the morning of surgery.  Please wear clean clothes to the hospital/surgery center.  FAILURE TO FOLLOW THESE INSTRUCTIONS MAY RESULT IN THE CANCELLATION OF YOUR SURGERY  PATIENT SIGNATURE_________________________________  NURSE SIGNATURE__________________________________  ________________________________________________________________________ WHAT IS A BLOOD TRANSFUSION? Blood Transfusion Information  A transfusion is the replacement of blood or some of its parts. Blood is made up of multiple cells which provide different functions. Red blood cells carry oxygen and are used for blood loss replacement. White blood cells fight against infection. Platelets control bleeding. Plasma helps clot blood. Other blood products are available for specialized needs, such as hemophilia or other clotting disorders. BEFORE THE TRANSFUSION  Who gives blood for transfusions?  Healthy volunteers who are fully evaluated to make sure their blood is safe. This is blood bank blood. Transfusion therapy is the safest it has ever been in the practice of medicine. Before blood is taken from a donor, a complete history is taken to make sure that person has no history of diseases nor engages in risky social behavior (examples are intravenous drug use or sexual activity with multiple partners). The donor's travel history is screened to minimize risk of transmitting infections, such as malaria. The donated blood is tested for signs of infectious diseases, such as HIV and hepatitis. The  blood is then tested to be sure it is compatible with you in order to minimize the chance of a transfusion reaction. If you or a relative donates blood, this is often done in anticipation of surgery and is not appropriate for emergency situations. It takes many days to process the donated blood. RISKS AND COMPLICATIONS Although transfusion therapy is very safe and saves many lives, the main dangers of transfusion include:  Getting an infectious disease. Developing a transfusion reaction. This is an allergic reaction to something in the blood you were given. Every precaution is taken to prevent this. The decision to have  a blood transfusion has been considered carefully by your caregiver before blood is given. Blood is not given unless the benefits outweigh the risks. AFTER THE TRANSFUSION Right after receiving a blood transfusion, you will usually feel much better and more energetic. This is especially true if your red blood cells have gotten low (anemic). The transfusion raises the level of the red blood cells which carry oxygen, and this usually causes an energy increase. The nurse administering the transfusion will monitor you carefully for complications. HOME CARE INSTRUCTIONS  No special instructions are needed after a transfusion. You may find your energy is better. Speak with your caregiver about any limitations on activity for underlying diseases you may have. SEEK MEDICAL CARE IF:  Your condition is not improving after your transfusion. You develop redness or irritation at the intravenous (IV) site. SEEK IMMEDIATE MEDICAL CARE IF:  Any of the following symptoms occur over the next 12 hours: Shaking chills. You have a temperature by mouth above 102 F (38.9 C), not controlled by medicine. Chest, back, or muscle pain. People around you feel you are not acting correctly or are confused. Shortness of breath or difficulty breathing. Dizziness and fainting. You get a rash or develop  hives. You have a decrease in urine output. Your urine turns a dark color or changes to pink, red, or brown. Any of the following symptoms occur over the next 10 days: You have a temperature by mouth above 102 F (38.9 C), not controlled by medicine. Shortness of breath. Weakness after normal activity. The white part of the eye turns yellow (jaundice). You have a decrease in the amount of urine or are urinating less often. Your urine turns a dark color or changes to pink, red, or brown. Document Released: 07/05/2000 Document Revised: 09/30/2011 Document Reviewed: 02/22/2008 [facility] Patient Information 2014 [place], [place].  _______________________________________________________________________

## 2022-10-30 ENCOUNTER — Other Ambulatory Visit: Payer: Self-pay

## 2022-10-30 ENCOUNTER — Encounter (HOSPITAL_COMMUNITY)
Admission: RE | Admit: 2022-10-30 | Discharge: 2022-10-30 | Disposition: A | Discharge status: Home / Self Care | Payer: BC Managed Care – PPO | Source: Ambulatory Visit | Attending: Psychiatry | Admitting: Psychiatry

## 2022-10-30 ENCOUNTER — Encounter (HOSPITAL_COMMUNITY): Payer: Self-pay

## 2022-10-30 VITALS — BP 149/98 | HR 97 | Temp 98.5°F | Resp 16 | Ht 64.0 in | Wt 232.0 lb

## 2022-10-30 DIAGNOSIS — C561 Malignant neoplasm of right ovary: Secondary | ICD-10-CM | POA: Insufficient documentation

## 2022-10-30 DIAGNOSIS — Z01818 Encounter for other preprocedural examination: Secondary | ICD-10-CM

## 2022-10-30 DIAGNOSIS — Z01812 Encounter for preprocedural laboratory examination: Secondary | ICD-10-CM | POA: Diagnosis present

## 2022-10-30 DIAGNOSIS — E119 Type 2 diabetes mellitus without complications: Secondary | ICD-10-CM | POA: Insufficient documentation

## 2022-10-30 DIAGNOSIS — N83202 Unspecified ovarian cyst, left side: Secondary | ICD-10-CM

## 2022-10-30 HISTORY — DX: Methicillin resistant Staphylococcus aureus infection, unspecified site: A49.02

## 2022-10-30 HISTORY — DX: Malignant neoplasm of unspecified ovary: C56.9

## 2022-10-30 HISTORY — DX: Hypothyroidism, unspecified: E03.9

## 2022-10-30 LAB — SURGICAL PCR SCREEN
MRSA, PCR: NEGATIVE
Staphylococcus aureus: NEGATIVE

## 2022-10-30 LAB — CBC
HCT: 43.5 % (ref 36.0–46.0)
Hemoglobin: 14.5 g/dL (ref 12.0–15.0)
MCH: 30.4 pg (ref 26.0–34.0)
MCHC: 33.3 g/dL (ref 30.0–36.0)
MCV: 91.2 fL (ref 80.0–100.0)
Platelets: 323 10*3/uL (ref 150–400)
RBC: 4.77 MIL/uL (ref 3.87–5.11)
RDW: 11.9 % (ref 11.5–15.5)
WBC: 7.9 10*3/uL (ref 4.0–10.5)
nRBC: 0 % (ref 0.0–0.2)

## 2022-10-30 LAB — COMPREHENSIVE METABOLIC PANEL
ALT: 32 U/L (ref 0–44)
AST: 25 U/L (ref 15–41)
Albumin: 4.1 g/dL (ref 3.5–5.0)
Alkaline Phosphatase: 57 U/L (ref 38–126)
Anion gap: 9 (ref 5–15)
BUN: 12 mg/dL (ref 6–20)
CO2: 23 mmol/L (ref 22–32)
Calcium: 8.9 mg/dL (ref 8.9–10.3)
Chloride: 104 mmol/L (ref 98–111)
Creatinine, Ser: 0.62 mg/dL (ref 0.44–1.00)
GFR, Estimated: >60 mL/min (ref >60)
Glucose, Bld: 110 mg/dL — ABNORMAL HIGH (ref 70–99)
Potassium: 4.1 mmol/L (ref 3.5–5.1)
Sodium: 136 mmol/L (ref 135–145)
Total Bilirubin: 0.3 mg/dL (ref 0.3–1.2)
Total Protein: 7.8 g/dL (ref 6.5–8.1)

## 2022-10-30 NOTE — Progress Notes (Signed)
   10/30/22 0847  OBSTRUCTIVE SLEEP APNEA  Have you ever been diagnosed with sleep apnea through a sleep study? No  Do you snore loudly (loud enough to be heard through closed doors)?  1  Do you often feel tired, fatigued, or sleepy during the daytime (such as falling asleep during driving or talking to someone)? 1  Has anyone observed you stop breathing during your sleep? 1  Do you have, or are you being treated for high blood pressure? 1  BMI more than 35 kg/m2? 1  Age > 50 (1-yes) 0  Neck circumference greater than:Female 16 inches or larger, Female 17inches or larger? 1  Female Gender (Yes=1) 0  Obstructive Sleep Apnea Score 6  Score 5 or greater  Results sent to PCP

## 2022-10-31 ENCOUNTER — Encounter: Payer: Self-pay | Admitting: Family Medicine

## 2022-11-04 ENCOUNTER — Telehealth: Payer: Self-pay | Admitting: Surgery

## 2022-11-04 NOTE — Telephone Encounter (Signed)
Telephone call to check on pre-operative status.  Patient compliant with pre-operative instructions.  Reinforced nothing to eat after midnight. Clear liquids until 9:30am. Patient to arrive at 10:30am. Verified that post-op medications have been sent to patient's preferred pharmacy.  No questions or concerns voiced.  Instructed to call for any needs.

## 2022-11-04 NOTE — Telephone Encounter (Signed)
Pre-op call attempt. LVM.

## 2022-11-05 ENCOUNTER — Other Ambulatory Visit: Payer: Self-pay

## 2022-11-05 ENCOUNTER — Encounter (HOSPITAL_COMMUNITY)
Admission: RE | Disposition: A | Discharge status: Home / Self Care | Payer: Self-pay | Source: Ambulatory Visit | Attending: Psychiatry

## 2022-11-05 ENCOUNTER — Encounter (HOSPITAL_COMMUNITY): Payer: Self-pay | Admitting: Psychiatry

## 2022-11-05 ENCOUNTER — Ambulatory Visit (HOSPITAL_COMMUNITY): Payer: BC Managed Care – PPO | Admitting: Certified Registered"

## 2022-11-05 ENCOUNTER — Ambulatory Visit (HOSPITAL_COMMUNITY)
Admission: RE | Admit: 2022-11-05 | Discharge: 2022-11-05 | Disposition: A | Discharge status: Home / Self Care | Payer: BC Managed Care – PPO | Source: Ambulatory Visit | Attending: Psychiatry | Admitting: Psychiatry

## 2022-11-05 DIAGNOSIS — Z8543 Personal history of malignant neoplasm of ovary: Secondary | ICD-10-CM | POA: Insufficient documentation

## 2022-11-05 DIAGNOSIS — Z79899 Other long term (current) drug therapy: Secondary | ICD-10-CM | POA: Diagnosis not present

## 2022-11-05 DIAGNOSIS — N83202 Unspecified ovarian cyst, left side: Secondary | ICD-10-CM

## 2022-11-05 DIAGNOSIS — Z90721 Acquired absence of ovaries, unilateral: Secondary | ICD-10-CM | POA: Diagnosis not present

## 2022-11-05 DIAGNOSIS — C561 Malignant neoplasm of right ovary: Secondary | ICD-10-CM

## 2022-11-05 DIAGNOSIS — Z79890 Hormone replacement therapy (postmenopausal): Secondary | ICD-10-CM | POA: Diagnosis not present

## 2022-11-05 DIAGNOSIS — Z6838 Body mass index (BMI) 38.0-38.9, adult: Secondary | ICD-10-CM | POA: Insufficient documentation

## 2022-11-05 DIAGNOSIS — Z9049 Acquired absence of other specified parts of digestive tract: Secondary | ICD-10-CM | POA: Insufficient documentation

## 2022-11-05 DIAGNOSIS — E669 Obesity, unspecified: Secondary | ICD-10-CM | POA: Insufficient documentation

## 2022-11-05 DIAGNOSIS — I10 Essential (primary) hypertension: Secondary | ICD-10-CM | POA: Diagnosis not present

## 2022-11-05 DIAGNOSIS — K219 Gastro-esophageal reflux disease without esophagitis: Secondary | ICD-10-CM | POA: Diagnosis not present

## 2022-11-05 DIAGNOSIS — F17210 Nicotine dependence, cigarettes, uncomplicated: Secondary | ICD-10-CM | POA: Insufficient documentation

## 2022-11-05 DIAGNOSIS — E119 Type 2 diabetes mellitus without complications: Secondary | ICD-10-CM | POA: Diagnosis not present

## 2022-11-05 HISTORY — PX: ROBOTIC ASSISTED LAPAROSCOPIC OVARIAN CYSTECTOMY: SHX6081

## 2022-11-05 LAB — HEMOGLOBIN A1C
Hgb A1c MFr Bld: 5.7 % — ABNORMAL HIGH (ref 4.8–5.6)
Mean Plasma Glucose: 116.89 mg/dL

## 2022-11-05 LAB — TYPE AND SCREEN
ABO/RH(D): A POS
Antibody Screen: NEGATIVE

## 2022-11-05 LAB — POCT PREGNANCY, URINE: Preg Test, Ur: NEGATIVE

## 2022-11-05 SURGERY — EXCISION, CYST, OVARY, ROBOT-ASSISTED, LAPAROSCOPIC
Anesthesia: General | Site: Abdomen | Laterality: Left

## 2022-11-05 MED ORDER — ACETAMINOPHEN 500 MG PO TABS: 1000.0000 mg | ORAL_TABLET | Freq: Once | ORAL | Status: DC

## 2022-11-05 MED ORDER — HEMOSTATIC AGENTS (NO CHARGE) OPTIME
TOPICAL | Status: DC | PRN
  Administered 2022-11-05: 1 via TOPICAL

## 2022-11-05 MED ORDER — FENTANYL CITRATE (PF) 100 MCG/2ML IJ SOLN
INTRAMUSCULAR | Status: AC
  Filled 2022-11-05: qty 2

## 2022-11-05 MED ORDER — GABAPENTIN 300 MG PO CAPS
300.0000 mg | ORAL_CAPSULE | ORAL | Status: AC
  Administered 2022-11-05: 300 mg via ORAL
  Filled 2022-11-05: qty 1

## 2022-11-05 MED ORDER — KETAMINE HCL 50 MG/5ML IJ SOSY
PREFILLED_SYRINGE | INTRAMUSCULAR | Status: AC
  Filled 2022-11-05: qty 5

## 2022-11-05 MED ORDER — LACTATED RINGERS IV SOLN: INTRAVENOUS | Status: DC | PRN

## 2022-11-05 MED ORDER — FENTANYL CITRATE PF 50 MCG/ML IJ SOSY
25.0000 ug | PREFILLED_SYRINGE | INTRAMUSCULAR | Status: DC | PRN
  Administered 2022-11-05 (×3): 25 ug via INTRAVENOUS
  Administered 2022-11-05: 50 ug via INTRAVENOUS

## 2022-11-05 MED ORDER — FENTANYL CITRATE PF 50 MCG/ML IJ SOSY
PREFILLED_SYRINGE | INTRAMUSCULAR | Status: AC
  Administered 2022-11-05: 25 ug via INTRAVENOUS
  Filled 2022-11-05: qty 3

## 2022-11-05 MED ORDER — DEXAMETHASONE SODIUM PHOSPHATE 4 MG/ML IJ SOLN: 4.0000 mg | INTRAMUSCULAR | Status: DC

## 2022-11-05 MED ORDER — LIDOCAINE 2% (20 MG/ML) 5 ML SYRINGE
INTRAMUSCULAR | Status: DC | PRN
  Administered 2022-11-05: 60 mg via INTRAVENOUS

## 2022-11-05 MED ORDER — PROPOFOL 10 MG/ML IV BOLUS
INTRAVENOUS | Status: DC | PRN
  Administered 2022-11-05: 150 mg via INTRAVENOUS
  Administered 2022-11-05: 50 mg via INTRAVENOUS

## 2022-11-05 MED ORDER — FAMOTIDINE IN NACL 20-0.9 MG/50ML-% IV SOLN: 20.0000 mg | Freq: Once | INTRAVENOUS | Status: AC

## 2022-11-05 MED ORDER — SILVER NITRATE-POT NITRATE 75-25 % EX MISC
CUTANEOUS | Status: AC
  Filled 2022-11-05: qty 10

## 2022-11-05 MED ORDER — STERILE WATER FOR IRRIGATION IR SOLN
Status: DC | PRN
  Administered 2022-11-05: 1000 mL

## 2022-11-05 MED ORDER — KETOROLAC TROMETHAMINE 30 MG/ML IJ SOLN: 30.0000 mg | Freq: Once | INTRAMUSCULAR | Status: AC | PRN

## 2022-11-05 MED ORDER — AMISULPRIDE (ANTIEMETIC) 5 MG/2ML IV SOLN
INTRAVENOUS | Status: AC
  Administered 2022-11-05: 10 mg via INTRAVENOUS
  Filled 2022-11-05: qty 4

## 2022-11-05 MED ORDER — LACTATED RINGERS IR SOLN
Status: DC | PRN
  Administered 2022-11-05: 1000 mL

## 2022-11-05 MED ORDER — SUGAMMADEX SODIUM 200 MG/2ML IV SOLN
INTRAVENOUS | Status: DC | PRN
  Administered 2022-11-05: 400 mg via INTRAVENOUS

## 2022-11-05 MED ORDER — FAMOTIDINE IN NACL 20-0.9 MG/50ML-% IV SOLN
INTRAVENOUS | Status: AC
  Administered 2022-11-05: 20 mg via INTRAVENOUS
  Filled 2022-11-05: qty 50

## 2022-11-05 MED ORDER — HEPARIN SODIUM (PORCINE) 5000 UNIT/ML IJ SOLN
5000.0000 [IU] | INTRAMUSCULAR | Status: AC
  Administered 2022-11-05: 5000 [IU] via SUBCUTANEOUS
  Filled 2022-11-05: qty 1

## 2022-11-05 MED ORDER — ROCURONIUM BROMIDE 10 MG/ML (PF) SYRINGE
PREFILLED_SYRINGE | INTRAVENOUS | Status: DC | PRN
  Administered 2022-11-05: 60 mg via INTRAVENOUS
  Administered 2022-11-05 (×2): 20 mg via INTRAVENOUS

## 2022-11-05 MED ORDER — MIDAZOLAM HCL 2 MG/2ML IJ SOLN
INTRAMUSCULAR | Status: DC | PRN
  Administered 2022-11-05: 2 mg via INTRAVENOUS

## 2022-11-05 MED ORDER — KETOROLAC TROMETHAMINE 30 MG/ML IJ SOLN
INTRAMUSCULAR | Status: AC
  Administered 2022-11-05: 30 mg via INTRAVENOUS
  Filled 2022-11-05: qty 1

## 2022-11-05 MED ORDER — PROPOFOL 10 MG/ML IV BOLUS
INTRAVENOUS | Status: AC
  Filled 2022-11-05: qty 20

## 2022-11-05 MED ORDER — SCOPOLAMINE 1 MG/3DAYS TD PT72
1.0000 | MEDICATED_PATCH | TRANSDERMAL | Status: DC
  Administered 2022-11-05: 1.5 mg via TRANSDERMAL
  Filled 2022-11-05: qty 1

## 2022-11-05 MED ORDER — KETAMINE HCL 10 MG/ML IJ SOLN
INTRAMUSCULAR | Status: DC | PRN
  Administered 2022-11-05: 20 mg via INTRAVENOUS
  Administered 2022-11-05: 10 mg via INTRAVENOUS

## 2022-11-05 MED ORDER — AMISULPRIDE (ANTIEMETIC) 5 MG/2ML IV SOLN: 10.0000 mg | Freq: Once | INTRAVENOUS | Status: AC

## 2022-11-05 MED ORDER — ROCURONIUM BROMIDE 10 MG/ML (PF) SYRINGE
PREFILLED_SYRINGE | INTRAVENOUS | Status: AC
  Filled 2022-11-05: qty 10

## 2022-11-05 MED ORDER — LACTATED RINGERS IV SOLN: INTRAVENOUS | Status: DC

## 2022-11-05 MED ORDER — CHLORHEXIDINE GLUCONATE 0.12 % MT SOLN
15.0000 mL | Freq: Once | OROMUCOSAL | Status: AC
  Administered 2022-11-05: 15 mL via OROMUCOSAL

## 2022-11-05 MED ORDER — BUPIVACAINE HCL 0.25 % IJ SOLN
INTRAMUSCULAR | Status: AC
  Filled 2022-11-05: qty 1

## 2022-11-05 MED ORDER — SILVER NITRATE-POT NITRATE 75-25 % EX MISC
CUTANEOUS | Status: DC | PRN
  Administered 2022-11-05: 4

## 2022-11-05 MED ORDER — DEXAMETHASONE SODIUM PHOSPHATE 10 MG/ML IJ SOLN
INTRAMUSCULAR | Status: DC | PRN
  Administered 2022-11-05: 10 mg via INTRAVENOUS

## 2022-11-05 MED ORDER — FAMOTIDINE 20 MG PO TABS: 20.0000 mg | ORAL_TABLET | Freq: Once | ORAL | Status: DC

## 2022-11-05 MED ORDER — FENTANYL CITRATE (PF) 100 MCG/2ML IJ SOLN
INTRAMUSCULAR | Status: DC | PRN
  Administered 2022-11-05: 100 ug via INTRAVENOUS

## 2022-11-05 MED ORDER — MIDAZOLAM HCL 2 MG/2ML IJ SOLN
INTRAMUSCULAR | Status: AC
  Filled 2022-11-05: qty 2

## 2022-11-05 MED ORDER — DEXAMETHASONE SODIUM PHOSPHATE 10 MG/ML IJ SOLN
INTRAMUSCULAR | Status: AC
  Filled 2022-11-05: qty 1

## 2022-11-05 MED ORDER — ACETAMINOPHEN 500 MG PO TABS
1000.0000 mg | ORAL_TABLET | ORAL | Status: AC
  Administered 2022-11-05: 1000 mg via ORAL
  Filled 2022-11-05: qty 2

## 2022-11-05 MED ORDER — BUPIVACAINE HCL 0.25 % IJ SOLN
INTRAMUSCULAR | Status: DC | PRN
  Administered 2022-11-05: 30 mL

## 2022-11-05 MED ORDER — ORAL CARE MOUTH RINSE: 15.0000 mL | Freq: Once | OROMUCOSAL | Status: AC

## 2022-11-05 MED ORDER — KETOROLAC TROMETHAMINE 15 MG/ML IJ SOLN: 15.0000 mg | INTRAMUSCULAR | Status: DC

## 2022-11-05 MED ORDER — ONDANSETRON HCL 4 MG/2ML IJ SOLN
INTRAMUSCULAR | Status: AC
  Filled 2022-11-05: qty 2

## 2022-11-05 SURGICAL SUPPLY — 85 items
ADH SKN CLS APL DERMABOND .7 (GAUZE/BANDAGES/DRESSINGS) ×3
AGENT HMST KT MTR STRL THRMB (HEMOSTASIS)
APL ESCP 34 STRL LF DISP (HEMOSTASIS)
APL SKNCLS STERI-STRIP NONHPOA (GAUZE/BANDAGES/DRESSINGS) ×3
APL SRG 38 LTWT LNG FL B (MISCELLANEOUS) ×3
APPLICATOR ARISTA FLEXITIP XL (MISCELLANEOUS) ×2 IMPLANT
APPLICATOR SURGIFLO ENDO (HEMOSTASIS) IMPLANT
BAG LAPAROSCOPIC 12 15 PORT 16 (BASKET) IMPLANT
BAG RETRIEVAL 12/15 (BASKET)
BENZOIN TINCTURE PRP APPL 2/3 (GAUZE/BANDAGES/DRESSINGS) ×2 IMPLANT
BLADE SURG SZ10 CARB STEEL (BLADE) IMPLANT
COVER BACK TABLE 60X90IN (DRAPES) ×3 IMPLANT
COVER TIP SHEARS 8 DVNC (MISCELLANEOUS) ×3 IMPLANT
DERMABOND ADVANCED .7 DNX12 (GAUZE/BANDAGES/DRESSINGS) ×3 IMPLANT
DRAPE ARM DVNC X/XI (DISPOSABLE) ×12 IMPLANT
DRAPE COLUMN DVNC XI (DISPOSABLE) ×3 IMPLANT
DRAPE SHEET LG 3/4 BI-LAMINATE (DRAPES) ×3 IMPLANT
DRAPE SURG IRRIG POUCH 19X23 (DRAPES) ×3 IMPLANT
DRIVER NDL MEGA 8 DVNC XI (INSTRUMENTS) ×2 IMPLANT
DRIVER NDLE MEGA DVNC XI (INSTRUMENTS) ×6 IMPLANT
DRSG OPSITE POSTOP 4X6 (GAUZE/BANDAGES/DRESSINGS) IMPLANT
DRSG OPSITE POSTOP 4X8 (GAUZE/BANDAGES/DRESSINGS) IMPLANT
ELECT PENCIL ROCKER SW 15FT (MISCELLANEOUS) IMPLANT
ELECT REM PT RETURN 15FT ADLT (MISCELLANEOUS) ×3 IMPLANT
FORCEPS BPLR FENES DVNC XI (FORCEP) ×3 IMPLANT
FORCEPS PROGRASP DVNC XI (FORCEP) ×3 IMPLANT
GAUZE 4X4 16PLY ~~LOC~~+RFID DBL (SPONGE) ×3 IMPLANT
GLOVE BIO SURGEON STRL SZ 6 (GLOVE) ×12 IMPLANT
GLOVE BIO SURGEON STRL SZ 6.5 (GLOVE) ×3 IMPLANT
GLOVE BIOGEL PI IND STRL 6.5 (GLOVE) ×6 IMPLANT
GOWN STRL REUS W/ TWL LRG LVL3 (GOWN DISPOSABLE) ×12 IMPLANT
GOWN STRL REUS W/TWL LRG LVL3 (GOWN DISPOSABLE) ×12
GRASPER SUT TROCAR 14GX15 (MISCELLANEOUS) ×2 IMPLANT
HEMOSTAT ARISTA ABSORB 3G PWDR (HEMOSTASIS) ×2 IMPLANT
HOLDER FOLEY CATH W/STRAP (MISCELLANEOUS) IMPLANT
IRRIG SUCT STRYKERFLOW 2 WTIP (MISCELLANEOUS) ×3
IRRIGATION SUCT STRKRFLW 2 WTP (MISCELLANEOUS) ×3 IMPLANT
KIT PROCEDURE DVNC SI (MISCELLANEOUS) IMPLANT
KIT TURNOVER KIT A (KITS) IMPLANT
LIGASURE IMPACT 36 18CM CVD LR (INSTRUMENTS) IMPLANT
MANIPULATOR ADVINCU DEL 3.0 PL (MISCELLANEOUS) IMPLANT
MANIPULATOR ADVINCU DEL 3.5 PL (MISCELLANEOUS) IMPLANT
MANIPULATOR UTERINE 4.5 ZUMI (MISCELLANEOUS) IMPLANT
NDL HYPO 21X1.5 SAFETY (NEEDLE) ×1 IMPLANT
NDL INSUFFLATION 14GA 120MM (NEEDLE) IMPLANT
NDL SPNL 18GX3.5 QUINCKE PK (NEEDLE) IMPLANT
NEEDLE HYPO 21X1.5 SAFETY (NEEDLE) ×3 IMPLANT
NEEDLE INSUFFLATION 14GA 120MM (NEEDLE) IMPLANT
NEEDLE SPNL 18GX3.5 QUINCKE PK (NEEDLE) IMPLANT
OBTURATOR OPTICAL STND 8 DVNC (TROCAR) ×3
OBTURATOR OPTICALSTD 8 DVNC (TROCAR) ×3 IMPLANT
PACK ROBOT GYN CUSTOM WL (TRAY / TRAY PROCEDURE) ×3 IMPLANT
PAD ARMBOARD 7.5X6 YLW CONV (MISCELLANEOUS) ×3 IMPLANT
PAD POSITIONING PINK XL (MISCELLANEOUS) ×3 IMPLANT
PORT ACCESS TROCAR AIRSEAL 12 (TROCAR) ×2 IMPLANT
SCISSORS MNPLR CVD DVNC XI (INSTRUMENTS) ×3 IMPLANT
SCRUB CHG 4% DYNA-HEX 4OZ (MISCELLANEOUS) ×6 IMPLANT
SEAL UNIV 5-12 XI (MISCELLANEOUS) ×9 IMPLANT
SET TRI-LUMEN FLTR TB AIRSEAL (TUBING) ×3 IMPLANT
SPIKE FLUID TRANSFER (MISCELLANEOUS) ×3 IMPLANT
SPONGE T-LAP 18X18 ~~LOC~~+RFID (SPONGE) IMPLANT
STRIP CLOSURE SKIN 1/2X4 (GAUZE/BANDAGES/DRESSINGS) ×2 IMPLANT
SURGIFLO W/THROMBIN 8M KIT (HEMOSTASIS) IMPLANT
SUT MNCRL AB 4-0 PS2 18 (SUTURE) IMPLANT
SUT PDS AB 1 TP1 54 (SUTURE) IMPLANT
SUT VIC AB 0 CT1 27 (SUTURE)
SUT VIC AB 0 CT1 27XBRD ANTBC (SUTURE) IMPLANT
SUT VIC AB 2-0 CT1 27 (SUTURE)
SUT VIC AB 2-0 CT1 TAPERPNT 27 (SUTURE) IMPLANT
SUT VICRYL 0 27 CT2 27 ABS (SUTURE) IMPLANT
SUT VICRYL 4-0 PS2 18IN ABS (SUTURE) ×6 IMPLANT
SUT VLOC 180 0 9IN  GS21 (SUTURE)
SUT VLOC 180 0 9IN GS21 (SUTURE) IMPLANT
SYR 10ML LL (SYRINGE) IMPLANT
SYS BAG RETRIEVAL 10MM (BASKET)
SYS WOUND ALEXIS 18CM MED (MISCELLANEOUS)
SYSTEM BAG RETRIEVAL 10MM (BASKET) IMPLANT
SYSTEM WOUND ALEXIS 18CM MED (MISCELLANEOUS) IMPLANT
TOWEL OR NON WOVEN STRL DISP B (DISPOSABLE) IMPLANT
TRAP SPECIMEN MUCUS 40CC (MISCELLANEOUS) IMPLANT
TRAY FOLEY MTR SLVR 16FR STAT (SET/KITS/TRAYS/PACK) ×3 IMPLANT
TROCAR PORT AIRSEAL 5X120 (TROCAR) IMPLANT
UNDERPAD 30X36 HEAVY ABSORB (UNDERPADS AND DIAPERS) ×6 IMPLANT
WATER STERILE IRR 1000ML POUR (IV SOLUTION) ×3 IMPLANT
YANKAUER SUCT BULB TIP 10FT TU (MISCELLANEOUS) IMPLANT

## 2022-11-05 NOTE — Discharge Instructions (Addendum)
AFTER SURGERY INSTRUCTIONS   Return to work: 4-6 weeks if applicable   Activity: 1. Be up and out of the bed during the day.  Take a nap if needed.  You may walk up steps but be careful and use the hand rail.  Stair climbing will tire you more than you think, you may need to stop part way and rest.    2. No lifting or straining for 6 weeks over 10 pounds. No pushing, pulling, straining for 6 weeks.   3. No driving for around 1 week(s).  Do not drive if you are taking narcotic pain medicine and make sure that your reaction time has returned.    4. You can shower as soon as the next day after surgery. Shower daily.  Use your regular soap and water (not directly on the incision) and pat your incision(s) dry afterwards; don't rub.  No tub baths or submerging your body in water until cleared by your surgeon. If you have the soap that was given to you by pre-surgical testing that was used before surgery, you do not need to use it afterwards because this can irritate your incisions.    5. No sexual activity and nothing in the vagina for 6 weeks, 12 weeks (if you have a hysterectomy).   6. You may experience a small amount of clear drainage from your incisions, which is normal.  If the drainage persists, increases, or changes color please call the office.   7. Do not use creams, lotions, or ointments such as neosporin on your incisions after surgery until advised by your surgeon because they can cause removal of the dermabond glue on your incisions.     8. You may experience vaginal spotting after surgery or around the 6-8 week mark from surgery when the stitches at the top of the vagina begin to dissolve (if you have a hysterectomy).  The spotting is normal but if you experience heavy bleeding, call our office.   9. Take Tylenol or ibuprofen first for pain if you are able to take these medications and only use narcotic pain medication for severe pain not relieved by the Tylenol or Ibuprofen.  Monitor  your Tylenol intake to a max of 4,000 mg in a 24 hour period. You can alternate these medications after surgery.   Diet: 1. Low sodium Heart Healthy Diet is recommended but you are cleared to resume your normal (before surgery) diet after your procedure.   2. It is safe to use a laxative, such as Miralax or Colace, if you have difficulty moving your bowels. You have been prescribed Sennakot-S to take at bedtime every evening after surgery to keep bowel movements regular and to prevent constipation.     Wound Care: 1. Keep clean and dry.  Shower daily.   Reasons to call the Doctor: Fever - Oral temperature greater than 100.4 degrees Fahrenheit Foul-smelling vaginal discharge Difficulty urinating Nausea and vomiting Increased pain at the site of the incision that is unrelieved with pain medicine. Difficulty breathing with or without chest pain New calf pain especially if only on one side Sudden, continuing increased vaginal bleeding with or without clots.   Contacts: For questions or concerns you should contact:   Dr. Clide Cliff at [phone]   Warner Mccreedy, NP at [phone]   After Hours: call [phone] and have the GYN Oncologist paged/contacted (after 5 pm or on the weekends). You will speak with an after hours RN and let he or she know you  have had surgery.   Messages sent via mychart are for non-urgent matters and are not responded to after hours so for urgent needs, please call the after hours number.

## 2022-11-05 NOTE — Anesthesia Preprocedure Evaluation (Addendum)
Anesthesia Evaluation  Patient identified by MRN, date of birth, ID band Patient awake    Reviewed: Allergy & Precautions, NPO status , Patient's Chart, lab work & pertinent test results  Airway Mallampati: II  TM Distance: >3 FB Neck ROM: Full    Dental no notable dental hx. (+) Teeth Intact, Dental Advisory Given   Pulmonary Current Smoker and Patient abstained from smoking.   Pulmonary exam normal breath sounds clear to auscultation       Cardiovascular hypertension, Pt. on medications Normal cardiovascular exam Rhythm:Regular Rate:Normal     Neuro/Psych  Headaches PSYCHIATRIC DISORDERS Anxiety Depression       GI/Hepatic Neg liver ROS, PUD,GERD  ,,  Endo/Other  diabetesHypothyroidism  Morbid obesity (BMI 40)  Renal/GU negative Renal ROS  negative genitourinary   Musculoskeletal negative musculoskeletal ROS (+)    Abdominal   Peds  Hematology negative hematology ROS (+)   Anesthesia Other Findings   Reproductive/Obstetrics                             Anesthesia Physical Anesthesia Plan  ASA: 3  Anesthesia Plan: General   Post-op Pain Management: Tylenol PO (pre-op)*   Induction: Intravenous  PONV Risk Score and Plan: 2 and Midazolam, Dexamethasone and Ondansetron  Airway Management Planned: Oral ETT  Additional Equipment:   Intra-op Plan:   Post-operative Plan: Extubation in OR  Informed Consent: I have reviewed the patients History and Physical, chart, labs and discussed the procedure including the risks, benefits and alternatives for the proposed anesthesia with the patient or authorized representative who has indicated his/her understanding and acceptance.     Dental advisory given  Plan Discussed with: CRNA  Anesthesia Plan Comments: (2 IVs)       Anesthesia Quick Evaluation

## 2022-11-05 NOTE — Anesthesia Procedure Notes (Signed)
Procedure Name: Intubation Date/Time: 11/05/2022 1:01 PM  Performed by: Sindy Guadeloupe, CRNAPre-anesthesia Checklist: Patient identified, Emergency Drugs available, Suction available, Patient being monitored and Timeout performed Patient Re-evaluated:Patient Re-evaluated prior to induction Oxygen Delivery Method: Circle system utilized Preoxygenation: Pre-oxygenation with 100% oxygen Induction Type: IV induction Ventilation: Mask ventilation without difficulty Laryngoscope Size: Mac and 4 Grade View: Grade II Tube type: Oral Tube size: 7.0 mm Number of attempts: 1 Airway Equipment and Method: Stylet Placement Confirmation: ETT inserted through vocal cords under direct vision, positive ETCO2 and breath sounds checked- equal and bilateral Secured at: 21 cm Tube secured with: Tape Dental Injury: Teeth and Oropharynx as per pre-operative assessment  Comments: Christena Flake Paramedic assisted with intubation with supervision by CRNA and MDA.

## 2022-11-05 NOTE — Interval H&P Note (Signed)
History and Physical Interval Note:  11/05/2022 12:45 PM  Marissa Lane  has presented today for surgery, with the diagnosis of LEFT OVARIAN CYST.  The various methods of treatment have been discussed with the patient and family. After consideration of risks, benefits and other options for treatment, the patient has consented to  Procedure(s): XI ROBOTIC ASSISTED LAPAROSCOPIC LEFT OVARIAN CYSTECTOMY (Left) POSSBIEL XI ROBOTIC ASSISTED LEFT SALPINGO OOPHORECTOMY (Left) POSSIBLE XI ROBOTIC ASSISTED TOTAL HYSTERECTOMY (N/A) as a surgical intervention.  The patient's history has been reviewed, patient examined, no change in status, stable for surgery.  I have reviewed the patient's chart and labs.  Questions were answered to the patient's satisfaction.     Eurika Sandy

## 2022-11-05 NOTE — Anesthesia Postprocedure Evaluation (Signed)
Anesthesia Post Note  Patient: Nyima Vanacker  Procedure(s) Performed: XI ROBOTIC ASSISTED LAPAROSCOPIC LEFT OVARIAN CYSTECTOMY (Left: Abdomen)     Patient location during evaluation: PACU Anesthesia Type: General Level of consciousness: awake and alert Pain management: pain level controlled Vital Signs Assessment: post-procedure vital signs reviewed and stable Respiratory status: spontaneous breathing, nonlabored ventilation, respiratory function stable and patient connected to nasal cannula oxygen Cardiovascular status: blood pressure returned to baseline and stable Postop Assessment: no apparent nausea or vomiting Anesthetic complications: no  No notable events documented.  Last Vitals:  Vitals:   11/05/22 1630 11/05/22 1705  BP: (!) 151/96 (!) 140/83  Pulse: 93   Resp:    Temp:    SpO2: 100% 100%    Last Pain:  Vitals:   11/05/22 1622  TempSrc:   PainSc: 3                  Chasta Deshpande L Jevin Camino

## 2022-11-05 NOTE — Op Note (Signed)
GYNECOLOGIC ONCOLOGY OPERATIVE NOTE  Date of Service: 11/05/2022  Preoperative Diagnosis: Left ovarian cyst  Postoperative Diagnosis: Left simple ovarian cyst, ovarian nodule  Procedures: Robotic assisted laparoscopic left ovarian cystectomy and nodule excision  Surgeon: Clide Cliff, MD  Assistants: Antionette Char, MD and (an MD assistant was necessary for tissue manipulation, management of robotic instrumentation, retraction and positioning due to the complexity of the case and hospital policies)  Anesthesia: General  Estimated Blood Loss: 10 mL    Fluids: 500 ml, crystalloid  Urine Output: 100 ml, clear yellow  Findings: On entry to abdomen, normal upper abdominal survey with smooth diaphragm, liver, stomach and normal appearing omentum and bowel. Filmy adhesion of the omentum to the left upper quadrant adjacent to the prior LUQ port site. In the pelvis, normal uterus. Surgically absent right fallopian tube and ovary. Left ovary with one more dominant simple cyst about 4 cm and several small follicular cysts. Incidental cyst rupture during cystectomy. Left ovary adherent to the left ovarian fossa. Following mobilization of the ovary, a brownish/yellow nodule note on the ovary, excised. On frozen pathology, the nodule was predominantly necrotic tissue but no overt evidence of malignancy.  Specimens:  ID Type Source Tests Collected by Time Destination  1 : Left ovarian cyst wall Tissue PATH Gyn biopsy SURGICAL PATHOLOGY Clide Cliff, MD 11/05/2022 1400   2 : left ovarian nodule Tissue PATH Gyn biopsy SURGICAL PATHOLOGY Clide Cliff, MD 11/05/2022 1403   3 : Left ovarian cyst wall #2 Tissue PATH Gyn biopsy SURGICAL PATHOLOGY Clide Cliff, MD 11/05/2022 1411   A : Pelvic washings Body Fluid PATH Cytology Pelvic Washing CYTOLOGY - NON PAP Clide Cliff, MD 11/05/2022 1341     Complications:  None  Indications for Procedure: Marissa Lane is a 32 y.o. woman with a  history of malignant ovarian strumii s/p RSO now with a 6cm left ovarian cyst and prior concerns for small solid lesion of the ovary.  Prior to the procedure, all risks, benefits, and alternatives were discussed and informed surgical consent was signed.  Procedure: Patient was taken to the operating room where general anesthesia was achieved.  She was positioned in dorsal lithotomy and prepped and draped.  A foley catheter was inserted into the bladder.  The cervix was dilated and a hulka manipulator was inserted to the uterus.    A 12 mm incision was made in the left upper quadrant near Palmer's point.  The abdomen was entered with a 5 mm OptiView trocar under direct visualization.  The abdomen was insufflated, the patient placed in steep Trendelenburg, and additional trocars were placed as follows: an 8mm robotic trocar superior to the umbilicus, an 8 mm robotic trocar in the right abdomen, and one 8 mm robotic trocar in the left abdomen.  The left upper quadrant trocar was removed and replaced with a 12 mm airseal trocar.  All trocars were placed under direct visualization.  The bowels were moved into the upper abdomen.  The DaVinci robotic surgical system was brought to the patient's bedside and docked.  Pelvic washings were obtained. The left ovary was noted to be adherent to the left ovarian fossa. These adhesions were lysed with a combination of blunt and sharp dissection with scissors and short bursts of electrocautery. With the left ovary mobilized, an incision was made in the ovarian cortex overlying the dominant cyst. In doing so, the cyst was incidentally ruptured with clear serous cyst fluid drained. The cyst wall was then separated from the ovarian  cortex and stroma with blunt dissection and removed through the assistant port. A similar process was repeated to excision a second ovarian cyst. A third follicular cyst was then drained. At this time the ovary was inspected and a brownish yellow  nodule was noted on the surface of the ovary, approximately 1cm, near the location of where the ovary had been adherent to the ovarian fossa. This nodule was excised with cautery, removed through the assistant trocar and sent for frozen pathology.   The pelvis was irrigated. The cyst bed in the ovary was made hemostatic with electrocautery. Arista was then applied and all operative sites were found to be hemostatic.  All instruments were removed and the robot was taken from the patient's bedside. The fascia at the 12 mm incision was closed with 0 Vicryl with the assistance of a PMI device. The abdomen was desufflated and all ports were removed. The skin at all incisions was closed with 4-0 Vicryl to reapproximate the subcutaneous tissue and 4-0 monocryl in a subcuticular fashion followed by sterri strips to due patient surgical glue allergy.  Patient tolerated the procedure well. Sponge, lap, and instrument counts were correct.  No perioperative antibiotic prophylaxis was indicated for this procedure.  She was extubated and taken to the PACU in stable condition.  Clide Cliff, MD Gynecologic Oncology

## 2022-11-05 NOTE — Transfer of Care (Signed)
Immediate Anesthesia Transfer of Care Note  Patient: Nazarene Bunning  Procedure(s) Performed: XI ROBOTIC ASSISTED LAPAROSCOPIC LEFT OVARIAN CYSTECTOMY (Left: Abdomen)  Patient Location: PACU  Anesthesia Type:General  Level of Consciousness: awake, alert , and patient cooperative  Airway & Oxygen Therapy: Patient Spontanous Breathing and Patient connected to face mask oxygen  Post-op Assessment: Report given to RN, Post -op Vital signs reviewed and stable, and Patient moving all extremities X 4  Post vital signs: Reviewed and stable  Last Vitals:  Vitals Value Taken Time  BP 136/84 11/05/22 1503  Temp    Pulse 88 11/05/22 1504  Resp 24 11/05/22 1504  SpO2 100 % 11/05/22 1504  Vitals shown include unvalidated device data.  Last Pain:  Vitals:   11/05/22 1041  TempSrc: Oral  PainSc: 0-No pain      Patients Stated Pain Goal: 4 (11/05/22 1041)  Complications: No notable events documented.

## 2022-11-06 ENCOUNTER — Encounter (HOSPITAL_COMMUNITY): Payer: Self-pay | Admitting: Psychiatry

## 2022-11-06 ENCOUNTER — Telehealth: Payer: Self-pay

## 2022-11-06 NOTE — Telephone Encounter (Signed)
Attempted to reach patient to check in with her post-operatively. Unable to reach patient. Left message requesting return call.   ?

## 2022-11-06 NOTE — Telephone Encounter (Signed)
Pt returned call Spoke with Ms. Grider this morning. She states she is eating, drinking and urinating well. She has not had a BM yet but is passing gas. She is taking Colace and encouraged her to drink plenty of water. She denies fever or chills. Incisions are dry and intact. She rates her pain 5/10. Her pain is controlled with Tramadol. Advised she alternate Tylenol/Ibuprofen and save Tramadol for severe pain only, especially if she is constipated.   Instructed to call office with any fever, chills, purulent drainage, uncontrolled pain or any other questions or concerns. Patient verbalizes understanding.   Pt aware of post op appointments as well as the office number [phone] and after hours number [phone] to call if she has any questions or concerns

## 2022-11-07 LAB — CYTOLOGY - NON PAP

## 2022-11-07 LAB — SURGICAL PATHOLOGY

## 2022-11-08 ENCOUNTER — Emergency Department (HOSPITAL_COMMUNITY)
Admission: EM | Admit: 2022-11-08 | Discharge: 2022-11-08 | Disposition: A | Discharge status: Home / Self Care | Payer: Commercial Managed Care - HMO | Attending: Emergency Medicine | Admitting: Emergency Medicine

## 2022-11-08 ENCOUNTER — Other Ambulatory Visit: Payer: Self-pay

## 2022-11-08 ENCOUNTER — Encounter (HOSPITAL_COMMUNITY): Payer: Self-pay

## 2022-11-08 ENCOUNTER — Telehealth: Payer: Self-pay

## 2022-11-08 ENCOUNTER — Emergency Department (HOSPITAL_COMMUNITY): Payer: Commercial Managed Care - HMO

## 2022-11-08 DIAGNOSIS — R42 Dizziness and giddiness: Secondary | ICD-10-CM | POA: Insufficient documentation

## 2022-11-08 DIAGNOSIS — R110 Nausea: Secondary | ICD-10-CM | POA: Insufficient documentation

## 2022-11-08 DIAGNOSIS — R140 Abdominal distension (gaseous): Secondary | ICD-10-CM | POA: Diagnosis present

## 2022-11-08 LAB — URINALYSIS, ROUTINE W REFLEX MICROSCOPIC
Bacteria, UA: NONE SEEN
Bilirubin Urine: NEGATIVE
Glucose, UA: NEGATIVE mg/dL
Ketones, ur: NEGATIVE mg/dL
Nitrite: NEGATIVE
Protein, ur: NEGATIVE mg/dL
Specific Gravity, Urine: 1.018 (ref 1.005–1.030)
pH: 5 (ref 5.0–8.0)

## 2022-11-08 LAB — CBC WITH DIFFERENTIAL/PLATELET
Abs Immature Granulocytes: 0.05 10*3/uL (ref 0.00–0.07)
Basophils Absolute: 0.1 10*3/uL (ref 0.0–0.1)
Basophils Relative: 1 %
Eosinophils Absolute: 0.8 10*3/uL — ABNORMAL HIGH (ref 0.0–0.5)
Eosinophils Relative: 8 %
HCT: 38.2 % (ref 36.0–46.0)
Hemoglobin: 12.9 g/dL (ref 12.0–15.0)
Immature Granulocytes: 1 %
Lymphocytes Relative: 35 %
Lymphs Abs: 3.3 10*3/uL (ref 0.7–4.0)
MCH: 30.9 pg (ref 26.0–34.0)
MCHC: 33.8 g/dL (ref 30.0–36.0)
MCV: 91.4 fL (ref 80.0–100.0)
Monocytes Absolute: 0.8 10*3/uL (ref 0.1–1.0)
Monocytes Relative: 9 %
Neutro Abs: 4.5 10*3/uL (ref 1.7–7.7)
Neutrophils Relative %: 46 %
Platelets: 291 10*3/uL (ref 150–400)
RBC: 4.18 MIL/uL (ref 3.87–5.11)
RDW: 12 % (ref 11.5–15.5)
WBC: 9.5 10*3/uL (ref 4.0–10.5)
nRBC: 0 % (ref 0.0–0.2)

## 2022-11-08 LAB — COMPREHENSIVE METABOLIC PANEL
ALT: 49 U/L — ABNORMAL HIGH (ref 0–44)
AST: 31 U/L (ref 15–41)
Albumin: 3.7 g/dL (ref 3.5–5.0)
Alkaline Phosphatase: 59 U/L (ref 38–126)
Anion gap: 8 (ref 5–15)
BUN: 13 mg/dL (ref 6–20)
CO2: 25 mmol/L (ref 22–32)
Calcium: 8.6 mg/dL — ABNORMAL LOW (ref 8.9–10.3)
Chloride: 104 mmol/L (ref 98–111)
Creatinine, Ser: 0.61 mg/dL (ref 0.44–1.00)
GFR, Estimated: >60 mL/min (ref >60)
Glucose, Bld: 116 mg/dL — ABNORMAL HIGH (ref 70–99)
Potassium: 4.1 mmol/L (ref 3.5–5.1)
Sodium: 137 mmol/L (ref 135–145)
Total Bilirubin: 0.3 mg/dL (ref 0.3–1.2)
Total Protein: 7.2 g/dL (ref 6.5–8.1)

## 2022-11-08 LAB — LIPASE, BLOOD: Lipase: 33 U/L (ref 11–51)

## 2022-11-08 MED ORDER — ALUM & MAG HYDROXIDE-SIMETH 200-200-20 MG/5ML PO SUSP
30.0000 mL | Freq: Once | ORAL | Status: AC
  Administered 2022-11-08: 30 mL via ORAL
  Filled 2022-11-08: qty 30

## 2022-11-08 MED ORDER — LACTATED RINGERS IV BOLUS: 1000.0000 mL | Freq: Once | INTRAVENOUS | Status: DC

## 2022-11-08 MED ORDER — ALUM & MAG HYDROXIDE-SIMETH 400-400-40 MG/5ML PO SUSP: 15.0000 mL | Freq: Four times a day (QID) | ORAL | 0 refills | Status: DC | PRN

## 2022-11-08 MED ORDER — MECLIZINE HCL 25 MG PO TABS: 25.0000 mg | ORAL_TABLET | Freq: Three times a day (TID) | ORAL | 0 refills | Status: DC | PRN

## 2022-11-08 MED ORDER — LIDOCAINE VISCOUS HCL 2 % MT SOLN
15.0000 mL | Freq: Once | OROMUCOSAL | Status: AC
  Administered 2022-11-08: 15 mL via ORAL
  Filled 2022-11-08: qty 15

## 2022-11-08 NOTE — ED Triage Notes (Signed)
POV from home. Cc of abdominal discomfort and bloating after getting an ovarian cyst removed on the 16th. Has had 3 bm and is passing gas but still feels bloated. Pt says it isnt painful but 7/10 discomfort.

## 2022-11-08 NOTE — Telephone Encounter (Signed)
-----   Message from Doylene Bode, NP sent at 11/08/2022  8:31 AM EDT ----- She called the after hours service and went to ED. Please call and follow up with her

## 2022-11-08 NOTE — ED Provider Notes (Signed)
[place] EMERGENCY DEPARTMENT AT [facility] Provider Note   CSN: [id number] Arrival date & time: 11/08/22  0048     History  Chief Complaint  Patient presents with   Post-op Problem    Marissa Lane is a 33 y.o. female.  32 year old female with multiple past medical problems who presents the ER today with a few different symptoms.  Patient had a laparoscopic cystectomy off of her ovary back on the 16th.  She states that she has had some bloating since then but seems to be worse tonight.  Associated nausea and little bit of dizziness.  States that she thought it might be related to gas so she tried simethicone which did not seem to help much.  She continued to have bloating she thought me there might be a complication from the surgery so she came here for further evaluation.  States the wounds are not draining or painful.  She has been having regular bowel movements.        Home Medications Prior to Admission medications   Medication Sig Start Date End Date Taking? Authorizing Provider  alum & mag hydroxide-simeth (MAALOX PLUS) 400-400-40 MG/5ML suspension Take 15 mLs by mouth every 6 (six) hours as needed for indigestion. 11/08/22  Yes Donny Heffern, Barbara Cower, MD  meclizine (ANTIVERT) 25 MG tablet Take 1 tablet (25 mg total) by mouth 3 (three) times daily as needed for dizziness or nausea. 11/08/22  Yes Tashanda Fuhrer, Barbara Cower, MD  acetaminophen (TYLENOL) 500 MG tablet Take 500 mg by mouth every 6 (six) hours as needed for moderate pain.    [provider]  amLODipine (NORVASC) 5 MG tablet Take 1 tablet (5 mg total) by mouth daily. 04/15/22   Babs Sciara, MD  BIOTIN PO Take 1 tablet by mouth daily.    [provider]  cyclobenzaprine (FLEXERIL) 10 MG tablet Take 1 tablet (10 mg total) by mouth 2 (two) times daily as needed for muscle spasms. 10/23/22   Leath-Warren, Sadie Haber, NP  estradiol (CLIMARA) 0.1 mg/24hr patch Place 1 patch (0.1 mg total) onto the skin once a week.  10/21/22   Clide Cliff, MD  famotidine-calcium carbonate-magnesium hydroxide (PEPCID COMPLETE) 10-800-165 MG chewable tablet Chew 1 tablet by mouth daily as needed (acid reflux).    [provider]  ibuprofen (ADVIL) 200 MG tablet Take 200-400 mg by mouth every 6 (six) hours as needed for moderate pain.    [provider]  ibuprofen (ADVIL) 800 MG tablet Take 1 tablet (800 mg total) by mouth every 12 (twelve) hours as needed for moderate pain. For AFTER surgery only 10/21/22   Warner Mccreedy D, NP  levonorgestrel (MIRENA) 20 MCG/24HR IUD 1 each by Intrauterine route once.    [provider]  levothyroxine (SYNTHROID) 112 MCG tablet Take 224 mcg by mouth daily before breakfast.    [provider]  Multiple Vitamin (MULTIVITAMIN WITH MINERALS) TABS tablet Take 1 tablet by mouth daily.    [provider]  senna-docusate (SENOKOT-S) 8.6-50 MG tablet Take 2 tablets by mouth at bedtime. For AFTER surgery, do not take if having diarrhea 10/21/22   Warner Mccreedy D, NP  sodium chloride (OCEAN) 0.65 % SOLN nasal spray Place 1 spray into both nostrils as needed for congestion.    [provider]  traMADol (ULTRAM) 50 MG tablet Take 1 tablet (50 mg total) by mouth every 6 (six) hours as needed for severe pain. For AFTER surgery only, do not take and drive 4/0/98  Warner Mccreedy D, NP      Allergies    Other, Bupropion, Cymbalta [duloxetine hcl], Oxycodone, Silicone, Vicodin [hydrocodone-acetaminophen], and Zofran [ondansetron hcl]    Review of Systems   Review of Systems  Physical Exam Updated Vital Signs BP 125/78 (BP Location: Right Arm)   Pulse 79   Temp 98.4 F (36.9 C) (Oral)   Resp 17   Ht  (1.626 m)   Wt 105.2 kg   LMP 05/29/2022 (Approximate)   SpO2 98%   BMI 39.82 kg/m  Physical Exam Vitals and nursing note reviewed.  Constitutional:      Appearance: She is well-developed.  HENT:     Head: Normocephalic and atraumatic.      Mouth/Throat:     Mouth: Mucous membranes are moist.     Pharynx: Oropharynx is clear.  Eyes:     Pupils: Pupils are equal, round, and reactive to light.  Cardiovascular:     Rate and Rhythm: Normal rate and regular rhythm.  Pulmonary:     Effort: No respiratory distress.     Breath sounds: No stridor.  Abdominal:     General: There is no distension.     Tenderness: There is no abdominal tenderness. There is no guarding.  Musculoskeletal:     Cervical back: Normal range of motion.  Skin:    General: Skin is warm.  Neurological:     General: No focal deficit present.     Mental Status: She is alert.     ED Results / Procedures / Treatments   Labs (all labs ordered are listed, but only abnormal results are displayed) Labs Reviewed  CBC WITH DIFFERENTIAL/PLATELET - Abnormal; Notable for the following components:      Result Value   Eosinophils Absolute 0.8 (*)    All other components within normal limits  COMPREHENSIVE METABOLIC PANEL - Abnormal; Notable for the following components:   Glucose, Bld 116 (*)    Calcium 8.6 (*)    ALT 49 (*)    All other components within normal limits  URINALYSIS, ROUTINE W REFLEX MICROSCOPIC - Abnormal; Notable for the following components:   Hgb urine dipstick SMALL (*)    Leukocytes,Ua TRACE (*)    All other components within normal limits  LIPASE, BLOOD    EKG None  Radiology CT Renal Stone Study  Result Date: 11/08/2022 CLINICAL DATA:  32 year old female with history of abdominal discomfort and bloating after ovarian cyst removal on the sixteenth of the month. EXAM: CT ABDOMEN AND PELVIS WITHOUT CONTRAST TECHNIQUE: Multidetector CT imaging of the abdomen and pelvis was performed following the standard protocol without IV contrast. RADIATION DOSE REDUCTION: This exam was performed according to the departmental dose-optimization program which includes automated exposure control, adjustment of the mA and/or kV according to patient size  and/or use of iterative reconstruction technique. COMPARISON:  CT of the abdomen and pelvis 11/21/2020. FINDINGS: Lower chest: Unremarkable. Hepatobiliary: No definite suspicious cystic or solid hepatic lesions are confidently identified on today's noncontrast CT examination. Status post cholecystectomy. Pancreas: No definite pancreatic mass or peripancreatic fluid collections or inflammatory changes are noted on today's noncontrast CT examination. Spleen: Unremarkable. Adrenals/Urinary Tract: There are no abnormal calcifications within the collecting system of either kidney, along the course of either ureter, or within the lumen of the urinary bladder. No hydroureteronephrosis or perinephric stranding to suggest urinary tract obstruction at this time. The unenhanced appearance of the kidneys is unremarkable bilaterally. Unenhanced appearance of the urinary bladder is normal.  Bilateral adrenal glands are normal in appearance. Stomach/Bowel: Unenhanced appearance of the stomach is normal. There is no pathologic dilatation of small bowel or colon. Normal appendix. Vascular/Lymphatic: Atherosclerotic calcifications in the abdominal aorta. No lymphadenopathy noted in the abdomen or pelvis. Reproductive: IUD noted in the uterus. Uterus and ovaries are otherwise grossly unremarkable in appearance. Small amount of high attenuation material located immediately anterior to the cervix, with small amount of gas in the vagina, potentially postoperative or related to recent physical examination. Other: No significant volume of ascites.  No pneumoperitoneum. Musculoskeletal: Small amount of soft tissue stranding and gas in the periumbilical soft tissues, likely from recent laparoscopic access. Minimal soft tissue stranding also noted in the subcutaneous fat of the left upper quadrant of the abdomen. There are no aggressive appearing lytic or blastic lesions noted in the visualized portions of the skeleton. IMPRESSION: 1. No acute  findings are noted in the abdomen or pelvis to account for the patient's symptoms. Postoperative changes, as above. 2. Aortic atherosclerosis. 3. Additional incidental findings, as above. Electronically Signed   By: Trudie Reed M.D.   On: 11/08/2022 06:05    Procedures Procedures    Medications Ordered in ED Medications  lactated ringers bolus 1,000 mL (0 mLs Intravenous Hold 11/08/22 0308)  alum & mag hydroxide-simeth (MAALOX/MYLANTA) 200-200-20 MG/5ML suspension 30 mL (30 mLs Oral Given 11/08/22 0303)    And  lidocaine (XYLOCAINE) 2 % viscous mouth solution 15 mL (15 mLs Oral Given 11/08/22 0303)    ED Course/ Medical Decision Making/ A&P                             Medical Decision Making Amount and/or Complexity of Data Reviewed Labs: ordered. Radiology: ordered.  Risk OTC drugs. Prescription drug management.   Workup here including CT scan was negative.  No evidence of any type of complications from the surgery.  No persistent free air in the peritoneum.  Very well could just be indigestion/gastritis/ulcer.  No indication for further workup at this time.  Will follow-up with her surgeon if it continues to be a problem otherwise return here for new or worsening symptoms.   Final Clinical Impression(s) / ED Diagnoses Final diagnoses:  Bloating symptom    Rx / DC Orders ED Discharge Orders          Ordered    alum & mag hydroxide-simeth (MAALOX PLUS) 400-400-40 MG/5ML suspension  Every 6 hours PRN        11/08/22 0652    meclizine (ANTIVERT) 25 MG tablet  3 times daily PRN        11/08/22 0652              Jacobie Stamey, Barbara Cower, MD 11/08/22 0730

## 2022-11-08 NOTE — Telephone Encounter (Signed)
Ms.Fano s/p left ovarian cystectomy on 4/16  Pt went to the ER this morning with "feeling of being bloated"  She states she feels full, unable to eat but is drinking. passing gas and had 3 BM's yesterday. She feels full and uncomfortable. Rates pain 3/10 on pain scale, no fever/chills.   Advised small bland diet every few hours and to monitor and if S&S get worse go back to ER, However, I gave her the office number for her to check in during office hours and also number given for after hour triage. She voices an understanding. Post op appointment scheduled for 4/29 with Dr. Alvester Morin Dr.[place] notified

## 2022-11-11 ENCOUNTER — Telehealth: Payer: Self-pay

## 2022-11-11 NOTE — Transitions of Care (Post Inpatient/ED Visit) (Signed)
   11/11/2022  Name: Marissa Lane MRN: [id number] DOB: 13-Feb-1991  Today's TOC FU Call Status: Today's TOC FU Call Status:: Unsuccessul Call (1st Attempt) Unsuccessful Call (1st Attempt) Date: 11/11/22  Attempted to reach the patient regarding the most recent Inpatient/ED visit.  Follow Up Plan: Additional outreach attempts will be made to reach the patient to complete the Transitions of Care (Post Inpatient/ED visit) call.   Signature  Kandis Fantasia, LPN [facility] Health Advisor [facility] [employer] Are. We Are. [employer] # [phone]

## 2022-11-15 ENCOUNTER — Ambulatory Visit (INDEPENDENT_AMBULATORY_CARE_PROVIDER_SITE_OTHER): Payer: Commercial Managed Care - HMO | Admitting: Nurse Practitioner

## 2022-11-15 DIAGNOSIS — R5383 Other fatigue: Secondary | ICD-10-CM

## 2022-11-15 DIAGNOSIS — R7303 Prediabetes: Secondary | ICD-10-CM

## 2022-11-15 DIAGNOSIS — I10 Essential (primary) hypertension: Secondary | ICD-10-CM

## 2022-11-15 DIAGNOSIS — F902 Attention-deficit hyperactivity disorder, combined type: Secondary | ICD-10-CM

## 2022-11-15 DIAGNOSIS — R0683 Snoring: Secondary | ICD-10-CM | POA: Diagnosis not present

## 2022-11-15 DIAGNOSIS — E782 Mixed hyperlipidemia: Secondary | ICD-10-CM

## 2022-11-15 LAB — BASIC METABOLIC PANEL
BUN/Creatinine Ratio: 21 (ref 9–23)
BUN: 10 mg/dL (ref 6–20)
CO2: 18 mmol/L — ABNORMAL LOW (ref 20–29)
Calcium: 9.4 mg/dL (ref 8.7–10.2)
Chloride: 105 mmol/L (ref 96–106)
Creatinine, Ser: 0.48 mg/dL — ABNORMAL LOW (ref 0.57–1.00)
Glucose: 109 mg/dL — ABNORMAL HIGH (ref 70–99)
Potassium: 4.3 mmol/L (ref 3.5–5.2)
Sodium: 139 mmol/L (ref 134–144)
eGFR: 129 mL/min/{1.73_m2} (ref >59)

## 2022-11-15 LAB — LIPID PANEL
Chol/HDL Ratio: 6.4 ratio — ABNORMAL HIGH (ref 0.0–4.4)
Cholesterol, Total: 229 mg/dL — ABNORMAL HIGH (ref 100–199)
HDL: 36 mg/dL — ABNORMAL LOW (ref >39)
LDL Chol Calc (NIH): 152 mg/dL — ABNORMAL HIGH (ref 0–99)
Triglycerides: 223 mg/dL — ABNORMAL HIGH (ref 0–149)
VLDL Cholesterol Cal: 41 mg/dL — ABNORMAL HIGH (ref 5–40)

## 2022-11-15 LAB — MICROALBUMIN / CREATININE URINE RATIO
Creatinine, Urine: 140.1 mg/dL
Microalb/Creat Ratio: 7 mg/g creat (ref 0–29)
Microalbumin, Urine: 9.2 ug/mL

## 2022-11-15 MED ORDER — SEMAGLUTIDE-WEIGHT MANAGEMENT 0.25 MG/0.5ML ~~LOC~~ SOAJ: 0.2500 mg | SUBCUTANEOUS | 0 refills | Status: DC

## 2022-11-15 NOTE — Progress Notes (Unsigned)
   Subjective:    Patient ID: Marissa Lane, female    DOB: 06/11/1991, 32 y.o.   MRN: [id number]  HPI  Patient arrives to discuss recent labs and discuss weight loss options.  Review of Systems     Objective:   Physical Exam        Assessment & Plan:

## 2022-11-15 NOTE — Progress Notes (Unsigned)
Subjective:    Patient ID: Marissa Lane, female    DOB: 17-Jan-1991, 32 y.o.   MRN: [id number]  HPI  Patient arrives to discuss recent labs and discuss weight loss options. She discussed weight loss options on 07/30/22 with [name]. She spoke with her insurance, and she said she meets the criteria for [facility] for weight loss. She has tried to increase her activity, and tries to make healthier food choices over the past few months. She does have an active job, and stays busy taking care of her children. She has tried to eat in a calorie deficit in the past, but gained all her weight back when she had issues with her thyroid. She has tried Phentermine in the past, but it caused her blood pressure to go up, so she stopped taking it. She does take her blood pressure at home at it does range in the 120's systolic normally. Her husband does complain that he thinks she has sleep apnea because she does snore loudly. Reports daytime fatigue. 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 No personal history of family history of medullary thyroid cancer. No history of pancreatitis. She also did just have surgery, and reports abdominal bloating from that.   She also wanted [place] to look at her lab work. She does have a family history of cardiac issues.   Review of Systems  Constitutional:  Positive for fatigue. Negative for unexpected weight change.  Respiratory:  Negative for cough, chest tightness, shortness of breath and wheezing.   Cardiovascular:  Negative for chest pain and palpitations.  Skin:        Reports changes to two moles on her abdomen.   Psychiatric/Behavioral:  Positive for sleep disturbance.       Objective:   Physical Exam Constitutional:      General: She is not in acute distress. Cardiovascular:     Rate and Rhythm: Normal rate and regular rhythm.     Pulses: Normal pulses.     Heart sounds: Normal heart sounds. No murmur heard. Pulmonary:     Effort: Pulmonary effort is normal. No  respiratory distress.     Breath sounds: No wheezing.  Abdominal:     Comments: Positive for abdominal bloating.   Neurological:     Mental Status: She is alert.   Vitals:   11/15/22 0929  BP: 128/76  Height: 5\' 4"  (1.626 m)  Weight: 232 lb 12.8 oz (105.6 kg)  BMI (Calculated): 39.94    Results for orders placed or performed during the hospital encounter of 11/08/22  CBC with Differential  Result Value Ref Range   WBC 9.5 4.0 - 10.5 K/uL   RBC 4.18 3.87 - 5.11 MIL/uL   Hemoglobin 12.9 12.0 - 15.0 g/dL   HCT 40.9 81.1 - 91.4 %   MCV 91.4 80.0 - 100.0 fL   MCH 30.9 26.0 - 34.0 pg   MCHC 33.8 30.0 - 36.0 g/dL   RDW 78.2 95.6 - 21.3 %   Platelets 291 150 - 400 K/uL   nRBC 0.0 0.0 - 0.2 %   Neutrophils Relative % 46 %   Neutro Abs 4.5 1.7 - 7.7 K/uL   Lymphocytes Relative 35 %   Lymphs Abs 3.3 0.7 - 4.0 K/uL   Monocytes Relative 9 %   Monocytes Absolute 0.8 0.1 - 1.0 K/uL   Eosinophils Relative 8 %   Eosinophils Absolute 0.8 (H) 0.0 - 0.5 K/uL   Basophils Relative 1 %   Basophils Absolute 0.1  0.0 - 0.1 K/uL   Immature Granulocytes 1 %   Abs Immature Granulocytes 0.05 0.00 - 0.07 K/uL  Comprehensive metabolic panel  Result Value Ref Range   Sodium 137 135 - 145 mmol/L   Potassium 4.1 3.5 - 5.1 mmol/L   Chloride 104 98 - 111 mmol/L   CO2 25 22 - 32 mmol/L   Glucose, Bld 116 (H) 70 - 99 mg/dL   BUN 13 6 - 20 mg/dL   Creatinine, Ser 1.61 0.44 - 1.00 mg/dL   Calcium 8.6 (L) 8.9 - 10.3 mg/dL   Total Protein 7.2 6.5 - 8.1 g/dL   Albumin 3.7 3.5 - 5.0 g/dL   AST 31 15 - 41 U/L   ALT 49 (H) 0 - 44 U/L   Alkaline Phosphatase 59 38 - 126 U/L   Total Bilirubin 0.3 0.3 - 1.2 mg/dL   GFR, Estimated >09 >60 mL/min   Anion gap 8 5 - 15  Lipase, blood  Result Value Ref Range   Lipase 33 11 - 51 U/L  Urinalysis, Routine w reflex microscopic -Urine, Clean Catch  Result Value Ref Range   Color, Urine YELLOW YELLOW   APPearance CLEAR CLEAR   Specific Gravity, Urine 1.018 1.005 -  1.030   pH 5.0 5.0 - 8.0   Glucose, UA NEGATIVE NEGATIVE mg/dL   Hgb urine dipstick SMALL (A) NEGATIVE   Bilirubin Urine NEGATIVE NEGATIVE   Ketones, ur NEGATIVE NEGATIVE mg/dL   Protein, ur NEGATIVE NEGATIVE mg/dL   Nitrite NEGATIVE NEGATIVE   Leukocytes,Ua TRACE (A) NEGATIVE   RBC / HPF 0-5 0 - 5 RBC/hpf   WBC, UA 0-5 0 - 5 WBC/hpf   Bacteria, UA NONE SEEN NONE SEEN   Squamous Epithelial / HPF 0-5 0 - 5 /HPF   Mucus PRESENT    [place] Questionnaire was positive for sleep apnea.     Assessment & Plan:  1. Prediabetes  2. Morbid obesity (HCC) Meds ordered this encounter  Medications   Semaglutide-Weight Management 0.25 MG/0.5ML SOAJ    Sig: Inject 0.25 mg into the skin once a week.    Dispense:  2 mL    Refill:  0    Order Specific Question:   Supervising Provider    Answer:   Babs Sciara [phone]   -Educated patient to continue exercising as tolerated and eating a healthy diet.  -Educated patient about side effects with medication.   3. Snoring - Ambulatory referral to Sleep Studies  4. Fatigue, unspecified type - Ambulatory referral to Sleep Studies  5. Chronic hypertension -Patient taking Amlodipine. -Educated patient to continue checking blood pressures at home.   6. Attention deficit hyperactivity disorder (ADHD), combined type  7. Mixed Hyperlipidemia -Patient starting Wegovy within the next month. Going to try lifestyle factors along with weight loss, and recheck lipid panel in 4 months.  Return in about 4 months (around 03/17/2023).

## 2022-11-16 ENCOUNTER — Encounter: Payer: Self-pay | Admitting: Nurse Practitioner

## 2022-11-18 ENCOUNTER — Inpatient Hospital Stay (HOSPITAL_BASED_OUTPATIENT_CLINIC_OR_DEPARTMENT_OTHER): Payer: Commercial Managed Care - HMO | Admitting: Psychiatry

## 2022-11-18 VITALS — BP 152/92 | HR 110 | Temp 99.5°F | Wt 234.4 lb

## 2022-11-18 DIAGNOSIS — N83202 Unspecified ovarian cyst, left side: Secondary | ICD-10-CM

## 2022-11-18 DIAGNOSIS — Z7189 Other specified counseling: Secondary | ICD-10-CM

## 2022-11-18 DIAGNOSIS — N83292 Other ovarian cyst, left side: Secondary | ICD-10-CM | POA: Diagnosis not present

## 2022-11-18 DIAGNOSIS — C561 Malignant neoplasm of right ovary: Secondary | ICD-10-CM

## 2022-11-18 NOTE — Progress Notes (Unsigned)
Gynecologic Oncology Return Clinic Visit  Date of Service: 11/18/2022 Referring Provider: Myna Hidalgo, DO   Assessment & Plan: Marissa Lane is a 32 y.o. 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 Now 2 weeks s/p RA-laparoscopic left ovarian cystectomy and nodule excision on 11/05/22.  Postop: - Pt recovering well from surgery and healing appropriately postoperatively - Intraoperative findings and pathology results reviewed. - Ongoing postoperative expectations and precautions reviewed. Continue with no lifting >10lbs through 6 weeks postoperatively - Pt works ***. Okay to return to work at [employer] - Given that uterus is in situ, pt advised that she should continue with pap smear screening per routine until age 56 if she continues with negative/low grade paps.  - Reviewed that after 12 months without menstrual cycles, she should not have any spotting or bleeding.  If this were to occur, she should be evaluated for postmenopausal bleeding.  ***  ***VTE Prophylaxis: - Khorana score = ***  RTC ***.  Clide Cliff, MD Gynecologic Oncology   Medical Decision Making I personally spent  TOTAL *** minutes face-to-face and non-face-to-face in the care of this patient, which includes all pre, intra, and post visit time on the date of service.  *** minutes spent reviewing records prior to the visit *** Minutes in patient contact      *** minutes in other billable services *** minutes charting , conferring with consultants etc.   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 OVARIAN CYST, LEFT, #1, EXCISION:  -  Mature teratoma  -  See comment   C. OVARIAN CYST, LEFT, #2, EXCISION:  -  Mature teratoma  -  See comment    03/23/2021 Surgery    SURGICAL PATHOLOGY  CASE: [id number]  1. Total thyroidectomy  2. Excision of soft tissue tumor right posterior shoulder, 16 x 10 x 5 cm, subcutaneous   FINAL MICROSCOPIC DIAGNOSIS:   A. LIPOMA, RIGHT POSTERIOR SHOULDER, EXCISION:  -  Mature adipose tissue consistent with lipoma   B. THYROID, TOTAL, THYROIDECTOMY:  -  Benign thyroid  -  No carcinoma identified      Interval History: Pt reports that she is recovering well from surgery. She is not needing anything for pain. She is eating and drinking well. She is voiding without issue and having regular bowel movements.   Still having some bloated gassy feel Bm every day Not taking anything anymore for that ***  Sterri strips - itch blister, helped with steroid cream Tegaderm has tolerated in past  Planning wegovy   Past Medical/Surgical History: 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 55   GERD (gastroesophageal reflux disease)    Gestational diabetes    H/O urinary frequency 06/25/2013   Hypothyroidism    Irregular intermenstrual bleeding  10/12/2013   Lipoma    right scapula   MRSA infection    Obesity    Ovarian cancer (HCC)    Right   Palpitations    PCOS (polycystic ovarian syndrome)    Postpartum depression    Tobacco abuse     Past Surgical History:  Procedure Laterality Date   CHOLECYSTECTOMY  03/06/2012   Procedure: LAPAROSCOPIC CHOLECYSTECTOMY;  Surgeon: Fabio Bering, MD;  Location: [facility];  Service: General;  Laterality:  N/A;   ESOPHAGOGASTRODUODENOSCOPY N/A 09/09/2012   Procedure: ESOPHAGOGASTRODUODENOSCOPY (EGD);  Surgeon: Malissa Hippo, MD;  Location: [facility];  Service: Endoscopy;  Laterality: N/A;  340   MASS EXCISION Right 03/23/2021   Procedure: EXCISION SOFT TISSUE MASS ON RIGHT SHOULDER;  Surgeon: Darnell Level, MD;  Location: [facility];  Service: General;  Laterality: Right;   MOUTH SURGERY     ROBOTIC ASSISTED LAPAROSCOPIC OVARIAN CYSTECTOMY Left 01/02/2021   Procedure: XI ROBOTIC ASSISTED LEFT LAPAROSCOPIC OVARIAN CYSTECTOMY;  Surgeon: Adolphus Birchwood, MD;  Location: [facility];  Service: Gynecology;  Laterality: Left;   ROBOTIC ASSISTED LAPAROSCOPIC OVARIAN CYSTECTOMY Left 11/05/2022   Procedure: XI ROBOTIC ASSISTED LAPAROSCOPIC LEFT OVARIAN CYSTECTOMY;  Surgeon: Clide Cliff, MD;  Location: [facility];  Service: Gynecology;  Laterality: Left;   ROBOTIC ASSISTED SALPINGO OOPHERECTOMY Right 01/02/2021   Procedure: XI ROBOTIC ASSISTED RIGHT SALPINGO OOPHORECTOMY;  Surgeon: Adolphus Birchwood, MD;  Location: [facility];  Service: Gynecology;  Laterality: Right;   SKIN GRAFT  2009   gum graft   THYROIDECTOMY N/A 03/23/2021   Procedure: TOTAL THYROIDECTOMY;  Surgeon: Darnell Level, MD;  Location: [facility];  Service: General;  Laterality: N/A;   TONSILLECTOMY      Family History  Adopted: Yes  Problem Relation Age of Onset   Congestive Heart Failure Mother    Irritable bowel syndrome Mother    Ulcers Mother    Anemia Mother    Drug abuse Mother    Hypertension Brother    Colon cancer Maternal Grandmother 73   Anxiety disorder Maternal Grandmother    Heart disease Maternal Grandfather 33       MI    Social History   Socioeconomic History   Marital status: Married    Spouse name: Stepfanie Yott   Number of children: 3   Years of education: Not on file   Highest education level: Associate degree: occupational, Scientist, product/process development, or vocational program  Occupational History   Occupation: LPN  Tobacco Use   Smoking status:  Every Day    Packs/day: 1.00    Years: 10.00    Additional pack years: 0.00    Total pack years: 10.00    Types: Cigarettes   Smokeless tobacco: Never  Vaping Use   Vaping Use: Every day  Substance and Sexual Activity   Alcohol use: No   Drug use: No   Sexual activity: Yes    Birth control/protection: I.U.D.  Other Topics Concern   Not on file  Social History Narrative   Not on file   Social Determinants of Health   Financial Resource Strain: Low Risk  (03/28/2022)   Overall Financial Resource Strain (CARDIA)    Difficulty of Paying Living Expenses: Not very hard  Food Insecurity: No Food Insecurity (03/28/2022)   Hunger Vital Sign    Worried About Running Out of Food in the Last Year: Never true    Ran Out of Food in the Last Year: Never true  Transportation Needs: No Transportation Needs (08/21/2020)  PRAPARE - Administrator, Civil Service (Medical): No    Lack of Transportation (Non-Medical): No  Physical Activity: Inactive (03/28/2022)   Exercise Vital Sign    Days of Exercise per Week: 0 days    Minutes of Exercise per Session: 0 min  Stress: No Stress Concern Present (03/28/2022)   [employer] of Occupational Health - Occupational Stress Questionnaire    Feeling of Stress : Only a little  Social Connections: Moderately Integrated (03/28/2022)   Social Connection and Isolation Panel [NHANES]    Frequency of Communication with Friends and Family: More than three times a week    Frequency of Social Gatherings with Friends and Family: Once a week    Attends Religious Services: 1 to 4 times per year    Active Member of [employer] or Organizations: No    Attends Banker Meetings: Never    Marital Status: Married    Current Medications:  Current Outpatient Medications:    acetaminophen (TYLENOL) 500 MG tablet, Take 500 mg by mouth every 6 (six) hours as needed for moderate pain., Disp: , Rfl:    amLODipine (NORVASC) 5 MG tablet, Take 1 tablet (5 mg  total) by mouth daily., Disp: 30 tablet, Rfl: 4   BIOTIN PO, Take 1 tablet by mouth daily., Disp: , Rfl:    famotidine-calcium carbonate-magnesium hydroxide (PEPCID COMPLETE) 10-800-165 MG chewable tablet, Chew 1 tablet by mouth daily as needed (acid reflux)., Disp: , Rfl:    ibuprofen (ADVIL) 200 MG tablet, Take 200-400 mg by mouth every 6 (six) hours as needed for moderate pain., Disp: , Rfl:    levonorgestrel (MIRENA) 20 MCG/24HR IUD, 1 each by Intrauterine route once., Disp: , Rfl:    levothyroxine (SYNTHROID) 112 MCG tablet, Take 224 mcg by mouth daily before breakfast., Disp: , Rfl:    Multiple Vitamin (MULTIVITAMIN WITH MINERALS) TABS tablet, Take 1 tablet by mouth daily., Disp: , Rfl:    Semaglutide-Weight Management 0.25 MG/0.5ML SOAJ, Inject 0.25 mg into the skin once a week., Disp: 2 mL, Rfl: 0   senna-docusate (SENOKOT-S) 8.6-50 MG tablet, Take 2 tablets by mouth at bedtime. For AFTER surgery, do not take if having diarrhea, Disp: 30 tablet, Rfl: 0   sodium chloride (OCEAN) 0.65 % SOLN nasal spray, Place 1 spray into both nostrils as needed for congestion., Disp: , Rfl:   Review of Symptoms: Complete 10-system review is negative except ***as above in Interval History.  Physical Exam: LMP 05/29/2022 (Approximate)  General: ***Alert, oriented, no acute distress. HEENT: ***Normocephalic, atraumatic. Neck symmetric without masses. Sclera anicteric.  Chest: ***Normal work of breathing. ***Clear to auscultation bilaterally.   Cardiovascular: ***Regular rate and rhythm, no murmurs. Abdomen: ***Soft, nontender.  Normoactive bowel sounds.  No masses appreciated.  ***Well-healing incision***s. Extremities: ***Grossly normal range of motion.  Warm, well perfused.  No edema bilaterally. Skin: ***No rashes or lesions noted. GU: Normal appearing external genitalia without erythema, excoriation, or lesions.  Speculum exam reveals ***.  Bimanual exam reveals ***. Exam chaperoned by  ***  Laboratory & Radiologic Studies: ***

## 2022-11-18 NOTE — Patient Instructions (Signed)
It was a pleasure to see you in clinic today. - Okay to reintroduce normal activity. Restrictions are lifted. - Okay to return to routine Ob/Gyn care.  Thank you very much for allowing me to provide care for you today.  I appreciate your confidence in choosing our Gynecologic Oncology team at [facility].  If you have any questions about your visit today please call our office or send [place] a MyChart message and we will get back to you as soon as possible.

## 2022-11-20 ENCOUNTER — Telehealth: Payer: Self-pay

## 2022-11-20 NOTE — Telephone Encounter (Signed)
[employer] management services forms faxed and confirmed.

## 2022-11-21 ENCOUNTER — Encounter: Payer: Self-pay | Admitting: Psychiatry

## 2022-11-27 ENCOUNTER — Other Ambulatory Visit: Payer: Self-pay | Admitting: Family Medicine

## 2022-11-29 ENCOUNTER — Ambulatory Visit: Payer: 59 | Admitting: Family Medicine

## 2022-12-02 ENCOUNTER — Encounter: Payer: Self-pay | Admitting: Nurse Practitioner

## 2022-12-15 ENCOUNTER — Emergency Department (HOSPITAL_COMMUNITY)
Admission: EM | Admit: 2022-12-15 | Discharge: 2022-12-15 | Disposition: A | Discharge status: Home / Self Care | Payer: BC Managed Care – PPO | Attending: Emergency Medicine | Admitting: Emergency Medicine

## 2022-12-15 ENCOUNTER — Other Ambulatory Visit: Payer: Self-pay

## 2022-12-15 ENCOUNTER — Encounter (HOSPITAL_COMMUNITY): Payer: Self-pay | Admitting: Emergency Medicine

## 2022-12-15 DIAGNOSIS — I10 Essential (primary) hypertension: Secondary | ICD-10-CM | POA: Diagnosis not present

## 2022-12-15 DIAGNOSIS — R55 Syncope and collapse: Secondary | ICD-10-CM | POA: Insufficient documentation

## 2022-12-15 DIAGNOSIS — R7309 Other abnormal glucose: Secondary | ICD-10-CM | POA: Insufficient documentation

## 2022-12-15 DIAGNOSIS — R42 Dizziness and giddiness: Secondary | ICD-10-CM

## 2022-12-15 DIAGNOSIS — Z8543 Personal history of malignant neoplasm of ovary: Secondary | ICD-10-CM | POA: Diagnosis not present

## 2022-12-15 LAB — BASIC METABOLIC PANEL
Anion gap: 10 (ref 5–15)
BUN: 11 mg/dL (ref 6–20)
CO2: 22 mmol/L (ref 22–32)
Calcium: 9 mg/dL (ref 8.9–10.3)
Chloride: 105 mmol/L (ref 98–111)
Creatinine, Ser: 0.55 mg/dL (ref 0.44–1.00)
GFR, Estimated: >60 mL/min (ref >60)
Glucose, Bld: 129 mg/dL — ABNORMAL HIGH (ref 70–99)
Potassium: 3.7 mmol/L (ref 3.5–5.1)
Sodium: 137 mmol/L (ref 135–145)

## 2022-12-15 LAB — CBC
HCT: 38.4 % (ref 36.0–46.0)
Hemoglobin: 13.5 g/dL (ref 12.0–15.0)
MCH: 30.9 pg (ref 26.0–34.0)
MCHC: 35.2 g/dL (ref 30.0–36.0)
MCV: 87.9 fL (ref 80.0–100.0)
Platelets: 286 10*3/uL (ref 150–400)
RBC: 4.37 MIL/uL (ref 3.87–5.11)
RDW: 11.7 % (ref 11.5–15.5)
WBC: 9 10*3/uL (ref 4.0–10.5)
nRBC: 0 % (ref 0.0–0.2)

## 2022-12-15 LAB — URINALYSIS, ROUTINE W REFLEX MICROSCOPIC
Bacteria, UA: NONE SEEN
Bilirubin Urine: NEGATIVE
Glucose, UA: NEGATIVE mg/dL
Ketones, ur: NEGATIVE mg/dL
Leukocytes,Ua: NEGATIVE
Nitrite: NEGATIVE
Protein, ur: NEGATIVE mg/dL
Specific Gravity, Urine: 1.012 (ref 1.005–1.030)
pH: 7 (ref 5.0–8.0)

## 2022-12-15 LAB — PREGNANCY, URINE: Preg Test, Ur: NEGATIVE

## 2022-12-15 MED ORDER — MECLIZINE HCL 25 MG PO TABS: 25.0000 mg | ORAL_TABLET | Freq: Three times a day (TID) | ORAL | 0 refills | Status: DC | PRN

## 2022-12-15 MED ORDER — MECLIZINE HCL 12.5 MG PO TABS
25.0000 mg | ORAL_TABLET | Freq: Once | ORAL | Status: AC
  Administered 2022-12-15: 25 mg via ORAL
  Filled 2022-12-15: qty 2

## 2022-12-15 NOTE — ED Notes (Signed)
Pt alert, oriented, and ambulatory with steady gait upon DC. She is aware of meclizine script at [facility] and to follow up with Cardiology. Kellogg RN

## 2022-12-15 NOTE — ED Triage Notes (Signed)
Pt c/o feeling as if she was going to pass out today and has been dizzy. She started [facility] on Monday 12/09/22. She has not actually fainted. Marissa Lane Gerrianne Scale

## 2022-12-15 NOTE — ED Provider Notes (Signed)
[place] EMERGENCY DEPARTMENT AT [facility] Provider Note   CSN: [id number] Arrival date & time: 12/15/22  0127     History  Chief Complaint  Patient presents with   Near Syncope    Marissa Lane is a 32 y.o. female.  The history is provided by the patient.  Near Syncope  She has history of hypertension, polycystic ovarian syndrome, ovarian cancer, dysthymic disorder and comes in because of dizziness.  She has been having some intermittent dizziness over the last several weeks which she describes as feeling like she was on a boat that was moving even though she was not moving.  Tonight while out with friends at [employer], she started feeling like she was going to pass out.  She denies chest pain or palpitations.  She thought her blood sugar might be low so she drank some sweet tea.  She felt better after about 20 minutes and then went home.  At home, she had an episode and felt like the room was spinning.  This was followed with recurrence of the feeling like she was going to pass out.  This again improved after about 20 minutes.  At 1 point, she noted mild dyspnea.  She denies nausea or diaphoresis.  Of note, she was started on semaglutide with first dose being taken 5 days ago.   Home Medications Prior to Admission medications   Medication Sig Start Date End Date Taking? Authorizing Provider  acetaminophen (TYLENOL) 500 MG tablet Take 500 mg by mouth every 6 (six) hours as needed for moderate pain.    [provider]  amLODipine (NORVASC) 5 MG tablet Take 1 tablet by mouth once daily 12/02/22   Babs Sciara, MD  BIOTIN PO Take 1 tablet by mouth daily.    [provider]  famotidine-calcium carbonate-magnesium hydroxide (PEPCID COMPLETE) 10-800-165 MG chewable tablet Chew 1 tablet by mouth daily as needed (acid reflux).    [provider]  ibuprofen (ADVIL) 200 MG tablet Take 200-400 mg by mouth every 6 (six) hours as needed for moderate  pain.    [provider]  levonorgestrel (MIRENA) 20 MCG/24HR IUD 1 each by Intrauterine route once.    [provider]  levothyroxine (SYNTHROID) 112 MCG tablet Take 224 mcg by mouth daily before breakfast.    [provider]  Multiple Vitamin (MULTIVITAMIN WITH MINERALS) TABS tablet Take 1 tablet by mouth daily.    [provider]  Semaglutide-Weight Management 0.25 MG/0.5ML SOAJ Inject 0.25 mg into the skin once a week. 11/15/22   Campbell Riches, NP  senna-docusate (SENOKOT-S) 8.6-50 MG tablet Take 2 tablets by mouth at bedtime. For AFTER surgery, do not take if having diarrhea 10/21/22   Warner Mccreedy D, NP  sodium chloride (OCEAN) 0.65 % SOLN nasal spray Place 1 spray into both nostrils as needed for congestion.    [provider]      Allergies    Other, Bupropion, Cymbalta [duloxetine hcl], Oxycodone, Silicone, Vicodin [hydrocodone-acetaminophen], and Zofran [ondansetron hcl]    Review of Systems   Review of Systems  Cardiovascular:  Positive for near-syncope.  All other systems reviewed and are negative.   Physical Exam Updated Vital Signs BP 131/86   Pulse 89   Temp 98.6 F (37 C) (Oral)   Resp 19   Ht 5\' 4"  (1.626 m)   Wt 102.2 kg   SpO2 97%   BMI 38.69 kg/m  Physical Exam Vitals and nursing note reviewed.  32 year old female, resting comfortably and in no acute distress. Vital signs are normal. Oxygen saturation is 97%, which is normal. Head is normocephalic and atraumatic. PERRLA, EOMI. Oropharynx is clear. Neck is nontender and supple without adenopathy or JVD. Back is nontender and there is no CVA tenderness. Lungs are clear without rales, wheezes, or rhonchi. Chest is nontender. Heart has regular rate and rhythm without murmur. Abdomen is soft, flat, nontender. Extremities have no cyanosis or edema, full range of motion is present. Skin is warm and dry without rash. Neurologic: Mental status is normal, cranial  nerves are intact, moves all extremities equally.  Mild dizziness is reproduced by passive head movement.  ED Results / Procedures / Treatments   Labs (all labs ordered are listed, but only abnormal results are displayed) Labs Reviewed  BASIC METABOLIC PANEL - Abnormal; Notable for the following components:      Result Value   Glucose, Bld 129 (*)    All other components within normal limits  URINALYSIS, ROUTINE W REFLEX MICROSCOPIC - Abnormal; Notable for the following components:   APPearance HAZY (*)    Hgb urine dipstick SMALL (*)    All other components within normal limits  CBC  PREGNANCY, URINE   EKG ED ECG REPORT   Date: 12/15/2022  Rate: 87  Rhythm: normal sinus rhythm  QRS Axis: normal  Intervals: normal  ST/T Wave abnormalities: normal  Conduction Disutrbances:none  Narrative Interpretation: Normal ECG.  When compared with ECG of 03/27/2021, no significant changes are seen.  Old EKG Reviewed: unchanged  I have personally reviewed the EKG tracing and agree with the computerized printout as noted.  Procedures Procedures  Cardiac monitor shows normal sinus rhythm, per my interpretation.  Medications Ordered in ED Medications  meclizine (ANTIVERT) tablet 25 mg (25 mg Oral Given 12/15/22 0416)    ED Course/ Medical Decision Making/ A&P                             Medical Decision Making Amount and/or Complexity of Data Reviewed Labs: ordered.   Episodes of dizziness which seem to be separate entities.  She appears to be having some vertigo, but also an episode of near syncope.  It does not sound like she had an arrhythmia, does not seem typical for vasovagal syncope.  I have reviewed and interpreted her electrocardiogram, and my interpretation is normal ECG.  I have reviewed and interpreted her laboratory test, my interpretation is mild elevation of random glucose which has been present previously, normal CBC, normal urinalysis, negative pregnancy test.  I have  ordered orthostatic vital signs to be checked.  Orthostatic vital signs showed no significant change in heart rate or blood pressure.  In spite of this, patient did note some momentary lightheadedness when she stood up.  I am discharging her with a prescription for meclizine, ambulatory referral to cardiology for consideration for outpatient cardiac monitoring.  Final Clinical Impression(s) / ED Diagnoses Final diagnoses:  Near syncope  Vertigo  Elevated random blood glucose level    Rx / DC Orders ED Discharge Orders          Ordered    meclizine (ANTIVERT) 25 MG tablet  3 times daily PRN        12/15/22 0428    Ambulatory referral to Cardiology       Comments: If you have not heard from the Cardiology office within the next 72 hours please call  [phone].   12/15/22 0981              Dione Booze, MD 12/15/22 [id number]

## 2022-12-15 NOTE — ED Notes (Signed)
Pt aware of need for a urine sample. Marissa Lane Sanita Estrada RN 

## 2022-12-15 NOTE — Discharge Instructions (Signed)
Return if you have any new or concerning symptoms. 

## 2022-12-19 ENCOUNTER — Encounter: Payer: Self-pay | Admitting: Family Medicine

## 2022-12-19 ENCOUNTER — Ambulatory Visit: Payer: BC Managed Care – PPO | Admitting: Family Medicine

## 2022-12-19 VITALS — BP 118/88 | HR 97 | Ht 64.0 in | Wt 227.0 lb

## 2022-12-19 DIAGNOSIS — I10 Essential (primary) hypertension: Secondary | ICD-10-CM | POA: Diagnosis not present

## 2022-12-19 DIAGNOSIS — R7303 Prediabetes: Secondary | ICD-10-CM | POA: Diagnosis not present

## 2022-12-19 DIAGNOSIS — E0590 Thyrotoxicosis, unspecified without thyrotoxic crisis or storm: Secondary | ICD-10-CM | POA: Diagnosis not present

## 2022-12-19 MED ORDER — VALSARTAN 40 MG PO TABS: 40.0000 mg | ORAL_TABLET | Freq: Every day | ORAL | 3 refills | Status: DC

## 2022-12-19 NOTE — Progress Notes (Signed)
   Subjective:    Patient ID: Marissa Lane, female    DOB: 18-Oct-1990, 32 y.o.   MRN: [id number]  HPI Patient arrives today for ED follow up for dizziness.  Patient had several different spells recently having dizziness where she felt like the room was turning or that she was unsteady she did not feel like she was going to pass out no nausea vomiting or blurred vision.  No severe headache.  Blood pressure has been borderline.  She relates her energy levels doing okay.  She does state her dietary is generally pretty good but does relate some salt intake Patient does relate that she is taking in a compounded semaglutide that she is getting from a weight reduction clinic she did not relay the dosage  Review of Systems     Objective:   Physical Exam General-in no acute distress Eyes-no discharge Lungs-respiratory rate normal, CTA CV-no murmurs,RRR Extremities skin warm dry no edema Neuro grossly normal Behavior normal, alert  Orthostatics were negative no murmur heard Horizontal nystagmus noted on EOMI exam      Assessment & Plan:  1. Hyperthyroidism She is under the care of endocrinology they are suppressing her TSH to prevent any thyroid tumor issues - TSH + free T4  2. Prediabetes She does trying to eat healthy and stay active she is hoping to lose weight with her medicine  3. Primary hypertension Blood pressure not quite where we would like to see it would like to see diastolic less recommended adding valsartan and then patient will check blood pressure in approximately 10 days - Basic Metabolic Panel (7) - Magnesium  Mild inner ear component should gradually get better but if progressive symptoms or worse follow-up immediately  Follow-up within 4 months

## 2022-12-28 ENCOUNTER — Encounter (HOSPITAL_COMMUNITY): Payer: Self-pay | Admitting: Emergency Medicine

## 2022-12-28 ENCOUNTER — Other Ambulatory Visit: Payer: Self-pay

## 2022-12-28 ENCOUNTER — Emergency Department (HOSPITAL_COMMUNITY)
Admission: EM | Admit: 2022-12-28 | Discharge: 2022-12-28 | Disposition: A | Discharge status: Home / Self Care | Payer: BC Managed Care – PPO | Attending: Emergency Medicine | Admitting: Emergency Medicine

## 2022-12-28 DIAGNOSIS — R42 Dizziness and giddiness: Secondary | ICD-10-CM | POA: Diagnosis present

## 2022-12-28 LAB — BASIC METABOLIC PANEL
Anion gap: 9 (ref 5–15)
BUN: 11 mg/dL (ref 6–20)
CO2: 22 mmol/L (ref 22–32)
Calcium: 8.8 mg/dL — ABNORMAL LOW (ref 8.9–10.3)
Chloride: 104 mmol/L (ref 98–111)
Creatinine, Ser: 0.53 mg/dL (ref 0.44–1.00)
GFR, Estimated: >60 mL/min (ref >60)
Glucose, Bld: 105 mg/dL — ABNORMAL HIGH (ref 70–99)
Potassium: 3.7 mmol/L (ref 3.5–5.1)
Sodium: 135 mmol/L (ref 135–145)

## 2022-12-28 LAB — CBC WITH DIFFERENTIAL/PLATELET
Abs Immature Granulocytes: 0.01 10*3/uL (ref 0.00–0.07)
Basophils Absolute: 0 10*3/uL (ref 0.0–0.1)
Basophils Relative: 1 %
Eosinophils Absolute: 0.2 10*3/uL (ref 0.0–0.5)
Eosinophils Relative: 2 %
HCT: 41.6 % (ref 36.0–46.0)
Hemoglobin: 14.4 g/dL (ref 12.0–15.0)
Immature Granulocytes: 0 %
Lymphocytes Relative: 35 %
Lymphs Abs: 2.6 10*3/uL (ref 0.7–4.0)
MCH: 30.4 pg (ref 26.0–34.0)
MCHC: 34.6 g/dL (ref 30.0–36.0)
MCV: 87.9 fL (ref 80.0–100.0)
Monocytes Absolute: 0.5 10*3/uL (ref 0.1–1.0)
Monocytes Relative: 6 %
Neutro Abs: 4.1 10*3/uL (ref 1.7–7.7)
Neutrophils Relative %: 56 %
Platelets: 308 10*3/uL (ref 150–400)
RBC: 4.73 MIL/uL (ref 3.87–5.11)
RDW: 11.7 % (ref 11.5–15.5)
WBC: 7.4 10*3/uL (ref 4.0–10.5)
nRBC: 0 % (ref 0.0–0.2)

## 2022-12-28 NOTE — ED Provider Notes (Signed)
[place] EMERGENCY DEPARTMENT AT [facility] Provider Note   CSN: [id number] Arrival date & time: 12/28/22  1946     History  Chief Complaint  Patient presents with   Hypotension    Marissa Lane is a 32 y.o. female.  Patient has hypertension.  She felt little bit dizzy and her blood pressure was 100 systolic.  She recently started Diovan.  Patient has had problems with vertigo  The history is provided by the patient and medical records. No language interpreter was used.  Dizziness Quality:  Lightheadedness Severity:  Mild Onset quality:  Sudden Timing:  Intermittent Progression:  Resolved Chronicity:  New Relieved by:  Nothing Worsened by:  Nothing Ineffective treatments:  None tried Associated symptoms: no blood in stool, no chest pain, no diarrhea and no headaches        Home Medications Prior to Admission medications   Medication Sig Start Date End Date Taking? Authorizing Provider  acetaminophen (TYLENOL) 500 MG tablet Take 500 mg by mouth every 6 (six) hours as needed for moderate pain.    [provider]  amLODipine (NORVASC) 5 MG tablet Take 1 tablet by mouth once daily 12/02/22   Babs Sciara, MD  BIOTIN PO Take 1 tablet by mouth daily.    [provider]  famotidine-calcium carbonate-magnesium hydroxide (PEPCID COMPLETE) 10-800-165 MG chewable tablet Chew 1 tablet by mouth daily as needed (acid reflux).    [provider]  ibuprofen (ADVIL) 200 MG tablet Take 200-400 mg by mouth every 6 (six) hours as needed for moderate pain.    [provider]  levonorgestrel (MIRENA) 20 MCG/24HR IUD 1 each by Intrauterine route once.    [provider]  levothyroxine (SYNTHROID) 112 MCG tablet Take 224 mcg by mouth daily before breakfast.    [provider]  meclizine (ANTIVERT) 25 MG tablet Take 1 tablet (25 mg total) by mouth 3 (three) times daily as needed for dizziness. 12/15/22   Dione Booze, MD   Multiple Vitamin (MULTIVITAMIN WITH MINERALS) TABS tablet Take 1 tablet by mouth daily.    [provider]  Semaglutide-Weight Management 0.25 MG/0.5ML SOAJ Inject 0.25 mg into the skin once a week. Patient not taking: Reported on 12/19/2022 11/15/22   Campbell Riches, NP  senna-docusate (SENOKOT-S) 8.6-50 MG tablet Take 2 tablets by mouth at bedtime. For AFTER surgery, do not take if having diarrhea 10/21/22   Warner Mccreedy D, NP  sodium chloride (OCEAN) 0.65 % SOLN nasal spray Place 1 spray into both nostrils as needed for congestion.    [provider]  valsartan (DIOVAN) 40 MG tablet Take 1 tablet (40 mg total) by mouth daily. 12/19/22   Babs Sciara, MD      Allergies    Other, Bupropion, Cymbalta [duloxetine hcl], Oxycodone, Silicone, Vicodin [hydrocodone-acetaminophen], and Zofran [ondansetron hcl]    Review of Systems   Review of Systems  Constitutional:  Negative for appetite change and fatigue.  HENT:  Negative for congestion, ear discharge and sinus pressure.   Eyes:  Negative for discharge.  Respiratory:  Negative for cough.   Cardiovascular:  Negative for chest pain.  Gastrointestinal:  Negative for abdominal pain, blood in stool and diarrhea.  Genitourinary:  Negative for frequency and hematuria.  Musculoskeletal:  Negative for back pain.  Skin:  Negative for rash.  Neurological:  Positive for dizziness. Negative for seizures and headaches.  Psychiatric/Behavioral:  Negative for hallucinations.     Physical Exam Updated Vital Signs  BP 128/81 (BP Location: Right Arm)   Pulse 84   Temp 99.7 F (37.6 C) (Oral)   Resp 16   Ht 5\' 4"  (1.626 m)   Wt 100.7 kg   SpO2 99%   BMI 38.11 kg/m  Physical Exam Vitals and nursing note reviewed.  Constitutional:      Appearance: She is well-developed.  HENT:     Head: Normocephalic.     Nose: Nose normal.  Eyes:     General: No scleral icterus.    Conjunctiva/sclera: Conjunctivae normal.  Neck:      Thyroid: No thyromegaly.  Cardiovascular:     Rate and Rhythm: Normal rate and regular rhythm.     Heart sounds: No murmur heard.    No friction rub. No gallop.  Pulmonary:     Breath sounds: No stridor. No wheezing or rales.  Chest:     Chest wall: No tenderness.  Abdominal:     General: There is no distension.     Tenderness: There is no abdominal tenderness. There is no rebound.  Musculoskeletal:        General: Normal range of motion.     Cervical back: Neck supple.  Lymphadenopathy:     Cervical: No cervical adenopathy.  Skin:    Findings: No erythema or rash.  Neurological:     Mental Status: She is alert and oriented to person, place, and time.     Motor: No abnormal muscle tone.     Coordination: Coordination normal.  Psychiatric:        Behavior: Behavior normal.     ED Results / Procedures / Treatments   Labs (all labs ordered are listed, but only abnormal results are displayed) Labs Reviewed  BASIC METABOLIC PANEL - Abnormal; Notable for the following components:      Result Value   Glucose, Bld 105 (*)    Calcium 8.8 (*)    All other components within normal limits  CBC WITH DIFFERENTIAL/PLATELET    EKG None  Radiology No results found.  Procedures Procedures    Medications Ordered in ED Medications - No data to display  ED Course/ Medical Decision Making/ A&P                             Medical Decision Making Amount and/or Complexity of Data Reviewed Labs: ordered.   Patient with mild dizziness that is improved.  She will continue with her blood pressure medicines unless she has another episode of low blood pressure.  She is to follow-up with cards        Final Clinical Impression(s) / ED Diagnoses Final diagnoses:  Dizziness    Rx / DC Orders ED Discharge Orders     None         Bethann Berkshire, MD 12/31/22 1223

## 2022-12-28 NOTE — Discharge Instructions (Signed)
Drink plenty of fluids and stay hydrated.  If you have more episodes of dizziness you can stop taking the Diovan.  Keep your appointment with cardiology

## 2022-12-28 NOTE — ED Triage Notes (Signed)
Pt here for c/o hypotension. States she was started on a new BP medication a week ago (Valsartan 40mg ) and that she took it along with her other BP medication as scheduled around 1800. States approximately 15 mins later, she became dizzy and lightheaded. States she checked her Bp at that time and it read 100/51 for she she called the nurse line and was told to come to ER to be seen. States she was also recently diagnosed with vertigo 5 weeks ago.

## 2022-12-30 ENCOUNTER — Telehealth: Payer: Self-pay

## 2022-12-30 ENCOUNTER — Telehealth: Payer: Self-pay | Admitting: Family Medicine

## 2022-12-30 NOTE — Telephone Encounter (Signed)
Patient wanted you to know blood pressure was running a little low 6/9 was 99/53 at 9am and  117/63 6/10 at 10 am please advise

## 2022-12-30 NOTE — Transitions of Care (Post Inpatient/ED Visit) (Signed)
12/30/2022  Name: Marissa Lane MRN: [id number] DOB: December 27, 1990  Today's TOC FU Call Status: Today's TOC FU Call Status:: Successful TOC FU Call Competed TOC FU Call Complete Date: 12/30/22  Transition Care Management Follow-up Telephone Call Date of Discharge: 12/28/22 Dis[facility] Type of Discharge: Emergency Department Reason for ED Visit: Other: (dizziness) How have you been since you were released from the hospital?: Better Any questions or concerns?: No  Items Reviewed: Did you receive and understand the discharge instructions provided?: Yes Medications obtained,verified, and reconciled?: Yes (Medications Reviewed) Any new allergies since your discharge?: No Dietary orders reviewed?: Yes Do you have support at home?: Yes People in Home: spouse  Medications Reviewed Today: Medications Reviewed Today     Reviewed by Karena Addison, LPN (Licensed Practical Nurse) on 12/30/22 at 1546  Med List Status: <None>   Medication Order Taking? Sig Documenting Provider Last Dose Status Informant  acetaminophen (TYLENOL) 500 MG tablet [id number] No Take 500 mg by mouth every 6 (six) hours as needed for moderate pain. [provider] Taking Active Self  amLODipine (NORVASC) 5 MG tablet [id number] No Take 1 tablet by mouth once daily Babs Sciara, MD Taking Active   BIOTIN PO [id number] No Take 1 tablet by mouth daily. [provider] Taking Active Self           Med Note Sherrie Mustache, KYLE A   Fri Oct 25, 2022 10:58 AM) On hold for procedure   famotidine-calcium carbonate-magnesium hydroxide (PEPCID COMPLETE) 10-800-165 MG chewable tablet [id number] No Chew 1 tablet by mouth daily as needed (acid reflux). [provider] Taking Active Self  ibuprofen (ADVIL) 200 MG tablet [id number] No Take 200-400 mg by mouth every 6 (six) hours as needed for moderate pain. [provider] Taking Active Self  levonorgestrel (MIRENA) 20 MCG/24HR IUD  [id number] No 1 each by Intrauterine route once. [provider] Taking Active Self  levothyroxine (SYNTHROID) 112 MCG tablet [id number] No Take 224 mcg by mouth daily before breakfast. [provider] Taking Active Self  meclizine (ANTIVERT) 25 MG tablet [id number] No Take 1 tablet (25 mg total) by mouth 3 (three) times daily as needed for dizziness. Dione Booze, MD Taking Active   Multiple Vitamin (MULTIVITAMIN WITH MINERALS) TABS tablet [id number] No Take 1 tablet by mouth daily. [provider] Taking Active Self           Med Note Sherrie Mustache, KYLE A   Fri Oct 25, 2022 10:58 AM) On hold for procedure   Semaglutide-Weight Management 0.25 MG/0.5ML SOAJ [id number] No Inject 0.25 mg into the skin once a week.  Patient not taking: Reported on 12/19/2022   Campbell Riches, NP Not Taking Active   senna-docusate (SENOKOT-S) 8.6-50 MG tablet [id number]  Take 2 tablets by mouth at bedtime. For AFTER surgery, do not take if having diarrhea Warner Mccreedy D, NP  Active Self           Med Note Sherrie Mustache, [place] A   Fri Oct 25, 2022 10:58 AM) Hasn't started, post op med  sodium chloride (OCEAN) 0.65 % SOLN nasal spray [id number] No Place 1 spray into both nostrils as needed for congestion. [provider] Taking Active Self  valsartan (DIOVAN) 40 MG tablet [id number]  Take 1 tablet (40 mg total) by mouth daily. Babs Sciara, MD  Active             Home Care and Equipment/Supplies: Were Home Health Services Ordered?: NA Any new equipment  or medical supplies ordered?: NA  Functional Questionnaire: Do you need assistance with bathing/showering or dressing?: No Do you need assistance with meal preparation?: No Do you need assistance with eating?: No Do you have difficulty maintaining continence: No Do you need assistance with getting out of bed/getting out of a chair/moving?: No Do you have difficulty managing or taking your medications?: No  Follow up appointments  reviewed: PCP Follow-up appointment confirmed?: NA Specialist Hospital Follow-up appointment confirmed?: NA Do you need transportation to your follow-up appointment?: No Do you understand care options if your condition(s) worsen?: Yes-patient verbalized understanding    SIGNATURE Karena Addison, LPN [facility] Nurse Health Advisor Direct Dial [phone]

## 2022-12-30 NOTE — Telephone Encounter (Signed)
Patient wanted you to know blood pressure was running a little low 6/9 was 99/53 at 9am and  117/63 6/10 at 10 am please advise 

## 2022-12-31 ENCOUNTER — Other Ambulatory Visit: Payer: Self-pay

## 2022-12-31 MED ORDER — AMLODIPINE BESYLATE 2.5 MG PO TABS: 2.5000 mg | ORAL_TABLET | Freq: Every day | ORAL | 3 refills | Status: DC

## 2022-12-31 NOTE — Telephone Encounter (Signed)
Blood pressure is on the low side I recommend changing her blood pressure medicine Recommend amlodipine 2.5 mg-may have 30 tablets with 3 refills Recommend follow-up office visit somewhere in the course of the next several weeks Stop amlodipine 5 mg If blood pressure continues to run low on this newer dose we will stop the amlodipine Please inform patient

## 2022-12-31 NOTE — Telephone Encounter (Signed)
Patient informed per drs orders and recommendations.  

## 2023-01-01 ENCOUNTER — Encounter: Payer: Self-pay | Admitting: Family Medicine

## 2023-01-01 LAB — BASIC METABOLIC PANEL (7)
BUN/Creatinine Ratio: 14 (ref 9–23)
BUN: 10 mg/dL (ref 6–20)
CO2: 19 mmol/L — ABNORMAL LOW (ref 20–29)
Chloride: 104 mmol/L (ref 96–106)
Creatinine, Ser: 0.69 mg/dL (ref 0.57–1.00)
Glucose: 157 mg/dL — ABNORMAL HIGH (ref 70–99)
Potassium: 4.4 mmol/L (ref 3.5–5.2)
Sodium: 139 mmol/L (ref 134–144)
eGFR: 117 mL/min/{1.73_m2} (ref >59)

## 2023-01-01 LAB — TSH+FREE T4
Free T4: 2.4 ng/dL — ABNORMAL HIGH (ref 0.82–1.77)
TSH: 0.007 u[IU]/mL — ABNORMAL LOW (ref 0.450–4.500)

## 2023-01-01 LAB — MAGNESIUM: Magnesium: 2 mg/dL (ref 1.6–2.3)

## 2023-01-06 ENCOUNTER — Ambulatory Visit: Payer: BC Managed Care – PPO | Attending: Internal Medicine | Admitting: Internal Medicine

## 2023-01-06 ENCOUNTER — Ambulatory Visit (INDEPENDENT_AMBULATORY_CARE_PROVIDER_SITE_OTHER): Payer: BC Managed Care – PPO

## 2023-01-06 ENCOUNTER — Encounter: Payer: Self-pay | Admitting: Internal Medicine

## 2023-01-06 ENCOUNTER — Other Ambulatory Visit: Payer: Self-pay | Admitting: Internal Medicine

## 2023-01-06 ENCOUNTER — Telehealth: Payer: Self-pay | Admitting: Internal Medicine

## 2023-01-06 VITALS — Ht 64.0 in | Wt 224.2 lb

## 2023-01-06 DIAGNOSIS — R002 Palpitations: Secondary | ICD-10-CM | POA: Diagnosis not present

## 2023-01-06 DIAGNOSIS — R42 Dizziness and giddiness: Secondary | ICD-10-CM | POA: Diagnosis not present

## 2023-01-06 DIAGNOSIS — R55 Syncope and collapse: Secondary | ICD-10-CM | POA: Diagnosis not present

## 2023-01-06 NOTE — Patient Instructions (Signed)
Medication Instructions:  Your physician has recommended you make the following change in your medication:  Stop taking Valsartan  Labwork: None  Testing/Procedures: Your physician has requested that you have an echocardiogram. Echocardiography is a painless test that uses sound waves to create images of your heart. It provides your doctor with information about the size and shape of your heart and how well your heart's chambers and valves are working. This procedure takes approximately one hour. There are no restrictions for this procedure. Please do NOT wear cologne, perfume, aftershave, or lotions (deodorant is allowed). Please arrive 15 minutes prior to your appointment time.  Your physician has recommended that you wear a Zio monitor.   This monitor is a medical device that records the heart's electrical activity. Doctors most often use these monitors to diagnose arrhythmias. Arrhythmias are problems with the speed or rhythm of the heartbeat. The monitor is a small device applied to your chest. You can wear one while you do your normal daily activities. While wearing this monitor if you have any symptoms to push the button and record what you felt. Once you have worn this monitor for the period of time provider prescribed (for 14 days), you will return the monitor device in the postage paid box. Once it is returned they will download the data collected and provide [place] with a report which the provider will then review and we will call you with those results. Important tips:  Avoid showering during the first 48 hours of wearing the monitor. Avoid excessive sweating to help maximize wear time. Do not submerge the device, no hot tubs, and no swimming pools. Keep any lotions or oils away from the patch. After 48 hours you may shower with the patch on. Take brief showers with your back facing the shower head.  Do not remove patch once it has been placed because that will interrupt data and decrease  adhesive wear time. Push the button when you have any symptoms and write down what you were feeling. Once you have completed wearing your monitor, remove and place into box which has postage paid and place in your outgoing mailbox.  If for some reason you have misplaced your box then call our office and we can provide another box and/or mail it off for you.   Follow-Up: Your physician recommends that you schedule a follow-up appointment in: Pending Results  Any Other Special Instructions Will Be Listed Below (If Applicable).  If you need a refill on your cardiac medications before your next appointment, please call your pharmacy.

## 2023-01-06 NOTE — Telephone Encounter (Signed)
Checking percert on the following patient for testing scheduled at [facility].    ECHO - 02-19-2023  LONG TERM MONITOR (3-14 DAYS

## 2023-01-06 NOTE — Progress Notes (Signed)
Cardiology Office Note  Date: 01/06/2023   ID: Marissa Lane, DOB Dec 20, 1990, MRN [id number]  PCP:  Babs Sciara, MD  Cardiologist:  Marjo Bicker, MD Electrophysiologist:  None   Reason for Office Visit: Evaluation of presyncope at the request of Dr. Gerda Diss   History of Present Illness: Marissa Lane is a 32 y.o. female known to have HTN, hypothyroidism was referred to cardiology clinic for evaluation of presyncope.  Patient was having palpitations since last year and these palpitations last for almost 2 hours.  This started around the time she was on Adderall for her ADHD.  She was also recently diagnosed with severely depressed TSH, 0.007 which could be reason for her palpitations as well.  Her levothyroxine dose was decreased from 224 to 200 mcg.  She also had dizziness a few days before her body in 11/2022 when she was in sitting in a restaurant with a prodrome of feeling hot/flushing and was about to pass out but did not have any syncope.  Later that day in the evening, she again had dizziness lasting for a few minutes.  She has these dizziness episodes for quite some time, intermittently.  No true syncope.  She was placed on HTN medications after her delivery, initially was on amlodipine 5 mg which was decreased to 2.5 mg and valsartan 40 mg was added.  1 episode of dizziness felt like vertigo, meclizine was added but this made her feel drowsy and so stopped taking it.  No symptoms of chest pain, DOE.  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 67   GERD (gastroesophageal reflux disease)    Gestational diabetes    H/O urinary frequency 06/25/2013   Hypothyroidism    Irregular intermenstrual bleeding 10/12/2013   Lipoma    right scapula   MRSA infection    Obesity    Ovarian cancer (HCC)    Right   Palpitations     PCOS (polycystic ovarian syndrome)    Postpartum depression    Tobacco abuse     Past Surgical History:  Procedure Laterality Date   CHOLECYSTECTOMY  03/06/2012   Procedure: LAPAROSCOPIC CHOLECYSTECTOMY;  Surgeon: Fabio Bering, MD;  Location: [facility] ORS;  Service: General;  Laterality: N/A;   ESOPHAGOGASTRODUODENOSCOPY N/A 09/09/2012   Procedure: ESOPHAGOGASTRODUODENOSCOPY (EGD);  Surgeon: Malissa Hippo, MD;  Location: [facility] ENDO SUITE;  Service: Endoscopy;  Laterality: N/A;  340   MASS EXCISION Right 03/23/2021   Procedure: EXCISION SOFT TISSUE MASS ON RIGHT SHOULDER;  Surgeon: Darnell Level, MD;  Location: [facility] ORS;  Service: General;  Laterality: Right;   MOUTH SURGERY     ROBOTIC ASSISTED LAPAROSCOPIC OVARIAN CYSTECTOMY Left 01/02/2021   Procedure: XI ROBOTIC ASSISTED LEFT LAPAROSCOPIC OVARIAN CYSTECTOMY;  Surgeon: Adolphus Birchwood, MD;  Location: [facility] ORS;  Service: Gynecology;  Laterality: Left;   ROBOTIC ASSISTED LAPAROSCOPIC OVARIAN CYSTECTOMY Left 11/05/2022   Procedure: XI ROBOTIC ASSISTED LAPAROSCOPIC LEFT OVARIAN CYSTECTOMY;  Surgeon: Clide Cliff, MD;  Location: [facility];  Service: Gynecology;  Laterality: Left;   ROBOTIC ASSISTED SALPINGO OOPHERECTOMY Right 01/02/2021   Procedure: XI ROBOTIC ASSISTED RIGHT SALPINGO OOPHORECTOMY;  Surgeon: Adolphus Birchwood, MD;  Location: [facility];  Service: Gynecology;  Laterality: Right;   SKIN GRAFT  2009   gum graft   THYROIDECTOMY N/A 03/23/2021   Procedure: TOTAL THYROIDECTOMY;  Surgeon: Darnell Level, MD;  Location: [facility];  Service: General;  Laterality: N/A;   TONSILLECTOMY      Current Outpatient Medications  Medication Sig Dispense Refill   acetaminophen (TYLENOL) 500 MG tablet Take 500 mg by mouth every 6 (six) hours as needed for moderate pain.     amLODipine (NORVASC) 2.5 MG tablet Take 1 tablet (2.5 mg total) by mouth daily. 30 tablet 3   BIOTIN PO Take 1 tablet by mouth daily.     famotidine-calcium carbonate-magnesium hydroxide (PEPCID COMPLETE)  10-800-165 MG chewable tablet Chew 1 tablet by mouth daily as needed (acid reflux).     ibuprofen (ADVIL) 200 MG tablet Take 200-400 mg by mouth every 6 (six) hours as needed for moderate pain.     levonorgestrel (MIRENA) 20 MCG/24HR IUD 1 each by Intrauterine route once.     levothyroxine (SYNTHROID) 112 MCG tablet Take 224 mcg by mouth daily before breakfast.     meclizine (ANTIVERT) 25 MG tablet Take 1 tablet (25 mg total) by mouth 3 (three) times daily as needed for dizziness. 30 tablet 0   Multiple Vitamin (MULTIVITAMIN WITH MINERALS) TABS tablet Take 1 tablet by mouth daily.     senna-docusate (SENOKOT-S) 8.6-50 MG tablet Take 2 tablets by mouth at bedtime. For AFTER surgery, do not take if having diarrhea 30 tablet 0   sodium chloride (OCEAN) 0.65 % SOLN nasal spray Place 1 spray into both nostrils as needed for congestion.     valsartan (DIOVAN) 40 MG tablet Take 1 tablet (40 mg total) by mouth daily. 90 tablet 3   Semaglutide-Weight Management 0.25 MG/0.5ML SOAJ Inject 0.25 mg into the skin once a week. (Patient not taking: Reported on 12/19/2022) 2 mL 0   No current facility-administered medications for this visit.   Allergies:  Other, Bupropion, Cymbalta [duloxetine hcl], Oxycodone, Silicone, Vicodin [hydrocodone-acetaminophen], and Zofran [ondansetron hcl]   Social History: The patient  reports that she has been smoking cigarettes. She has a 10.00 pack-year smoking history. She has never used smokeless tobacco. She reports that she does not drink alcohol and does not use drugs.   Family History: The patient's family history includes Anemia in her mother; Anxiety disorder in her maternal grandmother; Colon cancer (age of onset: 3) in her maternal grandmother; Congestive Heart Failure in her mother; Drug abuse in her mother; Heart disease (age of onset: 51) in her maternal grandfather; Hypertension in her brother; Irritable bowel syndrome in her mother; Ulcers in her mother. She was  adopted.   ROS:  Please see the history of present illness. Otherwise, complete review of systems is positive for none  All other systems are reviewed and negative.   Physical Exam: VS:  Ht 5\' 4"  (1.626 m)   Wt 224 lb 3.2 oz (101.7 kg)   SpO2 97%   BMI 38.48 kg/m , BMI Body mass index is 38.48 kg/m.  Wt Readings from Last 3 Encounters:  01/06/23 224 lb 3.2 oz (101.7 kg)  12/28/22 222 lb (100.7 kg)  12/19/22 227 lb (103 kg)    General: Patient appears comfortable at rest. HEENT: Conjunctiva and lids normal, oropharynx clear with moist mucosa. Neck: Supple, no elevated JVP or carotid bruits, no thyromegaly. Lungs: Clear to auscultation, nonlabored breathing at rest. Cardiac: Regular rate and rhythm, no S3 or significant systolic murmur, no pericardial rub. Abdomen: Soft, nontender, no hepatomegaly, bowel sounds present, no  guarding or rebound. Extremities: No pitting edema, distal pulses 2+. Skin: Warm and dry. Musculoskeletal: No kyphosis. Neuropsychiatric: Alert and oriented x3, affect grossly appropriate.  Recent Labwork: 11/08/2022: ALT 49; AST 31 12/28/2022: Hemoglobin 14.4; Platelets 308 12/31/2022: BUN 10; Creatinine, Ser 0.69; Magnesium 2.0; Potassium 4.4; Sodium 139; TSH 0.007     Component Value Date/Time   CHOL 229 (H) 11/14/2022 0847   TRIG 223 (H) 11/14/2022 0847   HDL 36 (L) 11/14/2022 0847   CHOLHDL 6.4 (H) 11/14/2022 0847   CHOLHDL 4.8 05/10/2014 0919   VLDL 41 (H) 05/10/2014 0919   LDLCALC 152 (H) 11/14/2022 0847     Assessment and Plan:  Patient is a 32 year old F known to have HTN, hypothyroidism was referred to cardiology clinic for evaluation of presyncope.  # Dizziness and presyncope likely secondary to antihypertensive medications # Palpitations likely in the setting of Adderall use last year and current hyperthyroidism -Discontinue valsartan and continue amlodipine 2.5 mg once daily. Check blood pressures at home in a.m. and p.m. and keep a log.   Her TSH was also severely depressed due to which levothyroxine dose had to be decreased. Will obtain 2-week event monitor,non-live to rule out any arrhythmias.  No family history of sudden cardiac death.  EKG showed NSR and normal QTc interval.  No preexcitation. Will obtain 2D echocardiogram to rule out any structural heart disease.  # HTN, controlled -Patient has a few episodes of low blood pressure, 90/60 mmHg at home when she felt dizzy.  Will stop valsartan and continue amlodipine 2.5 mg once daily.  She also lost around 10 pounds of weight in the last 1 month which probably might have dropped her resting blood pressure.   I have spent a total of 45 minutes with patient reviewing chart, EKGs, labs and examining patient as well as establishing an assessment and plan that was discussed with the patient.  > 50% of time was spent in direct patient care.    Medication Adjustments/Labs and Tests Ordered: Current medicines are reviewed at length with the patient today.  Concerns regarding medicines are outlined above.   Tests Ordered: Orders Placed This Encounter  Procedures   ECHOCARDIOGRAM COMPLETE     Medication Changes: No orders of the defined types were placed in this encounter.   Disposition:  Follow up  pending results  Signed Katy Brickell Verne Spurr, MD, 01/06/2023 1:13 PM    [facility] [address] [place], [place], [place] [address]

## 2023-01-27 ENCOUNTER — Ambulatory Visit: Payer: BC Managed Care – PPO | Admitting: Family Medicine

## 2023-01-27 VITALS — BP 125/78 | HR 102 | Wt 223.4 lb

## 2023-01-27 DIAGNOSIS — E782 Mixed hyperlipidemia: Secondary | ICD-10-CM | POA: Diagnosis not present

## 2023-01-27 DIAGNOSIS — R7303 Prediabetes: Secondary | ICD-10-CM | POA: Diagnosis not present

## 2023-01-27 DIAGNOSIS — I10 Essential (primary) hypertension: Secondary | ICD-10-CM

## 2023-01-27 DIAGNOSIS — E282 Polycystic ovarian syndrome: Secondary | ICD-10-CM

## 2023-01-27 DIAGNOSIS — R42 Dizziness and giddiness: Secondary | ICD-10-CM

## 2023-01-27 DIAGNOSIS — R55 Syncope and collapse: Secondary | ICD-10-CM

## 2023-01-27 NOTE — Progress Notes (Signed)
   Subjective:    Patient ID: Marissa Lane, female    DOB: [birth year], 32 y.o.   MRN: [id number]  HPI Patient arrives today for dizziness. Patient states she is still having lightheadedness She states when she has episodes that her blood pressures have been normal. .  Patient at times lightheaded with some dizziness.  Denies any major setbacks.  No unilateral numbness or weakness.  No severe headaches nausea or vomiting.  Light headed  Primary hypertension  Prediabetes  Mixed hyperlipidemia  Chronic hypertension  Postural dizziness with presyncope  PCOS (polycystic ovarian syndrome)  Morbid obesity (HCC)  She has tried multiple ways of losing weight she is trying to eat healthy she is trying to stay physically active in addition to this she does the best she can and trying to minimize starches in the diet and tries to do walking despite all this and calorie restrictions her weight continues to stay elevated she has hyperlipidemia prediabetes hypertension PCOS she is at high risk of health complications as she gets older including heart disease and stroke it is very important for her to get her weight down.  GLP-1 medicines would be a good choice of medicines to help bring her weight down Review of Systems     Objective:   Physical Exam  General-in no acute distress Eyes-no discharge Lungs-respiratory rate normal, CTA CV-no murmurs,RRR Extremities skin warm dry no edema Neuro grossly normal Behavior normal, alert       Assessment & Plan:  1. Light headed Lightheaded sensation that occurs intermittently. There is still telemetry as well as echo pending for the patient If cardiology rules out causes in regards to the symptoms then consider neurology consult  2. Primary hypertension Blood pressure decent control continue current measures  3. Prediabetes Patient has PCOS, increasing A1c, significant obesity, hyperlipidemia, she is at high risk for coronary artery  disease and stroke as she gets older.  She has tried various ways to be mindful of eating habits and exercise but unable to bring her weight down.  She would benefit from [facility].  We will do a letter for her  4. Mixed hyperlipidemia Significant hyperlipidemia if this does not improve she will need to be on statin  We did discuss the possibility of gastric sleeve versus gastric bypass she will give consideration to this

## 2023-01-27 NOTE — Patient Instructions (Signed)
Discussion of GLP-1 medications Please remember these medications are to assist with weight reduction and diabetes control.  They do not take the place of healthy eating and regular physical activity. There are several GLP-1 medications that can help with weight reduction and diabetes control.  GLP-1 medications with indication for diabetes-Trulicity, Victoza, Bydureon , Mounjaro, Ozempic, and Rybelsus (although these are injected medicines except for Rybelsus which is a pill) GLP-1 medications with indications for weight loss-Wegovy, and Saxenda  Mechanism of action  These medications stimulate glucagon peptide receptors.  By doing so it does the following:  1-slows down stomach emptying-essentially food takes longer to go through the stomach and the intestines-this can lessen appetite  2-reduces glucagon secretion from the pancreas-this helps keep blood glucose levels stable between meals  3-increase his insulin release from the pancreas-with diabetics this helps keep blood glucose levels stable  4-promotes the feeling of being full in the brain-encouraged him receptors in the brain received the signal so the brain and body know that it is time to stop eating Benefits of the medication  1-reduced weight-reduction of weight is more significant at higher dosing.  Not as much weight reduction with Rybelsus   A- typically Wegovy at higher doses can assist with significant weight reduction.  2-improved blood glucose levels-with diabetics typically we see a significant drop with A1c when the medication is adjusted appropriately.  3-decrease risk of heart attacks and strokes-individuals with type 2 diabetes are at increased risk of heart attacks and strokes.  These GLP-1 medications can reduce the risk by 22%.  Risk of GLP-1 medications-these are some of the risks.  It is important to talk with your provider in a shared discussion before starting any medication.  Contraindications to GLP-1  medications-in other words who should not take these.  1-individuals with history of thyroid medullary cancer  2-individuals with family history of multiple endocrine neoplasm syndrome type II (MEN 2)  3-individuals with a history of pancreatitis  4-those with a history of severe hypersensitivity or allergy reactions to GLP-1 medications  5-should be avoided in individuals with a history of suicidal attempts or active suicidal ideation.  Cautions include- 1- risk of thyroid C-cell tumors were seen in rats during clinical testing in humans the relevancy of this information has not been determined 2-risk of pancreatitis including fatal and nonfatal hemorrhagic or necrotizing pancreatitis 3-gallbladder disease slightly increased risk of gallstones 4-hypoglycemia-more common with individuals who are on insulin or sulfonylurea such as glipizide 5-acute kidney injury or worsening of chronic renal failure often this is triggered by severe vomiting and diarrhea as side effects to GLP-1. 6-slightly increased risk of diabetic retinopathy complications in patients with type 2 diabetes 7-heart rate increase-slight increase of base heart rate with GLP-1 8-suicidal behavior and ideation has been reported in clinical trials with other weight loss medicines.  If depression occurs with medication or suicidal ideation stop medication immediately notify provider or seek help.  Common side effects include nausea, vomiting, diarrhea, constipation and increased heart rate.  Less common side effects severe abdominal pain-if this occurs notify provider stop medication get tested for pancreatitis. Full discussion with your provider should occur before starting these medicines.       

## 2023-02-12 ENCOUNTER — Encounter (INDEPENDENT_AMBULATORY_CARE_PROVIDER_SITE_OTHER): Payer: Self-pay | Admitting: Family Medicine

## 2023-02-16 ENCOUNTER — Encounter: Payer: Self-pay | Admitting: Family Medicine

## 2023-02-16 NOTE — Progress Notes (Signed)
Please provide a copy to the patient thank you

## 2023-02-18 ENCOUNTER — Ambulatory Visit
Admission: RE | Admit: 2023-02-18 | Discharge: 2023-02-18 | Disposition: A | Discharge status: Home / Self Care | Payer: BC Managed Care – PPO | Source: Ambulatory Visit | Attending: Nurse Practitioner | Admitting: Nurse Practitioner

## 2023-02-18 VITALS — BP 124/80 | HR 96 | Temp 98.9°F | Resp 18

## 2023-02-18 DIAGNOSIS — J209 Acute bronchitis, unspecified: Secondary | ICD-10-CM

## 2023-02-18 MED ORDER — PSEUDOEPH-BROMPHEN-DM 30-2-10 MG/5ML PO SYRP: 5.0000 mL | ORAL_SOLUTION | Freq: Four times a day (QID) | ORAL | 0 refills | Status: DC | PRN

## 2023-02-18 MED ORDER — PREDNISONE 20 MG PO TABS: 40.0000 mg | ORAL_TABLET | Freq: Every day | ORAL | 0 refills | Status: AC

## 2023-02-18 NOTE — ED Triage Notes (Signed)
Cough since Friday.  States she feels a rattle in her chest since Sunday.  Cough is nonproductive.

## 2023-02-18 NOTE — Discharge Instructions (Addendum)
Take medication as prescribed. Increase fluids and allow for plenty of rest.  Make sure you are drinking at least 8-10 8 ounce glass of water daily. May take over-the-counter Tylenol or ibuprofen as needed for pain, fever, general discomfort. Recommend using a humidifier in your bedroom at nighttime during sleep and sleeping elevated on pillows while cough symptoms persist. Continue to decrease your smoking, and consider smoking cessation at this time. If you develop worsening cough, shortness of breath, with new symptoms of high fever, chills, or other concerns, please follow-up in this clinic for further evaluation. As discussed, if your cough continues but you feel well, recommend over-the-counter cough drops, and staying hydrated.  The cough may last for a few weeks, but should improve. Follow-up as needed.

## 2023-02-18 NOTE — ED Provider Notes (Signed)
[facility]    CSN: [id number] Arrival date & time: 02/18/23  0850      History   Chief Complaint Chief Complaint  Patient presents with   Cough    Occasional wheeze, dry, non-productive cough, malaise, aching - Entered by patient    HPI Marissa Lane is a 32 y.o. female.   The history is provided by the patient.   Patient presents for complaints of cough that started approximately 4 days ago.  Patient reports that she has also had chills, and at times felt "hot".  She denies headache, ear pain, sore throat, nasal congestion, runny nose, wheezing, shortness of breath, difficulty breathing, chest pain, nausea, vomiting, or diarrhea.  Currently cough is nonproductive.  Patient states that she has heard a "rattle" in her chest.  She has taken 2 home COVID test, both were negative.  Patient reports she has been taking Tylenol for her symptoms.  Patient reports she is currently smoking, but is smoking less since her symptoms started.  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 83   GERD (gastroesophageal reflux disease)    Gestational diabetes    H/O urinary frequency 06/25/2013   Hypothyroidism    Irregular intermenstrual bleeding 10/12/2013   Lipoma    right scapula   MRSA infection    Obesity    Ovarian cancer (HCC)    Right   Palpitations    PCOS (polycystic ovarian syndrome)    Postpartum depression    Tobacco abuse     Patient Active Problem List   Diagnosis Date Noted   Postural dizziness with presyncope 01/06/2023   Palpitations 01/06/2023   Prediabetes 11/15/2022   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   Mass of soft tissue of shoulder 03/20/2021   Papillary thyroid carcinoma (HCC) 03/20/2021   Lipoma of back 01/18/2021   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   Cyst of  right ovary 01/02/2021   Cyst of ovary, left 11/02/2020   Encounter for IUD insertion 11/02/2020   Pregnancy examination or test, negative result 11/02/2020   GAD (generalized anxiety disorder) 08/24/2019   Attention deficit hyperactivity disorder (ADHD), 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   Cervical high risk HPV (human papillomavirus) test positive 01/09/2017   History of gestational diabetes 07/19/2016   Chronic hypertension 04/30/2016   Smoker 03/05/2016   Migraine without aura and with status migrainosus, not intractable 04/06/2015   Anxiety 09/21/2013   PCOS (polycystic ovarian syndrome) 12/11/2012   Erosive esophagitis 11/05/2012   Morbid obesity (HCC) 09/14/2010    Past Surgical History:  Procedure Laterality Date   CHOLECYSTECTOMY  03/06/2012   Procedure: LAPAROSCOPIC CHOLECYSTECTOMY;  Surgeon: Fabio Bering, MD;  Location: [facility];  Service: General;  Laterality: N/A;   ESOPHAGOGASTRODUODENOSCOPY N/A 09/09/2012   Procedure: ESOPHAGOGASTRODUODENOSCOPY (EGD);  Surgeon: Malissa Hippo, MD;  Location: [facility];  Service: Endoscopy;  Laterality: N/A;  340   MASS EXCISION Right 03/23/2021   Procedure: EXCISION SOFT TISSUE MASS ON RIGHT SHOULDER;  Surgeon: Darnell Level, MD;  Location: [facility];  Service: General;  Laterality: Right;   MOUTH SURGERY     ROBOTIC ASSISTED LAPAROSCOPIC OVARIAN CYSTECTOMY Left 01/02/2021   Procedure: XI ROBOTIC ASSISTED LEFT LAPAROSCOPIC OVARIAN CYSTECTOMY;  Surgeon: Adolphus Birchwood, MD;  Location: [facility];  Service: Gynecology;  Laterality: Left;   ROBOTIC ASSISTED LAPAROSCOPIC OVARIAN CYSTECTOMY Left 11/05/2022   Procedure: XI ROBOTIC ASSISTED LAPAROSCOPIC LEFT OVARIAN CYSTECTOMY;  Surgeon: Clide Cliff, MD;  Location: [facility];  Service: Gynecology;  Laterality: Left;   ROBOTIC ASSISTED SALPINGO OOPHERECTOMY Right 01/02/2021   Procedure: XI ROBOTIC ASSISTED RIGHT SALPINGO OOPHORECTOMY;  Surgeon: Adolphus Birchwood, MD;  Location: [facility];  Service: Gynecology;  Laterality:  Right;   SKIN GRAFT  2009   gum graft   THYROIDECTOMY N/A 03/23/2021   Procedure: TOTAL THYROIDECTOMY;  Surgeon: Darnell Level, MD;  Location: [facility];  Service: General;  Laterality: N/A;   TONSILLECTOMY      OB History     Gravida  3   Para  3   Term  3   Preterm      AB      Living  3      SAB      IAB      Ectopic      Multiple  0   Live Births  3            Home Medications    Prior to Admission medications   Medication Sig Start Date End Date Taking? Authorizing Provider  brompheniramine-pseudoephedrine-DM 30-2-10 MG/5ML syrup Take 5 mLs by mouth 4 (four) times daily as needed. 02/18/23  Yes Draya Felker-Warren, Sadie Haber, NP  predniSONE (DELTASONE) 20 MG tablet Take 2 tablets (40 mg total) by mouth daily with breakfast for 5 days. 02/18/23 02/23/23 Yes Fortune Brannigan-Warren, Sadie Haber, NP  acetaminophen (TYLENOL) 500 MG tablet Take 500 mg by mouth every 6 (six) hours as needed for moderate pain.    [provider]  amLODipine (NORVASC) 2.5 MG tablet Take 1 tablet (2.5 mg total) by mouth daily. 12/31/22   Babs Sciara, MD  famotidine-calcium carbonate-magnesium hydroxide (PEPCID COMPLETE) 10-800-165 MG chewable tablet Chew 1 tablet by mouth daily as needed (acid reflux).    [provider]  ibuprofen (ADVIL) 200 MG tablet Take 200-400 mg by mouth every 6 (six) hours as needed for moderate pain.    [provider]  levonorgestrel (MIRENA) 20 MCG/24HR IUD 1 each by Intrauterine route once.    [provider]  levothyroxine (SYNTHROID) 112 MCG tablet Take 175 mcg by mouth daily before breakfast.    [provider]    Family History Family History  Adopted: Yes  Problem Relation Age of Onset   Congestive Heart Failure Mother    Irritable bowel syndrome Mother    Ulcers Mother    Anemia Mother    Drug abuse Mother    Hypertension Brother    Colon cancer Maternal Grandmother 95   Anxiety disorder Maternal Grandmother    Heart  disease Maternal Grandfather 85       MI    Social History Social History   Tobacco Use   Smoking status: Every Day    Current packs/day: 1.00    Average packs/day: 1 pack/day for 10.0 years (10.0 ttl pk-yrs)    Types: Cigarettes   Smokeless tobacco: Never  Vaping Use   Vaping status: Every Day  Substance Use Topics   Alcohol use: No   Drug use: No     Allergies   Other, Bupropion, Cymbalta [duloxetine hcl], Oxycodone, Silicone, Vicodin [hydrocodone-acetaminophen], and Zofran [ondansetron hcl]   Review of Systems Review of Systems Per HPI  Physical Exam Triage Vital Signs ED Triage Vitals  Encounter Vitals Group     BP 02/18/23 0908 124/80     Systolic BP Percentile --      Diastolic BP Percentile --      Pulse Rate 02/18/23 0908 96     Resp 02/18/23 0908 18     Temp 02/18/23 0908 98.9 F (37.2 C)     Temp Source 02/18/23 0908 Oral     SpO2 02/18/23 0908 96 %     Weight --      Height --      Head Circumference --      Peak Flow --      Pain Score 02/18/23 0910 0     Pain Loc --      Pain Education --      Exclude from Growth Chart --    No data found.  Updated Vital Signs BP 124/80 (BP Location: Right Arm)   Pulse 96   Temp 98.9 F (37.2 C) (Oral)   Resp 18   SpO2 96%   Visual Acuity Right Eye Distance:   Left Eye Distance:   Bilateral Distance:    Right Eye Near:   Left Eye Near:    Bilateral Near:     Physical Exam Vitals and nursing note reviewed.  Constitutional:      General: She is not in acute distress.    Appearance: Normal appearance.  HENT:     Head: Normocephalic.     Right Ear: Tympanic membrane, ear canal and external ear normal.     Left Ear: Tympanic membrane, ear canal and external ear normal.     Nose: Congestion present.     Mouth/Throat:     Lips: Pink.     Mouth: Mucous membranes are moist.     Pharynx: Oropharynx is clear. Uvula midline. Posterior oropharyngeal erythema present. No pharyngeal swelling,  oropharyngeal exudate, uvula swelling or postnasal drip.  Eyes:     Extraocular Movements: Extraocular movements intact.     Conjunctiva/sclera: Conjunctivae normal.     Pupils: Pupils are equal, round, and reactive to light.  Cardiovascular:     Rate and Rhythm: Normal rate and regular rhythm.     Pulses: Normal pulses.     Heart sounds: Normal heart sounds.  Pulmonary:     Effort: Pulmonary effort is normal. No respiratory distress.     Breath sounds: Normal breath sounds. No stridor. No wheezing, rhonchi (rhonchi present in bilateral posterior lower lobes, clear with cough) or rales.  Abdominal:     General: Bowel sounds are normal.     Palpations: Abdomen is soft.     Tenderness: There is no abdominal tenderness.  Musculoskeletal:     Cervical back: Normal range of motion.  Lymphadenopathy:     Cervical: No cervical adenopathy.  Skin:    General: Skin is warm and dry.  Neurological:     General: No focal deficit present.     Mental Status: She is alert and oriented to person, place, and time.  Psychiatric:        Mood and Affect: Mood normal.        Behavior: Behavior normal.      UC Treatments / Results  Labs (all labs ordered are listed, but only abnormal results are displayed) Labs Reviewed - No data to display  EKG   Radiology No results found.  Procedures Procedures (including critical care time)  Medications Ordered in UC Medications - No data to display  Initial Impression / Assessment  and Plan / UC Course  I have reviewed the triage vital signs and the nursing notes.  Pertinent labs & imaging results that were available during my care of the patient were reviewed by me and considered in my medical decision making (see chart for details).   The patient is well-appearing, she is in no acute distress, vital signs are stable.  Suspect acute bronchitis given the presence of rhonchi that clears with coughing, and nonproductive cough.  On exam, there is no  obvious wheezing or rales present.  Room air sats at 96%, no indication for imaging at this time.  Will treat symptomatically with Bromfed-DM for the cough, and prednisone 40 mg for the next 5 days.  Supportive care recommendations were provided and discussed with the patient to include over-the-counter analgesics for pain, fever, general discomfort, increasing her fluid intake, use of a humidifier in her bedroom at nighttime during sleep, and an attempt to stop smoking while symptoms persist, to lead to permanent smoking cessation.  Discussed follow-up indications with the patient.  Patient was in agreement with this plan of care, and verbalizes understanding.  All questions were answered.  Patient stable for discharge.   Final Clinical Impressions(s) / UC Diagnoses   Final diagnoses:  Acute bronchitis, unspecified organism     Discharge Instructions      Take medication as prescribed. Increase fluids and allow for plenty of rest.  Make sure you are drinking at least 8-10 8 ounce glass of water daily. May take over-the-counter Tylenol or ibuprofen as needed for pain, fever, general discomfort. Recommend using a humidifier in your bedroom at nighttime during sleep and sleeping elevated on pillows while cough symptoms persist. Continue to decrease your smoking, and consider smoking cessation at this time. If you develop worsening cough, shortness of breath, with new symptoms of high fever, chills, or other concerns, please follow-up in this clinic for further evaluation. As discussed, if your cough continues but you feel well, recommend over-the-counter cough drops, and staying hydrated.  The cough may last for a few weeks, but should improve. Follow-up as needed.     ED Prescriptions     Medication Sig Dispense Auth. Provider   brompheniramine-pseudoephedrine-DM 30-2-10 MG/5ML syrup Take 5 mLs by mouth 4 (four) times daily as needed. 140 mL Khing Belcher-Warren, Sadie Haber, NP   predniSONE  (DELTASONE) 20 MG tablet Take 2 tablets (40 mg total) by mouth daily with breakfast for 5 days. 10 tablet Kassity Woodson-Warren, Sadie Haber, NP      PDMP not reviewed this encounter.   Abran Cantor, NP 02/18/23 [phone]

## 2023-02-19 ENCOUNTER — Encounter: Payer: Self-pay | Admitting: Internal Medicine

## 2023-02-19 ENCOUNTER — Ambulatory Visit (HOSPITAL_COMMUNITY)
Admission: RE | Admit: 2023-02-19 | Discharge: 2023-02-19 | Disposition: A | Discharge status: Home / Self Care | Payer: BC Managed Care – PPO | Source: Ambulatory Visit | Attending: Internal Medicine | Admitting: Internal Medicine

## 2023-02-19 ENCOUNTER — Telehealth: Payer: Self-pay | Admitting: Internal Medicine

## 2023-02-19 DIAGNOSIS — R42 Dizziness and giddiness: Secondary | ICD-10-CM | POA: Diagnosis not present

## 2023-02-19 DIAGNOSIS — I10 Essential (primary) hypertension: Secondary | ICD-10-CM

## 2023-02-19 LAB — ECHOCARDIOGRAM COMPLETE
AR max vel: 2.82 cm2
AV Area VTI: 2.82 cm2
AV Area mean vel: 2.6 cm2
AV Mean grad: 4.2 mmHg
AV Peak grad: 8.2 mmHg
Ao pk vel: 1.43 m/s
Area-P 1/2: 4.31 cm2
S' Lateral: 2.1 cm

## 2023-02-19 NOTE — Telephone Encounter (Signed)
Pt c/o medication issue:  1. Name of Medication: predniSONE (DELTASONE) 20 MG tablet   2. How are you currently taking this medication (dosage and times per day)?   Not started yet  3. Are you having a reaction (difficulty breathing--STAT)?   4. What is your medication issue?   Patient stated she has bronchitis and was prescribed this medication.  Patient wants to know if taking this medication will affect her other medications and can she still do her echocardiogram test this afternoon.

## 2023-02-19 NOTE — Telephone Encounter (Signed)
Pt is requesting a callback to f/u on what she should be advised to do. Please advise

## 2023-02-19 NOTE — Progress Notes (Signed)
*  PRELIMINARY RESULTS* Echocardiogram 2D Echocardiogram has been performed.  Stacey Drain 02/19/2023, 3:13 PM

## 2023-02-20 NOTE — Telephone Encounter (Signed)
Echo completed  MyChart Message sent to continue medication as prescribed

## 2023-02-25 ENCOUNTER — Encounter: Payer: Self-pay | Admitting: Family Medicine

## 2023-02-26 NOTE — Telephone Encounter (Signed)
Front desk calling to see if patient would like to see Dr Adriana Simas tomorrow.

## 2023-02-27 ENCOUNTER — Ambulatory Visit: Payer: BC Managed Care – PPO | Admitting: Family Medicine

## 2023-02-28 ENCOUNTER — Ambulatory Visit: Payer: Self-pay

## 2023-03-14 ENCOUNTER — Encounter: Payer: Self-pay | Admitting: Family Medicine

## 2023-03-26 ENCOUNTER — Institutional Professional Consult (permissible substitution): Payer: BC Managed Care – PPO | Admitting: Pulmonary Disease

## 2023-04-02 ENCOUNTER — Encounter: Payer: Self-pay | Admitting: Family Medicine

## 2023-04-02 DIAGNOSIS — R55 Syncope and collapse: Secondary | ICD-10-CM

## 2023-04-02 DIAGNOSIS — R42 Dizziness and giddiness: Secondary | ICD-10-CM

## 2023-04-03 NOTE — Telephone Encounter (Signed)
Nurses I saw Deb back in July It would be fine to send neuro referral Plz do And plz let Deb know this has been initiated Thanks Dr Lorin Picket

## 2023-04-25 ENCOUNTER — Other Ambulatory Visit: Payer: Self-pay | Admitting: Family Medicine

## 2023-04-28 ENCOUNTER — Ambulatory Visit: Payer: BC Managed Care – PPO | Admitting: Diagnostic Neuroimaging

## 2023-04-28 ENCOUNTER — Encounter: Payer: Self-pay | Admitting: Diagnostic Neuroimaging

## 2023-04-28 ENCOUNTER — Other Ambulatory Visit: Payer: Self-pay | Admitting: Nurse Practitioner

## 2023-04-28 DIAGNOSIS — E782 Mixed hyperlipidemia: Secondary | ICD-10-CM

## 2023-04-28 DIAGNOSIS — R739 Hyperglycemia, unspecified: Secondary | ICD-10-CM

## 2023-04-28 NOTE — Telephone Encounter (Signed)
There is an automatic refill request that came through, I spoke with the patient she has just started her wegovy about a week ago due to recently moving out of her home. She will be calling back when ready for the next dose. She would like to continue slowly with this medication to ensure tolerance. She is not reporting any side effects. Will there be any labs that need to be ordered. Please advsie

## 2023-04-29 ENCOUNTER — Institutional Professional Consult (permissible substitution): Payer: BC Managed Care – PPO | Admitting: Internal Medicine

## 2023-04-29 NOTE — Telephone Encounter (Signed)
May do metabolic 7, lipid, A1c before follow-up visit diagnosis hyperglycemia hyperlipidemia obesity  Based on the message I doubt that this needs refill?  If so it would be fine to refuse this prescription since she already has thank you

## 2023-05-13 NOTE — Telephone Encounter (Signed)
Nurses Please add CBC along with TIBC and ferritin due to iron deficient anemia Please touch base with patient if this is a separate blood order make sure she knows to request both lab orders from earlier in October as well as this 1 or if it is consolidated she only has to do the 1 blood order thank you

## 2023-05-14 ENCOUNTER — Other Ambulatory Visit: Payer: Self-pay

## 2023-05-14 DIAGNOSIS — E611 Iron deficiency: Secondary | ICD-10-CM

## 2023-05-15 ENCOUNTER — Institutional Professional Consult (permissible substitution): Payer: BC Managed Care – PPO | Admitting: Pulmonary Disease

## 2023-05-22 LAB — LIPID PANEL
Chol/HDL Ratio: 5.9 {ratio} — ABNORMAL HIGH (ref 0.0–4.4)
Cholesterol, Total: 241 mg/dL — ABNORMAL HIGH (ref 100–199)
HDL: 41 mg/dL (ref >39)
LDL Chol Calc (NIH): 165 mg/dL — ABNORMAL HIGH (ref 0–99)
Triglycerides: 188 mg/dL — ABNORMAL HIGH (ref 0–149)
VLDL Cholesterol Cal: 35 mg/dL (ref 5–40)

## 2023-05-22 LAB — CBC WITH DIFFERENTIAL/PLATELET
Basophils Absolute: 0.1 10*3/uL (ref 0.0–0.2)
Basos: 1 %
EOS (ABSOLUTE): 0.2 10*3/uL (ref 0.0–0.4)
Eos: 3 %
Hematocrit: 45.2 % (ref 34.0–46.6)
Hemoglobin: 15 g/dL (ref 11.1–15.9)
Immature Grans (Abs): 0 10*3/uL (ref 0.0–0.1)
Immature Granulocytes: 1 %
Lymphocytes Absolute: 2.3 10*3/uL (ref 0.7–3.1)
Lymphs: 29 %
MCH: 30.5 pg (ref 26.6–33.0)
MCHC: 33.2 g/dL (ref 31.5–35.7)
MCV: 92 fL (ref 79–97)
Monocytes Absolute: 0.6 10*3/uL (ref 0.1–0.9)
Monocytes: 7 %
Neutrophils Absolute: 4.8 10*3/uL (ref 1.4–7.0)
Neutrophils: 59 %
Platelets: 343 10*3/uL (ref 150–450)
RBC: 4.92 x10E6/uL (ref 3.77–5.28)
RDW: 12.4 % (ref 11.7–15.4)
WBC: 8 10*3/uL (ref 3.4–10.8)

## 2023-05-22 LAB — BASIC METABOLIC PANEL
BUN/Creatinine Ratio: 17 (ref 9–23)
BUN: 11 mg/dL (ref 6–20)
CO2: 22 mmol/L (ref 20–29)
Calcium: 9.2 mg/dL (ref 8.7–10.2)
Chloride: 103 mmol/L (ref 96–106)
Creatinine, Ser: 0.65 mg/dL (ref 0.57–1.00)
Glucose: 92 mg/dL (ref 70–99)
Potassium: 4.1 mmol/L (ref 3.5–5.2)
Sodium: 139 mmol/L (ref 134–144)
eGFR: 119 mL/min/{1.73_m2} (ref >59)

## 2023-05-22 LAB — HEMOGLOBIN A1C
Est. average glucose Bld gHb Est-mCnc: 117 mg/dL
Hgb A1c MFr Bld: 5.7 % — ABNORMAL HIGH (ref 4.8–5.6)

## 2023-05-22 LAB — IRON,TIBC AND FERRITIN PANEL
Ferritin: 76 ng/mL (ref 15–150)
Iron Saturation: 24 % (ref 15–55)
Iron: 75 ug/dL (ref 27–159)
Total Iron Binding Capacity: 309 ug/dL (ref 250–450)
UIBC: 234 ug/dL (ref 131–425)

## 2023-05-23 ENCOUNTER — Other Ambulatory Visit: Payer: Self-pay | Admitting: Family Medicine

## 2023-06-05 ENCOUNTER — Encounter: Payer: Self-pay | Admitting: Primary Care

## 2023-06-05 ENCOUNTER — Ambulatory Visit (INDEPENDENT_AMBULATORY_CARE_PROVIDER_SITE_OTHER): Payer: BC Managed Care – PPO | Admitting: Primary Care

## 2023-06-05 VITALS — BP 128/70 | HR 101 | Temp 98.2°F | Ht 64.0 in | Wt 218.6 lb

## 2023-06-05 DIAGNOSIS — R0681 Apnea, not elsewhere classified: Secondary | ICD-10-CM

## 2023-06-05 NOTE — Patient Instructions (Addendum)
Sleep apnea is defined as period of 10 seconds or longer when you stop breathing at night. This can happen multiple times a night. Dx sleep apnea is when this occurs more than 5 times an hour.    Mild OSA 5-15 apneic events an hour Moderate OSA 15-30 apneic events an hour Severe OSA > 30 apneic events an hour   Untreated sleep apnea puts you at higher risk for cardiac arrhythmias, pulmonary HTN, stroke and diabetes  Treatment options include weight loss, side sleeping position, oral appliance, CPAP therapy or referral to ENT for possible surgical options    Recommendations: Focus on side sleeping position or elevate head with wedge pillow 30 degrees Work on weight loss efforts if able  Do not drive if experiencing excessive daytime sleepiness of fatigue    Orders: Home sleep study re: loud snoring    Follow-up: Please call to schedule follow-up 1-2 weeks after completing home sleep study to review results and treatment if needed (can be virtual)  Sleep Apnea Sleep apnea affects breathing during sleep. It causes breathing to stop for 10 seconds or more, or to become shallow. People with sleep apnea usually snore loudly. It can also increase the risk of: Heart attack. Stroke. Being very overweight (obese). Diabetes. Heart failure. Irregular heartbeat. High blood pressure. The goal of treatment is to help you breathe normally again. What are the causes?  The most common cause of this condition is a collapsed or blocked airway. There are three kinds of sleep apnea: Obstructive sleep apnea. This is caused by a blocked or collapsed airway. Central sleep apnea. This happens when the brain does not send the right signals to the muscles that control breathing. Mixed sleep apnea. This is a combination of obstructive and central sleep apnea. What increases the risk? Being overweight. Smoking. Having a small airway. Being older. Being female. Drinking alcohol. Taking medicines to  calm yourself (sedatives or tranquilizers). Having family members with the condition. Having a tongue or tonsils that are larger than normal. What are the signs or symptoms? Trouble staying asleep. Loud snoring. Headaches in the morning. Waking up gasping. Dry mouth or sore throat in the morning. Being sleepy or tired during the day. If you are sleepy or tired during the day, you may also: Not be able to focus your mind (concentrate). Forget things. Get angry a lot and have mood swings. Feel sad (depressed). Have changes in your personality. Have less interest in sex, if you are female. Be unable to have an erection, if you are female. How is this treated?  Sleeping on your side. Using a medicine to get rid of mucus in your nose (decongestant). Avoiding the use of alcohol, medicines to help you relax, or certain pain medicines (narcotics). Losing weight, if needed. Changing your diet. Quitting smoking. Using a machine to open your airway while you sleep, such as: An oral appliance. This is a mouthpiece that shifts your lower jaw forward. A CPAP device. This device blows air through a mask when you breathe out (exhale). An EPAP device. This has valves that you put in each nostril. A BIPAP device. This device blows air through a mask when you breathe in (inhale) and breathe out. Having surgery if other treatments do not work. Follow these instructions at home: Lifestyle Make changes that your doctor recommends. Eat a healthy diet. Lose weight if needed. Avoid alcohol, medicines to help you relax, and some pain medicines. Do not smoke or use any products that contain   nicotine or tobacco. If you need help quitting, ask your doctor. General instructions Take over-the-counter and prescription medicines only as told by your doctor. If you were given a machine to use while you sleep, use it only as told by your doctor. If you are having surgery, make sure to tell your doctor you have  sleep apnea. You may need to bring your device with you. Keep all follow-up visits. Contact a doctor if: The machine that you were given to use during sleep bothers you or does not seem to be working. You do not get better. You get worse. Get help right away if: Your chest hurts. You have trouble breathing in enough air. You have an uncomfortable feeling in your back, arms, or stomach. You have trouble talking. One side of your body feels weak. A part of your face is hanging down. These symptoms may be an emergency. Get help right away. Call your local emergency services (911 in the [place] Do not wait to see if the symptoms will go away. Do not drive yourself to the hospital. Summary This condition affects breathing during sleep. The most common cause is a collapsed or blocked airway. The goal of treatment is to help you breathe normally while you sleep. This information is not intended to replace advice given to you by your health care provider. Make sure you discuss any questions you have with your health care provider. Document Revised: 02/14/2021 Document Reviewed: 06/16/2020 Elsevier Patient Education  2024 [employer] Inc[employer]  

## 2023-06-05 NOTE — Progress Notes (Signed)
@Patient  ID: Marissa Lane, female    DOB: January 04, 1991, 32 y.o.   MRN: [id number]  Chief Complaint  Patient presents with   Consult    Sleep consult-witnessed apnea     Referring provider: Campbell Riches, NP  HPI: 32 year old female, current everyday smoker.  Past medical history significant for hypertension, migraine headache, PCOS, anxiety, ADHD, diabetes, obesity, tobacco abuse.  06/05/2023 Discussed the use of AI scribe software for clinical note transcription with the patient, who gave verbal consent to proceed.  History of Present Illness   The patient, with a history of thyroid cancer and thyroidectomy, presents for a sleep consult due to observed apneic episodes during sleep. The patient's spouse, who was previously diagnosed with sleep apnea, reported witnessing the patient stop breathing several times throughout the night. The patient also reports instances of waking up gasping for air and experiencing a sensation of falling. The patient admits to snoring. The patient works night shifts three days a week and maintains a daytime schedule on off days, typically falling asleep within fifteen minutes. She reports waking up once a night approximately twice a week. The patient experiences constant fatigue, which she attributes to both her disrupted sleep and ongoing thyroid issues.  The patient's mother had a history of sleep apnea. The patient is currently on suppressive doses of levothyroxine due to a history of thyroid cancer and thyroidectomy. She reports occasional palpitations but has had a full cardiac workup with no significant findings. The patient does not consume alcohol and typically sleeps on her side, as she has noticed increased apneic episodes when sleeping on her back. The patient does not take any sleep aids. No history of seizures or sudden sleep attacks. No previous sleep studies.      Allergies  Allergen Reactions   Other Itching and Rash    DERMABOND-RASH  WITH ITCHING AT INCISION WITH THYROID SURGERY   Bupropion Other (See Comments)    WORSENING DEPRESSION   Cymbalta [Duloxetine Hcl] Other (See Comments)    Worsening depression   Oxycodone Itching    Redness   Silicone Itching    Contacts-eyes itching   Vicodin [Hydrocodone-Acetaminophen] Other (See Comments)    Bad migraine   Zofran [Ondansetron Hcl] Itching    Immunization History  Administered Date(s) Administered   DTaP 02/16/1990, 04/20/1990, 11/12/1990, 05/18/1992, 03/07/1994   HIB (PRP-OMP) 04/20/1990, 11/12/1990, 06/07/1991   Hep B, Unspecified 12/28/2003, 01/27/2004, 12/28/2014   Hepatitis B 12/28/2003, 01/27/2004   IPV 02/16/1990, 04/20/1990, 05/18/1992, 03/07/1994   Influenza Split 05/21/2023   Influenza,inj,Quad PF,6+ Mos 05/09/2014, 04/30/2016, 04/18/2017, 04/09/2018, 07/06/2019   Influenza-Unspecified 04/22/2011, 04/27/2015, 04/22/2021, 04/09/2022, 04/23/2022   MMR 03/07/1994, 09/30/1994   Meningococcal Conjugate 10/20/2008   Moderna Sars-Covid-2 Vaccination 02/24/2020, 04/25/2020   PPD Test 09/21/2013, 02/01/2015, 02/22/2015, 03/08/2015   Td 12/28/2003   Tdap 09/21/2013, 10/14/2018    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 62   GERD (gastroesophageal reflux disease)    Gestational diabetes    H/O urinary frequency 06/25/2013   Hypothyroidism    Irregular intermenstrual bleeding 10/12/2013   Lipoma    right scapula   MRSA infection    Obesity    Ovarian cancer (HCC)    Right   Palpitations  PCOS (polycystic ovarian syndrome)    Postpartum depression    Tobacco abuse     Tobacco History: Social History   Tobacco Use  Smoking Status Every Day   Current packs/day: 1.00   Average packs/day: 1 pack/day for 10.0 years (10.0 ttl pk-yrs)   Types: Cigarettes  Smokeless Tobacco Never   Ready  to quit: Not Answered Counseling given: Not Answered   Outpatient Medications Prior to Visit  Medication Sig Dispense Refill   acetaminophen (TYLENOL) 500 MG tablet Take 500 mg by mouth every 6 (six) hours as needed for moderate pain.     amLODipine (NORVASC) 2.5 MG tablet Take 1 tablet by mouth once daily 30 tablet 0   famotidine-calcium carbonate-magnesium hydroxide (PEPCID COMPLETE) 10-800-165 MG chewable tablet Chew 1 tablet by mouth daily as needed (acid reflux).     ibuprofen (ADVIL) 200 MG tablet Take 200-400 mg by mouth every 6 (six) hours as needed for moderate pain.     levonorgestrel (MIRENA) 20 MCG/24HR IUD 1 each by Intrauterine route once.     levothyroxine (SYNTHROID) 112 MCG tablet Take 150 mcg by mouth daily before breakfast.     brompheniramine-pseudoephedrine-DM 30-2-10 MG/5ML syrup Take 5 mLs by mouth 4 (four) times daily as needed. (Patient not taking: Reported on 06/05/2023) 140 mL 0   No facility-administered medications prior to visit.    Review of Systems  Review of Systems  Constitutional:  Positive for fatigue.  HENT: Negative.    Respiratory: Negative.    Cardiovascular:  Positive for palpitations.  Psychiatric/Behavioral:  Positive for sleep disturbance.      Physical Exam  BP 128/70 (BP Location: Left Arm, Cuff Size: Large)   Pulse (!) 101   Temp 98.2 F (36.8 C) (Temporal)   Ht 5\' 4"  (1.626 m)   Wt 218 lb 9.6 oz (99.2 kg)   SpO2 99%   BMI 37.52 kg/m  Physical Exam Constitutional:      Appearance: Normal appearance. She is well-developed. She is not ill-appearing.  HENT:     Head: Normocephalic and atraumatic.     Mouth/Throat:     Mouth: Mucous membranes are moist.     Pharynx: Oropharynx is clear.  Eyes:     Extraocular Movements: EOM normal.     Pupils: Pupils are equal, round, and reactive to light.  Cardiovascular:     Rate and Rhythm: Normal rate and regular rhythm.     Heart sounds: Normal heart sounds. No murmur  heard. Pulmonary:     Effort: Pulmonary effort is normal. No respiratory distress.     Breath sounds: Normal breath sounds. No wheezing.  Musculoskeletal:        General: Normal range of motion.     Cervical back: Normal range of motion and neck supple.  Skin:    General: Skin is warm and dry.     Findings: No erythema or rash.  Neurological:     General: No focal deficit present.     Mental Status: She is alert and oriented to person, place, and time. Mental status is at baseline.  Psychiatric:        Mood and Affect: Mood and affect and mood normal.        Behavior: Behavior normal.        Thought Content: Thought content normal.        Judgment: Judgment normal.      Lab Results:  CBC    Component Value Date/Time   WBC 8.0 05/21/2023  1640   WBC 7.4 12/28/2022 2131   RBC 4.92 05/21/2023 1640   RBC 4.73 12/28/2022 2131   HGB 15.0 05/21/2023 1640   HCT 45.2 05/21/2023 1640   PLT 343 05/21/2023 1640   MCV 92 05/21/2023 1640   MCH 30.5 05/21/2023 1640   MCH 30.4 12/28/2022 2131   MCHC 33.2 05/21/2023 1640   MCHC 34.6 12/28/2022 2131   RDW 12.4 05/21/2023 1640   LYMPHSABS 2.3 05/21/2023 1640   MONOABS 0.5 12/28/2022 2131   EOSABS 0.2 05/21/2023 1640   BASOSABS 0.1 05/21/2023 1640    BMET    Component Value Date/Time   NA 139 05/21/2023 1634   K 4.1 05/21/2023 1634   CL 103 05/21/2023 1634   CO2 22 05/21/2023 1634   GLUCOSE 92 05/21/2023 1634   GLUCOSE 105 (H) 12/28/2022 2131   BUN 11 05/21/2023 1634   CREATININE 0.65 05/21/2023 1634   CALCIUM 9.2 05/21/2023 1634   GFRNONAA >60 12/28/2022 2131   GFRAA 136 09/08/2020 0811    BNP No results found for: "BNP"  ProBNP No results found for: "PROBNP"  Imaging: No results found.   Assessment & Plan:   1. Witnessed episode of apnea - Home sleep test; Future  Loud snoring Reports of witnessed apneas and loud snoring. No prior sleep study. Family history of sleep apnea. Patient works night shifts and  reports fatigue, but also has a history of thyroid cancer and is on suppressive levothyroxine therapy. -Reviewed risks of untreated sleep apnea including cardiac arrhythmias, pulmonary hypertension, diabetes and stroke.  We also briefly discussed treatment options including weight loss, oral appliance, CPAP therapy referral to ENT for possible surgical options. -Order home sleep study through [facility]. -Consider referral to orthodontic dentist for oral appliance if sleep apnea is confirmed and patient is unable to tolerate CPAP due to claustrophobia.  Thyroid Cancer Patient is on suppressive levothyroxine therapy following thyroidectomy in 2022. Recent TSH was low. Continue current management with Dr. Talmage Coin at [facility].   Glenford Bayley, NP 06/05/2023

## 2023-06-17 ENCOUNTER — Ambulatory Visit: Payer: BC Managed Care – PPO | Admitting: Family Medicine

## 2023-06-17 ENCOUNTER — Other Ambulatory Visit: Payer: Self-pay

## 2023-06-17 VITALS — BP 122/86 | HR 86 | Temp 97.9°F | Ht 64.0 in | Wt 214.0 lb

## 2023-06-17 DIAGNOSIS — R42 Dizziness and giddiness: Secondary | ICD-10-CM

## 2023-06-17 DIAGNOSIS — I10 Essential (primary) hypertension: Secondary | ICD-10-CM | POA: Diagnosis not present

## 2023-06-17 DIAGNOSIS — R7303 Prediabetes: Secondary | ICD-10-CM | POA: Diagnosis not present

## 2023-06-17 DIAGNOSIS — E782 Mixed hyperlipidemia: Secondary | ICD-10-CM | POA: Diagnosis not present

## 2023-06-17 DIAGNOSIS — E282 Polycystic ovarian syndrome: Secondary | ICD-10-CM

## 2023-06-17 MED ORDER — WEGOVY 0.25 MG/0.5ML ~~LOC~~ SOAJ: SUBCUTANEOUS | 2 refills | Status: DC

## 2023-06-17 NOTE — Progress Notes (Signed)
Subjective:    Patient ID: Marissa Lane, female    DOB: Dec 27, 1990, 32 y.o.   MRN: [id number]  HPI Discussed the use of AI scribe software for clinical note transcription with the patient, who gave verbal consent to proceed.  History of Present Illness   The patient, with a history of prediabetes and high cholesterol, presents with concerns about her recent lab results and a desire to improve her overall health. She expresses a particular interest in weight loss, but notes that current life stressors and a busy schedule pose significant challenges.  The patient recently saw a pulmonologist and is awaiting a home sleep study due to concerns about her sleep quality. She also mentions an upcoming appointment with a neurologist due to persistent, unexplained dizziness. The patient describes these episodes as feeling like a 'factory reset' in her brain, sometimes accompanied by a sensation of near fainting. She notes that these episodes often occur when her head is turned to the right or when exposed to certain lighting conditions.  Cardiology follow-up revealed no dangerous rhythm issues, with an echocardiogram showing good ejection fraction and valves. The patient wore a heart monitor for two weeks, which showed some non-sustained ventricular tachycardia (NSVT), but no other significant findings.  The patient's recent labs showed a HbA1c of 5.7, indicating prediabetes, and elevated cholesterol levels. She expresses concern about her high cholesterol due to a family history of heart disease and is considering starting Crestor. She reports trying to improve her diet, focusing on lean meats and vegetables, but acknowledges there is room for improvement.  The patient has also started [facility], a GLP-1 receptor agonist for weight loss, and reports a positive initial response. However, she had to pause the medication due to an unrelated eye issue. She expresses a desire to restart the medication and is aware  of the potential side effects, including nausea.  The patient is also under the care of an endocrinologist for thyroid issues, with a recent TSH of 1.51 on a dose of of thyroid medication. She reports no significant issues with her thyroid management at this time.  Finally, the patient mentions a history of a pinched nerve in her cervical spine, which she wonders might be related to her dizziness. She also expresses interest in seeing a dietitian to help manage her weight and cholesterol levels.        Review of Systems     Objective:   Physical Exam General-in no acute distress Eyes-no discharge Lungs-respiratory rate normal, CTA CV-no murmurs,RRR Extremities skin warm dry no edema Neuro grossly normal Behavior normal, alert        Assessment & Plan:  1. Light headed She will be seeing neurology for further delineation of this issue I would not recommend any additional medications Not orthostatic today Cardiology workup look normal Follow-up if ongoing problems or progression  2. Primary hypertension Blood pressure good control overall continue current measures healthy diet  3. Prediabetes Slight elevation A1c not worrisome healthy diet regular activity patient is interested in restarting Wegovy see discussion below  4. Mixed hyperlipidemia Moderate elevation of LDL but under guidelines does not have to start Crestor but she is considering it given her family history of heart disease we will relook at cholesterol profile in the springtime and make further decisions patient not candidate for coronary calcium  5. PCOS (polycystic ovarian syndrome) Stable  6. Morbid obesity (HCC) BMI 36.73 Patient interested in restarting Wegovy she states her insurance does cover it she  already has a few doses we will send in 0.25 recommend weekly patient familiar with side effects if severe side effects certainly stop medicine notify us otherwise give [place] feedback within 3 to 4  weeks and progression up to 0.5 Goal is to get weight down to 202 or less within the next 3 months Patient requests nutritionist consultation Follow-up if any other troubles  Recheck in 4 months

## 2023-06-22 ENCOUNTER — Other Ambulatory Visit: Payer: Self-pay | Admitting: Family Medicine

## 2023-06-30 ENCOUNTER — Ambulatory Visit: Payer: BC Managed Care – PPO | Admitting: Obstetrics & Gynecology

## 2023-06-30 ENCOUNTER — Encounter: Payer: Self-pay | Admitting: Obstetrics & Gynecology

## 2023-06-30 VITALS — BP 134/86 | HR 91 | Ht 64.0 in | Wt 215.5 lb

## 2023-06-30 DIAGNOSIS — Z01419 Encounter for gynecological examination (general) (routine) without abnormal findings: Secondary | ICD-10-CM

## 2023-06-30 DIAGNOSIS — Z975 Presence of (intrauterine) contraceptive device: Secondary | ICD-10-CM | POA: Diagnosis not present

## 2023-06-30 NOTE — Progress Notes (Signed)
Subjective:     Marissa Lane is a 32 y.o. female here for a routine exam.  No LMP recorded. (Menstrual status: IUD). [id number] Birth Control Method:  Mirena IUD Menstrual Calendar(currently): rare spotting  Current complaints: none.   Current acute medical issues:  hypothyroid   Recent Gynecologic History No LMP recorded. (Menstrual status: IUD). Last Pap: 03/2022,  normal Last mammogram: ,    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 52   GERD (gastroesophageal reflux disease)    Gestational diabetes    H/O urinary frequency 06/25/2013   Hypothyroidism    Irregular intermenstrual bleeding 10/12/2013   Lipoma    right scapula   MRSA infection    Obesity    Ovarian cancer (HCC)    Right   Palpitations    PCOS (polycystic ovarian syndrome)    Postpartum depression    Tobacco abuse     Past Surgical History:  Procedure Laterality Date   CHOLECYSTECTOMY  03/06/2012   Procedure: LAPAROSCOPIC CHOLECYSTECTOMY;  Surgeon: Fabio Bering, MD;  Location: [facility];  Service: General;  Laterality: N/A;   ESOPHAGOGASTRODUODENOSCOPY N/A 09/09/2012   Procedure: ESOPHAGOGASTRODUODENOSCOPY (EGD);  Surgeon: Malissa Hippo, MD;  Location: [facility];  Service: Endoscopy;  Laterality: N/A;  340   MASS EXCISION Right 03/23/2021   Procedure: EXCISION SOFT TISSUE MASS ON RIGHT SHOULDER;  Surgeon: Darnell Level, MD;  Location: [facility];  Service: General;  Laterality: Right;   MOUTH SURGERY     ROBOTIC ASSISTED LAPAROSCOPIC OVARIAN CYSTECTOMY Left 01/02/2021   Procedure: XI ROBOTIC ASSISTED LEFT LAPAROSCOPIC OVARIAN CYSTECTOMY;  Surgeon: Adolphus Birchwood, MD;  Location: [facility];  Service: Gynecology;  Laterality: Left;   ROBOTIC ASSISTED LAPAROSCOPIC OVARIAN CYSTECTOMY Left 11/05/2022   Procedure: XI ROBOTIC ASSISTED LAPAROSCOPIC LEFT OVARIAN  CYSTECTOMY;  Surgeon: Clide Cliff, MD;  Location: [facility];  Service: Gynecology;  Laterality: Left;   ROBOTIC ASSISTED SALPINGO OOPHERECTOMY Right 01/02/2021   Procedure: XI ROBOTIC ASSISTED RIGHT SALPINGO OOPHORECTOMY;  Surgeon: Adolphus Birchwood, MD;  Location: [facility];  Service: Gynecology;  Laterality: Right;   SKIN GRAFT  2009   gum graft   THYROIDECTOMY N/A 03/23/2021   Procedure: TOTAL THYROIDECTOMY;  Surgeon: Darnell Level, MD;  Location: [facility];  Service: General;  Laterality: N/A;   TONSILLECTOMY      OB History     Gravida  3   Para  3   Term  3   Preterm      AB      Living  3      SAB      IAB      Ectopic      Multiple  0   Live Births  3           Social History   Socioeconomic History   Marital status: Married    Spouse name: Marissa Lane   Number of children: 3   Years of education: Not on file   Highest education level: Associate degree: occupational, Scientist, product/process development, or vocational program  Occupational History   Occupation: LPN  Tobacco Use   Smoking status: Every Day    Current packs/day: 1.00    Average packs/day: 1 pack/day for 10.0 years (10.0 ttl pk-yrs)  Types: Cigarettes   Smokeless tobacco: Never  Vaping Use   Vaping status: Former  Substance and Sexual Activity   Alcohol use: No   Drug use: No   Sexual activity: Yes    Birth control/protection: I.U.D.  Other Topics Concern   Not on file  Social History Narrative   Not on file   Social Determinants of Health   Financial Resource Strain: Low Risk  (06/30/2023)   Overall Financial Resource Strain (CARDIA)    Difficulty of Paying Living Expenses: Not very hard  Food Insecurity: No Food Insecurity (06/30/2023)   Hunger Vital Sign    Worried About Running Out of Food in the Last Year: Never true    Ran Out of Food in the Last Year: Never true  Transportation Needs: No Transportation Needs (06/30/2023)   PRAPARE - Administrator, Civil Service (Medical): No    Lack of  Transportation (Non-Medical): No  Physical Activity: Inactive (06/30/2023)   Exercise Vital Sign    Days of Exercise per Week: 0 days    Minutes of Exercise per Session: 0 min  Stress: Stress Concern Present (06/30/2023)   [employer] of Occupational Health - Occupational Stress Questionnaire    Feeling of Stress : To some extent  Social Connections: Moderately Integrated (06/30/2023)   Social Connection and Isolation Panel [NHANES]    Frequency of Communication with Friends and Family: More than three times a week    Frequency of Social Gatherings with Friends and Family: Three times a week    Attends Religious Services: More than 4 times per year    Active Member of Clubs or Organizations: No    Attends Banker Meetings: Never    Marital Status: Married    Family History  Adopted: Yes  Problem Relation Age of Onset   Congestive Heart Failure Mother    Irritable bowel syndrome Mother    Ulcers Mother    Anemia Mother    Drug abuse Mother    Hypertension Brother    Colon cancer Maternal Grandmother 40   Anxiety disorder Maternal Grandmother    Heart disease Maternal Grandfather 40       MI     Current Outpatient Medications:    acetaminophen (TYLENOL) 500 MG tablet, Take 500 mg by mouth every 6 (six) hours as needed for moderate pain., Disp: , Rfl:    amLODipine (NORVASC) 2.5 MG tablet, Take 1 tablet by mouth once daily, Disp: 30 tablet, Rfl: 0   famotidine-calcium carbonate-magnesium hydroxide (PEPCID COMPLETE) 10-800-165 MG chewable tablet, Chew 1 tablet by mouth daily as needed (acid reflux)., Disp: , Rfl:    ibuprofen (ADVIL) 200 MG tablet, Take 200-400 mg by mouth every 6 (six) hours as needed for moderate pain., Disp: , Rfl:    levonorgestrel (MIRENA) 20 MCG/24HR IUD, 1 each by Intrauterine route once., Disp: , Rfl:    levothyroxine (SYNTHROID) 150 MCG tablet, Take 150 mcg by mouth every morning., Disp: , Rfl:    tretinoin (RETIN-A) 0.025 % cream, Apply  topically at bedtime., Disp: , Rfl:    Semaglutide-Weight Management (WEGOVY) 0.25 MG/0.5ML SOAJ, 0.25 weekly (Patient not taking: Reported on 06/30/2023), Disp: 2 mL, Rfl: 2  Review of Systems  Review of Systems  Constitutional: Negative for fever, chills, weight loss, malaise/fatigue and diaphoresis.  HENT: Negative for hearing loss, ear pain, nosebleeds, congestion, sore throat, neck pain, tinnitus and ear discharge.   Eyes: Negative for blurred vision, double vision, photophobia, pain, discharge  and redness.  Respiratory: Negative for cough, hemoptysis, sputum production, shortness of breath, wheezing and stridor.   Cardiovascular: Negative for chest pain, palpitations, orthopnea, claudication, leg swelling and PND.  Gastrointestinal: negative for abdominal pain. Negative for heartburn, nausea, vomiting, diarrhea, constipation, blood in stool and melena.  Genitourinary: Negative for dysuria, urgency, frequency, hematuria and flank pain.  Musculoskeletal: Negative for myalgias, back pain, joint pain and falls.  Skin: Negative for itching and rash.  Neurological: Negative for dizziness, tingling, tremors, sensory change, speech change, focal weakness, seizures, loss of consciousness, weakness and headaches.  Endo/Heme/Allergies: Negative for environmental allergies and polydipsia. Does not bruise/bleed easily.  Psychiatric/Behavioral: Negative for depression, suicidal ideas, hallucinations, memory loss and substance abuse. The patient is not nervous/anxious and does not have insomnia.        Objective:  Blood pressure 134/86, pulse 91, height 5\' 4"  (1.626 m), weight 215 lb 8 oz (97.8 kg).   Physical Exam  Vitals reviewed. Constitutional: She is oriented to person, place, and time. She appears well-developed and well-nourished.  HENT:  Head: Normocephalic and atraumatic.        Right Ear: External ear normal.  Left Ear: External ear normal.  Nose: Nose normal.  Mouth/Throat: Oropharynx  is clear and moist.  Eyes: Conjunctivae and EOM are normal. Pupils are equal, round, and reactive to light. Right eye exhibits no discharge. Left eye exhibits no discharge. No scleral icterus.  Neck: Normal range of motion. Neck supple. No tracheal deviation present. No thyromegaly present.  Cardiovascular: Normal rate, regular rhythm, normal heart sounds and intact distal pulses.  Exam reveals no gallop and no friction rub.   No murmur heard. Respiratory: Effort normal and breath sounds normal. No respiratory distress. She has no wheezes. She has no rales. She exhibits no tenderness.  GI: Soft. Bowel sounds are normal. She exhibits no distension and no mass. There is no tenderness. There is no rebound and no guarding.  Genitourinary:  Breasts no masses skin changes or nipple changes bilaterally      Vulva is normal without lesions Vagina is pink moist without discharge Cervix normal in appearance and pap is done Uterus is normal size shape and contour Adnexa is negative with normal sized ovaries  IUD strings are visible  Musculoskeletal: Normal range of motion. She exhibits no edema and no tenderness.  Neurological: She is alert and oriented to person, place, and time. She has normal reflexes. She displays normal reflexes. No cranial nerve deficit. She exhibits normal muscle tone. Coordination normal.  Skin: Skin is warm and dry. No rash noted. No erythema. No pallor.  Psychiatric: She has a normal mood and affect. Her behavior is normal. Judgment and thought content normal.       Medications Ordered at today's visit: No orders of the defined types were placed in this encounter.   Other orders placed at today's visit: No orders of the defined types were placed in this encounter.    ASSESSMENT + PLAN:    ICD-10-CM   1. Well woman exam with routine gynecological exam  Z01.419     2. IUD (intrauterine device) in place  Z97.5           Return in about 2 years (around  06/29/2025) for yearly.

## 2023-07-22 ENCOUNTER — Other Ambulatory Visit: Payer: Self-pay | Admitting: Family Medicine

## 2023-08-04 ENCOUNTER — Ambulatory Visit: Payer: BC Managed Care – PPO | Admitting: Nutrition

## 2023-08-04 ENCOUNTER — Encounter: Payer: Self-pay | Admitting: Family Medicine

## 2023-08-05 NOTE — Telephone Encounter (Signed)
 Lightheadedness and dizziness is a very complex issue that can sometimes be related to multitudes of issues including health issues for which she is trying to see specialist, can sometimes be due to medications, sometimes dietary, sometimes even stress related  Figuring this out is beyond what we can do via electronic message  I would recommend a follow-up visit either with myself or with Charmaine our PA in the relative near future thank you

## 2023-08-21 ENCOUNTER — Other Ambulatory Visit: Payer: Self-pay | Admitting: Family Medicine

## 2023-08-22 ENCOUNTER — Ambulatory Visit: Payer: BC Managed Care – PPO | Admitting: Nurse Practitioner

## 2023-09-02 ENCOUNTER — Encounter: Payer: BC Managed Care – PPO | Attending: Family Medicine | Admitting: Nutrition

## 2023-09-02 NOTE — Progress Notes (Signed)
Medical Nutrition Therapy  Appointment Start time:  1430  Appointment End time:  1530  Primary concerns today: Obeisty  Referral diagnosis: E66.01 Preferred learning style: No preference  Learning readiness: Ready   NUTRITION ASSESSMENT  33 yr old wfemale referred for obesity and desired weight loss. BMI 37 Currently on Wegovy. Has Hyperlipidemia. High risk for CVD and complications. 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 PCP Dr. Gerda Diss No weight loss since being on Wegovy. Admits to eating 1-2 times per day. Not eating full balanced meals. Snacking and eating late at night at times.   She is willing to work with Lifestyle Medicine in a more whole plant based predominant food intake and lifestyle.   Clinical Wt Readings from Last 3 Encounters:  09/08/23 218 lb (98.9 kg)  09/02/23 217 lb (98.4 kg)  06/30/23 215 lb 8 oz (97.8 kg)   Ht Readings from Last 3 Encounters:  09/08/23 5\' 4"  (1.626 m)  09/02/23 5\' 4"  (1.626 m)  06/30/23 5\' 4"  (1.626 m)   Body mass index is 37.25 kg/m. @BMIFA @ Facility age limit for growth %iles is 20 years. Facility age limit for growth %iles is 20 years.  Medical Hx:  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 2   GERD (gastroesophageal reflux disease)    Gestational diabetes    H/O urinary frequency 06/25/2013   Hypothyroidism    Irregular intermenstrual bleeding 10/12/2013   Lipoma    right scapula   MRSA infection    Obesity    Ovarian cancer (HCC)    Right   Palpitations    PCOS (polycystic ovarian syndrome)    Postpartum depression    Tobacco abuse     Medications:  Current Outpatient Medications on File Prior to Visit  Medication Sig Dispense Refill   acetaminophen (TYLENOL) 500 MG tablet Take 500 mg by mouth every 6 (six) hours as needed for moderate pain.     amLODipine (NORVASC) 2.5  MG tablet Take 1 tablet by mouth once daily 30 tablet 0   famotidine-calcium carbonate-magnesium hydroxide (PEPCID COMPLETE) 10-800-165 MG chewable tablet Chew 1 tablet by mouth daily as needed (acid reflux).     ibuprofen (ADVIL) 200 MG tablet Take 200-400 mg by mouth every 6 (six) hours as needed for moderate pain.     levonorgestrel (MIRENA) 20 MCG/24HR IUD 1 each by Intrauterine route once.     levothyroxine (SYNTHROID) 150 MCG tablet Take 150 mcg by mouth every morning.     Semaglutide-Weight Management (WEGOVY) 0.25 MG/0.5ML SOAJ 0.25 weekly 2 mL 2   tretinoin (RETIN-A) 0.025 % cream Apply topically at bedtime.     No current facility-administered medications on file prior to visit.    Labs:  Lab Results  Component Value Date   HGBA1C 5.7 (H) 05/21/2023      Latest Ref Rng & Units 05/21/2023    4:34 PM 12/31/2022    3:14 PM 12/28/2022    9:31 PM  CMP  Glucose 70 - 99 mg/dL 92  161  096   BUN 6 - 20 mg/dL 11  10  11    Creatinine 0.57 - 1.00 mg/dL 0.45  4.09  8.11   Sodium 134 - 144 mmol/L 139  139  135  Potassium 3.5 - 5.2 mmol/L 4.1  4.4  3.7   Chloride 96 - 106 mmol/L 103  104  104   CO2 20 - 29 mmol/L 22  19  22    Calcium 8.7 - 10.2 mg/dL 9.2   8.8    Lipid Panel     Component Value Date/Time   CHOL 241 (H) 05/21/2023 1634   TRIG 188 (H) 05/21/2023 1634   HDL 41 05/21/2023 1634   CHOLHDL 5.9 (H) 05/21/2023 1634   CHOLHDL 4.8 05/10/2014 0919   VLDL 41 (H) 05/10/2014 0919   LDLCALC 165 (H) 05/21/2023 1634   LABVLDL 35 05/21/2023 1634    Notable Signs/Symptoms: craves sweets  Smoke- less a pack a day.  Lifestyle & Dietary Hx Luves with husband LPN works FT  Estimated daily fluid intake: 80 oz Supplements: none Sleep: not good Stress / self-care: some Current average weekly physical activity: ADL  24-Hr Dietary Recall Eats 1-2 meals per day. Skips meals.  Eats late at times.  Estimated Energy Needs Calories: 1200 Carbohydrate: 135g Protein: 90g Fat:  33g   NUTRITION DIAGNOSIS  NI-1.7 Predicted excessive energy intake As related to Excessive calorie intake and inconsistent food intake .  As evidenced by BMI.   NUTRITION INTERVENTION  Nutrition education (E-1) on the following topics:  Nutrition and Pre Diabetes education provided on My Plate, CHO counting, meal planning, portion sizes, timing of meals, avoiding snacks between meals unless having a low blood sugar, target ranges for A1C and blood sugars, signs/symptoms and treatment of hyper/hypoglycemia, monitoring blood sugars, taking medications as prescribed, benefits of exercising 30 minutes per day and prevention of complications of DM.  Lifestyle Medicine  - Whole Food, Plant Predominant Nutrition is highly recommended: Eat Plenty of vegetables, Mushrooms, fruits, Legumes, Whole Grains, Nuts, seeds in lieu of processed meats, processed snacks/pastries red meat, poultry, eggs.    -It is better to avoid simple carbohydrates including: Cakes, Sweet Desserts, Ice Cream, Soda (diet and regular), Sweet Tea, Candies, Chips, Cookies, Store Bought Juices, Alcohol in Excess of  1-2 drinks a day, Lemonade,  Artificial Sweeteners, Doughnuts, Coffee Creamers, "Sugar-free" Products, etc, etc.  This is not a complete list.....  Exercise: If you are able: 30 -60 minutes a day ,4 days a week, or 150 minutes a week.  The longer the better.  Combine stretch, strength, and aerobic activities.  If you were told in the past that you have high risk for cardiovascular diseases, you may seek evaluation by your heart doctor prior to initiating moderate to intense exercise programs.   Handouts Provided Include  Lifestyle Medicine Know your numbers   Learning Style & Readiness for Change Teaching method utilized: Visual & Auditory  Demonstrated degree of understanding via: Teach Back  Barriers to learning/adherence to lifestyle change: none  Goals Established by Pt Goals  Eat three meals per day at  times discussed Focus on more fruits, vegetables and whole grains. Cut out processed foods, sodas, sugary foods and junk food Don't skip meals Walk 30 minutes or more a day. Lose 1 lb per week.   MONITORING & EVALUATION Dietary intake, weekly physical activity, and weight in 1 month.  Recommend to increase dose of Wegovy.  Next Steps  Patient is to work on meal planning and exercise.Marland Kitchen

## 2023-09-03 ENCOUNTER — Telehealth: Payer: Self-pay | Admitting: Genetic Counselor

## 2023-09-03 NOTE — Telephone Encounter (Signed)
Scheduled appointments per 2/12 scheduling message. Patient is aware of the scheduled appointments and is active on MyChart.

## 2023-09-08 ENCOUNTER — Encounter: Payer: Self-pay | Admitting: Nurse Practitioner

## 2023-09-08 ENCOUNTER — Ambulatory Visit: Payer: BC Managed Care – PPO | Admitting: Nurse Practitioner

## 2023-09-08 VITALS — BP 122/83 | HR 92 | Temp 97.3°F | Ht 64.0 in | Wt 218.0 lb

## 2023-09-08 DIAGNOSIS — R55 Syncope and collapse: Secondary | ICD-10-CM

## 2023-09-08 DIAGNOSIS — R42 Dizziness and giddiness: Secondary | ICD-10-CM | POA: Diagnosis not present

## 2023-09-08 DIAGNOSIS — F419 Anxiety disorder, unspecified: Secondary | ICD-10-CM | POA: Diagnosis not present

## 2023-09-10 ENCOUNTER — Encounter: Payer: Self-pay | Admitting: Nurse Practitioner

## 2023-09-10 NOTE — Progress Notes (Signed)
Subjective:    Patient ID: Marissa Lane, female    DOB: 01-17-1991, 33 y.o.   MRN: [id number]  HPI Presents to discuss her neurologic symptoms.  Note the patient has an appointment with neurology on 3/12.  Describes the sensation she has as pressure like blowing up a balloon in her head.  Feeling of lightheadedness.  When she feels a sensation she begins to panic which seems to make sensations worse.  Tries to calm down and take deep breaths and sits down at times.  Episodes last 10 to 15 minutes and occurs about 3-5 times per day.  Very intermittent and random.  Can occur with change in posture such as leaning down doing housework and then standing up.  The only trigger she has been able to identify is lack of sleep.  Denies any syncopal episodes.  No headaches.  Began last May.  Does not occur every day.  States her eye doctor mention nystagmus in the right eye at the last visit.  Indicated this was also noted at our office.  2 weeks ago began having a slight "flicker" in the right outer eye.  Very localized.  Occasional feeling of being in a tunnel with fading.  No numbness or weakness of the face arms or legs.  No difficulty speaking or swallowing.  Concerned because her husband has had some similar symptoms but not quite the same.  See previous note 06/17/2023.  Gets regular preventive health physicals.  Has Mirena for birth control.  Sees endocrinology, states her TSH has been stable. Note that she is not having symptoms during the office visit.  Review of Systems  Constitutional:  Positive for fatigue.  Respiratory:  Positive for chest tightness. Negative for cough, shortness of breath and wheezing.   Cardiovascular:  Positive for palpitations. Negative for chest pain.       Palpitations and chest tightness associated with her panic attacks.  Neurological:  Positive for light-headedness. Negative for syncope, facial asymmetry, speech difficulty, weakness and headaches.      09/08/2023     9:58 AM  Depression screen PHQ 2/9  Decreased Interest 0  Down, Depressed, Hopeless 1  PHQ - 2 Score 1  Altered sleeping 0  Tired, decreased energy 1  Change in appetite 0  Feeling bad or failure about yourself  2  Trouble concentrating 1  Moving slowly or fidgety/restless 0  Suicidal thoughts 0  PHQ-9 Score 5  Difficult doing work/chores Not difficult at all      09/08/2023    9:58 AM 06/30/2023   11:47 AM 06/17/2023    2:00 PM 01/27/2023    3:19 PM  GAD 7 : Generalized Anxiety Score  Nervous, Anxious, on Edge 2 2 2 1   Control/stop worrying 2 2 2 3   Worry too much - different things 2 2 2 3   Trouble relaxing 2 1 2 2   Restless 2 0 2 0  Easily annoyed or irritable 2 1 1 1   Afraid - awful might happen 1 1 2 2   Total GAD 7 Score 13 9 13 12   Anxiety Difficulty Somewhat difficult  Somewhat difficult Somewhat difficult   Social History   Tobacco Use   Smoking status: Every Day    Current packs/day: 1.00    Average packs/day: 1 pack/day for 10.0 years (10.0 ttl pk-yrs)    Types: Cigarettes   Smokeless tobacco: Never  Vaping Use   Vaping status: Former  Substance Use Topics   Alcohol use: No  Drug use: No        Objective:   Physical Exam NAD.  Alert, oriented.  Mildly anxious affect.  No facial asymmetry.  Speech clear.  Making good eye contact.  Dressed appropriately for the weather.  Lungs clear.  Heart regular rate rhythm. Today's Vitals   09/08/23 0942  BP: 122/83  Pulse: 92  Temp: (!) 97.3 F (36.3 C)  SpO2: 100%  Weight: 218 lb (98.9 kg)  Height: 5\' 4"  (1.626 m)   Body mass index is 37.42 kg/m.      Assessment & Plan:   Problem List Items Addressed This Visit       Cardiovascular and Mediastinum   Postural dizziness with presyncope - Primary (Chronic)     Other   Anxiety   Defers medication for anxiety at this time, feels it is mainly triggered by the spells.  Patient understands that anxiety may be contributing to some of her  symptoms. Follow-up with neurology as planned next month. Warning signs reviewed.  Call back sooner if new or worsening symptoms.

## 2023-09-11 ENCOUNTER — Encounter: Payer: Self-pay | Admitting: Nutrition

## 2023-09-11 NOTE — Patient Instructions (Signed)
Goals  Eat three meals per day at times discussed Focus on more fruits, vegetables and whole grains. Cut out processed foods, sodas, sugary foods and junk food Don't skip meals Walk 30 minutes or more a day. Lose 1 lb per week.

## 2023-09-12 ENCOUNTER — Telehealth: Payer: Self-pay

## 2023-09-12 ENCOUNTER — Encounter: Payer: Self-pay | Admitting: Family Medicine

## 2023-09-12 NOTE — Telephone Encounter (Signed)
Communication  Reason for CRM: Patient stated that provider asked her to callback if there are any changes from the ongoing issue. Patient is experiencing like a smudge in her right eye peripheral. She stated that it has not gone away but it has gotten bigger. Patient callback is [phone].

## 2023-09-13 ENCOUNTER — Emergency Department (HOSPITAL_COMMUNITY): Payer: BC Managed Care – PPO

## 2023-09-13 ENCOUNTER — Encounter (HOSPITAL_COMMUNITY): Payer: Self-pay | Admitting: *Deleted

## 2023-09-13 ENCOUNTER — Emergency Department (HOSPITAL_COMMUNITY)
Admission: EM | Admit: 2023-09-13 | Discharge: 2023-09-13 | Disposition: A | Discharge status: Home / Self Care | Payer: BC Managed Care – PPO | Attending: Emergency Medicine | Admitting: Emergency Medicine

## 2023-09-13 ENCOUNTER — Other Ambulatory Visit: Payer: Self-pay

## 2023-09-13 DIAGNOSIS — Z79899 Other long term (current) drug therapy: Secondary | ICD-10-CM | POA: Insufficient documentation

## 2023-09-13 DIAGNOSIS — H539 Unspecified visual disturbance: Secondary | ICD-10-CM

## 2023-09-13 DIAGNOSIS — H538 Other visual disturbances: Secondary | ICD-10-CM | POA: Insufficient documentation

## 2023-09-13 LAB — COMPREHENSIVE METABOLIC PANEL
ALT: 31 U/L (ref 0–44)
AST: 24 U/L (ref 15–41)
Albumin: 3.7 g/dL (ref 3.5–5.0)
Alkaline Phosphatase: 57 U/L (ref 38–126)
Anion gap: 9 (ref 5–15)
BUN: 11 mg/dL (ref 6–20)
CO2: 23 mmol/L (ref 22–32)
Calcium: 8.9 mg/dL (ref 8.9–10.3)
Chloride: 107 mmol/L (ref 98–111)
Creatinine, Ser: 0.64 mg/dL (ref 0.44–1.00)
GFR, Estimated: >60 mL/min (ref >60)
Glucose, Bld: 86 mg/dL (ref 70–99)
Potassium: 3.8 mmol/L (ref 3.5–5.1)
Sodium: 139 mmol/L (ref 135–145)
Total Bilirubin: 0.5 mg/dL (ref 0.0–1.2)
Total Protein: 7.2 g/dL (ref 6.5–8.1)

## 2023-09-13 LAB — CBC WITH DIFFERENTIAL/PLATELET
Abs Immature Granulocytes: 0.03 10*3/uL (ref 0.00–0.07)
Basophils Absolute: 0 10*3/uL (ref 0.0–0.1)
Basophils Relative: 0 %
Eosinophils Absolute: 0.2 10*3/uL (ref 0.0–0.5)
Eosinophils Relative: 3 %
HCT: 42.2 % (ref 36.0–46.0)
Hemoglobin: 14.5 g/dL (ref 12.0–15.0)
Immature Granulocytes: 0 %
Lymphocytes Relative: 34 %
Lymphs Abs: 2.6 10*3/uL (ref 0.7–4.0)
MCH: 30.9 pg (ref 26.0–34.0)
MCHC: 34.4 g/dL (ref 30.0–36.0)
MCV: 90 fL (ref 80.0–100.0)
Monocytes Absolute: 0.6 10*3/uL (ref 0.1–1.0)
Monocytes Relative: 7 %
Neutro Abs: 4.1 10*3/uL (ref 1.7–7.7)
Neutrophils Relative %: 56 %
Platelets: 327 10*3/uL (ref 150–400)
RBC: 4.69 MIL/uL (ref 3.87–5.11)
RDW: 12.1 % (ref 11.5–15.5)
WBC: 7.5 10*3/uL (ref 4.0–10.5)
nRBC: 0 % (ref 0.0–0.2)

## 2023-09-13 LAB — VITAMIN B12: Vitamin B-12: 474 pg/mL (ref 180–914)

## 2023-09-13 MED ORDER — GADOBUTROL 1 MMOL/ML IV SOLN
10.0000 mL | Freq: Once | INTRAVENOUS | Status: AC | PRN
  Administered 2023-09-13: 10 mL via INTRAVENOUS

## 2023-09-13 MED ORDER — LORAZEPAM 2 MG/ML IJ SOLN
1.0000 mg | Freq: Once | INTRAMUSCULAR | Status: AC
  Administered 2023-09-13: 1 mg via INTRAVENOUS
  Filled 2023-09-13: qty 1

## 2023-09-13 NOTE — Telephone Encounter (Signed)
 Nurses-I saw this message after hours I sent her a MyChart message I would recommend the nurses call her.  If her vision issues getting worse I recommend consultation with optometry

## 2023-09-13 NOTE — ED Provider Notes (Signed)
 [place] EMERGENCY DEPARTMENT AT [facility] Provider Note   CSN: [id number] Arrival date & time: 09/13/23  1559    History  Chief Complaint  Patient presents with   Eye Problem    Marissa Lane is a 33 y.o. female 2 weeks ago, flickering (appears to look like static) to RLQ in her right eye, yesterday developed "smudge" in her center vision. Has not resolved. Has been having HA, near syncope episodes. Followed with Cards who started not cardiac. Now supposed to be seeing Neuro. Has appointment  soon. Her NA, near syncope, dizziness, has been occurring for about a year. Also with intermittent weakness. No recent illness.  Personal or family history of demyelinating disease.  No fever, neck stiffness.  No drainage to eyes, eye pain. No trauma to eyes.   HPI     Home Medications Prior to Admission medications   Medication Sig Start Date End Date Taking? Authorizing Provider  acetaminophen (TYLENOL) 500 MG tablet Take 500 mg by mouth every 6 (six) hours as needed for moderate pain.    [provider]  amLODipine (NORVASC) 2.5 MG tablet Take 1 tablet by mouth once daily 08/21/23   Babs Sciara, MD  famotidine-calcium carbonate-magnesium hydroxide (PEPCID COMPLETE) 10-800-165 MG chewable tablet Chew 1 tablet by mouth daily as needed (acid reflux).    [provider]  ibuprofen (ADVIL) 200 MG tablet Take 200-400 mg by mouth every 6 (six) hours as needed for moderate pain.    [provider]  levonorgestrel (MIRENA) 20 MCG/24HR IUD 1 each by Intrauterine route once.    [provider]  levothyroxine (SYNTHROID) 150 MCG tablet Take 150 mcg by mouth every morning. 06/24/23   [provider]  Semaglutide-Weight Management (WEGOVY) 0.25 MG/0.5ML SOAJ 0.25 weekly 06/17/23   Babs Sciara, MD  tretinoin (RETIN-A) 0.025 % cream Apply topically at bedtime.    [provider]     Allergies    Other, Bupropion, Cymbalta [duloxetine  hcl], Oxycodone, Silicone, Vicodin [hydrocodone-acetaminophen], and Zofran [ondansetron hcl]    Review of Systems   Review of Systems  Constitutional: Negative.   HENT: Negative.    Eyes:  Positive for visual disturbance. Negative for photophobia, pain, discharge, redness and itching.  Respiratory: Negative.    Cardiovascular: Negative.   Gastrointestinal: Negative.   Genitourinary: Negative.   Musculoskeletal: Negative.   Neurological:  Positive for dizziness (intermittent x year), weakness and headaches. Tremors: intermittent x year. Facial asymmetry: intermittent. All other systems reviewed and are negative.  Physical Exam Updated Vital Signs BP 126/84   Pulse 75   Temp 97.7 F (36.5 C) (Oral)   Resp 16   Ht 5\' 4"  (1.626 m)   Wt 98.9 kg   LMP 09/13/2023   SpO2 100%   BMI 37.43 kg/m  Physical Exam Physical Exam  Constitutional: Pt is oriented to person, place, and time. Pt appears well-developed and well-nourished. No distress.  HENT:  Head: Normocephalic and atraumatic.  Mouth/Throat: Oropharynx is clear and moist.  Eyes: Conjunctivae and EOM are normal. Pupils are equal, round, and reactive to light. No scleral icterus. IOP 17. No ulceration abrasion, Vision WNL BIL No horizontal, vertical or rotational nystagmus  Neck: Normal range of motion. Neck supple.  Full active and passive ROM without pain No midline or paraspinal tenderness No nuchal rigidity or meningeal signs  Cardiovascular: Normal rate, regular rhythm and intact distal pulses.   Pulmonary/Chest: Effort normal and breath sounds normal. No respiratory distress. Pt  has no wheezes. No rales.  Abdominal: Soft. Bowel sounds are normal. There is no tenderness. There is no rebound and no guarding.  Musculoskeletal: Normal range of motion.  Neurological: Pt. is alert and oriented to person, place, and time. He has normal reflexes. No cranial nerve deficit.  Exhibits normal muscle tone. Coordination normal.  Mental  Status:  Alert, oriented, thought content appropriate. Speech fluent without evidence of aphasia. Able to follow 2 step commands without difficulty.  Cranial Nerves:  CN 2-12 grossly intact Motor:  Equal strength Sensory:intact sensation Cerebellar: normal finger-to-nose with bilateral upper extremities Gait: normal gait and balance CV: distal pulses palpable throughout   Skin: Skin is warm and dry. No rash noted. Pt is not diaphoretic.  Psychiatric: Pt has a normal mood and affect. Behavior is normal. Judgment and thought content normal.  Nursing note and vitals reviewed.  ED Results / Procedures / Treatments   Labs (all labs ordered are listed, but only abnormal results are displayed) Labs Reviewed  COMPREHENSIVE METABOLIC PANEL  CBC WITH DIFFERENTIAL/PLATELET  VITAMIN B12   EKG EKG Interpretation Date/Time:  Saturday September 13 2023 16:34:37 EST Ventricular Rate:  83 PR Interval:  160 QRS Duration:  92 QT Interval:  356 QTC Calculation: 418 R Axis:   68  Text Interpretation: Normal sinus rhythm no acute ST/T changes no significant change since May 2024 Confirmed by Pricilla Loveless [phone]) on 09/13/2023 7:47:10 PM  Radiology MR Brain W and Wo Contrast Result Date: 09/13/2023 CLINICAL DATA:  Optic neuritis suspected EXAM: MRI HEAD AND ORBITS WITHOUT AND WITH CONTRAST TECHNIQUE: Multiplanar, multiecho pulse sequences of the brain and surrounding structures were obtained without and with intravenous contrast. Multiplanar, multiecho pulse sequences of the orbits and surrounding structures were obtained including fat saturation techniques, before and after intravenous contrast administration. CONTRAST:  10mL GADAVIST GADOBUTROL 1 MMOL/ML IV SOLN COMPARISON:  None Available. FINDINGS: MRI HEAD FINDINGS Brain: No acute infarction, hemorrhage, hydrocephalus, extra-axial collection or mass lesion. Vascular: Normal flow voids. Skull and upper cervical spine: Normal marrow signal. Other:  None. MRI ORBITS FINDINGS Orbits: No traumatic or inflammatory finding. Globes, optic nerves, orbital fat, extraocular muscles, vascular structures, and lacrimal glands are normal. Visualized sinuses: Clear. Soft tissues: Negative. IMPRESSION: Normal MRI of the brain and orbits. Electronically Signed   By: Deatra Robinson M.D.   On: 09/13/2023 21:24   MR ORBITS W WO CONTRAST Result Date: 09/13/2023 CLINICAL DATA:  Optic neuritis suspected EXAM: MRI HEAD AND ORBITS WITHOUT AND WITH CONTRAST TECHNIQUE: Multiplanar, multiecho pulse sequences of the brain and surrounding structures were obtained without and with intravenous contrast. Multiplanar, multiecho pulse sequences of the orbits and surrounding structures were obtained including fat saturation techniques, before and after intravenous contrast administration. CONTRAST:  10mL GADAVIST GADOBUTROL 1 MMOL/ML IV SOLN COMPARISON:  None Available. FINDINGS: MRI HEAD FINDINGS Brain: No acute infarction, hemorrhage, hydrocephalus, extra-axial collection or mass lesion. Vascular: Normal flow voids. Skull and upper cervical spine: Normal marrow signal. Other: None. MRI ORBITS FINDINGS Orbits: No traumatic or inflammatory finding. Globes, optic nerves, orbital fat, extraocular muscles, vascular structures, and lacrimal glands are normal. Visualized sinuses: Clear. Soft tissues: Negative. IMPRESSION: Normal MRI of the brain and orbits. Electronically Signed   By: Deatra Robinson M.D.   On: 09/13/2023 21:24   CT Head Wo Contrast Result Date: 09/13/2023 CLINICAL DATA:  Mental status change, unknown cause EXAM: CT HEAD WITHOUT CONTRAST TECHNIQUE: Contiguous axial images were obtained from the base of the skull through the vertex  without intravenous contrast. RADIATION DOSE REDUCTION: This exam was performed according to the departmental dose-optimization program which includes automated exposure control, adjustment of the mA and/or kV according to patient size and/or use of  iterative reconstruction technique. COMPARISON:  08/22/2010 FINDINGS: Brain: No evidence of acute infarction, hemorrhage, hydrocephalus, extra-axial collection or mass lesion/mass effect. Vascular: No hyperdense vessel or unexpected calcification. Skull: Normal. Negative for fracture or focal lesion. Sinuses/Orbits: No acute finding. Other: None. IMPRESSION: No acute intracranial abnormality. Electronically Signed   By: Duanne Guess D.O.   On: 09/13/2023 17:32    Procedures Ultrasound ED Ocular  Date/Time: 09/13/2023 8:07 PM  Performed by: Ralph Leyden A, PA-C Authorized by: Ralph Leyden A, PA-C   PROCEDURE DETAILS:    Indications: eye pain and visual change     Assessed:  Right eye   Right eye axial view: obtained     Right eye sagittal view: obtained     Images: archived     Limitations:  None RIGHT EYE FINDINGS:     no foreign body noted in right eye    right eye lens not dislodged    no right eye increased optic nerve sheath diameter    no evidence of retinal detachment of the right eye    no vitreous hemorrhage in right eye     Medications Ordered in ED Medications  LORazepam (ATIVAN) injection 1 mg (1 mg Intravenous Given 09/13/23 2027)  gadobutrol (GADAVIST) 1 MMOL/ML injection 10 mL (10 mLs Intravenous Contrast Given 09/13/23 2052)  gadobutrol (GADAVIST) 1 MMOL/ML injection 10 mL (10 mLs Intravenous Contrast Given 09/13/23 2053)   ED Course/ Medical Decision Making/ A&P 33 year old here for evaluation of abnormal vision changes.  Noted for the last few weeks she has had "static" to her right side of her vision.  Yesterday developed black spots in the center of her eye.  She has a nonfocal neuroexam without deficits.  Sounds like she has been having vague intermittent "neurologic" symptoms since last spring.  She is supposed to follow-up with neurology for these.  Will plan on labs imaging and reassess  Bedside ultrasound performed myself on right eye does not show  any acute abnormality  Labs and imaging personally viewed and interpreted: CBC without leukocytosis Metabolic panel without significant abnormality B12 normal CT head without significant abnormality EKG without ischemic changes MRI brain and orbits with and without without significant abnormality  Discussed results with patient.  Will have her follow-up with ophthalmology.  Symptoms not consistent with CRAO, demyelinating process, acute angle glaucoma, giant cell arteritis, IIH, mass, dissection, uveitis, iritis, corneal ulceration, abrasion.                                 Medical Decision Making Amount and/or Complexity of Data Reviewed External Data Reviewed: labs, radiology and notes. Labs: ordered. Decision-making details documented in ED Course. Radiology: ordered and independent interpretation performed. Decision-making details documented in ED Course.  Risk OTC drugs. Prescription drug management. Decision regarding hospitalization. Diagnosis or treatment significantly limited by social determinants of health.         Final Clinical Impression(s) / ED Diagnoses Final diagnoses:  Vision changes    Rx / DC Orders ED Discharge Orders     None         Lateefa Crosby A, PA-C 09/13/23 2330    Pricilla Loveless, MD 09/17/23 1101

## 2023-09-13 NOTE — ED Provider Triage Note (Signed)
 Emergency Medicine Provider Triage Evaluation Note  Marissa Lane , a 33 y.o. female  was evaluated in triage.  Pt complains of vague neurological complaints.  Review of Systems  Positive:  Negative:   Physical Exam  BP (!) 150/113   Pulse 92   Temp 98.2 F (36.8 C)   Resp 18   Ht 5\' 4"  (1.626 m)   Wt 98.9 kg   LMP 09/13/2023   SpO2 100%   BMI 37.43 kg/m  Gen:   Awake, no distress   Resp:  Normal effort  MSK:   Moves extremities without difficulty  Other:    Medical Decision Making  Medically screening exam initiated at 4:25 PM.  Appropriate orders placed.  Marissa Lane was informed that the remainder of the evaluation will be completed by another provider, this initial triage assessment does not replace that evaluation, and the importance of remaining in the ED until their evaluation is complete.  Weird, vague intermittent "neurological" symptoms over the past year. Apt with neuro in March. Endorses times where it feels like her legs hurts as if  she worked out - but she did not. Also with intermittent vision "flickering" and blurring. Also with intermittent pre-syncopal episodes. Patient stating that these symptoms are giving her anxiety. PCP has not ordered any head imaging because they want to wait and see what neuro says.   Marissa Lane, [place] 09/13/23 [phone]

## 2023-09-13 NOTE — ED Triage Notes (Signed)
 2 weeks ago the pt had a flickering in her rt eye  today while driving she had a smudge in her rt eye and it has not gone away she also  has had nausea  she has had weird neurological symptoms over the last year and she has an appointment with neuro  in march  lmp now  iud

## 2023-09-13 NOTE — Discharge Instructions (Addendum)
 It was a pleasure taking care of you here in the emergency department.  Your labs and imaging were reassuring.  I have placed the number to the ophthalmologist, the eye doctor in your discharge paperwork.  Please call 8 AM Monday morning to be seen on Monday.  Return if your symptoms worsen. Keep your follow-up appoint with your neurologist

## 2023-09-13 NOTE — ED Notes (Signed)
 Patient transported to MRI

## 2023-09-16 ENCOUNTER — Ambulatory Visit: Payer: Self-pay | Admitting: Family Medicine

## 2023-09-16 NOTE — Telephone Encounter (Signed)
 FYI

## 2023-09-16 NOTE — Telephone Encounter (Signed)
 Chief Complaint: Anxiety Symptoms: overwhelmed with heightened anxiety Frequency: symptoms became heightened today Pertinent Negatives: Patient denies SI, HI Disposition: [] ED /[] Urgent Care (no appt availability in office) / [x] Appointment(In office/virtual)/ []  [facility]/ [] Home Care/ [] Refused Recommended Disposition /[] [place] [facility]/ []  Follow-up with PCP Additional Notes: patient with hx of anxiety called with concerns for heightened anxiety today. Patient states she was told she had cyclical anxiety. Patient endorses having been on Prozac for a period of time. Patient states anxiety feels like peaked today-states her medical problems along with her husbands may have caused increase in anxiety today. Patient is questioning if she could go back on medication. Per protocol, appointment is the recommendation. Appointment made with PCP for 09/17/2023 at 4:10 pm. Patient verbalized understanding and all questions answered.     Copied from CRM [id number]. Topic: Clinical - Red Word Triage >> Sep 16, 2023  1:08 PM Marissa Lane wrote: Red Word that prompted transfer to Nurse Triage: Heightened anxiety, overwhelmed Reason for Disposition  Patient sounds very upset or troubled to the triager  Answer Assessment - Initial Assessment Questions 1. CONCERN: "Did anything happen that prompted you to call today?"      Anxiety has been building up-health issues with herself and husband. Patient states she has cyclic anxiety. 2. ANXIETY SYMPTOMS: "Can you describe how you (your loved one; patient) have been feeling?" (e.g., tense, restless, panicky, anxious, keyed up, overwhelmed, sense of impending doom).      Feels like she is going to have a panic attack, anxious 3. ONSET: "How long have you been feeling this way?" (e.g., hours, days, weeks)     Started today 4. SEVERITY: "How would you rate the level of anxiety?" (e.g., 0 - 10; or mild, moderate, severe).     moderate 5. FUNCTIONAL  IMPAIRMENT: "How have these feelings affected your ability to do daily activities?" "Have you had more difficulty than usual doing your normal daily activities?" (e.g., getting better, same, worse; self-care, school, work, interactions)     Patient states "I can push through." 6. HISTORY: "Have you felt this way before?" "Have you ever been diagnosed with an anxiety problem in the past?" (e.g., generalized anxiety disorder, panic attacks, PTSD). If Yes, ask: "How was this problem treated?" (e.g., medicines, counseling, etc.)     Yes-has been on medications before 7. RISK OF HARM - SUICIDAL IDEATION: "Do you ever have thoughts of hurting or killing yourself?" If Yes, ask:  "Do you have these feelings now?" "Do you have a plan on how you would do this?"     No 8. TREATMENT:  "What has been done so far to treat this anxiety?" (e.g., medicines, relaxation strategies). "What has helped?"     Medication-yes 9. TREATMENT - THERAPIST: "Do you have a counselor or therapist? Name?"     N/A 10. POTENTIAL TRIGGERS: "Do you drink caffeinated beverages (e.g., coffee, colas, teas), and how much daily?" "Do you drink alcohol or use any drugs?" "Have you started any new medicines recently?"       Drinks 1 cup of coffee a day. No alcohol or drugs.  11. PATIENT SUPPORT: "Who is with you now?" "Who do you live with?" "Do you have family or friends who you can talk to?"        Lives with husband and has a support system 12. OTHER SYMPTOMS: "Do you have any other symptoms?" (e.g., feeling depressed, trouble concentrating, trouble sleeping, trouble breathing, palpitations or fast heartbeat, chest pain,  sweating, nausea, or diarrhea)       No 13. PREGNANCY: "Is there any chance you are pregnant?" "When was your last menstrual period?"       No  Protocols used: Anxiety and Panic Attack-A-AH

## 2023-09-17 ENCOUNTER — Ambulatory Visit: Payer: BC Managed Care – PPO | Admitting: Family Medicine

## 2023-09-17 ENCOUNTER — Encounter: Payer: Self-pay | Admitting: Family Medicine

## 2023-09-17 VITALS — BP 128/86 | HR 102 | Temp 98.2°F | Ht 64.0 in | Wt 215.0 lb

## 2023-09-17 DIAGNOSIS — F40240 Claustrophobia: Secondary | ICD-10-CM | POA: Insufficient documentation

## 2023-09-17 DIAGNOSIS — F411 Generalized anxiety disorder: Secondary | ICD-10-CM | POA: Diagnosis not present

## 2023-09-17 MED ORDER — FLUOXETINE HCL 10 MG PO CAPS: 10.0000 mg | ORAL_CAPSULE | Freq: Every day | ORAL | 3 refills | Status: DC

## 2023-09-17 MED ORDER — ALPRAZOLAM 0.5 MG PO TABS: ORAL_TABLET | ORAL | 0 refills | Status: AC

## 2023-09-17 NOTE — Progress Notes (Addendum)
   Subjective:    Patient ID: Marissa Lane, female    DOB: [birth year], 33 y.o.   MRN: [id number]  HPI Patient going through some very stressful issues She has her own health issues that regarding some peripheral vision problems that they are trying to figure out is it related to optic nerve or possibly something with the retina she is seeing 1 eye specialist she is scheduled to see another she had an MRI of the brain and orbits which came back negative  Her husband is also dealing with a pituitary tumor may end up needing to have surgery She also has a child has autism and also another child with hyperactivity She the patient finds herself feeling anxious nervous sad worried but denies being depressed just more very anxious   Review of Systems     Objective:   Physical Exam  Full discussion today no physical exam patient does not appear to be in any distress  25 minutes was spent with the patient the time spent with the patient is spent discussing what is going on with her as well as treatment options and putting forth a plan of action additional several minutes was spent documenting    Assessment & Plan:  1. GAD (generalized anxiety disorder) (Primary) Patient not suicidal Patient states she will try to get some counseling through her workplace if her insurance gives at a low cost We did discuss expectations with medicine If any setbacks or problems to notify us Patient not suicidal Coping mechanisms discussed Follow-up in approximately 3 to 4 months follow-up sooner if any problems Give [place] feedback in 2 to 3 weeks how things are going  - ALPRAZolam (XANAX) 0.5 MG tablet; 1/2 to 1 bid prn panic attack caution drowsiness  Dispense: 6 tablet; Refill: 0 - FLUoxetine (PROZAC) 10 MG capsule; Take 1 capsule (10 mg total) by mouth daily.  Dispense: 30 capsule; Refill: 3 Patient

## 2023-09-20 ENCOUNTER — Other Ambulatory Visit: Payer: Self-pay | Admitting: Family Medicine

## 2023-09-25 ENCOUNTER — Encounter: Payer: Self-pay | Admitting: Family Medicine

## 2023-09-29 ENCOUNTER — Ambulatory Visit: Admitting: Adult Health

## 2023-09-29 ENCOUNTER — Encounter: Payer: Self-pay | Admitting: Adult Health

## 2023-09-29 VITALS — BP 132/86 | HR 88 | Ht 64.0 in | Wt 214.0 lb

## 2023-09-29 DIAGNOSIS — R1032 Left lower quadrant pain: Secondary | ICD-10-CM | POA: Insufficient documentation

## 2023-09-29 DIAGNOSIS — T8332XA Displacement of intrauterine contraceptive device, initial encounter: Secondary | ICD-10-CM

## 2023-09-29 NOTE — Progress Notes (Signed)
  Subjective:     Patient ID: Marissa Lane, female   DOB: 26-Nov-1990, 33 y.o.   MRN: [id number]  HPI Marissa Lane is a 33  year old white female, married, [id number], in complaining of LLQ cramping for about 3 months and now it is stabbing pain. Has constipation at times     Component Value Date/Time   DIAGPAP  03/28/2022 1333    - Negative for intraepithelial lesion or malignancy (NILM)   DIAGPAP  04/27/2019 1401    - Negative for intraepithelial lesion or malignancy (NILM)   DIAGPAP (A) 01/08/2018 0000    ATYPICAL SQUAMOUS CELLS OF UNDETERMINED SIGNIFICANCE (ASC-US).   HPVHIGH Negative 03/28/2022 1333   HPVHIGH Negative 04/27/2019 1401   ADEQPAP  03/28/2022 1333    Satisfactory for evaluation; transformation zone component ABSENT.   ADEQPAP  04/27/2019 1401    Satisfactory for evaluation; transformation zone component ABSENT.   ADEQPAP (A) 01/08/2018 0000    Satisfactory for evaluation  endocervical/transformation zone component PRESENT.   PCP is Dr Gerda Diss   Review of Systems + LLQ cramping for about 3 months and now it is stabbing pain.  +constipation at times   +pain pain with sex if deep Reviewed past medical,surgical, social and family history. Reviewed medications and allergies.  Objective:   Physical Exam BP 132/86 (BP Location: Left Arm, Patient Position: Sitting, Cuff Size: Normal)   Pulse 88   Ht 5\' 4"  (1.626 m)   Wt 214 lb (97.1 kg)   LMP 09/13/2023   BMI 36.73 kg/m     Skin warm and dry.Pelvic: external genitalia is normal in appearance no lesions, vagina:pink, urethra has no lesions or masses noted, cervix: bulbous,everted at os, no IUD strings seen, uterus: normal size, shape and contour, non tender, no masses felt, adnexa: no masses or tenderness noted. Bladder is non tender and no masses felt.  Fall risk is low  [facility] - 09/29/23 1529       Pregnancy Intention Screening   Does the patient want to become pregnant in the next year? No    Does the patient's  partner want to become pregnant in the next year? No    Would the patient like to discuss contraceptive options today? No      Contraception Wrap Up   Current Method IUD or IUS    End Method IUD or IUS    Contraception Counseling Provided Yes            Examination chaperoned by Malachy Mood LPN  Assessment:     1. LLQ pain (Primary) +LLQ cramping for about 3 months and now it is stabbing pain. Will get pelvic US in office to assess uterus and left ovary  - US PELVIC COMPLETE WITH TRANSVAGINAL; Future  2. Intrauterine contraceptive device threads lost, initial encounter No strings seen Will check placement on [place] - US PELVIC COMPLETE WITH TRANSVAGINAL; Future     Plan:     Return 10/16/23 for pelvic US in office

## 2023-09-30 ENCOUNTER — Telehealth: Payer: Self-pay | Admitting: Diagnostic Neuroimaging

## 2023-09-30 ENCOUNTER — Ambulatory Visit
Admission: RE | Admit: 2023-09-30 | Discharge: 2023-09-30 | Disposition: A | Discharge status: Home / Self Care | Source: Ambulatory Visit | Attending: Nurse Practitioner | Admitting: Nurse Practitioner

## 2023-09-30 VITALS — BP 109/76 | HR 98 | Temp 98.7°F | Resp 20

## 2023-09-30 DIAGNOSIS — J111 Influenza due to unidentified influenza virus with other respiratory manifestations: Secondary | ICD-10-CM | POA: Diagnosis present

## 2023-09-30 DIAGNOSIS — J029 Acute pharyngitis, unspecified: Secondary | ICD-10-CM | POA: Insufficient documentation

## 2023-09-30 LAB — POC COVID19/FLU A&B COMBO
Covid Antigen, POC: NEGATIVE
Influenza A Antigen, POC: NEGATIVE
Influenza B Antigen, POC: NEGATIVE

## 2023-09-30 LAB — POCT RAPID STREP A (OFFICE): Rapid Strep A Screen: NEGATIVE

## 2023-09-30 MED ORDER — FLUTICASONE PROPIONATE 50 MCG/ACT NA SUSP: 2.0000 | Freq: Every day | NASAL | 0 refills | Status: DC

## 2023-09-30 MED ORDER — LIDOCAINE VISCOUS HCL 2 % MT SOLN: OROMUCOSAL | 0 refills | Status: DC

## 2023-09-30 NOTE — ED Triage Notes (Signed)
 Pt reports she has a sore throat, chills, fever, and bilateral ear pain x 1 day

## 2023-09-30 NOTE — ED Provider Notes (Signed)
 [facility]    CSN: [id number] Arrival date & time: 09/30/23  1203      History   Chief Complaint Chief Complaint  Patient presents with   Sore Throat    Sore throat, ear pain, shivering, - Entered by patient    HPI Marissa Lane is a 33 y.o. female.   The history is provided by the patient.   Patient presents with a 1 day history of sore throat, chills, fever, nasal congestion, and bilateral ear pain.  Tmax around 101.  Denies headache, ear drainage, cough, abdominal pain, nausea, vomiting, diarrhea, or rash.  Patient reports she has been taking over-the-counter Tylenol and ibuprofen for symptoms.  Patient's daughter is sick with the same or similar symptoms.  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 66   GERD (gastroesophageal reflux disease)    Gestational diabetes    H/O urinary frequency 06/25/2013   Hypothyroidism    Irregular intermenstrual bleeding 10/12/2013   Lipoma    right scapula   MRSA infection    Obesity    Ovarian cancer (HCC)    Right   Palpitations    PCOS (polycystic ovarian syndrome)    Postpartum depression    Tobacco abuse     Patient Active Problem List   Diagnosis Date Noted   IUD threads lost 09/29/2023   LLQ pain 09/29/2023   Claustrophobia 09/17/2023   Postural dizziness with presyncope 01/06/2023   Palpitations 01/06/2023   Prediabetes 11/15/2022   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   Mass of soft tissue of shoulder 03/20/2021   Papillary thyroid carcinoma (HCC) 03/20/2021   Lipoma of back 01/18/2021   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   Cyst of right ovary 01/02/2021   Cyst of ovary, left 11/02/2020   Encounter for IUD insertion 11/02/2020   Pregnancy examination or test, negative result 11/02/2020   GAD (generalized anxiety  disorder) 08/24/2019   Attention deficit hyperactivity disorder (ADHD), 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   Cervical high risk HPV (human papillomavirus) test positive 01/09/2017   History of gestational diabetes 07/19/2016   Chronic hypertension 04/30/2016   Smoker 03/05/2016   Migraine without aura and with status migrainosus, not intractable 04/06/2015   Anxiety 09/21/2013   PCOS (polycystic ovarian syndrome) 12/11/2012   Erosive esophagitis 11/05/2012   Morbid obesity (HCC) 09/14/2010    Past Surgical History:  Procedure Laterality Date   CHOLECYSTECTOMY  03/06/2012   Procedure: LAPAROSCOPIC CHOLECYSTECTOMY;  Surgeon: Fabio Bering, MD;  Location: [facility];  Service: General;  Laterality: N/A;   ESOPHAGOGASTRODUODENOSCOPY N/A 09/09/2012   Procedure: ESOPHAGOGASTRODUODENOSCOPY (EGD);  Surgeon: Malissa Hippo, MD;  Location: [facility];  Service: Endoscopy;  Laterality: N/A;  340   MASS EXCISION Right 03/23/2021   Procedure: EXCISION SOFT TISSUE MASS ON RIGHT SHOULDER;  Surgeon: Darnell Level, MD;  Location: [facility];  Service: General;  Laterality: Right;   MOUTH SURGERY     ROBOTIC ASSISTED LAPAROSCOPIC OVARIAN CYSTECTOMY Left 01/02/2021   Procedure: XI ROBOTIC ASSISTED LEFT LAPAROSCOPIC OVARIAN CYSTECTOMY;  Surgeon: Adolphus Birchwood, MD;  Location: [facility];  Service: Gynecology;  Laterality: Left;   ROBOTIC ASSISTED LAPAROSCOPIC OVARIAN CYSTECTOMY Left 11/05/2022   Procedure: XI ROBOTIC ASSISTED LAPAROSCOPIC LEFT  OVARIAN CYSTECTOMY;  Surgeon: Clide Cliff, MD;  Location: [facility];  Service: Gynecology;  Laterality: Left;   ROBOTIC ASSISTED SALPINGO OOPHERECTOMY Right 01/02/2021   Procedure: XI ROBOTIC ASSISTED RIGHT SALPINGO OOPHORECTOMY;  Surgeon: Adolphus Birchwood, MD;  Location: [facility];  Service: Gynecology;  Laterality: Right;   SKIN GRAFT  2009   gum graft   THYROIDECTOMY N/A 03/23/2021   Procedure: TOTAL THYROIDECTOMY;  Surgeon: Darnell Level, MD;  Location: [facility];  Service: General;  Laterality: N/A;    TONSILLECTOMY      OB History     Gravida  3   Para  3   Term  3   Preterm      AB      Living  3      SAB      IAB      Ectopic      Multiple  0   Live Births  3            Home Medications    Prior to Admission medications   Medication Sig Start Date End Date Taking? Authorizing Provider  fluticasone (FLONASE) 50 MCG/ACT nasal spray Place 2 sprays into both nostrils daily. 09/30/23  Yes Leath-Warren, Sadie Haber, NP  lidocaine (XYLOCAINE) 2 % solution Gargle and spit 5mL every 6 hours as needed for throat pain or discomfort. 09/30/23  Yes Leath-Warren, Sadie Haber, NP  acetaminophen (TYLENOL) 500 MG tablet Take 500 mg by mouth every 6 (six) hours as needed for moderate pain.    [provider]  ALPRAZolam Prudy Feeler) 0.5 MG tablet 1/2 to 1 bid prn panic attack caution drowsiness 09/17/23   Babs Sciara, MD  amLODipine (NORVASC) 2.5 MG tablet Take 1 tablet by mouth once daily 09/22/23   Babs Sciara, MD  famotidine-calcium carbonate-magnesium hydroxide (PEPCID COMPLETE) [phone] MG chewable tablet Chew 1 tablet by mouth daily as needed (acid reflux).    [provider]  FLUoxetine (PROZAC) 10 MG capsule Take 1 capsule (10 mg total) by mouth daily. Patient not taking: Reported on 09/29/2023 09/17/23   Babs Sciara, MD  ibuprofen (ADVIL) 200 MG tablet Take 200-400 mg by mouth every 6 (six) hours as needed for moderate pain.    [provider]  levonorgestrel (MIRENA) 20 MCG/24HR IUD 1 each by Intrauterine route once.    [provider]  levothyroxine (SYNTHROID) 150 MCG tablet Take 150 mcg by mouth every morning. 06/24/23   [provider]  Semaglutide-Weight Management (WEGOVY) 0.25 MG/0.5ML SOAJ 0.25 weekly Patient not taking: Reported on 09/29/2023 06/17/23   Babs Sciara, MD  tretinoin (RETIN-A) 0.025 % cream Apply topically at bedtime.    [provider]    Family History Family History  Adopted: Yes   Problem Relation Age of Onset   Congestive Heart Failure Mother    Irritable bowel syndrome Mother    Ulcers Mother    Anemia Mother    Drug abuse Mother    Hypertension Brother    Colon cancer Maternal Grandmother 72   Anxiety disorder Maternal Grandmother    Heart disease Maternal Grandfather 60       MI    Social History Social History   Tobacco Use   Smoking status: Every Day    Current packs/day: 1.00    Average packs/day: 1 pack/day for 10.0 years (10.0 ttl pk-yrs)    Types: Cigarettes   Smokeless tobacco: Never  Vaping Use   Vaping status: Former  Substance Use  Topics   Alcohol use: No   Drug use: No     Allergies   Other, Bupropion, Cymbalta [duloxetine hcl], Oxycodone, Silicone, Vicodin [hydrocodone-acetaminophen], and Zofran [ondansetron hcl]   Review of Systems Review of Systems Per HPI  Physical Exam Triage Vital Signs ED Triage Vitals  Encounter Vitals Group     BP 09/30/23 1242 109/76     Systolic BP Percentile --      Diastolic BP Percentile --      Pulse Rate 09/30/23 1242 98     Resp 09/30/23 1242 20     Temp 09/30/23 1242 98.7 F (37.1 C)     Temp Source 09/30/23 1242 Oral     SpO2 09/30/23 1242 96 %     Weight --      Height --      Head Circumference --      Peak Flow --      Pain Score 09/30/23 1241 5     Pain Loc --      Pain Education --      Exclude from Growth Chart --    No data found.  Updated Vital Signs BP 109/76 (BP Location: Right Arm)   Pulse 98   Temp 98.7 F (37.1 C) (Oral)   Resp 20   LMP 09/13/2023   SpO2 96%   Visual Acuity Right Eye Distance:   Left Eye Distance:   Bilateral Distance:    Right Eye Near:   Left Eye Near:    Bilateral Near:     Physical Exam Vitals and nursing note reviewed.  Constitutional:      General: She is not in acute distress.    Appearance: She is well-developed.  HENT:     Head: Normocephalic.     Right Ear: Tympanic membrane, ear canal and external ear normal.      Left Ear: Tympanic membrane, ear canal and external ear normal.     Nose: Congestion present.     Right Turbinates: Enlarged and swollen.     Left Turbinates: Enlarged and swollen.     Mouth/Throat:     Lips: Pink.     Mouth: Mucous membranes are moist.     Pharynx: Uvula midline. Pharyngeal swelling, posterior oropharyngeal erythema and postnasal drip present. No oropharyngeal exudate or uvula swelling.     Tonsils: 1+ on the right. 1+ on the left.  Eyes:     Extraocular Movements: Extraocular movements intact.     Conjunctiva/sclera: Conjunctivae normal.     Pupils: Pupils are equal, round, and reactive to light.  Cardiovascular:     Rate and Rhythm: Normal rate and regular rhythm.     Pulses: Normal pulses.     Heart sounds: Normal heart sounds.  Pulmonary:     Effort: Pulmonary effort is normal. No respiratory distress.     Breath sounds: Normal breath sounds. No stridor. No wheezing, rhonchi or rales.  Abdominal:     General: Bowel sounds are normal.     Palpations: Abdomen is soft.     Tenderness: There is no abdominal tenderness.  Musculoskeletal:     Cervical back: Normal range of motion.  Lymphadenopathy:     Cervical: No cervical adenopathy.  Skin:    General: Skin is warm and dry.  Neurological:     General: No focal deficit present.     Mental Status: She is alert and oriented to person, place, and time.  Psychiatric:  Mood and Affect: Mood normal.        Behavior: Behavior normal.      UC Treatments / Results  Labs (all labs ordered are listed, but only abnormal results are displayed) Labs Reviewed  CULTURE, GROUP A STREP ([facility])  POC COVID19/FLU A&B COMBO  POCT RAPID STREP A (OFFICE)    EKG   Radiology No results found.  Procedures Procedures (including critical care time)  Medications Ordered in UC Medications - No data to display  Initial Impression / Assessment and Plan / UC Course  I have reviewed the triage vital signs and the  nursing notes.  Pertinent labs & imaging results that were available during my care of the patient were reviewed by me and considered in my medical decision making (see chart for details).  COVID/flu test and rapid strep test were negative.  Throat culture is pending.  Symptoms consistent with influenza-like illness.  Will provide symptomatic treatment to include fluticasone 50 mcg nasal spray, and viscous lidocaine 2% for throat pain or discomfort.  Supportive care recommendations were provided and discussed with the patient to include fluids, rest, over-the-counter analgesics, and a soft diet.  Discussed indications regarding follow-up.  Patient was in agreement with this plan of care and verbalized understanding.  All questions were answered.  Patient stable for discharge.  Work note was provided.  Final Clinical Impressions(s) / UC Diagnoses   Final diagnoses:  Influenza-like illness  Sore throat     Discharge Instructions      The COVID/flu test and rapid strep test were negative.  A throat culture has been ordered.  You will be contacted if the pending test result is abnormal. Take medication as prescribed. Increase fluids and allow for plenty of rest. May continue over-the-counter Tylenol or ibuprofen as needed for pain, fever, or general discomfort. Warm salt water gargles 3-4 times daily as needed for throat pain or discomfort.  Also recommend using over-the-counter Chloraseptic throat spray or throat lozenges while symptoms persist. Normal saline nasal spray throughout the day for nasal congestion and runny nose. If you develop a cough, recommend using a humidifier in your bedroom at nighttime during sleep and sleeping elevated on pillows while symptoms persist. As discussed, you should remain home until you have been fever free for 24 hours with no medication. Symptoms should improve over the next 5 to 7 days.  If symptoms fail to improve, or appear to be worsening, you may  follow-up in this clinic or with your primary care physician for further evaluation. Follow-up as needed.     ED Prescriptions     Medication Sig Dispense Auth. Provider   fluticasone (FLONASE) 50 MCG/ACT nasal spray Place 2 sprays into both nostrils daily. 16 g Leath-Warren, Sadie Haber, NP   lidocaine (XYLOCAINE) 2 % solution Gargle and spit 5mL every 6 hours as needed for throat pain or discomfort. 100 mL Leath-Warren, Sadie Haber, NP      PDMP not reviewed this encounter.   Abran Cantor, NP 09/30/23 1346

## 2023-09-30 NOTE — Discharge Instructions (Signed)
 The COVID/flu test and rapid strep test were negative.  A throat culture has been ordered.  You will be contacted if the pending test result is abnormal. Take medication as prescribed. Increase fluids and allow for plenty of rest. May continue over-the-counter Tylenol or ibuprofen as needed for pain, fever, or general discomfort. Warm salt water gargles 3-4 times daily as needed for throat pain or discomfort.  Also recommend using over-the-counter Chloraseptic throat spray or throat lozenges while symptoms persist. Normal saline nasal spray throughout the day for nasal congestion and runny nose. If you develop a cough, recommend using a humidifier in your bedroom at nighttime during sleep and sleeping elevated on pillows while symptoms persist. As discussed, you should remain home until you have been fever free for 24 hours with no medication. Symptoms should improve over the next 5 to 7 days.  If symptoms fail to improve, or appear to be worsening, you may follow-up in this clinic or with your primary care physician for further evaluation. Follow-up as needed.

## 2023-09-30 NOTE — Telephone Encounter (Signed)
 Rescheduling appointment due to patient said sick congested,sore throat, ear pain, fever

## 2023-10-01 ENCOUNTER — Telehealth: Payer: Self-pay

## 2023-10-01 ENCOUNTER — Encounter: Payer: Self-pay | Admitting: Family Medicine

## 2023-10-01 ENCOUNTER — Ambulatory Visit: Payer: BC Managed Care – PPO | Admitting: Diagnostic Neuroimaging

## 2023-10-01 LAB — CULTURE, GROUP A STREP (THRC)

## 2023-10-01 MED ORDER — AMOXICILLIN 500 MG PO CAPS
500.0000 mg | ORAL_CAPSULE | Freq: Two times a day (BID) | ORAL | 0 refills | Status: AC
Start: 2023-10-01 — End: 2023-10-11

## 2023-10-01 NOTE — Telephone Encounter (Signed)
 Per protocol, pt requires treatment with Amoxicillin.  Attempted to reach patient x1. Call could not be completed. Number checked twice. Rx sent to pharmacy on file.

## 2023-10-06 ENCOUNTER — Encounter: Payer: BC Managed Care – PPO | Attending: Family Medicine | Admitting: Nutrition

## 2023-10-06 DIAGNOSIS — R7303 Prediabetes: Secondary | ICD-10-CM | POA: Diagnosis present

## 2023-10-06 NOTE — Patient Instructions (Signed)
 Goals  Keep working on meal planning and meal prepping  80% of the time Drink 6 bottles of water per day. Cut out SF beverages. Try to eat some real protein from eggs, whole grains, nuts, seeds or leftover instead of protein powder in coffee Increase mindful eating habits. Start walking 3 times per week at 30 minutes each. Lose 1/2 lb per week.

## 2023-10-06 NOTE — Progress Notes (Unsigned)
 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 Has been trying to make better choices when cravaing sweets. Has been working on eating on more vegetables. Has gone to family trips to healthier food grocery. Working on lower fat version      33 yr old wfemale referred for obesity and desired weight loss. BMI 37 Currently on Wegovy. Has Hyperlipidemia. High risk for CVD and complications. 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 PCP Dr. Gerda Diss No weight loss since being on Wegovy. Admits to eating 1-2 times per day. Not eating full balanced meals. Snacking and eating late at night at times.   She is willing to work with Lifestyle Medicine in a more whole plant based predominant food intake and lifestyle.   Clinical Wt Readings from Last 3 Encounters:  09/29/23 214 lb (97.1 kg)  09/17/23 215 lb (97.5 kg)  09/13/23 218 lb 0.6 oz (98.9 kg)   Ht Readings from Last 3 Encounters:  09/29/23 5\' 4"  (1.626 m)  09/17/23 5\' 4"  (1.626 m)  09/13/23 5\' 4"  (1.626 m)   There is no height or weight on file to calculate BMI. @BMIFA @ Facility age limit for growth %iles is 20 years. Facility age limit for growth %iles is 20 years.  Medical Hx:  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 36   GERD (gastroesophageal reflux disease)    Gestational diabetes    H/O urinary frequency 06/25/2013   Hypothyroidism    Irregular intermenstrual bleeding 10/12/2013   Lipoma    right scapula   MRSA infection    Obesity    Ovarian cancer (HCC)    Right   Palpitations    PCOS (polycystic ovarian syndrome)     Postpartum depression    Tobacco abuse     Medications:  Current Outpatient Medications on File Prior to Visit  Medication Sig Dispense Refill   amoxicillin (AMOXIL) 500 MG capsule Take 1 capsule (500 mg total) by mouth 2 (two) times daily for 10 days. 20 capsule 0   acetaminophen (TYLENOL) 500 MG tablet Take 500 mg by mouth every 6 (six) hours as needed for moderate pain.     ALPRAZolam (XANAX) 0.5 MG tablet 1/2 to 1 bid prn panic attack caution drowsiness 6 tablet 0   amLODipine (NORVASC) 2.5 MG tablet Take 1 tablet by mouth once daily 30 tablet 2   famotidine-calcium carbonate-magnesium hydroxide (PEPCID COMPLETE) 10-800-165 MG chewable tablet Chew 1 tablet by mouth daily as needed (acid reflux).     FLUoxetine (PROZAC) 10 MG capsule Take 1 capsule (10 mg total) by mouth daily. (Patient not taking: Reported on 09/29/2023) 30 capsule 3   fluticasone (FLONASE) 50 MCG/ACT nasal spray Place 2 sprays into both nostrils daily. 16 g 0   ibuprofen (ADVIL) 200 MG tablet Take 200-400 mg by mouth every 6 (six) hours as needed for moderate pain.     levonorgestrel (MIRENA) 20 MCG/24HR IUD 1 each by Intrauterine route once.     levothyroxine (SYNTHROID) 150 MCG tablet Take 150 mcg by mouth  every morning.     lidocaine (XYLOCAINE) 2 % solution Gargle and spit 5mL every 6 hours as needed for throat pain or discomfort. 100 mL 0   Semaglutide-Weight Management (WEGOVY) 0.25 MG/0.5ML SOAJ 0.25 weekly (Patient not taking: Reported on 09/29/2023) 2 mL 2   tretinoin (RETIN-A) 0.025 % cream Apply topically at bedtime.     No current facility-administered medications on file prior to visit.    Labs:  Lab Results  Component Value Date   HGBA1C 5.7 (H) 05/21/2023      Latest Ref Rng & Units 09/13/2023    4:29 PM 05/21/2023    4:34 PM 12/31/2022    3:14 PM  CMP  Glucose 70 - 99 mg/dL 86  92  027   BUN 6 - 20 mg/dL 11  11  10    Creatinine 0.44 - 1.00 mg/dL 2.53  6.64  4.03   Sodium 135 - 145 mmol/L 139   139  139   Potassium 3.5 - 5.1 mmol/L 3.8  4.1  4.4   Chloride 98 - 111 mmol/L 107  103  104   CO2 22 - 32 mmol/L 23  22  19    Calcium 8.9 - 10.3 mg/dL 8.9  9.2    Total Protein 6.5 - 8.1 g/dL 7.2     Total Bilirubin 0.0 - 1.2 mg/dL 0.5     Alkaline Phos 38 - 126 U/L 57     AST 15 - 41 U/L 24     ALT 0 - 44 U/L 31      Lipid Panel     Component Value Date/Time   CHOL 241 (H) 05/21/2023 1634   TRIG 188 (H) 05/21/2023 1634   HDL 41 05/21/2023 1634   CHOLHDL 5.9 (H) 05/21/2023 1634   CHOLHDL 4.8 05/10/2014 0919   VLDL 41 (H) 05/10/2014 0919   LDLCALC 165 (H) 05/21/2023 1634   LABVLDL 35 05/21/2023 1634    Notable Signs/Symptoms: craves sweets  Smoke- less a pack a day.  Lifestyle & Dietary Hx [place] with husband LPN works FT  Estimated daily fluid intake: 80 oz Supplements: none Sleep: not good Stress / self-care: some Current average weekly physical activity: ADL  24-Hr Dietary Recall B) Protein coffee or oatmeal L) Leftovers; Has been doing some salad kits and chicken D) [place] burgers,  sf sweet tea,   Estimated Energy Needs Calories: 1200 Carbohydrate: 135g Protein: 90g Fat: 33g   NUTRITION DIAGNOSIS  NI-1.7 Predicted excessive energy intake As related to Excessive calorie intake and inconsistent food intake .  As evidenced by BMI.   NUTRITION INTERVENTION  Nutrition education (E-1) on the following topics:  Nutrition and Pre Diabetes education provided on My Plate, CHO counting, meal planning, portion sizes, timing of meals, avoiding snacks between meals unless having a low blood sugar, target ranges for A1C and blood sugars, signs/symptoms and treatment of hyper/hypoglycemia, monitoring blood sugars, taking medications as prescribed, benefits of exercising 30 minutes per day and prevention of complications of DM.  Lifestyle Medicine  - Whole Food, Plant Predominant Nutrition is highly recommended: Eat Plenty of vegetables, Mushrooms, fruits, Legumes,  Whole Grains, Nuts, seeds in lieu of processed meats, processed snacks/pastries red meat, poultry, eggs.    -It is better to avoid simple carbohydrates including: Cakes, Sweet Desserts, Ice Cream, Soda (diet and regular), Sweet Tea, Candies, Chips, Cookies, Store Bought Juices, Alcohol in Excess of  1-2 drinks a day, Lemonade,  Artificial Sweeteners, Doughnuts, Coffee Creamers, "Sugar-free" Products, etc, etc.  This is not a complete list.....  Exercise: If you are able: 30 -60 minutes a day ,4 days a week, or 150 minutes a week.  The longer the better.  Combine stretch, strength, and aerobic activities.  If you were told in the past that you have high risk for cardiovascular diseases, you may seek evaluation by your heart doctor prior to initiating moderate to intense exercise programs.   Handouts Provided Include  Lifestyle Medicine Know your numbers   Learning Style & Readiness for Change Teaching method utilized: Visual & Auditory  Demonstrated degree of understanding via: Teach Back  Barriers to learning/adherence to lifestyle change: none  Goals Established by Pt Goals Keep working meal prepping and meal planning Use UTUBE for meal     MONITORING & EVALUATION Dietary intake, weekly physical activity, and weight in 1 month.  Recommend to increase dose of Wegovy.  Next Steps  Patient is to work on meal planning and exercise.Marland Kitchen

## 2023-10-07 ENCOUNTER — Encounter: Payer: Self-pay | Admitting: Nutrition

## 2023-10-13 ENCOUNTER — Ambulatory Visit: Payer: BC Managed Care – PPO | Admitting: Family Medicine

## 2023-10-16 ENCOUNTER — Ambulatory Visit

## 2023-10-16 DIAGNOSIS — R1032 Left lower quadrant pain: Secondary | ICD-10-CM | POA: Diagnosis not present

## 2023-10-16 DIAGNOSIS — T8332XA Displacement of intrauterine contraceptive device, initial encounter: Secondary | ICD-10-CM | POA: Diagnosis not present

## 2023-10-16 NOTE — Progress Notes (Signed)
 PELVIC US TA/TV: homogeneous anteverted uterus,WNL,IUD is centrally located within the endometrium,normal left ovary,right oophorectomy,no free fluid,no pain during ultrasound  Chaperone Tish

## 2023-10-20 ENCOUNTER — Encounter: Payer: Self-pay | Admitting: Family Medicine

## 2023-10-21 NOTE — Progress Notes (Unsigned)
 [facility]  PATIENT: Marissa Lane DOB: Apr 23, 1991  REFERRING DOCTOR OR PCP: [name] MD SOURCE: Patient, notes from primary care, imaging and lab reports, MRI images personally reviewed.  _________________________________   HISTORICAL  CHIEF COMPLAINT:  Chief Complaint  Patient presents with   New Patient (Initial Visit)    Pt in 11 with dad Pt here for syncope and lightheadedness,brain fog . Pt states pressure around  head feels like crown on head . Leg weakness     HISTORY OF PRESENT ILLNESS:  I had the pleasure of seeing your patient, Marissa Lane, at [facility] for neurologic consultation regarding her recent episodes of visual changes and history of presyncope/dizziness.  She is a 33 year old woman who presented to the emergency room on 09/13/2023 with 2 weeks of flickering  right lower quadrant visual changes in the right eye and a 1 day history of smudging of the central vision on the right.  This is better now but she has still had rare intermittent symptoms.  Symptoms gradually improved but she feels vision is not quite baseline.  She noted some eye pain with movements but no actual headaches.  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     She has episodes of near syncope since April 2024.  These usually last a few hours.   She had had several other visits to the emergency room in 2024 for dizziness and/or near syncope.   She is doing better and still has spells of lightheadedness but fewer pre-syncopal events.   Spells can occur while standing or sitting upright but not when laying down.    Often spells happen with head turned to the right.   She is having fewer the past month but had an episode yesterday lasting 2-3 hours.     During a spell she feels a little dissociated.   After a spell starts, she sometimes has anxiety but the  anxiety follows, not proceeds, the spells.    She has had panic attackes in the past and these feel different.   Most spells are not associated with any stress.    She was on fluoxetine in the past and tolerated it but reently started and noted muscle spasms.  She stopped a few days ago.      She has seen cardiology and had Echo and 2 week monitor.   BP and blood sugars have bene fine.       Imaging personally reviewed: MRI of the head and orbits 09/13/2023 were personally reviewed.  They appear normal.    REVIEW OF SYSTEMS: Constitutional: No fevers, chills, sweats, or change in appetite Eyes: Visual changes and eye pain as above Ear, nose and throat: No hearing loss, ear pain, nasal congestion, sore throat Cardiovascular: No chest pain, palpitations Respiratory:  No shortness of breath at rest or with exertion.   No wheezes GastrointestinaI: No nausea, vomiting, diarrhea, abdominal pain, fecal incontinence Genitourinary:  No dysuria, urinary retention or frequency.  No nocturia. Musculoskeletal:  No neck pain, back pain Integumentary: No rash, pruritus, skin lesions Neurological: as above Psychiatric: No depression at this time.  No anxiety Endocrine: No palpitations, diaphoresis, change in appetite, change in weigh or increased thirst Hematologic/Lymphatic:  No anemia, purpura, petechiae. Allergic/Immunologic: No itchy/runny eyes, nasal congestion, recent allergic reactions, rashes  ALLERGIES: Allergies  Allergen Reactions   Other Itching and Rash  DERMABOND-RASH WITH ITCHING AT INCISION WITH THYROID SURGERY   Bupropion Other (See Comments)    WORSENING DEPRESSION   Cymbalta [Duloxetine Hcl] Other (See Comments)    Worsening depression   Oxycodone Itching    Redness   Silicone Itching    Contacts-eyes itching   Vicodin [Hydrocodone-Acetaminophen] Other (See Comments)    Bad migraine   Zofran [Ondansetron Hcl] Itching    HOME MEDICATIONS:  Current Outpatient  Medications:    acetaminophen (TYLENOL) 500 MG tablet, Take 500 mg by mouth every 6 (six) hours as needed for moderate pain., Disp: , Rfl:    ALPRAZolam (XANAX) 0.5 MG tablet, 1/2 to 1 bid prn panic attack caution drowsiness (Patient taking differently: as needed. 1/2 to 1 bid prn panic attack caution drowsiness), Disp: 6 tablet, Rfl: 0   amLODipine (NORVASC) 2.5 MG tablet, Take 1 tablet by mouth once daily, Disp: 30 tablet, Rfl: 2   famotidine-calcium carbonate-magnesium hydroxide (PEPCID COMPLETE) 10-800-165 MG chewable tablet, Chew 1 tablet by mouth daily as needed (acid reflux)., Disp: , Rfl:    fluticasone (FLONASE) 50 MCG/ACT nasal spray, Place 2 sprays into both nostrils daily., Disp: 16 g, Rfl: 0   ibuprofen (ADVIL) 200 MG tablet, Take 200-400 mg by mouth every 6 (six) hours as needed for moderate pain., Disp: , Rfl:    levonorgestrel (MIRENA) 20 MCG/24HR IUD, 1 each by Intrauterine route once., Disp: , Rfl:    levothyroxine (SYNTHROID) 150 MCG tablet, Take 150 mcg by mouth every morning., Disp: , Rfl:    tretinoin (RETIN-A) 0.025 % cream, Apply topically at bedtime., Disp: , Rfl:    FLUoxetine (PROZAC) 10 MG capsule, Take 1 capsule (10 mg total) by mouth daily. (Patient not taking: Reported on 10/22/2023), Disp: 30 capsule, Rfl: 3   Semaglutide-Weight Management (WEGOVY) 0.25 MG/0.5ML SOAJ, 0.25 weekly (Patient not taking: Reported on 09/17/2023), Disp: 2 mL, Rfl: 2  PAST MEDICAL HISTORY: 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 38   GERD (gastroesophageal reflux disease)    Gestational diabetes    H/O urinary frequency 06/25/2013   Hypothyroidism    Irregular intermenstrual bleeding 10/12/2013   Lipoma    right scapula   MRSA infection    Obesity    Ovarian cancer (HCC)    Right   Palpitations    PCOS (polycystic ovarian  syndrome)    Postpartum depression    Tobacco abuse     PAST SURGICAL HISTORY: Past Surgical History:  Procedure Laterality Date   CHOLECYSTECTOMY  03/06/2012   Procedure: LAPAROSCOPIC CHOLECYSTECTOMY;  Surgeon: Fabio Bering, MD;  Location: [facility];  Service: General;  Laterality: N/A;   ESOPHAGOGASTRODUODENOSCOPY N/A 09/09/2012   Procedure: ESOPHAGOGASTRODUODENOSCOPY (EGD);  Surgeon: Malissa Hippo, MD;  Location: [facility];  Service: Endoscopy;  Laterality: N/A;  340   MASS EXCISION Right 03/23/2021   Procedure: EXCISION SOFT TISSUE MASS ON RIGHT SHOULDER;  Surgeon: Darnell Level, MD;  Location: [facility];  Service: General;  Laterality: Right;   MOUTH SURGERY     ROBOTIC ASSISTED LAPAROSCOPIC OVARIAN CYSTECTOMY Left 01/02/2021   Procedure: XI ROBOTIC ASSISTED LEFT LAPAROSCOPIC OVARIAN CYSTECTOMY;  Surgeon: Adolphus Birchwood, MD;  Location: [facility];  Service: Gynecology;  Laterality: Left;   ROBOTIC  ASSISTED LAPAROSCOPIC OVARIAN CYSTECTOMY Left 11/05/2022   Procedure: XI ROBOTIC ASSISTED LAPAROSCOPIC LEFT OVARIAN CYSTECTOMY;  Surgeon: Clide Cliff, MD;  Location: [facility];  Service: Gynecology;  Laterality: Left;   ROBOTIC ASSISTED SALPINGO OOPHERECTOMY Right 01/02/2021   Procedure: XI ROBOTIC ASSISTED RIGHT SALPINGO OOPHORECTOMY;  Surgeon: Adolphus Birchwood, MD;  Location: [facility];  Service: Gynecology;  Laterality: Right;   SKIN GRAFT  2009   gum graft   THYROIDECTOMY N/A 03/23/2021   Procedure: TOTAL THYROIDECTOMY;  Surgeon: Darnell Level, MD;  Location: [facility];  Service: General;  Laterality: N/A;   TONSILLECTOMY      FAMILY HISTORY: Family History  Adopted: Yes  Problem Relation Age of Onset   Congestive Heart Failure Mother    Irritable bowel syndrome Mother    Ulcers Mother    Anemia Mother    Drug abuse Mother    Hypertension Brother    Colon cancer Maternal Grandmother 53   Anxiety disorder Maternal Grandmother    Heart disease Maternal Grandfather 40       MI   Multiple sclerosis  Neg Hx     SOCIAL HISTORY: Social History   Socioeconomic History   Marital status: Married    Spouse name: Sundi Slevin   Number of children: 3   Years of education: Not on file   Highest education level: Associate degree: occupational, Scientist, product/process development, or vocational program  Occupational History   Occupation: LPN  Tobacco Use   Smoking status: Every Day    Current packs/day: 1.00    Average packs/day: 1 pack/day for 10.0 years (10.0 ttl pk-yrs)    Types: Cigarettes   Smokeless tobacco: Never   Tobacco comments:    Pt vapes daily   Vaping Use   Vaping status: Every Day  Substance and Sexual Activity   Alcohol use: No   Drug use: No   Sexual activity: Yes    Birth control/protection: I.U.D.  Other Topics Concern   Not on file  Social History Narrative   Pt works    Pt lives with family    Social Drivers of Health   Financial Resource Strain: Low Risk  (06/30/2023)   Overall Financial Resource Strain (CARDIA)    Difficulty of Paying Living Expenses: Not very hard  Food Insecurity: No Food Insecurity (06/30/2023)   Hunger Vital Sign    Worried About Running Out of Food in the Last Year: Never true    Ran Out of Food in the Last Year: Never true  Transportation Needs: No Transportation Needs (06/30/2023)   PRAPARE - Administrator, Civil Service (Medical): No    Lack of Transportation (Non-Medical): No  Physical Activity: Inactive (06/30/2023)   Exercise Vital Sign    Days of Exercise per Week: 0 days    Minutes of Exercise per Session: 0 min  Stress: Stress Concern Present (06/30/2023)   [employer] of Occupational Health - Occupational Stress Questionnaire    Feeling of Stress : To some extent  Social Connections: Moderately Integrated (06/30/2023)   Social Connection and Isolation Panel [NHANES]    Frequency of Communication with Friends and Family: More than three times a week    Frequency of Social Gatherings with Friends and Family: Three times a  week    Attends Religious Services: More than 4 times per year    Active Member of Clubs or Organizations: No    Attends Banker Meetings: Never    Marital Status: Married  Catering manager Violence:  Not At Risk (06/30/2023)   Humiliation, Afraid, Rape, and Kick questionnaire    Fear of Current or Ex-Partner: No    Emotionally Abused: No    Physically Abused: No    Sexually Abused: No       PHYSICAL EXAM  Vitals:   10/22/23 1429  Weight: 213 lb (96.6 kg)  Height: 5\' 4"  (1.626 m)   Orthostatic Vitals for the past 48 hrs (Last 6 readings):  Patient Position (if appropriate) BP- Standing at 0 minutes Pulse- Standing at 0 minutes BP- Lying Pulse- Lying  10/22/23 1430 Orthostatic Vitals 121/84 83 125/82 78     Body mass index is 36.56 kg/m.    General: The patient is well-developed and well-nourished and in no acute distress  HEENT:  Head is Harvey/AT.  Sclera are anicteric.  Funduscopic exam shows normal optic discs and retinal vessels.  Neck: No carotid bruits are noted.  The neck is nontender.  Cardiovascular: The heart has a regular rate and rhythm with a normal S1 and S2. There were no murmurs, gallops or rubs.    Skin: Extremities are without rash or  edema.  Musculoskeletal:  Back is nontender  Neurologic Exam  Mental status: The patient is alert and oriented x 3 at the time of the examination. The patient has apparent normal recent and remote memory, with an apparently normal attention span and concentration ability.   Speech is normal.  Cranial nerves: Extraocular movements are full. Pupils are equal, round, and reactive to light and accomodation.   Facial symmetry is present. There is good facial sensation to soft touch bilaterally.Facial strength is normal.  Trapezius and sternocleidomastoid strength is normal. No dysarthria is noted.  The tongue is midline, and the patient has symmetric elevation of the soft palate. No obvious hearing deficits are  noted.  Motor:  Muscle bulk is normal.   Tone is normal. Strength is  5 / 5 in all 4 extremities.   Sensory: Sensory testing is intact to pinprick, soft touch and vibration sensation in all 4 extremities.  Coordination: Cerebellar testing reveals good finger-nose-finger and heel-to-shin bilaterally.  Gait and station: Station is normal.   Gait is normal. Tandem gait is minimally wide. Romberg is negative.   Reflexes: Deep tendon reflexes are symmetric and normal bilaterally.   Plantar responses are flexor.    DIAGNOSTIC DATA (LABS, IMAGING, TESTING) - I reviewed patient records, labs, notes, testing and imaging myself where available.  Lab Results  Component Value Date   WBC 7.5 09/13/2023   HGB 14.5 09/13/2023   HCT 42.2 09/13/2023   MCV 90.0 09/13/2023   PLT 327 09/13/2023      Component Value Date/Time   NA 139 09/13/2023 1629   NA 139 05/21/2023 1634   K 3.8 09/13/2023 1629   CL 107 09/13/2023 1629   CO2 23 09/13/2023 1629   GLUCOSE 86 09/13/2023 1629   BUN 11 09/13/2023 1629   BUN 11 05/21/2023 1634   CREATININE 0.64 09/13/2023 1629   CALCIUM 8.9 09/13/2023 1629   PROT 7.2 09/13/2023 1629   PROT 7.2 09/08/2020 0811   ALBUMIN 3.7 09/13/2023 1629   ALBUMIN 4.4 09/08/2020 0811   AST 24 09/13/2023 1629   ALT 31 09/13/2023 1629   ALKPHOS 57 09/13/2023 1629   BILITOT 0.5 09/13/2023 1629   BILITOT <0.2 09/08/2020 0811   GFRNONAA >60 09/13/2023 1629   GFRAA 136 09/08/2020 0811   Lab Results  Component Value Date   CHOL 241 (H)  05/21/2023   HDL 41 05/21/2023   LDLCALC 165 (H) 05/21/2023   TRIG 188 (H) 05/21/2023   CHOLHDL 5.9 (H) 05/21/2023   Lab Results  Component Value Date   HGBA1C 5.7 (H) 05/21/2023   Lab Results  Component Value Date   VITAMINB12 474 09/13/2023   Lab Results  Component Value Date   TSH 0.007 (L) 12/31/2022       ASSESSMENT AND PLAN  Pre-syncope  Vision changes  Altered awareness, transient - Plan: EEG adult   In  summary, Ms. Goguen is a 33 year old woman who has had multiple episodes of presyncope over the last year and visual changes over the last 2 weeks.  MRI of the brain was normal.  She has had a normal cardiac evaluation she did not have any orthostatic changes on examination.  Neurologic examination was normal.  The etiology of the spells is uncertain.  Because she sometimes has a dissociative sensation when one of the spells occur, we will check an EEG to determine if there is any epileptiform activity.  The MRI of the brain being normal makes diagnoses such as MS or stroke unlikely.  It is possible that the episodes could represent variance of panic attacks.  She does note some anxiety but this does do not appear to be triggered by anxiety.  If they persist, consider a trial of buspirone.  If the spells of presyncope worsen could also consider evaluating MR or CT angiogram to assess vertebrobasilar flow.  Thank you for asking me to see Ms. Irving.  I did not schedule a follow-up at this time but she is advised to give [place] a call if she has new or worsening neurologic symptoms.  Thank you for asking [place] to see this patient.  Please let me know if I can be of further assistance with her or other patients in the future.     Loukas Antonson A. Epimenio Foot, MD, [facility] 10/22/2023, 3:03 PM Certified in Neurology, Clinical Neurophysiology, Sleep Medicine and Neuroimaging  [facility] [address], [address] [place], [place] [address] [phone]

## 2023-10-22 ENCOUNTER — Ambulatory Visit: Admitting: Neurology

## 2023-10-22 ENCOUNTER — Encounter: Payer: Self-pay | Admitting: Neurology

## 2023-10-22 VITALS — Ht 64.0 in | Wt 213.0 lb

## 2023-10-22 DIAGNOSIS — R55 Syncope and collapse: Secondary | ICD-10-CM

## 2023-10-22 DIAGNOSIS — R404 Transient alteration of awareness: Secondary | ICD-10-CM

## 2023-10-22 DIAGNOSIS — H539 Unspecified visual disturbance: Secondary | ICD-10-CM

## 2023-10-31 ENCOUNTER — Inpatient Hospital Stay: Payer: BC Managed Care – PPO | Admitting: Genetic Counselor

## 2023-10-31 ENCOUNTER — Inpatient Hospital Stay: Payer: BC Managed Care – PPO | Attending: Genetic Counselor

## 2023-10-31 ENCOUNTER — Telehealth: Payer: Self-pay | Admitting: Internal Medicine

## 2023-10-31 NOTE — Telephone Encounter (Signed)
 Afternoon, Courteous notification of patient being no show for genetic referral today. We are reaching out to patient Thank you

## 2023-11-03 ENCOUNTER — Encounter

## 2023-11-03 DIAGNOSIS — R0681 Apnea, not elsewhere classified: Secondary | ICD-10-CM

## 2023-11-21 DIAGNOSIS — G4733 Obstructive sleep apnea (adult) (pediatric): Secondary | ICD-10-CM | POA: Diagnosis not present

## 2023-12-03 ENCOUNTER — Ambulatory Visit
Admission: EM | Admit: 2023-12-03 | Discharge: 2023-12-03 | Disposition: A | Discharge status: Home / Self Care | Attending: Nurse Practitioner | Admitting: Nurse Practitioner

## 2023-12-03 DIAGNOSIS — Z20818 Contact with and (suspected) exposure to other bacterial communicable diseases: Secondary | ICD-10-CM | POA: Insufficient documentation

## 2023-12-03 DIAGNOSIS — J029 Acute pharyngitis, unspecified: Secondary | ICD-10-CM | POA: Diagnosis present

## 2023-12-03 LAB — POCT RAPID STREP A (OFFICE): Rapid Strep A Screen: NEGATIVE

## 2023-12-03 MED ORDER — AMOXICILLIN 500 MG PO CAPS
500.0000 mg | ORAL_CAPSULE | Freq: Two times a day (BID) | ORAL | 0 refills | Status: AC
Start: 2023-12-03 — End: 2023-12-13

## 2023-12-03 NOTE — ED Triage Notes (Signed)
 Pt reports sore throat, nausea, redness at the back of throat, sx's started yesterday, states two of her children recently have gotten over strep.

## 2023-12-03 NOTE — ED Provider Notes (Signed)
 [facility]    CSN: [id number] Arrival date & time: 12/03/23  0800      History   Chief Complaint No chief complaint on file.   HPI Marissa Lane is a 33 y.o. female.   The history is provided by the patient.   Patient presents for complaints of sore throat, nausea, and pain with swallowing that started over the past 24 hours.  Patient denies fever, chills, headache, ear pain, nasal congestion, runny nose, cough, abdominal pain, vomiting, diarrhea, or rash.  Patient states that her children have been treated for strep throat over the past week.  States she has been taking Tylenol  for symptoms with some relief.  Past Medical History:  Diagnosis Date   Acne    Anxiety    Cancer (HCC)    thyroid    Chest pain    Chronic hypertension    Dyspnea    No longer and issue   Dysthymic disorder    over dose age 55   GERD (gastroesophageal reflux disease)    Gestational diabetes    H/O urinary frequency 06/25/2013   Hypothyroidism    Irregular intermenstrual bleeding 10/12/2013   Lipoma    right scapula   MRSA infection    Obesity    Ovarian cancer (HCC)    Right   Palpitations    PCOS (polycystic ovarian syndrome)    Postpartum depression    Tobacco abuse     Patient Active Problem List   Diagnosis Date Noted   IUD threads lost 09/29/2023   LLQ pain 09/29/2023   Claustrophobia 09/17/2023   Postural dizziness with presyncope 01/06/2023   Palpitations 01/06/2023   Prediabetes 11/15/2022   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   Mass of soft tissue of shoulder 03/20/2021   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   Lipoma of back 01/18/2021   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   Cyst of right ovary 01/02/2021   Cyst of ovary, left 11/02/2020   Encounter for IUD insertion 11/02/2020   Pregnancy examination or test, negative result 11/02/2020   GAD (generalized anxiety disorder) 08/24/2019   Attention deficit hyperactivity  disorder (ADHD), 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   Cervical high risk HPV (human papillomavirus) test positive 01/09/2017   History of gestational diabetes 07/19/2016   Chronic hypertension 04/30/2016   Smoker 03/05/2016   Migraine without aura and with status migrainosus, not intractable 04/06/2015   Anxiety 09/21/2013   PCOS (polycystic ovarian syndrome) 12/11/2012   Erosive esophagitis 11/05/2012   Morbid obesity (HCC) 09/14/2010    Past Surgical History:  Procedure Laterality Date   CHOLECYSTECTOMY  03/06/2012   Procedure: LAPAROSCOPIC CHOLECYSTECTOMY;  Surgeon: Lovena Rubinstein, MD;  Location: [facility];  Service: General;  Laterality: N/A;   ESOPHAGOGASTRODUODENOSCOPY N/A 09/09/2012   Procedure: ESOPHAGOGASTRODUODENOSCOPY (EGD);  Surgeon: Ruby Corporal, MD;  Location: [facility];  Service: Endoscopy;  Laterality: N/A;  340   MASS EXCISION Right 03/23/2021   Procedure: EXCISION SOFT TISSUE MASS ON RIGHT SHOULDER;  Surgeon: Oralee Billow, MD;  Location: [facility];  Service: General;  Laterality: Right;   MOUTH SURGERY     ROBOTIC ASSISTED LAPAROSCOPIC OVARIAN CYSTECTOMY Left 01/02/2021   Procedure: XI ROBOTIC ASSISTED LEFT LAPAROSCOPIC OVARIAN CYSTECTOMY;  Surgeon: Alphonso Aschoff, MD;  Location: [facility];  Service: Gynecology;  Laterality: Left;   ROBOTIC ASSISTED LAPAROSCOPIC OVARIAN CYSTECTOMY Left 11/05/2022   Procedure: XI ROBOTIC ASSISTED LAPAROSCOPIC LEFT OVARIAN CYSTECTOMY;  Surgeon: Derrel Flies, MD;  Location: [facility];  Service: Gynecology;  Laterality: Left;   ROBOTIC ASSISTED SALPINGO OOPHERECTOMY Right 01/02/2021   Procedure: XI ROBOTIC ASSISTED RIGHT SALPINGO OOPHORECTOMY;  Surgeon: Alphonso Aschoff, MD;  Location: [facility];  Service: Gynecology;  Laterality: Right;   SKIN GRAFT  2009   gum graft   THYROIDECTOMY N/A 03/23/2021   Procedure: TOTAL THYROIDECTOMY;  Surgeon: Oralee Billow, MD;  Location: [facility];  Service: General;  Laterality: N/A;   TONSILLECTOMY      OB History     Gravida  3    Para  3   Term  3   Preterm      AB      Living  3      SAB      IAB      Ectopic      Multiple  0   Live Births  3            Home Medications    Prior to Admission medications   Medication Sig Start Date End Date Taking? Authorizing Provider  amoxicillin  (AMOXIL ) 500 MG capsule Take 1 capsule (500 mg total) by mouth 2 (two) times daily for 10 days. 12/03/23 12/13/23 Yes Leath-Warren, Belen Bowers, NP  acetaminophen  (TYLENOL ) 500 MG tablet Take 500 mg by mouth every 6 (six) hours as needed for moderate pain.    [provider]  ALPRAZolam  (XANAX ) 0.5 MG tablet 1/2 to 1 bid prn panic attack caution drowsiness Patient taking differently: as needed. 1/2 to 1 bid prn panic attack caution drowsiness 09/17/23   Bennet Brasil, MD  amLODipine  (NORVASC ) 2.5 MG tablet Take 1 tablet by mouth once daily 09/22/23   Bennet Brasil, MD  famotidine -calcium  carbonate-magnesium  hydroxide (PEPCID  COMPLETE) [phone] MG chewable tablet Chew 1 tablet by mouth daily as needed (acid reflux).    [provider]  FLUoxetine  (PROZAC ) 10 MG capsule Take 1 capsule (10 mg total) by mouth daily. Patient not taking: Reported on 10/22/2023 09/17/23   Bennet Brasil, MD  fluticasone  (FLONASE ) 50 MCG/ACT nasal spray Place 2 sprays into both nostrils daily. 09/30/23   Leath-Warren, Belen Bowers, NP  ibuprofen  (ADVIL ) 200 MG tablet Take 200-400 mg by mouth every 6 (six) hours as needed for moderate pain.    [provider]  levonorgestrel  (MIRENA ) 20 MCG/24HR IUD 1 each by Intrauterine route once.    [provider]  levothyroxine (SYNTHROID) 150 MCG tablet Take 150 mcg by mouth every morning. 06/24/23   [provider]  Semaglutide -Weight Management (WEGOVY ) 0.25 MG/0.5ML SOAJ 0.25 weekly Patient not taking: Reported on 09/17/2023 06/17/23   Bennet Brasil, MD  tretinoin (RETIN-A) 0.025 % cream Apply topically at bedtime.    [provider]    Family  History Family History  Adopted: Yes  Problem Relation Age of Onset   Congestive Heart Failure Mother    Irritable bowel syndrome Mother    Ulcers Mother    Anemia Mother    Drug abuse Mother    Hypertension Brother    Colon cancer Maternal Grandmother 9   Anxiety disorder Maternal Grandmother    Heart disease Maternal Grandfather 9       MI   Multiple sclerosis Neg Hx     Social History Social History   Tobacco Use   Smoking status: Every Day    Current packs/day: 1.00    Average packs/day: 1 pack/day for 10.0 years (10.0 ttl pk-yrs)    Types: Cigarettes   Smokeless tobacco: Never  Tobacco comments:    Pt vapes daily   Vaping Use   Vaping status: Every Day  Substance Use Topics   Alcohol use: No   Drug use: No     Allergies   Other, Bupropion, Cymbalta [duloxetine hcl], Oxycodone , Silicone, Vicodin [hydrocodone-acetaminophen ], and Zofran  [ondansetron  hcl]   Review of Systems Review of Systems Per HPI  Physical Exam Triage Vital Signs ED Triage Vitals  Encounter Vitals Group     BP 12/03/23 0826 114/80     Systolic BP Percentile --      Diastolic BP Percentile --      Pulse Rate 12/03/23 0826 87     Resp 12/03/23 0826 18     Temp 12/03/23 0826 97.7 F (36.5 C)     Temp Source 12/03/23 0826 Oral     SpO2 12/03/23 0826 98 %     Weight --      Height --      Head Circumference --      Peak Flow --      Pain Score 12/03/23 0830 4     Pain Loc --      Pain Education --      Exclude from Growth Chart --    No data found.  Updated Vital Signs BP 114/80 (BP Location: Right Arm)   Pulse 87   Temp 97.7 F (36.5 C) (Oral)   Resp 18   LMP 11/19/2023 (Within Days)   SpO2 98%   Visual Acuity Right Eye Distance:   Left Eye Distance:   Bilateral Distance:    Right Eye Near:   Left Eye Near:    Bilateral Near:     Physical Exam Vitals and nursing note reviewed.  Constitutional:      General: She is not in acute distress.    Appearance:  Normal appearance.  HENT:     Head: Normocephalic.     Right Ear: Tympanic membrane, ear canal and external ear normal.     Left Ear: Tympanic membrane, ear canal and external ear normal.     Mouth/Throat:     Lips: Pink.     Mouth: Mucous membranes are moist.     Pharynx: Uvula midline. Pharyngeal swelling and posterior oropharyngeal erythema present. No oropharyngeal exudate or uvula swelling.     Tonsils: No tonsillar exudate. 1+ on the right. 1+ on the left.  Eyes:     Extraocular Movements: Extraocular movements intact.     Conjunctiva/sclera: Conjunctivae normal.     Pupils: Pupils are equal, round, and reactive to light.  Cardiovascular:     Rate and Rhythm: Normal rate and regular rhythm.     Pulses: Normal pulses.     Heart sounds: Normal heart sounds.  Pulmonary:     Effort: Pulmonary effort is normal. No respiratory distress.     Breath sounds: Normal breath sounds. No stridor. No wheezing, rhonchi or rales.  Abdominal:     General: Bowel sounds are normal.     Palpations: Abdomen is soft.     Tenderness: There is no abdominal tenderness.  Musculoskeletal:     Cervical back: Normal range of motion.  Lymphadenopathy:     Cervical: No cervical adenopathy.  Skin:    General: Skin is warm and dry.  Neurological:     General: No focal deficit present.     Mental Status: She is alert and oriented to person, place, and time.  Psychiatric:        Mood and  Affect: Mood normal.        Behavior: Behavior normal.      UC Treatments / Results  Labs (all labs ordered are listed, but only abnormal results are displayed) Labs Reviewed  CULTURE, GROUP A STREP [facility])  POCT RAPID STREP A (OFFICE)    EKG   Radiology No results found.  Procedures Procedures (including critical care time)  Medications Ordered in UC Medications - No data to display  Initial Impression / Assessment and Plan / UC Course  I have reviewed the triage vital signs and the nursing  notes.  Pertinent labs & imaging results that were available during my care of the patient were reviewed by me and considered in my medical decision making (see chart for details).  Rapid strep test was negative.  Given the patient's recent close exposure, will treat empirically with amoxicillin  500 mg while throat culture is pending.  Supportive care recommendations were provided and discussed with the patient to include fluids, rest, over-the-counter analgesics, warm salt water  gargles, and use of Chloraseptic throat lozenges.  Patient was advised if the culture result is negative, she will need to stop the antibiotic.  Patient was in agreement with this plan of care and verbalizes understanding.  All questions were answered.  Patient stable for discharge.  Final Clinical Impressions(s) / UC Diagnoses   Final diagnoses:  Acute pharyngitis, unspecified etiology  Strep throat exposure     Discharge Instructions      The rapid strep test was negative.  A throat culture is pending.  You will be contacted if the pending test result is positive.  You will also be contacted if the result is negative and advised to stop the antibiotic. Take medication as prescribed. Increase fluids and allow for plenty of rest. Continue over-the-counter Tylenol  or ibuprofen  as needed for pain, fever, or general discomfort. Warm salt water  gargles 3-4 times daily while throat pain persist.  Also recommend over-the-counter Chloraseptic throat spray or throat lozenges while symptoms are improved. Recommend a soft diet to include soup, broth, yogurt, pudding, or Jell-O. Follow-up as needed.   ED Prescriptions     Medication Sig Dispense Auth. Provider   amoxicillin  (AMOXIL ) 500 MG capsule Take 1 capsule (500 mg total) by mouth 2 (two) times daily for 10 days. 20 capsule Leath-Warren, Belen Bowers, NP      PDMP not reviewed this encounter.   Hardy Lia, NP 12/03/23 [phone]

## 2023-12-03 NOTE — Discharge Instructions (Signed)
 The rapid strep test was negative.  A throat culture is pending.  You will be contacted if the pending test result is positive.  You will also be contacted if the result is negative and advised to stop the antibiotic. Take medication as prescribed. Increase fluids and allow for plenty of rest. Continue over-the-counter Tylenol  or ibuprofen  as needed for pain, fever, or general discomfort. Warm salt water  gargles 3-4 times daily while throat pain persist.  Also recommend over-the-counter Chloraseptic throat spray or throat lozenges while symptoms are improved. Recommend a soft diet to include soup, broth, yogurt, pudding, or Jell-O. Follow-up as needed.

## 2023-12-06 LAB — CULTURE, GROUP A STREP (THRC)

## 2023-12-08 ENCOUNTER — Ambulatory Visit (HOSPITAL_COMMUNITY): Payer: Self-pay

## 2023-12-08 ENCOUNTER — Telehealth: Payer: Self-pay

## 2023-12-08 ENCOUNTER — Ambulatory Visit: Admitting: Nutrition

## 2023-12-08 NOTE — Telephone Encounter (Signed)
 Copied from CRM [id number]. Topic: Clinical - Lab/Test Results >> Dec 08, 2023  3:30 PM Marissa Lane wrote: Reason for CRM: PT CALLED TO CHECK STATUS OF HOME SLEEP TEST ADVISED RECEIVED BUT PROVIDER HAS NOT MADE NOTES ON WHAT THE NEXT STEP IS. ADVISED SOMEONE WILL REACH OUT TO HER ONCE THAT'S DONE.  Sending Mychart message

## 2023-12-09 ENCOUNTER — Encounter: Attending: Family Medicine | Admitting: Nutrition

## 2023-12-09 DIAGNOSIS — R7303 Prediabetes: Secondary | ICD-10-CM | POA: Diagnosis present

## 2023-12-09 NOTE — Progress Notes (Unsigned)
 Medical Nutrition Therapy  Appointment Start time:  73  Appointment End time:  1206 Primary concerns today: Obesity Referral diagnosis: E66.01 Preferred learning style: No preference  Learning readiness: Ready   NUTRITION ASSESSMENT  32 yr old wfemale referred by Dr.Luking for obesity. Is on standby with Wegovy . So hasn't started it yet. Hasn't smoked in 2 months. Still takes a hit of vape occasionally. Working on quitting that too. Had a URI and went to urgent care last week. Plans on scheduling follow up visit with Dr. Fairy Homer in a few months after lifestyle changes and stopping smoking to re evaluate A1C and other labs. Working on choosing healthier foods choices. Family has been better about eating healthier foods. Working on trying not to gain weight since she is quitting smoking. Is aware of potential weight gain and need for increased and consistent exercise. No recent A1C  Last A1C 5.7%.  She is willing to work with Lifestyle Medicine in a more whole plant based predominant food intake and lifestyle.   Clinical Wt Readings from Last 3 Encounters:  12/09/23 215 lb (97.5 kg)  10/22/23 213 lb (96.6 kg)  10/06/23 212 lb (96.2 kg)   Ht Readings from Last 3 Encounters:  12/09/23 5\' 4"  (1.626 m)  10/22/23 5\' 4"  (1.626 m)  10/06/23 5\' 4"  (1.626 m)   Body mass index is 36.9 kg/m. @BMIFA @ Facility age limit for growth %iles is 20 years. Facility age limit for growth %iles is 20 years. Medical Hx:  Past Medical History:  Diagnosis Date   Acne    Anxiety    Cancer (HCC)    thyroid    Chest pain    Chronic hypertension    Dyspnea    No longer and issue   Dysthymic disorder    over dose age 33   GERD (gastroesophageal reflux disease)    Gestational diabetes    H/O urinary frequency 06/25/2013   Hypothyroidism    Irregular intermenstrual bleeding 10/12/2013   Lipoma    right scapula   MRSA infection    Obesity    Ovarian cancer (HCC)    Right   Palpitations    PCOS  (polycystic ovarian syndrome)    Postpartum depression    Tobacco abuse     Medications:  Current Outpatient Medications on File Prior to Visit  Medication Sig Dispense Refill   acetaminophen  (TYLENOL ) 500 MG tablet Take 500 mg by mouth every 6 (six) hours as needed for moderate pain.     ALPRAZolam  (XANAX ) 0.5 MG tablet 1/2 to 1 bid prn panic attack caution drowsiness (Patient taking differently: as needed. 1/2 to 1 bid prn panic attack caution drowsiness) 6 tablet 0   amLODipine  (NORVASC ) 2.5 MG tablet Take 1 tablet by mouth once daily 30 tablet 2   amoxicillin  (AMOXIL ) 500 MG capsule Take 1 capsule (500 mg total) by mouth 2 (two) times daily for 10 days. 20 capsule 0   famotidine -calcium  carbonate-magnesium  hydroxide (PEPCID  COMPLETE) 10-800-165 MG chewable tablet Chew 1 tablet by mouth daily as needed (acid reflux).     FLUoxetine  (PROZAC ) 10 MG capsule Take 1 capsule (10 mg total) by mouth daily. (Patient not taking: Reported on 10/22/2023) 30 capsule 3   fluticasone  (FLONASE ) 50 MCG/ACT nasal spray Place 2 sprays into both nostrils daily. 16 g 0   ibuprofen  (ADVIL ) 200 MG tablet Take 200-400 mg by mouth every 6 (six) hours as needed for moderate pain.     levonorgestrel  (MIRENA ) 20 MCG/24HR IUD 1 each  by Intrauterine route once.     levothyroxine (SYNTHROID) 150 MCG tablet Take 150 mcg by mouth every morning.     Semaglutide -Weight Management (WEGOVY ) 0.25 MG/0.5ML SOAJ 0.25 weekly (Patient not taking: Reported on 09/17/2023) 2 mL 2   tretinoin (RETIN-A) 0.025 % cream Apply topically at bedtime.     No current facility-administered medications on file prior to visit.    Labs:  Lab Results  Component Value Date   HGBA1C 5.7 (H) 05/21/2023      Latest Ref Rng & Units 09/13/2023    4:29 PM 05/21/2023    4:34 PM 12/31/2022    3:14 PM  CMP  Glucose 70 - 99 mg/dL 86  92  725   BUN 6 - 20 mg/dL 11  11  10    Creatinine 0.44 - 1.00 mg/dL 3.66  4.40  3.47   Sodium 135 - 145 mmol/L 139   139  139   Potassium 3.5 - 5.1 mmol/L 3.8  4.1  4.4   Chloride 98 - 111 mmol/L 107  103  104   CO2 22 - 32 mmol/L 23  22  19    Calcium  8.9 - 10.3 mg/dL 8.9  9.2    Total Protein 6.5 - 8.1 g/dL 7.2     Total Bilirubin 0.0 - 1.2 mg/dL 0.5     Alkaline Phos 38 - 126 U/L 57     AST 15 - 41 U/L 24     ALT 0 - 44 U/L 31      Lipid Panel     Component Value Date/Time   CHOL 241 (H) 05/21/2023 1634   TRIG 188 (H) 05/21/2023 1634   HDL 41 05/21/2023 1634   CHOLHDL 5.9 (H) 05/21/2023 1634   CHOLHDL 4.8 05/10/2014 0919   VLDL 41 (H) 05/10/2014 0919   LDLCALC 165 (H) 05/21/2023 1634   LABVLDL 35 05/21/2023 1634    Notable Signs/Symptoms: craves sweets  Smoke- less a pack a day.  Lifestyle & Dietary Hx [place] with husband LPN works FT  Estimated daily fluid intake: 80 oz Supplements: none Sleep: not good Stress / self-care: some Current average weekly physical activity: ADL  24-Hr Dietary Recall B) Protein coffee or oatmeal L) Leftovers; Has been doing some salad kits and chicken D) [place] burgers,  sf sweet tea,   Estimated Energy Needs Calories: 1200 Carbohydrate: 135g Protein: 90g Fat: 33g   NUTRITION DIAGNOSIS  NI-1.7 Predicted excessive energy intake As related to Excessive calorie intake and inconsistent food intake .  As evidenced by BMI.   NUTRITION INTERVENTION  Nutrition education (E-1) on the following topics:  Nutrition and Pre Diabetes education provided on My Plate, CHO counting, meal planning, portion sizes, timing of meals, avoiding snacks between meals unless having a low blood sugar, target ranges for A1C and blood sugars, signs/symptoms and treatment of hyper/hypoglycemia, monitoring blood sugars, taking medications as prescribed, benefits of exercising 30 minutes per day and prevention of complications of DM.  Lifestyle Medicine  - Whole Food, Plant Predominant Nutrition is highly recommended: Eat Plenty of vegetables, Mushrooms, fruits, Legumes,  Whole Grains, Nuts, seeds in lieu of processed meats, processed snacks/pastries red meat, poultry, eggs.    -It is better to avoid simple carbohydrates including: Cakes, Sweet Desserts, Ice Cream, Soda (diet and regular), Sweet Tea, Candies, Chips, Cookies, Store Bought Juices, Alcohol in Excess of  1-2 drinks a day, Lemonade,  Artificial Sweeteners, Doughnuts, Coffee Creamers, "Sugar-free" Products, etc, etc.  This is not a complete  list.....  Exercise: If you are able: 30 -60 minutes a day ,4 days a week, or 150 minutes a week.  The longer the better.  Combine stretch, strength, and aerobic activities.  If you were told in the past that you have high risk for cardiovascular diseases, you may seek evaluation by your heart doctor prior to initiating moderate to intense exercise programs.   Handouts Provided Include  Lifestyle Medicine Know your numbers   Learning Style & Readiness for Change Teaching method utilized: Visual & Auditory  Demonstrated degree of understanding via: Teach Back  Barriers to learning/adherence to lifestyle change: none  Goals Established by Pt  GoalGoals  Continue to increase water  intake to 80 oz. Work on meal planning and meal prepping. Aim to get in 25 grams of fiber per day Try baking soda with beans to help reduce gass    MONITORING & EVALUATION Dietary intake, weekly physical activity, and weight in 3 months.  Next Steps  Patient is to work on meal planning and exercise.Aaron Aas

## 2023-12-09 NOTE — Patient Instructions (Signed)
 Goals  Continue to increase water  intake to 80 oz. Work on meal planning and meal prepping. Aim to get in 25 grams of fiber per day Try baking soda with beans to help reduce gas

## 2023-12-16 ENCOUNTER — Other Ambulatory Visit: Payer: Self-pay | Admitting: Family Medicine

## 2023-12-17 ENCOUNTER — Encounter: Payer: Self-pay | Admitting: Nutrition

## 2024-01-13 ENCOUNTER — Telehealth: Payer: Self-pay | Admitting: Primary Care

## 2024-01-13 ENCOUNTER — Telehealth: Payer: Self-pay

## 2024-01-13 NOTE — Telephone Encounter (Signed)
 Marissa Lane is due the equivalent of metabolic 7, lipid, A1c, TSH, free T4, T3 Diagnosis hypothyroidism, high risk medication, hyperlipidemia, prediabetes  Patient has other specialist-please talk with the patient to find out are there particular labs that she is hoping for us  to do?  Also review over the labs that we are recommending but if her other specialist has checked these recently we do not necessarily have to do all of these tests We are trying to order appropriate testing without over testing Thanks-Dr. Glendia

## 2024-01-13 NOTE — Telephone Encounter (Signed)
 Home sleep study showed mild obstructive sleep apnea, average 6.5 apneic events per hour. Normal oxygen levels. If symptomatic can refer to orthodontics for oral appliance or start on CPAP. If patient would like to discuss in more detail schedule virtual visit

## 2024-01-13 NOTE — Telephone Encounter (Signed)
 Please advise of results of sleep study. This is in media tab.

## 2024-01-13 NOTE — Telephone Encounter (Signed)
 Sent results to my nurse

## 2024-01-13 NOTE — Telephone Encounter (Signed)
 Communication  Reason for CRM: Patient needs Lab orders before July 8th.

## 2024-01-14 ENCOUNTER — Telehealth: Payer: Self-pay | Admitting: *Deleted

## 2024-01-14 ENCOUNTER — Other Ambulatory Visit: Payer: Self-pay

## 2024-01-14 DIAGNOSIS — Z79899 Other long term (current) drug therapy: Secondary | ICD-10-CM

## 2024-01-14 DIAGNOSIS — E785 Hyperlipidemia, unspecified: Secondary | ICD-10-CM

## 2024-01-14 DIAGNOSIS — E0590 Thyrotoxicosis, unspecified without thyrotoxic crisis or storm: Secondary | ICD-10-CM

## 2024-01-14 DIAGNOSIS — R7303 Prediabetes: Secondary | ICD-10-CM

## 2024-01-14 NOTE — Telephone Encounter (Signed)
 Copied from CRM [id number]. Topic: Clinical - Lab/Test Results >> Dec 08, 2023  3:30 PM Rozanna G wrote: Reason for CRM: PT CALLED TO CHECK STATUS OF HOME SLEEP TEST ADVISED RECEIVED BUT PROVIDER HAS NOT MADE NOTES ON WHAT THE NEXT STEP IS. ADVISED SOMEONE WILL REACH OUT TO HER ONCE THAT'S DONE. >> Jan 13, 2024  3:57 PM Marissa Lane wrote: Patient [phone] wants at home sleep study results, mailed back in April 2025, reached out to the office 12/08/23 and was advised NP, Marissa has not reviewed results yet and will contact patient. Patient has not heard from anyone, and would like a call back with results and treatment plan possible cpap machine.  Marissa Lane ORN, NP  Nurse Practitioner Specialty: Pulmonary Disease   Telephone Encounter Signed   Encounter Date: 01/13/2024   Signed     Home sleep study showed mild obstructive sleep apnea, average 6.5 apneic events per hour. Normal oxygen levels. If symptomatic can refer to orthodontics for oral appliance or start on CPAP. If patient would like to discuss in more detail schedule virtual visit         Electronically signed by Marissa Lane ORN, NP at 01/13/2024  5:28 PM  Consuelo Blakes CMA ATC x1 (11:17 am).  LVM to return call.  ATC x2.  LVM to return call.  Sent mychart message as well.

## 2024-01-14 NOTE — Telephone Encounter (Signed)
 Copied from CRM [id number]. Topic: Clinical - Lab/Test Results >> Dec 08, 2023  3:30 PM Rozanna G wrote: Reason for CRM: PT CALLED TO CHECK STATUS OF HOME SLEEP TEST ADVISED RECEIVED BUT PROVIDER HAS NOT MADE NOTES ON WHAT THE NEXT STEP IS. ADVISED SOMEONE WILL REACH OUT TO HER ONCE THAT'S DONE. >> Jan 13, 2024  3:57 PM Leila BROCKS wrote: Patient [id number] wants at home sleep study results, mailed back in April 2025, reached out to the office 12/08/23 and was advised NP, Hope has not reviewed results yet and will contact patient. Patient has not heard from anyone, and would like a call back with results and treatment plan possible cpap machine.  ATC x1 LVM for patient to call our office back regarding sleep study result's.

## 2024-01-16 NOTE — Telephone Encounter (Signed)
 Copied from CRM [id number]. Topic: Clinical - Lab/Test Results >> Jan 14, 2024  3:44 PM Rilla B wrote: Reason for CRM: Returning call to [facility], please call patient first thing in the morning, or after 2pm. Spoke with patient regarding sleep study result's   ome sleep study showed mild obstructive sleep apnea, average 6.5 apneic events per hour. Normal oxygen levels. If symptomatic can refer to orthodontics for oral appliance or start on CPAP. If patient would like to discuss in more detail schedule virtual visit   Patient would like to start CPAP but I dont see the pressure setting   Beth can you please advise

## 2024-01-19 ENCOUNTER — Other Ambulatory Visit: Payer: Self-pay

## 2024-01-19 DIAGNOSIS — G4733 Obstructive sleep apnea (adult) (pediatric): Secondary | ICD-10-CM

## 2024-01-19 NOTE — Telephone Encounter (Signed)
 Please place an order for CPAP 5-15cm h20 with mask of choice and heated humidity FU in 31-90 days for compliance check

## 2024-01-19 NOTE — Telephone Encounter (Signed)
 Ordered has been placed and sent patient a Mychart message .Nothing else further needed.

## 2024-01-21 ENCOUNTER — Telehealth: Payer: Self-pay

## 2024-01-21 NOTE — Telephone Encounter (Signed)
 Patient is coming in Tuesday to see Marissa Lane and needs lab work ordered. Patient went to Dentist and thinks she thinks she has a autoimmune that may be tested we advised she may want to wait to see what Marissa Lane says on that part before ordered but regular lab work to be ordered.

## 2024-01-21 NOTE — Telephone Encounter (Signed)
 I called and spoke to pt. Pt states she missed a phone call from [place] with [facility] and tried calling back but could not reach anyone. I informed pt that it would be best to give this more time to see if they call her back. If not, she should try calling them but if she has any issues then to let us  know and we can help reach out to the DME. CPAP was already ordered. Pt verbalized understanding. NFN

## 2024-01-23 ENCOUNTER — Ambulatory Visit: Payer: Self-pay | Admitting: Family Medicine

## 2024-01-23 LAB — BASIC METABOLIC PANEL WITH GFR
BUN/Creatinine Ratio: 19 (ref 9–23)
BUN: 12 mg/dL (ref 6–20)
CO2: 20 mmol/L (ref 20–29)
Calcium: 9.3 mg/dL (ref 8.7–10.2)
Chloride: 104 mmol/L (ref 96–106)
Creatinine, Ser: 0.64 mg/dL (ref 0.57–1.00)
Glucose: 101 mg/dL — ABNORMAL HIGH (ref 70–99)
Potassium: 4.2 mmol/L (ref 3.5–5.2)
Sodium: 140 mmol/L (ref 134–144)
eGFR: 119 mL/min/1.73 (ref >59)

## 2024-01-23 LAB — T3: T3, Total: 103 ng/dL (ref 71–180)

## 2024-01-23 LAB — LIPID PANEL
Chol/HDL Ratio: 7.1 ratio — ABNORMAL HIGH (ref 0.0–4.4)
Cholesterol, Total: 228 mg/dL — ABNORMAL HIGH (ref 100–199)
HDL: 32 mg/dL — ABNORMAL LOW (ref >39)
LDL Chol Calc (NIH): 149 mg/dL — ABNORMAL HIGH (ref 0–99)
Triglycerides: 253 mg/dL — ABNORMAL HIGH (ref 0–149)
VLDL Cholesterol Cal: 47 mg/dL — ABNORMAL HIGH (ref 5–40)

## 2024-01-23 LAB — HEMOGLOBIN A1C
Est. average glucose Bld gHb Est-mCnc: 108 mg/dL
Hgb A1c MFr Bld: 5.4 % (ref 4.8–5.6)

## 2024-01-23 LAB — TSH+FREE T4
Free T4: 1.9 ng/dL — ABNORMAL HIGH (ref 0.82–1.77)
TSH: 0.308 u[IU]/mL — ABNORMAL LOW (ref 0.450–4.500)

## 2024-01-27 ENCOUNTER — Encounter: Payer: Self-pay | Admitting: Nurse Practitioner

## 2024-01-27 ENCOUNTER — Ambulatory Visit: Admitting: Nurse Practitioner

## 2024-01-27 VITALS — BP 116/83 | HR 101 | Temp 98.1°F | Ht 64.0 in | Wt 209.4 lb

## 2024-01-27 DIAGNOSIS — Z9889 Other specified postprocedural states: Secondary | ICD-10-CM | POA: Diagnosis not present

## 2024-01-27 DIAGNOSIS — R7989 Other specified abnormal findings of blood chemistry: Secondary | ICD-10-CM

## 2024-01-27 DIAGNOSIS — F411 Generalized anxiety disorder: Secondary | ICD-10-CM

## 2024-01-27 DIAGNOSIS — I10 Essential (primary) hypertension: Secondary | ICD-10-CM | POA: Diagnosis not present

## 2024-01-27 DIAGNOSIS — E782 Mixed hyperlipidemia: Secondary | ICD-10-CM

## 2024-01-27 DIAGNOSIS — R7303 Prediabetes: Secondary | ICD-10-CM

## 2024-01-27 DIAGNOSIS — Z9089 Acquired absence of other organs: Secondary | ICD-10-CM

## 2024-01-27 MED ORDER — HYDROXYZINE HCL 25 MG PO TABS: 25.0000 mg | ORAL_TABLET | Freq: Three times a day (TID) | ORAL | 0 refills | Status: DC | PRN

## 2024-01-27 MED ORDER — ESCITALOPRAM OXALATE 10 MG PO TABS: 10.0000 mg | ORAL_TABLET | Freq: Every day | ORAL | 0 refills | Status: DC

## 2024-01-27 NOTE — Patient Instructions (Addendum)
 Marissa Lane [facility]

## 2024-01-27 NOTE — Progress Notes (Signed)
 Subjective:    Patient ID: Marissa Lane, female    DOB: Sep 03, 1990, 33 y.o.   MRN: [id number]  HPI Presents for recheck on her anxiety and to discuss her most recent labs.  Patient feels she needs daily medication for anxiety.  Has had reactions to several SSRIs, most recently Prozac  which caused severe muscle spasms.  Patient took it for several weeks but had to discontinue.  The chart notes she has taken Lexapro  for months at a time in the past without difficulty.  Patient is working with the local dietitian trying to eat healthier.  No red meat.  Reduced sugar and sweets in her diet.  Patient has quit smoking, does have light vaping of nicotine  daily.  BP at home running 110-120 over 70s.  Has not taken any of her alprazolam  but has not on hand in case it is needed which is reassuring.  Would like to get a refill on her hydroxyzine  which has helped with her anxiety in the past.  Denies suicidal or homicidal thoughts or ideation.  Denies any self-harm behaviors.  SH: total thyroidectomy; followed by endocrinology  Review of Systems  Constitutional:  Positive for fatigue.  Respiratory:  Negative for cough, chest tightness and shortness of breath.   Cardiovascular:  Negative for chest pain.  Psychiatric/Behavioral:  Positive for sleep disturbance. Negative for self-injury and suicidal ideas. The patient is nervous/anxious.        01/27/2024    2:51 PM  Depression screen PHQ 2/9  Decreased Interest 0  Down, Depressed, Hopeless 0  PHQ - 2 Score 0  Altered sleeping 1  Tired, decreased energy 0  Change in appetite 0  Feeling bad or failure about yourself  2  Trouble concentrating 2  Moving slowly or fidgety/restless 0  Suicidal thoughts 0  PHQ-9 Score 5  Difficult doing work/chores Not difficult at all      01/27/2024    2:51 PM 09/17/2023    3:33 PM 09/08/2023    9:58 AM 06/30/2023   11:47 AM  GAD 7 : Generalized Anxiety Score  Nervous, Anxious, on Edge 3 2 2 2   Control/stop  worrying 3 2 2 2   Worry too much - different things 3 2 2 2   Trouble relaxing 2 2 2 1   Restless 2 2 2  0  Easily annoyed or irritable 1 2 2 1   Afraid - awful might happen 1 0 1 1  Total GAD 7 Score 15 12 13 9   Anxiety Difficulty Very difficult Somewhat difficult Somewhat difficult    Social History   Tobacco Use   Smoking status: Former    Current packs/day: 1.00    Average packs/day: 1 pack/day for 10.0 years (10.0 ttl pk-yrs)    Types: Cigarettes   Smokeless tobacco: Never   Tobacco comments:    Pt vapes daily   Vaping Use   Vaping status: Every Day  Substance Use Topics   Alcohol use: No   Drug use: No        Objective:   Physical Exam Vitals and nursing note reviewed.  Constitutional:      General: She is not in acute distress. Cardiovascular:     Rate and Rhythm: Normal rate and regular rhythm.  Pulmonary:     Effort: Pulmonary effort is normal.     Breath sounds: Normal breath sounds.  Neurological:     Mental Status: She is alert.  Psychiatric:        Mood and Affect: Mood  normal.        Behavior: Behavior normal.        Thought Content: Thought content normal.        Judgment: Judgment normal.    Today's Vitals   01/27/24 1439  BP: 116/83  Pulse: (!) 101  Temp: 98.1 F (36.7 C)  SpO2: 98%  Weight: 209 lb 6.4 oz (95 kg)  Height: 5' 4 (1.626 m)   Body mass index is 35.94 kg/m.    Results for orders placed or performed in visit on 01/14/24  Basic metabolic panel with GFR   Collection Time: 01/22/24  2:35 PM  Result Value Ref Range   Glucose 101 (H) 70 - 99 mg/dL   BUN 12 6 - 20 mg/dL   Creatinine, Ser 9.35 0.57 - 1.00 mg/dL   eGFR 880 >40 fO/fpw/8.26   BUN/Creatinine Ratio 19 9 - 23   Sodium 140 134 - 144 mmol/L   Potassium 4.2 3.5 - 5.2 mmol/L   Chloride 104 96 - 106 mmol/L   CO2 20 20 - 29 mmol/L   Calcium  9.3 8.7 - 10.2 mg/dL  Hemoglobin J8r   Collection Time: 01/22/24  2:35 PM  Result Value Ref Range   Hgb A1c MFr Bld 5.4 4.8 -  5.6 %   Est. average glucose Bld gHb Est-mCnc 108 mg/dL  Lipid panel   Collection Time: 01/22/24  2:35 PM  Result Value Ref Range   Cholesterol, Total 228 (H) 100 - 199 mg/dL   Triglycerides 746 (H) 0 - 149 mg/dL   HDL 32 (L) >60 mg/dL   VLDL Cholesterol Cal 47 (H) 5 - 40 mg/dL   LDL Chol Calc (NIH) 850 (H) 0 - 99 mg/dL   Chol/HDL Ratio 7.1 (H) 0.0 - 4.4 ratio  TSH + free T4   Collection Time: 01/22/24  2:35 PM  Result Value Ref Range   TSH 0.308 (L) 0.450 - 4.500 uIU/mL   Free T4 1.90 (H) 0.82 - 1.77 ng/dL  T3   Collection Time: 01/22/24  2:35 PM  Result Value Ref Range   T3, Total 103 71 - 180 ng/dL       Assessment & Plan:   Problem List Items Addressed This Visit       Cardiovascular and Mediastinum   Chronic hypertension (Chronic)     Other   Abnormal thyroid  blood test   GAD (generalized anxiety disorder) - Primary   Relevant Medications   escitalopram  (LEXAPRO ) 10 MG tablet   hydrOXYzine  (ATARAX ) 25 MG tablet   H/O total thyroidectomy   Mixed hyperlipidemia   Prediabetes   Meds ordered this encounter  Medications   escitalopram  (LEXAPRO ) 10 MG tablet    Sig: Take 1 tablet (10 mg total) by mouth daily.    Dispense:  30 tablet    Refill:  0    Supervising Provider:   ALPHONSA HAMILTON A [[id number]]   hydrOXYzine  (ATARAX ) 25 MG tablet    Sig: Take 1 tablet (25 mg total) by mouth 3 (three) times daily as needed.    Dispense:  30 tablet    Refill:  0    Supervising Provider:   ALPHONSA HAMILTON LABOR [phone]   Labs reviewed with patient during office visit and concerns discussed. Patient plans to share her recent thyroid  blood test with her endocrinologist for adjustment in her medication. Restart escitalopram  10 mg daily.  Reviewed potential adverse effects.  Discontinue medication and contact office if any problems. Refill on hydroxyzine  to have  on hand in case of severe anxiety or panic attacks.  Drowsiness precautions. Continue follow-up with dietitian and working on  healthy lifestyle. Start OTC omega-3 for her triglycerides.  Will hold on any medication at this point and address again at her next visit. Return in about 3 months (around 04/28/2024).

## 2024-03-03 ENCOUNTER — Encounter: Attending: Family Medicine | Admitting: Nutrition

## 2024-03-09 ENCOUNTER — Encounter: Payer: Self-pay | Admitting: Nurse Practitioner

## 2024-03-09 ENCOUNTER — Ambulatory Visit: Admitting: Nurse Practitioner

## 2024-03-09 VITALS — BP 126/89 | HR 77 | Temp 98.3°F | Ht 64.0 in | Wt 214.0 lb

## 2024-03-09 DIAGNOSIS — R42 Dizziness and giddiness: Secondary | ICD-10-CM

## 2024-03-09 DIAGNOSIS — R55 Syncope and collapse: Secondary | ICD-10-CM

## 2024-03-09 NOTE — Progress Notes (Unsigned)
   Subjective:    Patient ID: Marissa Lane, female    DOB: 1990/12/01, 33 y.o.   MRN: [id number]  HPI Discussed the use of AI scribe software for clinical note transcription with the patient, who gave verbal consent to proceed.  History of Present Illness Marissa Lane is a 33 year old female who presents with dizziness and visual disturbances.  Dizziness has been ongoing for about a year, starting in May of last year. It has improved over time, with episodes now being less significant. The most recent episode was mild, rated as a 'three' on a scale compared to her worst episodes. She experiences low blood pressure at times, with the lowest recorded at 105/74. Inadequate fluid intake might contribute to her imbalance.  Visual disturbances occur during dizzy episodes, including peripheral vision issues, which led to an ER visit. A retinal specialist found no significant issues, though she might be sensitive to changes in the eye's lining. An MRI showed no abnormalities. She was evaluated by a neurologist in April, who noted her recent start on Prozac  for anxiety, later switched to Lexapro . The neurologist suggested symptoms might be related to panic attacks.  She sometimes experiences tinnitus, though she has not had an ENT workup. Changes in lighting, such as in dimly lit environments, seem to trigger symptoms. An EEG has not been performed, though it was mentioned as a possibility.  Current medications include Norvasc , which she hopes to discontinue soon, and she uses a Mirena  for birth control. She has noticed improvements since quitting smoking.     Review of Systems     Objective:   Physical Exam        Assessment & Plan:   Problem List Items Addressed This Visit       Cardiovascular and Mediastinum   Postural dizziness with presyncope - Primary (Chronic)   Assessment and Plan Assessment & Plan Dizziness, improving Dizziness has persisted for about a year but has  improved. Previously suppressed thyroid  levels may have contributed, but TSH levels have improved, correlating with symptom improvement. Differential diagnosis includes ocular migraine and Meniere's disease. Normal MRI ruled out mass or tumor. - Consider ENT referral if dizziness worsens or increases in severity. - Educated on potential triggers such as lighting changes and low blood pressure.  Generalized anxiety disorder Anxiety is cyclic and managed with escitalopram . She switched from Prozac  to Lexapro . Anxiety may contribute to dizziness and other symptoms. Neurologist suggested symptoms may relate to panic attacks. - Continue escitalopram  (Lexapro ) 10 mg oral daily.  Hypertension Blood pressure is well-controlled on a low dose of Norvasc . Discontinuation of Norvasc  may be possible with weight loss and stress reduction. - Consider discontinuing Norvasc  if blood pressure remains stable with lifestyle changes, including weight loss and stress management.

## 2024-03-10 ENCOUNTER — Encounter: Payer: Self-pay | Admitting: Nurse Practitioner

## 2024-04-01 ENCOUNTER — Telehealth: Payer: Self-pay

## 2024-04-01 ENCOUNTER — Encounter: Payer: Self-pay | Admitting: Nurse Practitioner

## 2024-04-01 NOTE — Telephone Encounter (Signed)
 Marissa Lane called wanted update on her FMLA said the dates where marked out she needs it to say 02/2024 to 02/2025

## 2024-04-16 ENCOUNTER — Other Ambulatory Visit: Payer: Self-pay | Admitting: Family Medicine

## 2024-04-28 ENCOUNTER — Encounter: Payer: Self-pay | Admitting: Primary Care

## 2024-04-28 ENCOUNTER — Ambulatory Visit: Admitting: Primary Care

## 2024-04-28 VITALS — BP 122/72 | HR 68 | Temp 98.6°F | Ht 64.0 in | Wt 210.8 lb

## 2024-04-28 DIAGNOSIS — G4733 Obstructive sleep apnea (adult) (pediatric): Secondary | ICD-10-CM

## 2024-04-28 NOTE — Progress Notes (Signed)
 @Patient  ID: Marissa Lane, female    DOB: 1991-01-17, 33 y.o.   MRN: [id number]  Chief Complaint  Patient presents with   Sleep Apnea    Referring provider: Alphonsa Glendia LABOR, MD  HPI: 33 year old female, current everyday smoker.  Past medical history significant for hypertension, migraine headache, PCOS, anxiety, ADHD, diabetes, obesity, tobacco abuse.  06/05/2023 Discussed the use of AI scribe software for clinical note transcription with the patient, who gave verbal consent to proceed.  History of Present Illness   The patient, with a history of thyroid  cancer and thyroidectomy, presents for a sleep consult due to observed apneic episodes during sleep. The patient's spouse, who was previously diagnosed with sleep apnea, reported witnessing the patient stop breathing several times throughout the night. The patient also reports instances of waking up gasping for air and experiencing a sensation of falling. The patient admits to snoring. The patient works night shifts three days a week and maintains a daytime schedule on off days, typically falling asleep within fifteen minutes. She reports waking up once a night approximately twice a week. The patient experiences constant fatigue, which she attributes to both her disrupted sleep and ongoing thyroid  issues.  The patient's mother had a history of sleep apnea. The patient is currently on suppressive doses of levothyroxine due to a history of thyroid  cancer and thyroidectomy. She reports occasional palpitations but has had a full cardiac workup with no significant findings. The patient does not consume alcohol and typically sleeps on her side, as she has noticed increased apneic episodes when sleeping on her back. The patient does not take any sleep aids. No history of seizures or sudden sleep attacks. No previous sleep studies.    04/28/2024- interim hx  Discussed the use of AI scribe software for clinical note transcription with the patient,  who gave verbal consent to proceed.  History of Present Illness Marissa Lane is a 33 year old female who presents for CPAP management.  She has a history of thyroid  cancer and underwent a thyroidectomy. She was diagnosed with mild sleep apnea following a home sleep study, which revealed 6.5 apneic episodes per hour with a drop in oxygen saturation to 74%, although she spent less than half a minute at this low level. Her baseline oxygen saturation was 98%.  Her partner has observed apneic episodes during sleep, including waking up gasping and snoring. She has been using a CPAP machine and reports doing well with it, although she continues to feel tired, which she attributes to working night shifts and 'flip-flopping' her sleep schedule. She experiences abdominal fullness upon waking, which she suspects is due to excessive pressure from the CPAP machine. She also notes that the machine sometimes wakes her up due to noise and pressure changes.  She has been using a full-face mask after initially trying a nasal mask, which was not suitable due to claustrophobia. The CPAP has helped reduce her snoring, although her husband occasionally notices mild snoring. She is consistent with CPAP use, meeting the required usage thresholds despite her variable sleep schedule due to night shifts.  She is starting Wegovy  for weight management and has had the medication for a month but is beginning it today.   Airview download 03/29/24-04/27/24 Usage days 30/30 days (100%) ; 90% > 4 hours Average usage 6 hours 56 mins Pressure 5-15cm h20 (13.7cm h20-95%) Airleaks 14.2L/min (95%) AHI 3.2   Allergies  Allergen Reactions   Other Itching and Rash    DERMABOND-RASH WITH ITCHING  AT INCISION WITH THYROID  SURGERY   Prozac  [Fluoxetine  Hcl] Other (See Comments)    Causes severe muscle spasms   Bupropion Other (See Comments)    WORSENING DEPRESSION   Cymbalta [Duloxetine Hcl] Other (See Comments)    Worsening depression    Oxycodone  Itching    Redness   Silicone Itching    Contacts-eyes itching   Vicodin [Hydrocodone-Acetaminophen ] Other (See Comments)    Bad migraine   Zofran  [Ondansetron  Hcl] Itching    Immunization History  Administered Date(s) Administered   DTaP 02/16/1990, 04/20/1990, 11/12/1990, 05/18/1992, 03/07/1994   HIB (PRP-OMP) 04/20/1990, 11/12/1990, 06/07/1991   Hep B, Unspecified 12/28/2003, 01/27/2004, 12/28/2014   Hepatitis B 12/28/2003, 01/27/2004   IPV 02/16/1990, 04/20/1990, 05/18/1992, 03/07/1994   Influenza Split 05/21/2023   Influenza,inj,Quad PF,6+ Mos 05/09/2014, 04/30/2016, 04/18/2017, 04/09/2018, 07/06/2019   Influenza-Unspecified 04/22/2011, 04/27/2015, 04/22/2021, 04/09/2022, 04/23/2022   MMR 03/07/1994, 09/30/1994   Meningococcal Conjugate 10/20/2008   Moderna Sars-Covid-2 Vaccination 02/24/2020, 04/25/2020   PPD Test 09/21/2013, 02/01/2015, 02/22/2015, 03/08/2015   Td 12/28/2003   Tdap 09/21/2013, 10/14/2018    Past Medical History:  Diagnosis Date   Acne    Anxiety    Cancer (HCC)    thyroid    Chest pain    Chronic hypertension    Dyspnea    No longer and issue   Dysthymic disorder    over dose age 84   GERD (gastroesophageal reflux disease)    Gestational diabetes    H/O urinary frequency 06/25/2013   Hypothyroidism    Irregular intermenstrual bleeding 10/12/2013   Lipoma    right scapula   MRSA infection    Obesity    Ovarian cancer (HCC)    Right   Palpitations    PCOS (polycystic ovarian syndrome)    Postpartum depression    Tobacco abuse     Tobacco History: Social History   Tobacco Use  Smoking Status Former   Current packs/day: 1.00   Average packs/day: 1 pack/day for 10.0 years (10.0 ttl pk-yrs)   Types: Cigarettes  Smokeless Tobacco Never  Tobacco Comments   Pt vapes daily    Quit vaping sept 2025   Counseling given: Not Answered Tobacco comments: Pt vapes daily  Quit vaping sept 2025   Outpatient Medications Prior to  Visit  Medication Sig Dispense Refill   acetaminophen  (TYLENOL ) 500 MG tablet Take 500 mg by mouth every 6 (six) hours as needed for moderate pain.     amLODipine  (NORVASC ) 2.5 MG tablet Take 1 tablet by mouth once daily 30 tablet 0   famotidine -calcium  carbonate-magnesium  hydroxide (PEPCID  COMPLETE) 10-800-165 MG chewable tablet Chew 1 tablet by mouth daily as needed (acid reflux).     ibuprofen  (ADVIL ) 200 MG tablet Take 200-400 mg by mouth every 6 (six) hours as needed for moderate pain.     levothyroxine (SYNTHROID) 137 MCG tablet Take 137 mcg by mouth every morning.     Omega-3 Fatty Acids (FISH OIL) 1000 MG CAPS Take by mouth.     tretinoin (RETIN-A) 0.025 % cream Apply topically at bedtime.     ALPRAZolam  (XANAX ) 0.5 MG tablet 1/2 to 1 bid prn panic attack caution drowsiness (Patient not taking: Reported on 04/28/2024) 6 tablet 0   escitalopram  (LEXAPRO ) 10 MG tablet Take 1 tablet (10 mg total) by mouth daily. (Patient not taking: Reported on 04/28/2024) 30 tablet 0   hydrOXYzine  (ATARAX ) 25 MG tablet Take 1 tablet (25 mg total) by mouth 3 (three) times daily as needed. (Patient not  taking: Reported on 04/28/2024) 30 tablet 0   levonorgestrel  (MIRENA ) 20 MCG/24HR IUD 1 each by Intrauterine route once.     No facility-administered medications prior to visit.   Review of Systems  Review of Systems  Constitutional: Negative.   Respiratory: Negative.     Physical Exam  BP 122/72   Pulse 68   Temp 98.6 F (37 C) (Oral)   Ht 5' 4 (1.626 m)   Wt 210 lb 12.8 oz (95.6 kg)   SpO2 98% Comment: on RA  BMI 36.18 kg/m  Physical Exam Constitutional:      General: She is not in acute distress.    Appearance: Normal appearance. She is not ill-appearing.  HENT:     Head: Normocephalic and atraumatic.     Mouth/Throat:     Mouth: Mucous membranes are moist.     Pharynx: Oropharynx is clear.  Cardiovascular:     Rate and Rhythm: Normal rate and regular rhythm.  Pulmonary:     Effort:  Pulmonary effort is normal.     Breath sounds: Normal breath sounds.  Skin:    General: Skin is warm and dry.  Neurological:     General: No focal deficit present.     Mental Status: She is alert and oriented to person, place, and time. Mental status is at baseline.  Psychiatric:        Mood and Affect: Mood normal.        Behavior: Behavior normal.        Thought Content: Thought content normal.        Judgment: Judgment normal.     Lab Results:  CBC    Component Value Date/Time   WBC 7.5 09/13/2023 1629   RBC 4.69 09/13/2023 1629   HGB 14.5 09/13/2023 1629   HGB 15.0 05/21/2023 1640   HCT 42.2 09/13/2023 1629   HCT 45.2 05/21/2023 1640   PLT 327 09/13/2023 1629   PLT 343 05/21/2023 1640   MCV 90.0 09/13/2023 1629   MCV 92 05/21/2023 1640   MCH 30.9 09/13/2023 1629   MCHC 34.4 09/13/2023 1629   RDW 12.1 09/13/2023 1629   RDW 12.4 05/21/2023 1640   LYMPHSABS 2.6 09/13/2023 1629   LYMPHSABS 2.3 05/21/2023 1640   MONOABS 0.6 09/13/2023 1629   EOSABS 0.2 09/13/2023 1629   EOSABS 0.2 05/21/2023 1640   BASOSABS 0.0 09/13/2023 1629   BASOSABS 0.1 05/21/2023 1640    BMET    Component Value Date/Time   NA 140 01/22/2024 1435   K 4.2 01/22/2024 1435   CL 104 01/22/2024 1435   CO2 20 01/22/2024 1435   GLUCOSE 101 (H) 01/22/2024 1435   GLUCOSE 86 09/13/2023 1629   BUN 12 01/22/2024 1435   CREATININE 0.64 01/22/2024 1435   CALCIUM  9.3 01/22/2024 1435   GFRNONAA >60 09/13/2023 1629   GFRAA 136 09/08/2020 0811    BNP No results found for: BNP  ProBNP No results found for: PROBNP  Imaging: No results found.   Assessment & Plan:   1. OSA (obstructive sleep apnea) (Primary) - Ambulatory Referral for DME  Assessment and Plan Assessment & Plan Mild obstructive sleep apnea Mild obstructive sleep apnea with 6.5 apneic episodes per hour and oxygen desaturation to 74%. Symptoms include daytime fatigue, snoring, and witnessed apneic episodes. Current treatment  with CPAP is well-tolerated but causes abdominal fullness, likely due to pressure settings. She is starting Wegovy , which may aid in weight loss and reduce apneic episodes. Minimal risks associated  with mild sleep apnea, allowing for conservative treatment options such as weight loss, positional therapy, oral appliance, CPAP, or ENT referral for surgical interventions. - Adjust CPAP to a set pressure of 13 cm H2O to reduce abdominal fullness and improve comfort.  - Instruct her to monitor symptoms and communicate via MyChart if pressure adjustments are needed. - Allow her to adjust ramp time settings on CPAP for comfort. - Consider formal titration study in sleep lab if home adjustments are insufficient. - Encourage continued use of CPAP and monitor for improvement in snoring and apneic episodes.    Almarie LELON Ferrari, NP 04/28/2024

## 2024-04-28 NOTE — Patient Instructions (Addendum)
  VISIT SUMMARY: Today, you came in for a follow-up on your CPAP management. We discussed your history of mild sleep apnea and your experience with the CPAP machine, including some issues you've been having with abdominal fullness and noise from the machine. We also talked about your new weight management medication, Wegovy , which you are starting today.  YOUR PLAN: -MILD OBSTRUCTIVE SLEEP APNEA: Mild obstructive sleep apnea means you have brief periods during sleep when your breathing stops or becomes very shallow. This can cause symptoms like daytime fatigue, snoring, and waking up gasping for air. We will adjust your CPAP machine to a set pressure of 13 cm H2O to help reduce the abdominal fullness you're experiencing. You should monitor your symptoms and let us  know through MyChart if you need further adjustments. You can also adjust the ramp time settings on your CPAP for added comfort. If these adjustments don't help, we may consider a formal titration study in a sleep lab. Continue using your CPAP and keep an eye on any changes in your snoring and apneic episodes.  INSTRUCTIONS: Please monitor your symptoms and communicate any issues or needed adjustments through MyChart. If the home adjustments to your CPAP machine are not sufficient, we may need to schedule a formal titration study in a sleep lab.  Orders: Change CPAP pressure from auto settings 5-15 to set pressure 13cm h20    Follow-up 6 months with Beth NP or sooner if needed

## 2024-04-30 ENCOUNTER — Ambulatory Visit: Admitting: Nurse Practitioner

## 2024-05-05 ENCOUNTER — Encounter (INDEPENDENT_AMBULATORY_CARE_PROVIDER_SITE_OTHER): Payer: Self-pay | Admitting: Gastroenterology

## 2024-05-07 ENCOUNTER — Encounter: Payer: Self-pay | Admitting: Family Medicine

## 2024-05-07 ENCOUNTER — Ambulatory Visit (INDEPENDENT_AMBULATORY_CARE_PROVIDER_SITE_OTHER): Admitting: Family Medicine

## 2024-05-07 ENCOUNTER — Telehealth: Payer: Self-pay | Admitting: Nurse Practitioner

## 2024-05-07 VITALS — BP 125/88 | HR 86 | Temp 97.9°F | Ht 64.0 in | Wt 204.0 lb

## 2024-05-07 DIAGNOSIS — I10 Essential (primary) hypertension: Secondary | ICD-10-CM | POA: Diagnosis not present

## 2024-05-07 MED ORDER — AMLODIPINE BESYLATE 2.5 MG PO TABS: 2.5000 mg | ORAL_TABLET | Freq: Every day | ORAL | 2 refills | Status: AC

## 2024-05-07 NOTE — Telephone Encounter (Signed)
 Patient came in today for follow up with Dr.Scott medication renewal and she wanted to know if she needed labs done for you from her last visit with you. She stated you spoke on getting them done after taking the new medication you put her on. Also has follow up with you in December  will she need to keep or just see Dr. Glendia in April. Please advise

## 2024-05-07 NOTE — Progress Notes (Unsigned)
   Subjective:    Patient ID: Marissa Lane, female    DOB: April 14, 1991, 33 y.o.   MRN: [id number]  HPI Follow up HTN- refill norvasc    Review of Systems     Objective:   Physical Exam        Assessment & Plan:

## 2024-05-10 ENCOUNTER — Encounter: Payer: Self-pay | Admitting: Nurse Practitioner

## 2024-05-10 ENCOUNTER — Other Ambulatory Visit: Payer: Self-pay | Admitting: Nurse Practitioner

## 2024-05-10 DIAGNOSIS — E0590 Thyrotoxicosis, unspecified without thyrotoxic crisis or storm: Secondary | ICD-10-CM

## 2024-05-10 DIAGNOSIS — E611 Iron deficiency: Secondary | ICD-10-CM

## 2024-05-10 DIAGNOSIS — I10 Essential (primary) hypertension: Secondary | ICD-10-CM

## 2024-05-10 DIAGNOSIS — Z9089 Acquired absence of other organs: Secondary | ICD-10-CM

## 2024-05-10 DIAGNOSIS — Z79899 Other long term (current) drug therapy: Secondary | ICD-10-CM

## 2024-05-10 DIAGNOSIS — E782 Mixed hyperlipidemia: Secondary | ICD-10-CM

## 2024-05-10 DIAGNOSIS — R7989 Other specified abnormal findings of blood chemistry: Secondary | ICD-10-CM

## 2024-05-10 NOTE — Telephone Encounter (Signed)
 My chart message sent

## 2024-06-27 LAB — LIPID PANEL
Chol/HDL Ratio: 5.1 ratio — ABNORMAL HIGH (ref 0.0–4.4)
Cholesterol, Total: 216 mg/dL — ABNORMAL HIGH (ref 100–199)
HDL: 42 mg/dL (ref >39)
LDL Chol Calc (NIH): 155 mg/dL — ABNORMAL HIGH (ref 0–99)
Triglycerides: 105 mg/dL (ref 0–149)
VLDL Cholesterol Cal: 19 mg/dL (ref 5–40)

## 2024-06-27 LAB — CBC WITH DIFFERENTIAL/PLATELET
Basophils Absolute: 0 x10E3/uL (ref 0.0–0.2)
Basos: 1 %
EOS (ABSOLUTE): 0.1 x10E3/uL (ref 0.0–0.4)
Eos: 2 %
Hematocrit: 44.8 % (ref 34.0–46.6)
Hemoglobin: 15 g/dL (ref 11.1–15.9)
Immature Grans (Abs): 0 x10E3/uL (ref 0.0–0.1)
Immature Granulocytes: 0 %
Lymphocytes Absolute: 2.4 x10E3/uL (ref 0.7–3.1)
Lymphs: 39 %
MCH: 29.9 pg (ref 26.6–33.0)
MCHC: 33.5 g/dL (ref 31.5–35.7)
MCV: 89 fL (ref 79–97)
Monocytes Absolute: 0.4 x10E3/uL (ref 0.1–0.9)
Monocytes: 7 %
Neutrophils Absolute: 3.2 x10E3/uL (ref 1.4–7.0)
Neutrophils: 51 %
Platelets: 377 x10E3/uL (ref 150–450)
RBC: 5.02 x10E6/uL (ref 3.77–5.28)
RDW: 12.3 % (ref 11.7–15.4)
WBC: 6.1 x10E3/uL (ref 3.4–10.8)

## 2024-06-27 LAB — HEPATIC FUNCTION PANEL
ALT: 27 IU/L (ref 0–32)
AST: 23 IU/L (ref 0–40)
Albumin: 4.7 g/dL (ref 3.9–4.9)
Alkaline Phosphatase: 77 IU/L (ref 41–116)
Bilirubin Total: 0.3 mg/dL (ref 0.0–1.2)
Bilirubin, Direct: 0.11 mg/dL (ref 0.00–0.40)
Total Protein: 7.7 g/dL (ref 6.0–8.5)

## 2024-06-27 LAB — MICROALBUMIN / CREATININE URINE RATIO
Creatinine, Urine: 322.4 mg/dL
Microalb/Creat Ratio: 26 mg/g{creat} (ref 0–29)
Microalbumin, Urine: 85.2 ug/mL

## 2024-06-27 LAB — TSH: TSH: 0.584 u[IU]/mL (ref 0.450–4.500)

## 2024-06-28 ENCOUNTER — Encounter: Payer: Self-pay | Admitting: Nurse Practitioner

## 2024-06-28 ENCOUNTER — Ambulatory Visit: Admitting: Nurse Practitioner

## 2024-06-28 VITALS — BP 120/87 | HR 89 | Temp 97.3°F | Ht 64.0 in | Wt 195.2 lb

## 2024-06-28 DIAGNOSIS — F419 Anxiety disorder, unspecified: Secondary | ICD-10-CM

## 2024-06-28 DIAGNOSIS — R21 Rash and other nonspecific skin eruption: Secondary | ICD-10-CM

## 2024-06-28 DIAGNOSIS — K1379 Other lesions of oral mucosa: Secondary | ICD-10-CM

## 2024-06-28 DIAGNOSIS — E7800 Pure hypercholesterolemia, unspecified: Secondary | ICD-10-CM

## 2024-06-28 DIAGNOSIS — I10 Essential (primary) hypertension: Secondary | ICD-10-CM

## 2024-06-28 DIAGNOSIS — M2550 Pain in unspecified joint: Secondary | ICD-10-CM

## 2024-06-28 NOTE — Progress Notes (Unsigned)
   Subjective:    Patient ID: Marissa Lane, female    DOB: 1990-08-19, 33 y.o.   MRN: [id number]  HPI Discussed the use of AI scribe software for clinical note transcription with the patient, who gave verbal consent to proceed.  History of Present Illness Marissa Lane is a 33 year old female who presents for a recheck of anxiety and HTN.  She experiences recurrent mouth ulcers in various locations in her mouth, including her lips and gums. These ulcers sometimes resemble canker sores but are less painful. The most recent ulcer was tender and appeared like an abscess. She applied a sample of ozone medication from her dentist, which resolved the ulcer by the next morning.  She has a history of autoimmune issues, with a previous negative ANA test conducted about ten or eleven years ago and a slightly elevated rheumatoid factor from past testing. She experiences leg pain, primarily in her knees, which she attributes to genetics, as her mother and grandmother had similar issues. She also reports episodes of restless legs and neuropathy-like symptoms, which are inconsistent and relieved by standing on a cold floor.  Her current medications include levothyroxine and Synthroid, managed by her endocrinologist, and she occasionally uses Xanax  as needed for anxiety. She is also on Wegovy , prescribed by her endocrinologist, and reports mild side effects but no significant nausea. She is on a low dose of amlodipine  for blood pressure management.  She has been experiencing recurrent facial rashes. Has pictures on her phone erythematous covering most of her face except eye area. Faint erythema except for after a hot bath. Non pruritic and non tender. Also has recurrent rashes always in the same areas on certain tattoos.      Review of Systems     Objective:   Physical Exam        Assessment & Plan:

## 2024-06-29 DIAGNOSIS — K1379 Other lesions of oral mucosa: Secondary | ICD-10-CM | POA: Insufficient documentation

## 2024-06-29 DIAGNOSIS — E7800 Pure hypercholesterolemia, unspecified: Secondary | ICD-10-CM | POA: Insufficient documentation

## 2024-07-02 ENCOUNTER — Ambulatory Visit: Payer: Self-pay | Admitting: Nurse Practitioner

## 2024-07-02 LAB — RHEUMATOID FACTOR: Rheumatoid fact SerPl-aCnc: 11.7 [IU]/mL (ref <14.0)

## 2024-07-02 LAB — ANTI-CCP AB, IGG + IGA (RDL): Anti-CCP Ab, IgG + IgA (RDL): <20 U (ref <20)

## 2024-07-02 LAB — ANA W/REFLEX IF POSITIVE: Anti Nuclear Antibody (ANA): NEGATIVE

## 2024-07-02 LAB — C-REACTIVE PROTEIN: CRP: <1 mg/L (ref 0–10)

## 2024-11-05 ENCOUNTER — Ambulatory Visit: Admitting: Family Medicine
